# Patient Record
Sex: Male | Born: 1955
Health system: Southern US, Community
[De-identification: ages and names within clinical notes are randomized; demographics above are authoritative.]

## PROBLEM LIST (undated history)

## (undated) DIAGNOSIS — K265 Chronic or unspecified duodenal ulcer with perforation: Secondary | ICD-10-CM

## (undated) DIAGNOSIS — I2510 Atherosclerotic heart disease of native coronary artery without angina pectoris: Secondary | ICD-10-CM

## (undated) DIAGNOSIS — IMO0001 Reserved for inherently not codable concepts without codable children: Secondary | ICD-10-CM

## (undated) DIAGNOSIS — I209 Angina pectoris, unspecified: Secondary | ICD-10-CM

## (undated) DIAGNOSIS — F1410 Cocaine abuse, uncomplicated: Secondary | ICD-10-CM

## (undated) DIAGNOSIS — E7800 Pure hypercholesterolemia, unspecified: Secondary | ICD-10-CM

## (undated) DIAGNOSIS — I219 Acute myocardial infarction, unspecified: Secondary | ICD-10-CM

## (undated) DIAGNOSIS — Z720 Tobacco use: Secondary | ICD-10-CM

## (undated) DIAGNOSIS — K219 Gastro-esophageal reflux disease without esophagitis: Secondary | ICD-10-CM

## (undated) DIAGNOSIS — J449 Chronic obstructive pulmonary disease, unspecified: Secondary | ICD-10-CM

## (undated) DIAGNOSIS — Z8719 Personal history of other diseases of the digestive system: Secondary | ICD-10-CM

## (undated) DIAGNOSIS — Z872 Personal history of diseases of the skin and subcutaneous tissue: Secondary | ICD-10-CM

## (undated) DIAGNOSIS — I10 Essential (primary) hypertension: Secondary | ICD-10-CM

## (undated) HISTORY — DX: Atherosclerotic heart disease of native coronary artery without angina pectoris: I25.10

## (undated) HISTORY — PX: OTHER SURGICAL HISTORY: SHX169

---

## 1998-05-26 ENCOUNTER — Emergency Department (HOSPITAL_COMMUNITY): Admission: EM | Admit: 1998-05-26 | Discharge: 1998-05-26 | Payer: Self-pay | Admitting: Emergency Medicine

## 1998-05-26 ENCOUNTER — Encounter: Payer: Self-pay | Admitting: Emergency Medicine

## 1998-05-29 ENCOUNTER — Ambulatory Visit (HOSPITAL_BASED_OUTPATIENT_CLINIC_OR_DEPARTMENT_OTHER): Admission: RE | Admit: 1998-05-29 | Discharge: 1998-05-29 | Payer: Self-pay | Admitting: Orthopedic Surgery

## 2008-11-15 ENCOUNTER — Emergency Department (HOSPITAL_COMMUNITY): Admission: EM | Admit: 2008-11-15 | Discharge: 2008-11-15 | Payer: Self-pay | Admitting: Emergency Medicine

## 2010-07-18 ENCOUNTER — Emergency Department (HOSPITAL_COMMUNITY): Payer: Self-pay

## 2010-07-18 ENCOUNTER — Inpatient Hospital Stay (HOSPITAL_COMMUNITY)
Admission: EM | Admit: 2010-07-18 | Discharge: 2010-07-20 | DRG: 192 | Disposition: A | Discharge status: Home / Self Care | Payer: Self-pay | Attending: Internal Medicine | Admitting: Internal Medicine

## 2010-07-18 DIAGNOSIS — R062 Wheezing: Secondary | ICD-10-CM | POA: Diagnosis present

## 2010-07-18 DIAGNOSIS — F17200 Nicotine dependence, unspecified, uncomplicated: Secondary | ICD-10-CM | POA: Diagnosis present

## 2010-07-18 DIAGNOSIS — J441 Chronic obstructive pulmonary disease with (acute) exacerbation: Principal | ICD-10-CM | POA: Diagnosis present

## 2010-07-18 DIAGNOSIS — R0602 Shortness of breath: Secondary | ICD-10-CM | POA: Diagnosis present

## 2010-07-18 LAB — POCT CARDIAC MARKERS
CKMB, poc: 10.6 ng/mL (ref 1.0–8.0)
Myoglobin, poc: 152 ng/mL (ref 12–200)
Troponin i, poc: <0.05 ng/mL (ref 0.00–0.09)

## 2010-07-18 LAB — DIFFERENTIAL
Basophils Absolute: 0 10*3/uL (ref 0.0–0.1)
Basophils Relative: 1 % (ref 0–1)
Eosinophils Absolute: 0.7 10*3/uL (ref 0.0–0.7)
Eosinophils Relative: 9 % — ABNORMAL HIGH (ref 0–5)
Lymphocytes Relative: 21 % (ref 12–46)
Lymphs Abs: 1.7 10*3/uL (ref 0.7–4.0)
Monocytes Absolute: 0.4 10*3/uL (ref 0.1–1.0)
Monocytes Relative: 5 % (ref 3–12)
Neutro Abs: 5.3 10*3/uL (ref 1.7–7.7)
Neutrophils Relative %: 65 % (ref 43–77)

## 2010-07-18 LAB — BASIC METABOLIC PANEL
BUN: 7 mg/dL (ref 6–23)
CO2: 25 mEq/L (ref 19–32)
Chloride: 103 mEq/L (ref 96–112)
Potassium: 3.8 mEq/L (ref 3.5–5.1)

## 2010-07-18 LAB — CBC
HCT: 43.1 % (ref 39.0–52.0)
Hemoglobin: 14.8 g/dL (ref 13.0–17.0)
MCH: 29.6 pg (ref 26.0–34.0)
MCHC: 34.3 g/dL (ref 30.0–36.0)
MCV: 86.2 fL (ref 78.0–100.0)
RDW: 14.5 % (ref 11.5–15.5)

## 2010-07-18 LAB — URINALYSIS, ROUTINE W REFLEX MICROSCOPIC
Bilirubin Urine: NEGATIVE
Hgb urine dipstick: NEGATIVE
Ketones, ur: NEGATIVE mg/dL
Protein, ur: 30 mg/dL — AB
Urine Glucose, Fasting: NEGATIVE mg/dL
Urobilinogen, UA: 0.2 mg/dL (ref 0.0–1.0)

## 2010-07-18 LAB — CK TOTAL AND CKMB (NOT AT ARMC): Total CK: 391 U/L — ABNORMAL HIGH (ref 7–232)

## 2010-07-18 LAB — TROPONIN I: Troponin I: <0.01 ng/mL (ref 0.00–0.06)

## 2010-07-18 LAB — URINE MICROSCOPIC-ADD ON

## 2010-07-19 LAB — CARDIAC PANEL(CRET KIN+CKTOT+MB+TROPI)
Relative Index: 5.2 — ABNORMAL HIGH (ref 0.0–2.5)
Total CK: 157 U/L (ref 7–232)
Troponin I: 0.01 ng/mL (ref 0.00–0.06)

## 2010-07-26 NOTE — Discharge Summary (Signed)
  NAMECRISTON, [name]              ACCOUNT NO.:  [id number]  MEDICAL RECORD NO.:  [id number]           PATIENT TYPE:  I  LOCATION:  1408                         FACILITY:  [facility]  PHYSICIAN:  Osvaldo Shipper, MD     DATE OF BIRTH:  1955/10/11  DATE OF ADMISSION:  07/18/2010 DATE OF DISCHARGE:  07/20/2010                              DISCHARGE SUMMARY   The patient does not have a primary care physician.  No consultations obtained during this admission.  Imaging studies done include a chest x-ray which showed no active disease and showed mild hyperinflation.  PERTINENT LABS:  The CBC was normal.  His BMET was unremarkable. Initially when he came in, he had a total CK of 391 with CK-MB of 16. Troponins have all been negative.  CK-MB did come down to 8.2 with a normal CK.  DISCHARGE DIAGNOSES: 1. Acute chronic obstructive pulmonary disease exacerbation, improved. 2. Tobacco abuse.  BRIEF HOSPITAL COURSE:  Briefly, this is a 55 year old African-American male who presented to the hospital with complaints of wheezing and shortness of breath and was identified as having COPD exacerbation.  The patient was admitted to the hospital for further workup and evaluation. Chest x-ray did not show any pneumonia.  He was afebrile.  He was put on steroids, antibiotics, nebulizer treatments.  Advair was added subsequently.  He has shown significant improvement.  He is ambulated with no difficulties now.  He is saturating 92% on room air.    On examination today, the patient's lungs are clear to auscultation.  No wheezing, rales or rhonchi.  Cardiovascular, S1, S2 is normal regular. No S3, S4, rubs, murmurs, or bruit.  Abdomen is soft, nontender, nondistended.  Bowel sounds are present.  No masses or organomegaly appreciated.  Rest of the exam was unremarkable.  The patient does not have any complaints today.  He is keen on going home and he is considered stable for discharge.  DISCHARGE  MEDICATIONS: 1. Advair 250/50 inhale b.i.d. 2. Combivent inhaler 2 puffs inhale 3 times a day. 3. Moxifloxacin 400 mg p.o. daily for 4 days. 4. Nicotine patch 40 mg patch daily. 5. Prednisone taper as recommended and  6. he is on a Primatene inhaler as needed every 4 hours.  FOLLOWUP:  He has been given phone numbers for primary care providers which he can call.  He has been strongly advised to quit smoking.  DIET:  Heart-healthy.  PHYSICAL ACTIVITY:  As before without any exertion.  He may actually return to work tomorrow.  He will be given a note for work as well.     Osvaldo Shipper, MD     GK/MEDQ  D:  07/20/2010  T:  07/20/2010  Job:  [id number]  Electronically Signed by Osvaldo Shipper MD on 07/26/2010 07:49:45 PM

## 2010-08-10 NOTE — H&P (Signed)
[name], [name]              ACCOUNT NO.:  [id number]  MEDICAL RECORD NO.:  [id number]           PATIENT TYPE:  E  LOCATION:  WLED                         FACILITY:  [facility]  PHYSICIAN:  Gordy Savers, MDDATE OF BIRTH:  1955-08-29  DATE OF ADMISSION:  07/18/2010 DATE OF DISCHARGE:                             HISTORY & PHYSICAL   CHIEF COMPLAINT:  Wheezing and shortness of breath.  HISTORY OF PRESENT ILLNESS:  The patient is a 55 year old African- American male, who has a history of COPD.  This was a radiographic diagnosis based on a preemployment chest x-ray and he has never been on any maintenance pulmonary medications.  He is a one-half pack per day smoker, down from his chronic one pack per day use.  He was in his usual health until 3 days ago when he developed URI symptoms with chest congestion and cough.  Cough was initially nonproductive, but became productive of white, thick sputum and more recently over the past 24 hours, thick yellow secretions.  He has felt unwell, but was stable until this morning when he awoke wheezing and short of breath.  He became much more short of breath with wheezing when he attempted to ambulate and EMS was called.  He was treated on site with Solu-Medrol and nebulizer treatment and improved.  He was subsequently transferred to the ED for further management.  In the emergency room, he was treated with continuous nebulizer treatments with albuterol and did improve. Evaluation included cardiac markers that revealed persistent elevation in CK-MB, but normal troponin levels.  The patient does admit to cocaine use approximately 2 days prior.  After evaluation and treatment in the ED setting, the patient remained symptomatic with audible expiratory wheezing.  He is now admitted for further evaluation and management of his exacerbation of COPD as well as observation due to his elevated cardiac markers.  PAST MEDICAL HISTORY:  The patient  has a history of COPD, but has not required treatment.  He has a history of peptic ulcer disease complicated by bleeding and underwent unclear surgery in [place] in the 1970s.  He has had no subsequent admissions.  PAST SURGICAL HISTORY:  Denies any prior surgery.  MEDICAL REGIMEN:  None.  FAMILY HISTORY:  Fairly noncontributory.  Mother is age 52, history of dementia.  Father died at age 54 of lung cancer.  Two brothers, one deceased from complications of AIDS.  One sister is well.  SOCIAL HISTORY:  He is a one-half pack per day smoker.  Substance abuse includes cocaine and cannabis.  He works as a Counsellor.  He is single and never has been married.  He normally walks three to four miles three times per week without history of exertional chest pain or dyspnea.  DRUG ALLERGIES:  None.  REVIEW OF SYSTEMS:  CONSTITUTIONAL:  Denies any change in weight, fever or chills. HEENT EXAMINATION:  Positive for dentures.  No visual or hearing deficits.  No swallowing difficulties. PULMONARY:  See history of present illness.  He has developed wheezing, productive cough associated with the recent URI.  He is a one-half  pack per day smoker. CARDIAC:  No family history or personal history of cardiac disease. Denies any exertional chest pain.  Has admitted to recent cocaine use. GI:  History of peptic ulcer disease complicated by bleeding and is status post operative intervention in the 1970s. SKIN:  Unremarkable without rash. NEUROLOGICAL:  No focal symptoms.  PHYSICAL EXAMINATION:  VITAL SIGNS:  Temperature 98.2, pulse 85, respiratory rate 20, O2 saturation 95%-100%. SKIN:  Warm and dry without rash. GENERAL EXAMINATION:  Revealed a well-developed, thin male, who appeared to be in no acute distress at rest. HEENT EXAMINATION:  Revealed normal pupil responses.  Conjunctiva clear. ENT unremarkable.  Dentures in place. NECK:  Revealed no adenopathy, neck vein  distention or bruits. CHEST:  Revealed audible expiratory wheezing left greater than the right.  There is no increased work of breathing or distress. CARDIOVASCULAR EXAMINATION:  Revealed normal S1, S2 without murmur.  No tachycardia. ABDOMEN:  Midline scar.  No organomegaly or distention.  No tenderness. EXTERNAL GENITALIA:  Normal. EXTREMITIES:  Revealed no edema.  Peripheral pulses were full. NEUROLOGIC EXAMINATION:  Revealed him to be alert and oriented with normal speech.  Motor exam was normal.  Gait was normal.  LABORATORY DATA:  Laboratory studies revealed a normal white count of 8.2, hemoglobin 14.8, hematocrit 43.1.  Urinalysis normal.  Chemistries revealed normal findings.  Random blood sugar 126.  Two sets of CK levels were elevated with a total CK of 391, CK-MB 16.3 with a relative index of 4.2.  Troponin levels x2 normal.  DIAGNOSTIC STUDIES:  Chest x-ray revealed mild hyperinflation and no active disease.  EKG normal.  IMPRESSION: 1. Acute exacerbation of chronic obstructive pulmonary disease. 2. Elevated CK-MB with normal troponin levels.  This is consistent     with recent cocaine abuse. 3. Tobacco and substance abuse.  DISPOSITION:  The patient will be admitted to hospital.  He will be treated with aggressive nebulizer treatments and continue today on parenteral Solu-Medrol.  He will be treated with oral azithromycin. Tobacco and substance abuse consultation will be obtained.  Followup cardiac markers and EKG will be reviewed in the morning.     Gordy Savers, MD     PFK/MEDQ  D:  07/18/2010  T:  07/18/2010  Job:  [id number]  Electronically Signed by Eleonore Chiquito MD on 08/10/2010 12:27:05 PM

## 2011-05-18 ENCOUNTER — Encounter: Payer: Self-pay | Admitting: *Deleted

## 2011-05-18 ENCOUNTER — Inpatient Hospital Stay (HOSPITAL_COMMUNITY)
Admission: EM | Admit: 2011-05-18 | Discharge: 2011-05-23 | DRG: 192 | Disposition: A | Discharge status: Home / Self Care | Payer: Self-pay | Attending: Family Medicine | Admitting: Family Medicine

## 2011-05-18 ENCOUNTER — Emergency Department (HOSPITAL_COMMUNITY): Payer: Self-pay

## 2011-05-18 DIAGNOSIS — R509 Fever, unspecified: Secondary | ICD-10-CM

## 2011-05-18 DIAGNOSIS — Z720 Tobacco use: Secondary | ICD-10-CM | POA: Diagnosis present

## 2011-05-18 DIAGNOSIS — IMO0001 Reserved for inherently not codable concepts without codable children: Secondary | ICD-10-CM | POA: Diagnosis present

## 2011-05-18 DIAGNOSIS — J09X2 Influenza due to identified novel influenza A virus with other respiratory manifestations: Secondary | ICD-10-CM | POA: Diagnosis present

## 2011-05-18 DIAGNOSIS — J209 Acute bronchitis, unspecified: Secondary | ICD-10-CM | POA: Diagnosis present

## 2011-05-18 DIAGNOSIS — R0602 Shortness of breath: Secondary | ICD-10-CM | POA: Diagnosis present

## 2011-05-18 DIAGNOSIS — R0989 Other specified symptoms and signs involving the circulatory and respiratory systems: Secondary | ICD-10-CM | POA: Diagnosis present

## 2011-05-18 DIAGNOSIS — J441 Chronic obstructive pulmonary disease with (acute) exacerbation: Principal | ICD-10-CM | POA: Diagnosis present

## 2011-05-18 DIAGNOSIS — K279 Peptic ulcer, site unspecified, unspecified as acute or chronic, without hemorrhage or perforation: Secondary | ICD-10-CM | POA: Diagnosis present

## 2011-05-18 DIAGNOSIS — R0902 Hypoxemia: Secondary | ICD-10-CM

## 2011-05-18 DIAGNOSIS — J101 Influenza due to other identified influenza virus with other respiratory manifestations: Secondary | ICD-10-CM | POA: Diagnosis present

## 2011-05-18 DIAGNOSIS — J449 Chronic obstructive pulmonary disease, unspecified: Secondary | ICD-10-CM

## 2011-05-18 DIAGNOSIS — F17200 Nicotine dependence, unspecified, uncomplicated: Secondary | ICD-10-CM | POA: Diagnosis present

## 2011-05-18 DIAGNOSIS — R0609 Other forms of dyspnea: Secondary | ICD-10-CM | POA: Diagnosis present

## 2011-05-18 HISTORY — DX: Chronic obstructive pulmonary disease, unspecified: J44.9

## 2011-05-18 HISTORY — DX: Chronic or unspecified duodenal ulcer with perforation: K26.5

## 2011-05-18 HISTORY — DX: Personal history of diseases of the skin and subcutaneous tissue: Z87.2

## 2011-05-18 HISTORY — DX: Personal history of other diseases of the digestive system: Z87.19

## 2011-05-18 LAB — CBC
HCT: 42 % (ref 39.0–52.0)
MCHC: 35.7 g/dL (ref 30.0–36.0)
MCV: 84.3 fL (ref 78.0–100.0)
Platelets: 360 10*3/uL (ref 150–400)
RDW: 14.4 % (ref 11.5–15.5)

## 2011-05-18 LAB — DIFFERENTIAL
Basophils Absolute: 0 10*3/uL (ref 0.0–0.1)
Basophils Relative: 0 % (ref 0–1)
Eosinophils Relative: 0 % (ref 0–5)
Monocytes Absolute: 0.8 10*3/uL (ref 0.1–1.0)
Neutro Abs: 7 10*3/uL (ref 1.7–7.7)

## 2011-05-18 LAB — BASIC METABOLIC PANEL
Calcium: 9.2 mg/dL (ref 8.4–10.5)
Creatinine, Ser: 1.35 mg/dL (ref 0.50–1.35)
GFR calc Af Amer: 66 mL/min — ABNORMAL LOW (ref >90)

## 2011-05-18 MED ORDER — IPRATROPIUM BROMIDE 0.02 % IN SOLN
RESPIRATORY_TRACT | Status: AC
  Administered 2011-05-18: 19:00:00
  Filled 2011-05-18: qty 2.5

## 2011-05-18 MED ORDER — ACETAMINOPHEN 325 MG PO TABS
650.0000 mg | ORAL_TABLET | Freq: Once | ORAL | Status: AC
  Administered 2011-05-19: 650 mg via ORAL
  Filled 2011-05-18: qty 1

## 2011-05-18 MED ORDER — ALBUTEROL SULFATE (5 MG/ML) 0.5% IN NEBU
INHALATION_SOLUTION | RESPIRATORY_TRACT | Status: AC
  Filled 2011-05-18: qty 1

## 2011-05-18 MED ORDER — ALBUTEROL SULFATE (5 MG/ML) 0.5% IN NEBU
INHALATION_SOLUTION | RESPIRATORY_TRACT | Status: AC
  Administered 2011-05-18: 10 mg/h via RESPIRATORY_TRACT
  Filled 2011-05-18: qty 2

## 2011-05-18 MED ORDER — SODIUM CHLORIDE 0.9 % IV BOLUS (SEPSIS)
1000.0000 mL | Freq: Once | INTRAVENOUS | Status: AC
  Administered 2011-05-19: 1000 mL via INTRAVENOUS

## 2011-05-18 MED ORDER — METHYLPREDNISOLONE SODIUM SUCC 125 MG IJ SOLR
125.0000 mg | Freq: Once | INTRAMUSCULAR | Status: AC
  Administered 2011-05-18: 125 mg via INTRAVENOUS
  Filled 2011-05-18: qty 2

## 2011-05-18 MED ORDER — ALBUTEROL (5 MG/ML) CONTINUOUS INHALATION SOLN
10.0000 mg/h | INHALATION_SOLUTION | Freq: Once | RESPIRATORY_TRACT | Status: AC
  Administered 2011-05-18: 10 mg/h via RESPIRATORY_TRACT

## 2011-05-18 NOTE — ED Notes (Signed)
Continues to have borderline o2 sat levels @ 93 on 2 l/min.   Patient sats that this is the first time that he is not broken his RDS with the HHN's.  Lungs still with scattered rhonchi

## 2011-05-18 NOTE — ED Notes (Signed)
Continues to wait for room assignment.  Tylenol and bolus of saline given as ordered.  Breathing much better at present time

## 2011-05-18 NOTE — ED Notes (Signed)
Flu swab obtained and sent.

## 2011-05-18 NOTE — H&P (Addendum)
PCP:   None  Chief Complaint:  Shortness of breath  HPI: This is a 55 year old gentleman with known history of COPD and tobacco use, he states he woke this morning he was short of breath. He went to work the symptoms continued. He was wheezing, he was coughing. His cough is nonproductive. He reports chills and subjective fever. He was diaphoretic. He was nausea and vomiting, no hematemesis. He states he had a single episode of bowel incontinence. No abdominal pain, no altered mental status. 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 During my interview patient was short of breath using his abdominal sister muscles. He states he feels somewhat improved but still quite dyspneic.  Review of Systems: Positives bolded The patient denies anorexia, fever, weight loss,, vision loss, decreased hearing, hoarseness, chest pain, syncope, dyspnea on exertion, peripheral edema, balance deficits, hemoptysis, abdominal pain, melena, hematochezia, severe indigestion/heartburn, hematuria, incontinence, genital sores, muscle weakness, suspicious skin lesions, transient blindness, difficulty walking, depression, unusual weight change, abnormal bleeding, enlarged lymph nodes, angioedema, and breast masses.  Past Medical History: Past Medical History  Diagnosis Date  . COPD (chronic obstructive pulmonary disease)   . Stomach ulcer    Past Surgical History  Procedure Date  . Stomach sx     Medications: Prior to Admission medications   Medication Sig Start Date End Date Taking? Authorizing Provider  Fluticasone-Salmeterol (ADVAIR) 250-50 MCG/DOSE AEPB Inhale 1 puff into the lungs every 12 (twelve) hours.     Yes Historical Provider, MD    Allergies:  No Known Allergies  Social History:  reports that he has been smoking Cigarettes.  He has a 24 pack-year smoking history. He does not have any smokeless tobacco history on file. He reports that he drinks alcohol. He reports that he does not use  illicit drugs.  Family History: Family History  Problem Relation Age of Onset  . Lung cancer      Physical Exam: Filed Vitals:   05/18/11 1828 05/18/11 1859 05/18/11 1919 05/18/11 1950  BP:    151/74  Pulse:    131  Temp:    99.8 F (37.7 C)  TempSrc:    Oral  Resp:    19  SpO2: 97% 97% 94% 94%    General:  Alert and oriented times three, well developed and nourished, short of breath Eyes: PERRLA, pink conjunctiva, no scleral icterus ENT: Moist oral mucosa, neck supple, no thyromegaly, dentures  Lungs: clear to ascultation, generalized wheezing, no crackles, using abdominal accessory muscles Cardiovascular: regular rate and rhythm, no regurgitation, no gallops, no murmurs. No carotid bruits, no JVD Abdomen: soft, positive BS, non-tender, non-distended, no organomegaly, not an acute abdomen GU: not examined Neuro: CN II - XII grossly intact, sensation intact Musculoskeletal: strength 5/5 all extremities, no clubbing, cyanosis or edema Skin: no rash, no subcutaneous crepitation, no decubitus Psych: appropriate patient   Labs on Admission:   Basename 05/18/11 2030  NA 134*  K 3.6  CL 98  CO2 20  GLUCOSE 151*  BUN 15  CREATININE 1.35  CALCIUM 9.2  MG --  PHOS --   No results found for this basename: AST:2,ALT:2,ALKPHOS:2,BILITOT:2,PROT:2,ALBUMIN:2 in the last 72 hours No results found for this basename: LIPASE:2,AMYLASE:2 in the last 72 hours  Basename 05/18/11 1848  WBC 9.0  NEUTROABS 7.0  HGB 15.0  HCT 42.0  MCV 84.3  PLT 360   No results found for this basename: CKTOTAL:3,CKMB:3,CKMBINDEX:3,TROPONINI:3 in the last 72 hours No results  found for this basename: TSH,T4TOTAL,FREET3,T3FREE,THYROIDAB in the last 72 hours No results found for this basename: VITAMINB12:2,FOLATE:2,FERRITIN:2,TIBC:2,IRON:2,RETICCTPCT:2 in the last 72 hours  Radiological Exams on Admission: Dg Chest 2 View  05/18/2011  *RADIOLOGY REPORT*  Clinical Data: Chest pain and shortness of  breath.  CHEST - 2 VIEW  Comparison: Two-view chest 07/18/2010.  Findings: The heart size is normal.  Lungs are clear. Emphysematous changes are again noted.  No focal airspace disease is evident.  The visualized soft tissues and bony thorax are unremarkable.  IMPRESSION:  1.  No acute cardiopulmonary disease. 2.  Stable emphysema.  Original Report Authenticated By: Jamesetta Orleans. MATTERN, M.D.   EKG: NSR  Assessment/Plan Present on Admission:  .COPD with acute exacerbation Tobacco abuse Admit to step down overnight Oxygen ordered Solu-Medrol, nebulizers ordered  Nicotine patch Tobacco cessation education Empiric antibiotics since patient was febrile, sputum cultures ordered. MAXIMUM TEMPERATURE 101.2. Patient being evaluated for influenza, results pending. Peptic ulcer disease Protonix daily   Full code DVT prophylaxis Team 2/Dr. Darnelle Catalan   Time in 9:50 PM  Time out 10:19 PM   Anjela Cassara 05/18/2011, 10:09 PM

## 2011-05-18 NOTE — ED Provider Notes (Signed)
History     CSN: [id number] Arrival date & time: 05/18/2011  6:18 PM   First MD Initiated Contact with Patient 05/18/11 1847      Chief Complaint  Patient presents with  . Shortness of Breath    COPD exacerbation    (Consider location/radiation/quality/duration/timing/severity/associated sxs/prior treatment) HPI Comments: Patient presented today complaining of worsening shortness of breath since this morning and increasing cough.  Patient has a known history of COPD and has decreased his smoking but does continue to smoke approximately 5 cigarettes per day.  He has noted that he's had increasing cough and fevers at home.  He comes in today because he is out of his inhaler was not improving.  He does note one prior admission for COPD but no intubations.  Patient denies any chest pain, nausea, vomiting at this time.  Patient is a 55 y.o. male presenting with shortness of breath. The history is provided by the patient.  Shortness of Breath  The current episode started today. The onset was gradual. The problem occurs continuously. The problem has been gradually worsening. The problem is severe. The symptoms are relieved by nothing. The symptoms are aggravated by activity. Associated symptoms include a fever, cough, shortness of breath and wheezing. Pertinent negatives include no chest pain and no orthopnea.    Past Medical History  Diagnosis Date  . COPD (chronic obstructive pulmonary disease)   . Stomach ulcer     History reviewed. No pertinent past surgical history.  History reviewed. No pertinent family history.  History  Substance Use Topics  . Smoking status: Current Everyday Smoker -- 0.6 packs/day for 40 years    Types: Cigarettes  . Smokeless tobacco: Not on file  . Alcohol Use: Yes     Occassionally      Review of Systems  Constitutional: Positive for fever. Negative for chills.  HENT: Negative.   Eyes: Negative.  Negative for discharge and redness.  Respiratory:  Positive for cough, shortness of breath and wheezing.   Cardiovascular: Negative.  Negative for chest pain and orthopnea.  Gastrointestinal: Negative.  Negative for nausea, vomiting and abdominal pain.  Genitourinary: Negative.  Negative for hematuria.  Musculoskeletal: Negative.  Negative for back pain.  Skin: Negative.  Negative for color change and rash.  Neurological: Negative for syncope and headaches.  Hematological: Negative.  Negative for adenopathy.  Psychiatric/Behavioral: Negative.  Negative for confusion.  All other systems reviewed and are negative.    Allergies  Review of patient's allergies indicates no known allergies.  Home Medications   Current Outpatient Rx  Name Route Sig Dispense Refill  . FLUTICASONE-SALMETEROL 250-50 MCG/DOSE IN AEPB Inhalation Inhale 1 puff into the lungs every 12 (twelve) hours.        BP 151/74  Pulse 131  Temp(Src) 99.8 F (37.7 C) (Oral)  Resp 19  SpO2 94%  Physical Exam  Constitutional: He is oriented to person, place, and time. He appears well-developed and well-nourished.  Non-toxic appearance. He does not have a sickly appearance.  HENT:  Head: Normocephalic and atraumatic.  Eyes: Conjunctivae, EOM and lids are normal. Pupils are equal, round, and reactive to light.  Neck: Trachea normal, normal range of motion and full passive range of motion without pain. Neck supple.  Cardiovascular: Regular rhythm and normal heart sounds.  Tachycardia present.  Exam reveals no gallop and no friction rub.   No murmur heard. Pulmonary/Chest: No respiratory distress. He has decreased breath sounds. He has wheezes. He has no rales.  Patient with inspiratory and expiratory wheezing present on initial exam with significantly decreased breath sounds bilaterally.  Abdominal: Soft. Normal appearance. He exhibits no distension. There is no tenderness. There is no rebound and no CVA tenderness.  Musculoskeletal: Normal range of motion.    Neurological: He is alert and oriented to person, place, and time. He has normal strength.  Skin: Skin is warm, dry and intact. No rash noted.  Psychiatric: He has a normal mood and affect. His behavior is normal. Judgment and thought content normal.    ED Course  Procedures (including critical care time)  Labs Reviewed  DIFFERENTIAL - Abnormal; Notable for the following:    Neutrophils Relative 78 (*)    All other components within normal limits  BASIC METABOLIC PANEL - Abnormal; Notable for the following:    Sodium 134 (*)    Glucose, Bld 151 (*)    GFR calc non Af Amer 57 (*)    GFR calc Af Amer 66 (*)    All other components within normal limits  CBC  INFLUENZA PANEL BY PCR   Dg Chest 2 View  05/18/2011  *RADIOLOGY REPORT*  Clinical Data: Chest pain and shortness of breath.  CHEST - 2 VIEW  Comparison: Two-view chest 07/18/2010.  Findings: The heart size is normal.  Lungs are clear. Emphysematous changes are again noted.  No focal airspace disease is evident.  The visualized soft tissues and bony thorax are unremarkable.  IMPRESSION:  1.  No acute cardiopulmonary disease. 2.  Stable emphysema.  Original Report Authenticated By: Jamesetta Orleans. MATTERN, M.D.     No diagnosis found.    MDM  Patient with COPD exacerbation and possible influenza as cause for his cough and fevers.  On reevaluation after an hour-long continuous neb treatment patient is slightly improved but still has significant intertrigo return wheezing on exam and still notes that he is subjectively short of breath.  Patient is also still tachycardic to approximately 110 to 115.  His temperature is starting to decrease but is still mildly elevated.  Patient's been given solu Medrol for steroid.  He is maintaining his oxygen saturations in the mid-90s on 2 L by nasal cannula.  Given the patient's clear chest x-ray has not been given antibiotics and an influenza test has been ordered.  I discussed this with the triad  hospitalist to admit the patient to team 2 which is covered by Dr. Darnelle Catalan.        Nat Christen, MD 05/18/11 2152

## 2011-05-18 NOTE — ED Notes (Signed)
Pt states that he has COPD and ran out of his inhaler today. States that he started experiencing sx at approximately 0700 this morning. Patient with [facility] and difficulty breathing. His initial O2 saturations were 90% on RA. Patient is now on 2 liters O2 and O2 sats at 96%. Patient with inspiratory and expiratory wheezes at this time.

## 2011-05-19 ENCOUNTER — Encounter (HOSPITAL_COMMUNITY): Payer: Self-pay

## 2011-05-19 DIAGNOSIS — J101 Influenza due to other identified influenza virus with other respiratory manifestations: Secondary | ICD-10-CM | POA: Diagnosis present

## 2011-05-19 DIAGNOSIS — Z720 Tobacco use: Secondary | ICD-10-CM | POA: Diagnosis present

## 2011-05-19 LAB — CBC
Hemoglobin: 14.6 g/dL (ref 13.0–17.0)
RBC: 4.78 MIL/uL (ref 4.22–5.81)
WBC: 7.6 10*3/uL (ref 4.0–10.5)

## 2011-05-19 LAB — CREATININE, SERUM
Creatinine, Ser: 1.11 mg/dL (ref 0.50–1.35)
GFR calc Af Amer: 83 mL/min — ABNORMAL LOW (ref >90)
GFR calc non Af Amer: 72 mL/min — ABNORMAL LOW (ref >90)

## 2011-05-19 LAB — INFLUENZA PANEL BY PCR (TYPE A & B): Influenza A By PCR: POSITIVE — AB

## 2011-05-19 LAB — URINALYSIS, ROUTINE W REFLEX MICROSCOPIC
Glucose, UA: NEGATIVE mg/dL
Protein, ur: 100 mg/dL — AB

## 2011-05-19 LAB — URINE MICROSCOPIC-ADD ON

## 2011-05-19 MED ORDER — ENOXAPARIN SODIUM 40 MG/0.4ML ~~LOC~~ SOLN
40.0000 mg | SUBCUTANEOUS | Status: DC
  Administered 2011-05-19 – 2011-05-23 (×5): 40 mg via SUBCUTANEOUS
  Filled 2011-05-19 (×5): qty 0.4

## 2011-05-19 MED ORDER — NICOTINE 14 MG/24HR TD PT24
14.0000 mg | MEDICATED_PATCH | Freq: Every day | TRANSDERMAL | Status: DC
  Administered 2011-05-19 – 2011-05-23 (×5): 14 mg via TRANSDERMAL
  Filled 2011-05-19 (×5): qty 1

## 2011-05-19 MED ORDER — IPRATROPIUM BROMIDE 0.02 % IN SOLN
0.5000 mg | RESPIRATORY_TRACT | Status: DC | PRN
  Administered 2011-05-19 (×4): 0.5 mg via RESPIRATORY_TRACT
  Filled 2011-05-19 (×8): qty 2.5

## 2011-05-19 MED ORDER — ALBUTEROL SULFATE (5 MG/ML) 0.5% IN NEBU
2.5000 mg | INHALATION_SOLUTION | Freq: Four times a day (QID) | RESPIRATORY_TRACT | Status: DC
  Administered 2011-05-20 – 2011-05-23 (×15): 2.5 mg via RESPIRATORY_TRACT
  Filled 2011-05-19 (×14): qty 0.5

## 2011-05-19 MED ORDER — IPRATROPIUM BROMIDE 0.02 % IN SOLN
0.5000 mg | Freq: Four times a day (QID) | RESPIRATORY_TRACT | Status: DC
  Administered 2011-05-20 – 2011-05-23 (×15): 0.5 mg via RESPIRATORY_TRACT
  Filled 2011-05-19 (×12): qty 2.5

## 2011-05-19 MED ORDER — OSELTAMIVIR PHOSPHATE 75 MG PO CAPS
75.0000 mg | ORAL_CAPSULE | Freq: Two times a day (BID) | ORAL | Status: DC
  Administered 2011-05-19 – 2011-05-23 (×8): 75 mg via ORAL
  Filled 2011-05-19 (×10): qty 1

## 2011-05-19 MED ORDER — ALBUTEROL SULFATE (5 MG/ML) 0.5% IN NEBU
2.5000 mg | INHALATION_SOLUTION | RESPIRATORY_TRACT | Status: DC | PRN
  Administered 2011-05-18: 19:00:00 via RESPIRATORY_TRACT
  Administered 2011-05-19 (×5): 2.5 mg via RESPIRATORY_TRACT
  Filled 2011-05-19 (×6): qty 0.5

## 2011-05-19 MED ORDER — MOXIFLOXACIN HCL IN NACL 400 MG/250ML IV SOLN
400.0000 mg | INTRAVENOUS | Status: DC
  Administered 2011-05-19: 400 mg via INTRAVENOUS
  Filled 2011-05-19 (×2): qty 250

## 2011-05-19 MED ORDER — METHYLPREDNISOLONE SODIUM SUCC 40 MG IJ SOLR
40.0000 mg | Freq: Three times a day (TID) | INTRAMUSCULAR | Status: DC
  Administered 2011-05-19 – 2011-05-21 (×7): 40 mg via INTRAVENOUS
  Filled 2011-05-19 (×10): qty 1

## 2011-05-19 MED ORDER — SODIUM CHLORIDE 0.9 % IJ SOLN
3.0000 mL | INTRAMUSCULAR | Status: DC | PRN
  Administered 2011-05-21 – 2011-05-22 (×2): 3 mL via INTRAVENOUS

## 2011-05-19 MED ORDER — PANTOPRAZOLE SODIUM 40 MG PO TBEC
40.0000 mg | DELAYED_RELEASE_TABLET | Freq: Every day | ORAL | Status: DC
  Administered 2011-05-19 – 2011-05-23 (×5): 40 mg via ORAL
  Filled 2011-05-19 (×7): qty 1

## 2011-05-19 NOTE — ED Notes (Signed)
RT called. Pt stated he was standing next to the bed and became short of breath. At time of standing O2 91%.  O2 stats 98% on 3L after pt calmed down, relaxed, and took deep breaths. Hx of COPD

## 2011-05-19 NOTE — ED Notes (Signed)
Appears to be sleeping sats94

## 2011-05-19 NOTE — ED Notes (Signed)
Patient awake sts that he feels better continues in SR on 2 l O2  Via [place]. O2 sats remain @94  %

## 2011-05-19 NOTE — Progress Notes (Signed)
PATIENT DETAILS Name: [name] Age: 55 y.o. Sex: male Date of Birth: [birth year] Admit Date: 05/18/2011 [id number] VINCENT, MD  CONSULTS: None  Interval History: [name] is a 55 year old male who was admitted on 05/18/11 with a COPD exacerbation in the setting of ongoing tobacco abuse.  There was a concern for influenza, and his influenza A studies were positive.  ROS: Still short of breath, but less so than when he first arrived.  Some myalgias.  Non-productive cough.   Objective: Vital signs in last 24 hours: Temp:  [98 F (36.7 C)-101.2 F (38.4 C)] 98.4 F (36.9 C) (12/06 1701) Pulse Rate:  [96-131] 105  (12/06 1701) Resp:  [18-31] 20  (12/06 1701) BP: (123-154)/(74-94) 147/85 mmHg (12/06 1701) SpO2:  [92 %-98 %] 96 % (12/06 1701) Weight:  [86.592 kg (190 lb 14.4 oz)] 190 lb 14.4 oz (86.592 kg) (12/06 1701) Weight change:     Intake/Output from previous day:  Intake/Output Summary (Last 24 hours) at 05/19/11 1708 Last data filed at 05/19/11 1308  Gross per 24 hour  Intake    480 ml  Output      0 ml  Net    480 ml     Physical Exam:  Gen:  NAD Cardiovascular:  RRR, No M/R/G Respiratory: Lungs diminished but CTAB Gastrointestinal: Abdomen soft, NT/ND with normal active bowel sounds. Extremities: No C/E/C   Lab Results: Basic Metabolic Panel:  Lab 05/19/11 0347 05/18/11 2030  NA -- 134*  K -- 3.6  CL -- 98  CO2 -- 20  GLUCOSE -- 151*  BUN -- 15  CREATININE 1.11 1.35  CALCIUM -- 9.2  MG -- --  PHOS -- --    CBC:  Lab 05/19/11 0740 05/18/11 1848  WBC 7.6 9.0  NEUTROABS -- 7.0  HGB 14.6 15.0  HCT 40.7 42.0  MCV 85.1 84.3  PLT 260 360    Studies/Results: Dg Chest 2 View  05/18/2011  *RADIOLOGY REPORT*  Clinical Data: Chest pain and shortness of breath.  CHEST - 2 VIEW  Comparison: Two-view chest 07/18/2010.  Findings: The heart size is normal.  Lungs are clear. Emphysematous changes are again noted.  No focal airspace  disease is evident.  The visualized soft tissues and bony thorax are unremarkable.  IMPRESSION:  1.  No acute cardiopulmonary disease. 2.  Stable emphysema.  Original Report Authenticated By: Jamesetta Orleans. MATTERN, M.D.    Medications: Scheduled Meds:   . acetaminophen  650 mg Oral Once  . albuterol  10 mg/hr Nebulization Once  . enoxaparin  40 mg Subcutaneous Q24H  . ipratropium      . methylPREDNISolone (SOLU-MEDROL) injection  125 mg Intravenous Once  . methylPREDNISolone (SOLU-MEDROL) injection  40 mg Intravenous Q8H  . moxifloxacin  400 mg Intravenous Q24H  . nicotine  14 mg Transdermal Daily  . pantoprazole  40 mg Oral Q0600  . sodium chloride  1,000 mL Intravenous Once   Continuous Infusions:  PRN Meds:.albuterol, ipratropium, sodium chloride Antibiotics: Anti-infectives     Start     Dose/Rate Route Frequency Ordered Stop   05/19/11 0725   moxifloxacin (AVELOX) IVPB 400 mg        400 mg 250 mL/hr over 60 Minutes Intravenous Every 24 hours 05/19/11 0725             Assessment/Plan:  Principal Problem:  *COPD with acute exacerbation Assessment: Moderate. Plan: Continue IV steroids, d/c Avelox.  Start Tamiflu. Active Problems:  PUD (peptic  ulcer disease) Assessment: Not active Plan: Monitor for GI symptoms.  Influenza A Assessment: PCR confirmed nasal swab. Plan: Start Tamiflu.   LOS: 1 day   Hillery Aldo, MD Pager [phone]  05/19/2011, 5:08 PM

## 2011-05-19 NOTE — ED Notes (Signed)
Talked with dr Joneen Roach and that thepatien tcan be down graded to a telebed rather than a stepdown bed

## 2011-05-19 NOTE — ED Notes (Signed)
Attempted to eat a sandwich and became very short of breath resp theraphy  Notified that the patient needs a A/A tx

## 2011-05-19 NOTE — ED Notes (Signed)
Continues to sleep breathing much better since last [facility]

## 2011-05-19 NOTE — ED Notes (Signed)
Patient breathing easier after A/A resting w/o distress

## 2011-05-19 NOTE — ED Notes (Signed)
Charge RN notified that pt can be downgraded to a tele bed.

## 2011-05-19 NOTE — ED Notes (Addendum)
Dr paged to ask about what type of bed pt needed. Dr called and said that if RN thought pt was stable that pt could be downgraded in bed request from stepdown.

## 2011-05-19 NOTE — ED Notes (Signed)
Resting quietly no c/o voiced sats maintained at 94% on 2 liters O2 .

## 2011-05-19 NOTE — ED Notes (Signed)
Donnie, RT notified pt needing breathing treatment.

## 2011-05-19 NOTE — ED Notes (Signed)
RN assessed pt after breathing treatment. He reported he felt better. O2 stats 97% on 2 L/min

## 2011-05-20 LAB — BASIC METABOLIC PANEL
CO2: 24 mEq/L (ref 19–32)
GFR calc non Af Amer: 75 mL/min — ABNORMAL LOW (ref >90)
Glucose, Bld: 168 mg/dL — ABNORMAL HIGH (ref 70–99)
Potassium: 4.5 mEq/L (ref 3.5–5.1)
Sodium: 133 mEq/L — ABNORMAL LOW (ref 135–145)

## 2011-05-20 LAB — CBC
Hemoglobin: 13.8 g/dL (ref 13.0–17.0)
MCH: 29.4 pg (ref 26.0–34.0)
RBC: 4.69 MIL/uL (ref 4.22–5.81)
WBC: 9.3 10*3/uL (ref 4.0–10.5)

## 2011-05-20 LAB — EXPECTORATED SPUTUM ASSESSMENT W GRAM STAIN, RFLX TO RESP C

## 2011-05-20 MED ORDER — GUAIFENESIN ER 600 MG PO TB12
600.0000 mg | ORAL_TABLET | Freq: Two times a day (BID) | ORAL | Status: DC
  Administered 2011-05-20 – 2011-05-23 (×7): 600 mg via ORAL
  Filled 2011-05-20 (×8): qty 1

## 2011-05-20 MED ORDER — ZOLPIDEM TARTRATE 5 MG PO TABS
5.0000 mg | ORAL_TABLET | Freq: Every evening | ORAL | Status: DC | PRN
  Administered 2011-05-20 – 2011-05-21 (×2): 5 mg via ORAL
  Filled 2011-05-20 (×2): qty 1

## 2011-05-20 NOTE — Progress Notes (Signed)
PATIENT DETAILS Name: [name] Age: 55 y.o. Sex: male Date of Birth: Oct 24, 1955 Admit Date: 05/18/2011 EAV:[id number] VINCENT, MD  CONSULTS: None  Interval History: [name] is a 55 year old male who was admitted on 05/18/11 with a COPD exacerbation in the setting of ongoing tobacco abuse. There was a concern for influenza, and his influenza A studies were positive. Patient continues to have significant dyspnea.  ROS: The patient reports dyspnea overnight which interfered with his ability to sleep. He is coughing up yellow sputum. He has myalgias. Appetite is okay. Bowels are moving normally.  Objective: Vital signs in last 24 hours: Temp:  [97.8 F (36.6 C)-98.5 F (36.9 C)] 97.8 F (36.6 C) (12/07 0530) Pulse Rate:  [85-105] 93  (12/07 0530) Resp:  [18-24] 20  (12/07 0530) BP: (129-154)/(78-90) 147/84 mmHg (12/07 0530) SpO2:  [94 %-98 %] 94 % (12/07 0801) Weight:  [86.592 kg (190 lb 14.4 oz)] 190 lb 14.4 oz (86.592 kg) (12/06 1701) Weight change:  Last BM Date: 05/19/11  Intake/Output from previous day:  Intake/Output Summary (Last 24 hours) at 05/20/11 1052 Last data filed at 05/20/11 1007  Gross per 24 hour  Intake   1074 ml  Output    500 ml  Net    574 ml     Physical Exam:  Gen: NAD  Cardiovascular: RRR, No M/R/G  Respiratory: Lungs diminished but CTAB  Gastrointestinal: Abdomen soft, NT/ND with normal active bowel sounds.  Extremities: + clubbing, no edema    Lab Results: Basic Metabolic Panel:  Lab 05/20/11 2956 05/19/11 0740 05/18/11 2030  NA 133* -- 134*  K 4.5 -- 3.6  CL 100 -- 98  CO2 24 -- 20  GLUCOSE 168* -- 151*  BUN 20 -- 15  CREATININE 1.07 1.11 1.35  CALCIUM 9.6 -- 9.2  MG -- -- --  PHOS -- -- --   GFR Estimated Creatinine Clearance: 86.1 ml/min (by C-G formula based on Cr of 1.07).  CBC:  Lab 05/20/11 0446 05/19/11 0740 05/18/11 1848  WBC 9.3 7.6 9.0  NEUTROABS -- -- 7.0  HGB 13.8 14.6 15.0  HCT 40.3 40.7 42.0    MCV 85.9 85.1 84.3  PLT 275 260 360     Recent Results (from the past 240 hour(s))  CULTURE, BLOOD (ROUTINE X 2)     Status: Normal (Preliminary result)   Collection Time   05/19/11  7:30 AM      Component Value Range Status Comment   Specimen Description BLOOD LEFT HAND   Final    Special Requests BOTTLES DRAWN AEROBIC AND ANAEROBIC 10CC   Final    Setup Time [id number]   Final    Culture     Final    Value:        BLOOD CULTURE RECEIVED NO GROWTH TO DATE CULTURE WILL BE HELD FOR 5 DAYS BEFORE ISSUING A FINAL NEGATIVE REPORT   Report Status PENDING   Incomplete   CULTURE, BLOOD (ROUTINE X 2)     Status: Normal (Preliminary result)   Collection Time   05/19/11  7:40 AM      Component Value Range Status Comment   Specimen Description BLOOD LEFT ARM   Final    Special Requests BOTTLES DRAWN AEROBIC AND ANAEROBIC 10CC   Final    Setup Time [id number]   Final    Culture     Final    Value:        BLOOD CULTURE RECEIVED NO GROWTH  TO DATE CULTURE WILL BE HELD FOR 5 DAYS BEFORE ISSUING A FINAL NEGATIVE REPORT   Report Status PENDING   Incomplete     Studies/Results: Dg Chest 2 View  05/18/2011  *RADIOLOGY REPORT*  Clinical Data: Chest pain and shortness of breath.  CHEST - 2 VIEW  Comparison: Two-view chest 07/18/2010.  Findings: The heart size is normal.  Lungs are clear. Emphysematous changes are again noted.  No focal airspace disease is evident.  The visualized soft tissues and bony thorax are unremarkable.  IMPRESSION:  1.  No acute cardiopulmonary disease. 2.  Stable emphysema.  Original Report Authenticated By: Jamesetta Orleans. MATTERN, M.D.    Medications: Scheduled Meds:   . albuterol  2.5 mg Nebulization Q6H  . enoxaparin  40 mg Subcutaneous Q24H  . ipratropium  0.5 mg Nebulization Q6H  . methylPREDNISolone (SOLU-MEDROL) injection  40 mg Intravenous Q8H  . nicotine  14 mg Transdermal Daily  . oseltamivir  75 mg Oral BID  . pantoprazole  40 mg Oral Q0600  . DISCONTD:  moxifloxacin  400 mg Intravenous Q24H   Continuous Infusions:  PRN Meds:.albuterol, ipratropium, sodium chloride Antibiotics: Anti-infectives     Start     Dose/Rate Route Frequency Ordered Stop   05/19/11 2200   oseltamivir (TAMIFLU) capsule 75 mg        75 mg Oral 2 times daily 05/19/11 1720 05/24/11 2159   05/19/11 0725   moxifloxacin (AVELOX) IVPB 400 mg  Status:  Discontinued        400 mg 250 mL/hr over 60 Minutes Intravenous Every 24 hours 05/19/11 0725 05/19/11 1724           Assessment/Plan: Principal Problem:  *COPD with acute exacerbation  Assessment: Moderate.  Plan: Continue IV steroids, d/c'd  Avelox on 05/19/11 and started Tamiflu. Add Mucinex. Active Problems:  PUD (peptic ulcer disease)  Assessment: Not active  Plan: Monitor for GI symptoms.  Influenza A  Assessment: PCR confirmed nasal swab.  Plan: Started Tamiflu on 05/19/11 (give through 05/24/11).    LOS: 2 days   Hillery Aldo, MD Pager [phone]  05/20/2011, 10:52 AM

## 2011-05-21 LAB — BASIC METABOLIC PANEL
CO2: 27 mEq/L (ref 19–32)
Calcium: 9.2 mg/dL (ref 8.4–10.5)
Chloride: 102 mEq/L (ref 96–112)
Glucose, Bld: 151 mg/dL — ABNORMAL HIGH (ref 70–99)
Sodium: 137 mEq/L (ref 135–145)

## 2011-05-21 LAB — CBC
HCT: 38.7 % — ABNORMAL LOW (ref 39.0–52.0)
Hemoglobin: 13 g/dL (ref 13.0–17.0)
MCH: 29.1 pg (ref 26.0–34.0)
MCV: 86.8 fL (ref 78.0–100.0)
Platelets: 258 10*3/uL (ref 150–400)
RBC: 4.46 MIL/uL (ref 4.22–5.81)
WBC: 7.1 10*3/uL (ref 4.0–10.5)

## 2011-05-21 MED ORDER — METHYLPREDNISOLONE SODIUM SUCC 40 MG IJ SOLR
40.0000 mg | Freq: Two times a day (BID) | INTRAMUSCULAR | Status: DC
  Administered 2011-05-21 – 2011-05-23 (×4): 40 mg via INTRAVENOUS
  Filled 2011-05-21 (×6): qty 1

## 2011-05-21 NOTE — Progress Notes (Signed)
PATIENT DETAILS Name: [name] Age: 55 y.o. Sex: male Date of Birth: 1955-11-12 Admit Date: 05/18/2011 ONG:[id number] VINCENT, MD  CONSULTS: None  Interval History: [name] is a 55 year old male who was admitted on 05/18/11 with a COPD exacerbation in the setting of ongoing tobacco abuse. There was a concern for influenza, and his influenza A studies were positive. Patient continues to have significant dyspnea.  ROS: [name] continues to have significant dyspnea and activity intolerance.  He has a cough productive of yellow sputum.     Objective: Vital signs in last 24 hours: Temp:  [97.4 F (36.3 C)-98.4 F (36.9 C)] 97.4 F (36.3 C) (12/08 0614) Pulse Rate:  [84-98] 84  (12/08 0614) Resp:  [18-20] 18  (12/08 0614) BP: (128-146)/(46-79) 128/79 mmHg (12/08 0614) SpO2:  [95 %-99 %] 96 % (12/08 0931) Weight change:  Last BM Date: 05/21/11  Intake/Output from previous day:  Intake/Output Summary (Last 24 hours) at 05/21/11 1033 Last data filed at 05/21/11 0500  Gross per 24 hour  Intake    240 ml  Output    900 ml  Net   -660 ml     Physical Exam:  Gen: NAD  Cardiovascular: RRR, No M/R/G  Respiratory: Lungs diminished but CTAB  Gastrointestinal: Abdomen soft, NT/ND with normal active bowel sounds.  Extremities: + clubbing, no edema    Lab Results: Basic Metabolic Panel:  Lab 05/21/11 0102 05/20/11 0446 05/19/11 0740 05/18/11 2030  NA 137 133* -- 134*  K 4.6 4.5 -- --  CL 102 100 -- 98  CO2 27 24 -- 20  GLUCOSE 151* 168* -- 151*  BUN 20 20 -- 15  CREATININE 1.03 1.07 1.11 1.35  CALCIUM 9.2 9.6 -- 9.2  MG -- -- -- --  PHOS -- -- -- --   GFR Estimated Creatinine Clearance: 89.4 ml/min (by C-G formula based on Cr of 1.03).  CBC:  Lab 05/21/11 0430 05/20/11 0446 05/19/11 0740 05/18/11 1848  WBC 7.1 9.3 7.6 9.0  NEUTROABS -- -- -- 7.0  HGB 13.0 13.8 14.6 15.0  HCT 38.7* 40.3 40.7 42.0  MCV 86.8 85.9 85.1 84.3  PLT 258 275 260 360      Recent Results (from the past 240 hour(s))  CULTURE, BLOOD (ROUTINE X 2)     Status: Normal (Preliminary result)   Collection Time   05/19/11  7:30 AM      Component Value Range Status Comment   Specimen Description BLOOD LEFT HAND   Final    Special Requests BOTTLES DRAWN AEROBIC AND ANAEROBIC 10CC   Final    Setup Time [id number]   Final    Culture     Final    Value:        BLOOD CULTURE RECEIVED NO GROWTH TO DATE CULTURE WILL BE HELD FOR 5 DAYS BEFORE ISSUING A FINAL NEGATIVE REPORT   Report Status PENDING   Incomplete   CULTURE, BLOOD (ROUTINE X 2)     Status: Normal (Preliminary result)   Collection Time   05/19/11  7:40 AM      Component Value Range Status Comment   Specimen Description BLOOD LEFT ARM   Final    Special Requests BOTTLES DRAWN AEROBIC AND ANAEROBIC 10CC   Final    Setup Time [id number]   Final    Culture     Final    Value:        BLOOD CULTURE RECEIVED NO GROWTH TO DATE CULTURE  WILL BE HELD FOR 5 DAYS BEFORE ISSUING A FINAL NEGATIVE REPORT   Report Status PENDING   Incomplete   CULTURE, SPUTUM-ASSESSMENT     Status: Normal   Collection Time   05/20/11 12:23 PM      Component Value Range Status Comment   Specimen Description SPUTUM   Final    Special Requests NONE   Final    Sputum evaluation     Final    Value: MICROSCOPIC FINDINGS SUGGEST THAT THIS SPECIMEN IS NOT REPRESENTATIVE OF LOWER RESPIRATORY SECRETIONS. PLEASE RECOLLECT.     E HARLESS AT 1340 ON 12.07.2012 BY NBROOKS   Report Status 05/20/2011 FINAL   Final   CULTURE, SPUTUM-ASSESSMENT     Status: Normal   Collection Time   05/20/11 10:21 PM      Component Value Range Status Comment   Specimen Description SPUTUM   Final    Special Requests Normal   Final    Sputum evaluation     Final    Value: THIS SPECIMEN IS ACCEPTABLE. RESPIRATORY CULTURE REPORT TO FOLLOW.   Report Status 05/20/2011 FINAL   Final   CULTURE, RESPIRATORY     Status: Normal (Preliminary result)   Collection Time    05/20/11 10:21 PM      Component Value Range Status Comment   Specimen Description SPUTUM   Final    Special Requests NONE   Final    Gram Stain     Final    Value: FEW WBC PRESENT, PREDOMINANTLY PMN     FEW SQUAMOUS EPITHELIAL CELLS PRESENT     MODERATE GRAM POSITIVE COCCI IN PAIRS     IN CLUSTERS FEW GRAM NEGATIVE RODS     RARE GRAM POSITIVE RODS   Culture PENDING   Incomplete    Report Status PENDING   Incomplete     Studies/Results: No results found.  Medications: Scheduled Meds:   . albuterol  2.5 mg Nebulization Q6H  . enoxaparin  40 mg Subcutaneous Q24H  . guaiFENesin  600 mg Oral BID  . ipratropium  0.5 mg Nebulization Q6H  . methylPREDNISolone (SOLU-MEDROL) injection  40 mg Intravenous Q8H  . nicotine  14 mg Transdermal Daily  . oseltamivir  75 mg Oral BID  . pantoprazole  40 mg Oral Q0600   Continuous Infusions:  PRN Meds:.albuterol, ipratropium, sodium chloride, zolpidem Antibiotics: Anti-infectives     Start     Dose/Rate Route Frequency Ordered Stop   05/19/11 2200   oseltamivir (TAMIFLU) capsule 75 mg        75 mg Oral 2 times daily 05/19/11 1720 05/24/11 2159   05/19/11 0725   moxifloxacin (AVELOX) IVPB 400 mg  Status:  Discontinued        400 mg 250 mL/hr over 60 Minutes Intravenous Every 24 hours 05/19/11 0725 05/19/11 1724           Assessment/Plan:  Principal Problem:  *COPD with acute exacerbation  Assessment: Moderate.  Plan: Begin to wean IV steroids, d/c'd Avelox on 05/19/11 and started Tamiflu. Added Mucinex 05/21/11. F/U sputum culture. Active Problems:  PUD (peptic ulcer disease)  Assessment: Not active  Plan: Monitor for GI symptoms.  Influenza A  Assessment: PCR confirmed nasal swab.  Plan: Started Tamiflu on 05/19/11 (give through 05/24/11). Tobacco Abuse Assessment: Chronic/active Plan: Counseled.  Continue nicotine patch.  Provide cessation education.    LOS: 3 days   Johnny Aldo, MD Pager [phone]  05/21/2011,  10:33 AM

## 2011-05-22 MED ORDER — MOXIFLOXACIN HCL 400 MG PO TABS
400.0000 mg | ORAL_TABLET | Freq: Every day | ORAL | Status: DC
  Administered 2011-05-22: 400 mg via ORAL
  Filled 2011-05-22 (×2): qty 1

## 2011-05-22 NOTE — Progress Notes (Signed)
PATIENT DETAILS Name: [name] Age: 55 y.o. Sex: male Date of Birth: 04/01/1956 Admit Date: 05/18/2011 ZOX:[id number] VINCENT, MD  CONSULTS: None  Interval History: [name] is a 55 year old male who was admitted on 05/18/11 with a COPD exacerbation in the setting of ongoing tobacco abuse. There was a concern for influenza, and his influenza A studies were positive. He is beginning to have some improvement in his dyspnea.  ROS: Improving dyspnea, able to tolerate activities a little better.  Has a cough productive of thick, purulent sputum.  No fever.   Objective: Vital signs in last 24 hours: Temp:  [97.8 F (36.6 C)-98.6 F (37 C)] 97.8 F (36.6 C) (12/09 0446) Pulse Rate:  [88-98] 88  (12/09 0446) Resp:  [20-22] 20  (12/09 0446) BP: (121-156)/(65-88) 121/65 mmHg (12/09 0446) SpO2:  [96 %-99 %] 96 % (12/09 0810) Weight change:  Last BM Date: 05/22/11  Intake/Output from previous day:  Intake/Output Summary (Last 24 hours) at 05/22/11 0846 Last data filed at 05/22/11 0750  Gross per 24 hour  Intake    366 ml  Output   1525 ml  Net  -1159 ml     Physical Exam:  Gen: NAD  Cardiovascular: RRR, No M/R/G  Respiratory: Lungs diminished with a course expiratory wheeze. Gastrointestinal: Abdomen soft, NT/ND with normal active bowel sounds.  Extremities: + clubbing, no edema    Lab Results: Basic Metabolic Panel:  Lab 05/21/11 1914 05/20/11 0446 05/19/11 0740 05/18/11 2030  NA 137 133* -- 134*  K 4.6 4.5 -- --  CL 102 100 -- 98  CO2 27 24 -- 20  GLUCOSE 151* 168* -- 151*  BUN 20 20 -- 15  CREATININE 1.03 1.07 1.11 1.35  CALCIUM 9.2 9.6 -- 9.2  MG -- -- -- --  PHOS -- -- -- --   GFR Estimated Creatinine Clearance: 89.4 ml/min (by C-G formula based on Cr of 1.03).  CBC:  Lab 05/21/11 0430 05/20/11 0446 05/19/11 0740 05/18/11 1848  WBC 7.1 9.3 7.6 9.0  NEUTROABS -- -- -- 7.0  HGB 13.0 13.8 14.6 15.0  HCT 38.7* 40.3 40.7 42.0  MCV 86.8 85.9  85.1 84.3  PLT 258 275 260 360     Recent Results (from the past 240 hour(s))  CULTURE, BLOOD (ROUTINE X 2)     Status: Normal (Preliminary result)   Collection Time   05/19/11  7:30 AM      Component Value Range Status Comment   Specimen Description BLOOD LEFT HAND   Final    Special Requests BOTTLES DRAWN AEROBIC AND ANAEROBIC 10CC   Final    Setup Time [id number]   Final    Culture     Final    Value:        BLOOD CULTURE RECEIVED NO GROWTH TO DATE CULTURE WILL BE HELD FOR 5 DAYS BEFORE ISSUING A FINAL NEGATIVE REPORT   Report Status PENDING   Incomplete   CULTURE, BLOOD (ROUTINE X 2)     Status: Normal (Preliminary result)   Collection Time   05/19/11  7:40 AM      Component Value Range Status Comment   Specimen Description BLOOD LEFT ARM   Final    Special Requests BOTTLES DRAWN AEROBIC AND ANAEROBIC 10CC   Final    Setup Time [id number]   Final    Culture     Final    Value:        BLOOD CULTURE RECEIVED NO  GROWTH TO DATE CULTURE WILL BE HELD FOR 5 DAYS BEFORE ISSUING A FINAL NEGATIVE REPORT   Report Status PENDING   Incomplete   CULTURE, SPUTUM-ASSESSMENT     Status: Normal   Collection Time   05/20/11 12:23 PM      Component Value Range Status Comment   Specimen Description SPUTUM   Final    Special Requests NONE   Final    Sputum evaluation     Final    Value: MICROSCOPIC FINDINGS SUGGEST THAT THIS SPECIMEN IS NOT REPRESENTATIVE OF LOWER RESPIRATORY SECRETIONS. PLEASE RECOLLECT.     E HARLESS AT 1340 ON 12.07.2012 BY NBROOKS   Report Status 05/20/2011 FINAL   Final   CULTURE, SPUTUM-ASSESSMENT     Status: Normal   Collection Time   05/20/11 10:21 PM      Component Value Range Status Comment   Specimen Description SPUTUM   Final    Special Requests Normal   Final    Sputum evaluation     Final    Value: THIS SPECIMEN IS ACCEPTABLE. RESPIRATORY CULTURE REPORT TO FOLLOW.   Report Status 05/20/2011 FINAL   Final   CULTURE, RESPIRATORY     Status: Normal (Preliminary  result)   Collection Time   05/20/11 10:21 PM      Component Value Range Status Comment   Specimen Description SPUTUM   Final    Special Requests NONE   Final    Gram Stain     Final    Value: FEW WBC PRESENT, PREDOMINANTLY PMN     FEW SQUAMOUS EPITHELIAL CELLS PRESENT     MODERATE GRAM POSITIVE COCCI IN PAIRS     IN CLUSTERS FEW GRAM NEGATIVE RODS     RARE GRAM POSITIVE RODS   Culture PENDING   Incomplete    Report Status PENDING   Incomplete     Studies/Results: No results found.  Medications: Scheduled Meds:   . albuterol  2.5 mg Nebulization Q6H  . enoxaparin  40 mg Subcutaneous Q24H  . guaiFENesin  600 mg Oral BID  . ipratropium  0.5 mg Nebulization Q6H  . methylPREDNISolone (SOLU-MEDROL) injection  40 mg Intravenous Q12H  . nicotine  14 mg Transdermal Daily  . oseltamivir  75 mg Oral BID  . pantoprazole  40 mg Oral Q0600  . DISCONTD: methylPREDNISolone (SOLU-MEDROL) injection  40 mg Intravenous Q8H   Continuous Infusions:  PRN Meds:.albuterol, ipratropium, sodium chloride, zolpidem Antibiotics: Anti-infectives     Start     Dose/Rate Route Frequency Ordered Stop   05/19/11 2200   oseltamivir (TAMIFLU) capsule 75 mg        75 mg Oral 2 times daily 05/19/11 1720 05/24/11 2159   05/19/11 0725   moxifloxacin (AVELOX) IVPB 400 mg  Status:  Discontinued        400 mg 250 mL/hr over 60 Minutes Intravenous Every 24 hours 05/19/11 0725 05/19/11 1724           Assessment/Plan: Principal Problem:  *COPD with acute exacerbation  Assessment: Moderate.  Plan: Began to wean IV steroids 05/21/11 but would not wean further today.  D/c'd Avelox on 05/19/11 but would re-start, given high risk for bacterial superinfection. Tamiflu started on 05/19/11. Added Mucinex 05/21/11. F/U sputum culture. Check oxygen saturation with ambulation. Active Problems:  PUD (peptic ulcer disease)  Assessment: Not active  Plan: Monitor for GI symptoms.  Influenza A  Assessment: PCR confirmed  nasal swab.  Plan: Started Tamiflu on 05/19/11 (give through  05/24/11).  Tobacco Abuse  Assessment: Chronic/active  Plan: Counseled. Continue nicotine patch. Provide cessation education.   LOS: 4 days   Hillery Aldo, MD Pager [phone]  05/22/2011, 8:46 AM

## 2011-05-22 NOTE — Progress Notes (Signed)
Ambulated pt on RA per MD order. Pt ambulated about 58ft and did become short of breath with the exertion but otherwise he tolorated the walk well. Pt stated that he felt better than he has been feeling. Pt's oxygen saturations on RA while ambulating were 97-98%. Will continue to monitor pt's oxygen status. [name]

## 2011-05-23 LAB — CULTURE, RESPIRATORY W GRAM STAIN: Culture: NORMAL

## 2011-05-23 MED ORDER — OSELTAMIVIR PHOSPHATE 75 MG PO CAPS: 75.0000 mg | ORAL_CAPSULE | Freq: Two times a day (BID) | ORAL | Status: AC

## 2011-05-23 MED ORDER — AZITHROMYCIN 250 MG PO TABS: ORAL_TABLET | ORAL | Status: DC

## 2011-05-23 MED ORDER — PREDNISONE (PAK) 10 MG PO TABS: ORAL_TABLET | ORAL | Status: AC

## 2011-05-23 MED ORDER — AZITHROMYCIN 1 G PO PACK: 1.0000 | PACK | Freq: Once | ORAL | Status: DC

## 2011-05-23 MED ORDER — AZITHROMYCIN 250 MG PO TABS: ORAL_TABLET | ORAL | Status: AC

## 2011-05-23 MED ORDER — NICOTINE 14 MG/24HR TD PT24: 1.0000 | MEDICATED_PATCH | Freq: Every day | TRANSDERMAL | Status: AC

## 2011-05-23 MED ORDER — GUAIFENESIN ER 600 MG PO TB12: 600.0000 mg | ORAL_TABLET | Freq: Two times a day (BID) | ORAL | Status: AC

## 2011-05-23 NOTE — Progress Notes (Signed)
05-23-11 Spoke with patient bedside who reports feeling a lot better. Will need med assistance thru indigent funds. Will need two sets of prescriptions so I can get one filled from our pharmacy. Pt qualifies for indigent funds. Pt is indep. Off oxygen currently. Also f/u appt with [facility] for 07-03-10 at 1015 for eligibility. Dr appt with Dala Dock will be with Dr. Sherryll Burger on 07-28-11. Made referral for [facility] last week. Pt will likely be able to obtain earlier appts if he calls Dorothy with [facility] after dc. No further needs assessed. Paged MD to make aware of the need for 2 sets of RX for med assistance. Pharmacy discount card given to patient as well as [email or web address] website info.   [address] RN,BSN, [place] [phone]

## 2011-05-23 NOTE — Discharge Summary (Addendum)
Physician Discharge Summary  Patient ID: [name] MRN: [id number] DOB/AGE: 1956/05/28 55 y.o.  Admit date: 05/18/2011 Discharge date: 05/23/2011  Primary Care Physician:  Burtis Junes, MD, Dr. Norberto Sorenson at [facility].   Discharge Diagnoses:    Present on Admission:  .COPD with acute exacerbation .Influenza A .Tobacco abuse  Discharge Medications:  Current Discharge Medication List    START taking these medications   Details  azithromycin (ZITHROMAX Z-PAK) 250 MG tablet Take 2 pills today, then 1 pill daily until gone. Qty: 6 each, Refills: 0    guaiFENesin (MUCINEX) 600 MG 12 hr tablet Take 1 tablet (600 mg total) by mouth 2 (two) times daily.    nicotine (NICODERM CQ - DOSED IN MG/24 HOURS) 14 mg/24hr patch Place 1 patch onto the skin daily. Qty: 14 patch, Refills: 0    oseltamivir (TAMIFLU) 75 MG capsule Take 1 capsule (75 mg total) by mouth 2 (two) times daily. Qty: 4 capsule, Refills: 0    predniSONE (STERAPRED UNI-PAK) 10 MG tablet Take as directed. Qty: 21 tablet, Refills: 0      CONTINUE these medications which have NOT CHANGED   Details  Fluticasone-Salmeterol (ADVAIR) 250-50 MCG/DOSE AEPB Inhale 1 puff into the lungs every 12 (twelve) hours.           Disposition and Follow-up: The patient is being d/c'd home.  He has follow-up scheduled at [facility] as noted on his d/c instructions.  Consults:   Significant Diagnostic Studies:  Dg Chest 2 View  05/18/2011   IMPRESSION:  1.  No acute cardiopulmonary disease. 2.  Stable emphysema.  Original Report Authenticated By: Jamesetta Orleans. MATTERN, M.D.    Discharge Laboratory Values: Basic Metabolic Panel:  Lab 05/21/11 4098 05/20/11 0446 05/19/11 0740 05/18/11 2030  NA 137 133* -- 134*  K 4.6 4.5 -- --  CL 102 100 -- 98  CO2 27 24 -- 20  GLUCOSE 151* 168* -- 151*  BUN 20 20 -- 15  CREATININE 1.03 1.07 1.11 1.35  CALCIUM 9.2 9.6 -- 9.2  MG -- -- -- --  PHOS -- -- -- --    GFR Estimated Creatinine Clearance: 89.4 ml/min (by C-G formula based on Cr of 1.03).  CBC:  Lab 05/21/11 0430 05/20/11 0446 05/19/11 0740 05/18/11 1848  WBC 7.1 9.3 7.6 9.0  NEUTROABS -- -- -- 7.0  HGB 13.0 13.8 14.6 15.0  HCT 38.7* 40.3 40.7 42.0  MCV 86.8 85.9 85.1 84.3  PLT 258 275 260 360   Brief H and P: For complete details please refer to admission H and P, but in brief, [name] is a 55 year old male who was admitted on 05/18/11 with a COPD exacerbation in the setting of ongoing tobacco abuse. There was a concern for influenza, and his influenza A studies were positive.    Physical Exam at Discharge: BP 151/80  Pulse 94  Temp(Src) 98.9 F (37.2 C) (Oral)  Resp 20  Ht 6\' 1"  (1.854 m)  Wt 86.592 kg (190 lb 14.4 oz)  BMI 25.19 kg/m2  SpO2 97% Gen: NAD  Cardiovascular: RRR, No M/R/G  Respiratory: Lungs diminished, no wheeze. Gastrointestinal: Abdomen soft, NT/ND with normal active bowel sounds.  Extremities: + clubbing, no edema   Hospital Course:  Principal Problem:  *COPD with acute exacerbation The patient was admitted and put on Avelox and IV steroids.  We began to wean IV steroids 05/21/11. D/c'd Avelox on 05/19/11 but re-started on 05/22/11 due to ongoing symptoms  and given high risk for bacterial superinfection. Tamiflu was started on 05/19/11. We dded Mucinex 05/21/11.  He has clinically improved over the past 24 hours and the plan is to d/c him home today with appropriate follow-up at [facility].  We will be obtaining a z-pack and prednisone dose pack from the indigent funds program to assist with medication compliance.  Active Problems:  Influenza A The patient's influenza A screen was positive.  He was put on Tamiflu, and will complete a 5 day course of treatment.   Tobacco abuse The patient was provided with a nicotine patch and counseled regarding the importance of cessation.   Time spent on Discharge: 35 minutes.  Signed: Dr. Trula Ore  Khoury Siemon Pager [phone] 05/23/2011, 12:44 PM

## 2011-05-25 LAB — CULTURE, BLOOD (ROUTINE X 2)
Culture  Setup Time: 201212061104
Culture: NO GROWTH

## 2013-10-26 ENCOUNTER — Inpatient Hospital Stay (HOSPITAL_COMMUNITY)
Admission: EM | Admit: 2013-10-26 | Discharge: 2013-11-05 | DRG: 234 | Disposition: A | Discharge status: Skilled Nursing Facility | Payer: No Typology Code available for payment source | Attending: Thoracic Surgery (Cardiothoracic Vascular Surgery) | Admitting: Thoracic Surgery (Cardiothoracic Vascular Surgery)

## 2013-10-26 ENCOUNTER — Emergency Department (HOSPITAL_COMMUNITY): Payer: No Typology Code available for payment source

## 2013-10-26 ENCOUNTER — Encounter (HOSPITAL_COMMUNITY): Payer: Self-pay | Admitting: Emergency Medicine

## 2013-10-26 DIAGNOSIS — Z720 Tobacco use: Secondary | ICD-10-CM | POA: Diagnosis present

## 2013-10-26 DIAGNOSIS — I200 Unstable angina: Secondary | ICD-10-CM

## 2013-10-26 DIAGNOSIS — K279 Peptic ulcer, site unspecified, unspecified as acute or chronic, without hemorrhage or perforation: Secondary | ICD-10-CM | POA: Diagnosis present

## 2013-10-26 DIAGNOSIS — E7800 Pure hypercholesterolemia, unspecified: Secondary | ICD-10-CM | POA: Diagnosis present

## 2013-10-26 DIAGNOSIS — D62 Acute posthemorrhagic anemia: Secondary | ICD-10-CM | POA: Diagnosis not present

## 2013-10-26 DIAGNOSIS — G4700 Insomnia, unspecified: Secondary | ICD-10-CM | POA: Diagnosis present

## 2013-10-26 DIAGNOSIS — E785 Hyperlipidemia, unspecified: Secondary | ICD-10-CM | POA: Diagnosis present

## 2013-10-26 DIAGNOSIS — J9819 Other pulmonary collapse: Secondary | ICD-10-CM | POA: Diagnosis present

## 2013-10-26 DIAGNOSIS — Z951 Presence of aortocoronary bypass graft: Secondary | ICD-10-CM

## 2013-10-26 DIAGNOSIS — J4489 Other specified chronic obstructive pulmonary disease: Secondary | ICD-10-CM | POA: Diagnosis present

## 2013-10-26 DIAGNOSIS — I10 Essential (primary) hypertension: Secondary | ICD-10-CM | POA: Diagnosis present

## 2013-10-26 DIAGNOSIS — R079 Chest pain, unspecified: Secondary | ICD-10-CM

## 2013-10-26 DIAGNOSIS — Z23 Encounter for immunization: Secondary | ICD-10-CM

## 2013-10-26 DIAGNOSIS — J449 Chronic obstructive pulmonary disease, unspecified: Secondary | ICD-10-CM | POA: Diagnosis present

## 2013-10-26 DIAGNOSIS — I252 Old myocardial infarction: Secondary | ICD-10-CM

## 2013-10-26 DIAGNOSIS — K5900 Constipation, unspecified: Secondary | ICD-10-CM | POA: Diagnosis present

## 2013-10-26 DIAGNOSIS — R9431 Abnormal electrocardiogram [ECG] [EKG]: Secondary | ICD-10-CM

## 2013-10-26 DIAGNOSIS — Z79899 Other long term (current) drug therapy: Secondary | ICD-10-CM

## 2013-10-26 DIAGNOSIS — E8779 Other fluid overload: Secondary | ICD-10-CM | POA: Diagnosis present

## 2013-10-26 DIAGNOSIS — I214 Non-ST elevation (NSTEMI) myocardial infarction: Principal | ICD-10-CM | POA: Diagnosis present

## 2013-10-26 DIAGNOSIS — F1010 Alcohol abuse, uncomplicated: Secondary | ICD-10-CM | POA: Diagnosis present

## 2013-10-26 DIAGNOSIS — I2510 Atherosclerotic heart disease of native coronary artery without angina pectoris: Secondary | ICD-10-CM | POA: Diagnosis present

## 2013-10-26 DIAGNOSIS — F1410 Cocaine abuse, uncomplicated: Secondary | ICD-10-CM | POA: Diagnosis present

## 2013-10-26 DIAGNOSIS — I249 Acute ischemic heart disease, unspecified: Secondary | ICD-10-CM | POA: Diagnosis present

## 2013-10-26 DIAGNOSIS — R5381 Other malaise: Secondary | ICD-10-CM | POA: Diagnosis present

## 2013-10-26 DIAGNOSIS — F17200 Nicotine dependence, unspecified, uncomplicated: Secondary | ICD-10-CM | POA: Diagnosis present

## 2013-10-26 HISTORY — DX: Essential (primary) hypertension: I10

## 2013-10-26 HISTORY — DX: Cocaine abuse, uncomplicated: F14.10

## 2013-10-26 HISTORY — DX: Pure hypercholesterolemia, unspecified: E78.00

## 2013-10-26 HISTORY — DX: Tobacco use: Z72.0

## 2013-10-26 LAB — BASIC METABOLIC PANEL
BUN: 10 mg/dL (ref 6–23)
CALCIUM: 9.2 mg/dL (ref 8.4–10.5)
CHLORIDE: 105 meq/L (ref 96–112)
CO2: 23 meq/L (ref 19–32)
Creatinine, Ser: 1.05 mg/dL (ref 0.50–1.35)
GFR calc Af Amer: 88 mL/min — ABNORMAL LOW (ref >90)
GFR calc non Af Amer: 76 mL/min — ABNORMAL LOW (ref >90)
Glucose, Bld: 80 mg/dL (ref 70–99)
POTASSIUM: 4.2 meq/L (ref 3.7–5.3)
SODIUM: 142 meq/L (ref 137–147)

## 2013-10-26 LAB — CBC
HCT: 37.2 % — ABNORMAL LOW (ref 39.0–52.0)
Hemoglobin: 12.7 g/dL — ABNORMAL LOW (ref 13.0–17.0)
MCH: 29.8 pg (ref 26.0–34.0)
MCHC: 34.1 g/dL (ref 30.0–36.0)
MCV: 87.3 fL (ref 78.0–100.0)
PLATELETS: 286 10*3/uL (ref 150–400)
RBC: 4.26 MIL/uL (ref 4.22–5.81)
RDW: 15.3 % (ref 11.5–15.5)
WBC: 8.3 10*3/uL (ref 4.0–10.5)

## 2013-10-26 LAB — RAPID URINE DRUG SCREEN, HOSP PERFORMED
Amphetamines: NOT DETECTED
Barbiturates: NOT DETECTED
Benzodiazepines: NOT DETECTED
Cocaine: POSITIVE — AB
OPIATES: NOT DETECTED
Tetrahydrocannabinol: NOT DETECTED

## 2013-10-26 LAB — LIPID PANEL
CHOL/HDL RATIO: 2.9 ratio
CHOLESTEROL: 220 mg/dL — AB (ref 0–200)
HDL: 76 mg/dL (ref >39)
LDL CALC: 130 mg/dL — AB (ref 0–99)
TRIGLYCERIDES: 68 mg/dL (ref <150)
VLDL: 14 mg/dL (ref 0–40)

## 2013-10-26 LAB — PRO B NATRIURETIC PEPTIDE: PRO B NATRI PEPTIDE: 327.4 pg/mL — AB (ref 0–125)

## 2013-10-26 LAB — I-STAT TROPONIN, ED: TROPONIN I, POC: 0.05 ng/mL (ref 0.00–0.08)

## 2013-10-26 LAB — TROPONIN I
Troponin I: 0.37 ng/mL (ref <0.30)
Troponin I: 0.65 ng/mL (ref <0.30)
Troponin I: 0.95 ng/mL (ref <0.30)

## 2013-10-26 MED ORDER — MOMETASONE FURO-FORMOTEROL FUM 100-5 MCG/ACT IN AERO
2.0000 | INHALATION_SPRAY | Freq: Two times a day (BID) | RESPIRATORY_TRACT | Status: DC
  Administered 2013-10-26 – 2013-10-30 (×10): 2 via RESPIRATORY_TRACT
  Filled 2013-10-26: qty 8.8

## 2013-10-26 MED ORDER — NICOTINE 21 MG/24HR TD PT24
21.0000 mg | MEDICATED_PATCH | Freq: Every day | TRANSDERMAL | Status: DC
  Administered 2013-10-26 – 2013-10-30 (×5): 21 mg via TRANSDERMAL
  Filled 2013-10-26 (×7): qty 1

## 2013-10-26 MED ORDER — ASPIRIN EC 81 MG PO TBEC
81.0000 mg | DELAYED_RELEASE_TABLET | Freq: Every day | ORAL | Status: DC
  Administered 2013-10-27 – 2013-10-30 (×4): 81 mg via ORAL
  Filled 2013-10-26 (×6): qty 1

## 2013-10-26 MED ORDER — ALBUTEROL SULFATE (2.5 MG/3ML) 0.083% IN NEBU: 2.5000 mg | INHALATION_SOLUTION | Freq: Four times a day (QID) | RESPIRATORY_TRACT | Status: DC | PRN

## 2013-10-26 MED ORDER — NITROGLYCERIN 0.4 MG SL SUBL: 0.4000 mg | SUBLINGUAL_TABLET | SUBLINGUAL | Status: DC | PRN

## 2013-10-26 MED ORDER — ATORVASTATIN CALCIUM 40 MG PO TABS
40.0000 mg | ORAL_TABLET | Freq: Every day | ORAL | Status: DC
  Administered 2013-10-26 – 2013-10-28 (×3): 40 mg via ORAL
  Filled 2013-10-26 (×4): qty 1

## 2013-10-26 MED ORDER — ENOXAPARIN SODIUM 100 MG/ML ~~LOC~~ SOLN
95.0000 mg | Freq: Two times a day (BID) | SUBCUTANEOUS | Status: DC
  Administered 2013-10-26: 11:00:00 via SUBCUTANEOUS
  Administered 2013-10-26 – 2013-10-27 (×3): 95 mg via SUBCUTANEOUS
  Filled 2013-10-26 (×8): qty 1

## 2013-10-26 MED ORDER — PNEUMOCOCCAL VAC POLYVALENT 25 MCG/0.5ML IJ INJ
0.5000 mL | INJECTION | INTRAMUSCULAR | Status: AC
  Administered 2013-10-27: 0.5 mL via INTRAMUSCULAR
  Filled 2013-10-26: qty 0.5

## 2013-10-26 MED ORDER — METOPROLOL TARTRATE 12.5 MG HALF TABLET
12.5000 mg | ORAL_TABLET | Freq: Two times a day (BID) | ORAL | Status: DC
  Administered 2013-10-26 (×2): 12.5 mg via ORAL
  Filled 2013-10-26 (×6): qty 1

## 2013-10-26 MED ORDER — ALBUTEROL SULFATE HFA 108 (90 BASE) MCG/ACT IN AERS: 1.0000 | INHALATION_SPRAY | Freq: Four times a day (QID) | RESPIRATORY_TRACT | Status: DC | PRN

## 2013-10-26 NOTE — ED Notes (Signed)
Pt arrived by [facility] for onset of central mid chest pressure approx 1 hour ago.pain radiated into left arm. Pain decreased after 3 nitro and 324mg  asa pta.

## 2013-10-26 NOTE — ED Provider Notes (Signed)
CSN: [id number]     Arrival date & time 10/26/13  0450 History   First MD Initiated Contact with Patient 10/26/13 [phone]     Chief Complaint  Patient presents with  . Chest Pain     (Consider location/radiation/quality/duration/timing/severity/associated sxs/prior Treatment) HPI 58 year old male presents to emergency room from home with complaint of chest pain.  Pain woke him from sleep about an hour ago.  Chest pain is central, described as a heavy pressure.  Pain radiated down his left arm.  Patient felt that he had to shake his arm to get the pain to go away.  Patient denies previous history of similar pain.  Patient felt slightly sweaty with the pain.  No nausea, no shortness of breath.  Patient has history of COPD, hypertension, elevated cholesterol.  No family history of coronary disease.  He is a smoker.  Patient reports resolution of pain after nitroglycerin from EMS. Past Medical History  Diagnosis Date  . COPD (chronic obstructive pulmonary disease)   . Perforation of duodenal ulcer   . H/O: GI bleed   . H/O: eczema     lower extremities  . High cholesterol    Past Surgical History  Procedure Laterality Date  . Stomach sx     Family History  Problem Relation Age of Onset  . Lung cancer     History  Substance Use Topics  . Smoking status: Current Every Day Smoker -- 0.60 packs/day for 40 years    Types: Cigarettes  . Smokeless tobacco: Never Used  . Alcohol Use: Yes     Comment: Occassionally    Review of Systems   See History of Present Illness; otherwise all other systems are reviewed and negative  Allergies  Review of patient's allergies indicates no known allergies.  Home Medications   Prior to Admission medications   Medication Sig Start Date End Date Taking? Authorizing Provider  albuterol (PROAIR HFA) 108 (90 BASE) MCG/ACT inhaler Inhale 1 puff into the lungs every 6 (six) hours as needed for wheezing or shortness of breath.   Yes Historical Provider, MD   Fluticasone-Salmeterol (ADVAIR) 250-50 MCG/DOSE AEPB Inhale 1 puff into the lungs every 12 (twelve) hours.     Yes Historical Provider, MD   BP 145/85  Pulse 82  Temp(Src) 97.8 F (36.6 C) (Oral)  Resp 18  Ht 6' (1.829 m)  Wt 210 lb (95.255 kg)  BMI 28.47 kg/m2  SpO2 96% Physical Exam  Nursing note and vitals reviewed. Constitutional: He is oriented to person, place, and time. He appears well-developed and well-nourished. No distress.  HENT:  Head: Normocephalic and atraumatic.  Right Ear: External ear normal.  Left Ear: External ear normal.  Nose: Nose normal.  Mouth/Throat: Oropharynx is clear and moist.  Eyes: Conjunctivae and EOM are normal. Pupils are equal, round, and reactive to light.  Neck: Normal range of motion. Neck supple. No JVD present. No tracheal deviation present. No thyromegaly present.  Cardiovascular: Normal rate, regular rhythm, normal heart sounds and intact distal pulses.  Exam reveals no gallop and no friction rub.   No murmur heard. Pulmonary/Chest: Effort normal and breath sounds normal. No stridor. No respiratory distress. He has no wheezes. He has no rales. He exhibits no tenderness.  Abdominal: Soft. Bowel sounds are normal. He exhibits no distension and no mass. There is no tenderness. There is no rebound and no guarding.  Musculoskeletal: Normal range of motion. He exhibits no edema and no tenderness.  Lymphadenopathy:  He has no cervical adenopathy.  Neurological: He is alert and oriented to person, place, and time. He exhibits normal muscle tone. Coordination normal.  Skin: Skin is warm and dry. No rash noted. No erythema. No pallor.  Psychiatric: He has a normal mood and affect. His behavior is normal. Judgment and thought content normal.    ED Course  Procedures (including critical care time) Labs Review Labs Reviewed  CBC - Abnormal; Notable for the following:    Hemoglobin 12.7 (*)    HCT 37.2 (*)    All other components within normal  limits  BASIC METABOLIC PANEL - Abnormal; Notable for the following:    GFR calc non Af Amer 76 (*)    GFR calc Af Amer 88 (*)    All other components within normal limits  PRO B NATRIURETIC PEPTIDE - Abnormal; Notable for the following:    Pro B Natriuretic peptide (BNP) 327.4 (*)    All other components within normal limits  TROPONIN I  I-STAT TROPOININ, ED    Imaging Review Dg Chest 2 View  10/26/2013   CLINICAL DATA:  Chest pain and pressure, shortness of breath.  EXAM: CHEST  2 VIEW  COMPARISON:  DG CHEST 2 VIEW dated 05/18/2011  FINDINGS: Cardiomediastinal silhouette is unremarkable. The lungs are clear without pleural effusions or focal consolidations. Right lung base linear densities. Similarly increased lung volumes, with flattening of the hemidiaphragms. Trachea projects midline and there is no pneumothorax. Soft tissue planes and included osseous structures are non-suspicious. Inferred lumbar dextroscoliosis.  IMPRESSION: COPD with right lung base atelectasis versus scarring.   Electronically Signed   By: Elon Alas   On: 10/26/2013 05:26     EKG Interpretation   Date/Time:  Saturday Oct 26 2013 04:54:54 EDT Ventricular Rate:  80 PR Interval:  166 QRS Duration: 94 QT Interval:  395 QTC Calculation: 456 R Axis:   5 Text Interpretation:  Sinus rhythm Repol abnrm suggests ischemia, diffuse  leads ST depressions new from prior Confirmed by Kenetra Hildenbrand  MD, Rhoderick Farrel ([id number])  on 10/26/2013 5:04:09 AM      MDM   Final diagnoses:  Chest pain  Abnormal EKG    58 year old male with chest pain.  He has EKG with concerning ST depression which is new from prior.  Troponin at this time is negative.  Pain improved with nitroglycerin.  He has risk factors for ACS.  Given EKG changes, will discuss with cardiology for their evaluation.    Kalman Drape, MD 10/26/13 [phone]

## 2013-10-26 NOTE — Consult Note (Signed)
Pharmacy Note-Anticoagulation  Pharmacy Consult :  58 y.o. male is to be started on Full Dose Lovenos for ACS/NSTEMI/CP.   Dosing Wt :  95 kg  Hematology :  Recent Labs  10/26/13 0502  HGB 12.7*  HCT 37.2*  PLT 286  CREATININE 1.05   Estimated Creatinine Clearance: 90.8 ml/min (by C-G formula based on Cr of 1.05).   Current Medication[s] Include: Medication PTA: Prescriptions prior to admission  Medication Sig Dispense Refill  . albuterol (PROAIR HFA) 108 (90 BASE) MCG/ACT inhaler Inhale 1 puff into the lungs every 6 (six) hours as needed for wheezing or shortness of breath.      . Fluticasone-Salmeterol (ADVAIR) 250-50 MCG/DOSE AEPB Inhale 1 puff into the lungs every 12 (twelve) hours.          Scheduled:  . [START ON 10/27/2013] aspirin EC  81 mg Oral Daily  . atorvastatin  40 mg Oral q1800  . metoprolol tartrate  12.5 mg Oral BID  . mometasone-formoterol  2 puff Inhalation BID   Assessment :  59 y/o male with no previous cardiac history to be started on Full Dose Lovenox for ACS/NSTEMI/CP.  Patient has a history of a perforated Duodenal Ulcer and GI Bleed.  Lovenox will be started 1mg /kg/q 12hrs, no renal adjustment required  No evidence of active bleeding observed.  Goal :  Lovenox Anti-Xa level 0.6-1 units/ml 4hrs after LMWH dose given  Plan : 1. Begin Lovenox 95 mg sq q 12 hours. 2. Daily CBC due to GI Bleeding history.   Monitor for bleeding complications.   Follow Platelet counts.  Tamaiya Bump, Craig Guess,  Pharm.D  10/26/2013  8:46 AM

## 2013-10-26 NOTE — Progress Notes (Signed)
CRITICAL VALUE ALERT  Critical value received: troponin 0.37  Date of notification:  10/26/13  Time of notification: 0920  Critical value read back:yes  Nurse who received alert:  Albertine Grates RN  MD notified (1st page):  Marlou Porch  Time of first page:  0921  MD notified (2nd page):  Time of second page:  Responding MD:  Marlou Porch  Time MD responded:  [phone]

## 2013-10-26 NOTE — Progress Notes (Addendum)
Agree with Dr. Elias Else.  Concerning ECG findings, ST segment depression. Chest pain, tobacco use, hyperlipidemia, hypertension.  Lovenox, metoprolol. Aspirin. Atorvastatin.  Patient agreeable with plan.  Hemoglobin 12.7.  Cardiac catheterization Monday. N.p.o. on Sunday.  ADDENDUM: 10:28am Troponin now abnormal 0.37. No change in plans.  Candee Furbish, MD

## 2013-10-26 NOTE — ED Notes (Signed)
Cardio at bedside

## 2013-10-26 NOTE — ED Notes (Signed)
Returned from xray and placed back on monitor.  

## 2013-10-26 NOTE — H&P (Signed)
History and Physical  Patient ID: BAIN WHICHARD MRN: [id number], SOB: 10-17-1955 58 y.o. Date of Encounter: 10/26/2013, 6:55 AM  Primary Physician: Elizabeth Palau, MD Primary Cardiologist: unassigned  Chief Complaint: chest pain  HPI: 58 y.o. male w/ PMHx significant for tobacco abuse, HTN, hyperlipidemia who presented to [facility] on 10/26/2013 with complaints of chest pain.  This AM, around 400 am, awoke due to the sudden onset of chest pressure that felt like an pressure on his chest.  Radiated to his left arm. +Anxious. +Diaphoretic. +SOB. No nausea or vomiting (though he vomitted the night before due to his baked ziti). Called 911 and pain did not improve until he was given aspirin and nitro by EMS.   Currently chest pain free. Last night, drank 1/2 pint of commercial moonshine. Denies h/o withdrawal. Denies illicits.  Reports no cardiac history. No DOE or exertional chest pain. No LE swelling, no PND or orthopnea.   EKG revealed NSR with inferolateral flat ST depressions, new as compared to prior. CXR was without acute cardiopulmonary abnormalities, old scarring and COPD changes. Labs are significant for negative initial troponin. BNP of 327.   Past Medical History  Diagnosis Date  . COPD (chronic obstructive pulmonary disease)   . Perforation of duodenal ulcer   . H/O: GI bleed   . H/O: eczema     lower extremities  . High cholesterol      Surgical History:  Past Surgical History  Procedure Laterality Date  . Stomach sx       Home Meds: Prior to Admission medications   Medication Sig Start Date End Date Taking? Authorizing Provider  albuterol (PROAIR HFA) 108 (90 BASE) MCG/ACT inhaler Inhale 1 puff into the lungs every 6 (six) hours as needed for wheezing or shortness of breath.   Yes Historical Provider, MD  Fluticasone-Salmeterol (ADVAIR) 250-50 MCG/DOSE AEPB Inhale 1 puff into the lungs every 12 (twelve) hours.     Yes Historical Provider, MD     Allergies: No Known Allergies  History   Social History  . Marital Status: Single    Spouse Name: N/A    Number of Children: N/A  . Years of Education: N/A   Occupational History  . Not on file.   Social History Main Topics  . Smoking status: Current Every Day Smoker -- 0.60 packs/day for 40 years    Types: Cigarettes  . Smokeless tobacco: Never Used  . Alcohol Use: Yes     Comment: Occassionally  . Drug Use: Yes    Special: Cocaine, Marijuana  . Sexual Activity: Yes    Birth Control/ Protection: None   Other Topics Concern  . Not on file   Social History Narrative  . No narrative on file     Family History  Problem Relation Age of Onset  . Lung cancer      Review of Systems: General: negative for chills, fever, night sweats or weight changes.  Cardiovascular: see HPI Dermatological: negative for rash Respiratory: negative for cough or wheezing Urologic: negative for hematuria Abdominal: negative for nausea, vomiting, diarrhea, bright red blood per rectum, melena, or hematemesis Neurologic: negative for visual changes, syncope, or dizziness All other systems reviewed and are otherwise negative except as noted above.  Labs:   Lab Results  Component Value Date   WBC 8.3 10/26/2013   HGB 12.7* 10/26/2013   HCT 37.2* 10/26/2013   MCV 87.3 10/26/2013   PLT 286 10/26/2013    Recent Labs Lab 10/26/13  0502  NA 142  K 4.2  CL 105  CO2 23  BUN 10  CREATININE 1.05  CALCIUM 9.2  GLUCOSE 80    Recent Labs  10/26/13 0502  TROPONINI <0.30   No results found for this basename: CHOL, HDL, LDLCALC, TRIG   No results found for this basename: DDIMER    Radiology/Studies:  Dg Chest 2 View  10/26/2013   CLINICAL DATA:  Chest pain and pressure, shortness of breath.  EXAM: CHEST  2 VIEW  COMPARISON:  DG CHEST 2 VIEW dated 05/18/2011  FINDINGS: Cardiomediastinal silhouette is unremarkable. The lungs are clear without pleural effusions or focal consolidations.  Right lung base linear densities. Similarly increased lung volumes, with flattening of the hemidiaphragms. Trachea projects midline and there is no pneumothorax. Soft tissue planes and included osseous structures are non-suspicious. Inferred lumbar dextroscoliosis.  IMPRESSION: COPD with right lung base atelectasis versus scarring.   Electronically Signed   By: Elon Alas   On: 10/26/2013 05:26     EKG: see HPI  Physical Exam: Blood pressure 145/85, pulse 82, temperature 97.8 F (36.6 C), temperature source Oral, resp. rate 18, height 6' (1.829 m), weight 95.255 kg (210 lb), SpO2 96.00%. General: Well developed, well nourished, in no acute distress. Head: Normocephalic, atraumatic, sclera non-icteric, nares are without discharge Neck: Supple. Negative for carotid bruits. JVD not elevated. Lungs: clear but diffusely decreased breath sounds Heart: RRR with S1 S2. No murmurs, rubs, or gallops appreciated. Abdomen: Soft, non-tender, non-distended with normoactive bowel sounds. No rebound/guarding. No obvious abdominal masses. Msk:  Strength and tone appear normal for age. Extremities: No edema. No clubbing or cyanosis. Distal pedal pulses are 2+ and equal bilaterally. Neuro: Alert and oriented X 3. Moves all extremities spontaneously. Psych:  Responds to questions appropriately with a normal affect.    ASSESSMENT AND PLAN:  Problem LIst 1. Chest pain with concerning EKG changes --> acute coronary syndrome 2. HTN 3. Excessive ETOH 4. Continued tobacco abuse 5. COPD 6. Hyperlipidemia  58 y.o. male w/ PMHx significant for tobacco abuse, HTN, hyperlipidemia who presented to [facility] on 10/26/2013 with complaints of chest pain with typical features and concerning EKG changes but now chest pain free.  Due to good story for CAD, risk factors and EKG changes, will heparinize with lovenox with anticipated cath on Monday. Serial troponins which i anticipate will turn positive.  Encouragingly, is chest pain free now.  Low dose beta blocker (not wheezing currently), statin, aspirin.  Continue COPD meds. Strongly urged smoking cessation though he is currently pre-contemplative.  Excessive ETOH though he reports that it is just on the weeks. Low threshold for CIWA if concerned. Urine drug screen to evaluate for other substances though he clearly stated that he does not use cocaine (noted in his chart history).  Full code. NPO for now though likely can eat later pending plan. On lovenox.    Signed, Cletus Gash MD 10/26/2013, 6:55 AM

## 2013-10-26 NOTE — ED Notes (Signed)
Patient transported to X-ray 

## 2013-10-27 DIAGNOSIS — E7800 Pure hypercholesterolemia, unspecified: Secondary | ICD-10-CM | POA: Diagnosis present

## 2013-10-27 DIAGNOSIS — F17200 Nicotine dependence, unspecified, uncomplicated: Secondary | ICD-10-CM

## 2013-10-27 DIAGNOSIS — I214 Non-ST elevation (NSTEMI) myocardial infarction: Secondary | ICD-10-CM | POA: Diagnosis present

## 2013-10-27 LAB — CBC
HCT: 38.5 % — ABNORMAL LOW (ref 39.0–52.0)
Hemoglobin: 13 g/dL (ref 13.0–17.0)
MCH: 29.3 pg (ref 26.0–34.0)
MCHC: 33.8 g/dL (ref 30.0–36.0)
MCV: 86.9 fL (ref 78.0–100.0)
PLATELETS: 291 10*3/uL (ref 150–400)
RBC: 4.43 MIL/uL (ref 4.22–5.81)
RDW: 15.1 % (ref 11.5–15.5)
WBC: 5.7 10*3/uL (ref 4.0–10.5)

## 2013-10-27 MED ORDER — SODIUM CHLORIDE 0.9 % IV SOLN: 250.0000 mL | INTRAVENOUS | Status: DC | PRN

## 2013-10-27 MED ORDER — SODIUM CHLORIDE 0.9 % IJ SOLN: 3.0000 mL | Freq: Two times a day (BID) | INTRAMUSCULAR | Status: DC

## 2013-10-27 MED ORDER — SODIUM CHLORIDE 0.9 % IV SOLN
1.0000 mL/kg/h | INTRAVENOUS | Status: DC
  Administered 2013-10-27: 1 mL/kg/h via INTRAVENOUS

## 2013-10-27 MED ORDER — SODIUM CHLORIDE 0.9 % IJ SOLN: 3.0000 mL | INTRAMUSCULAR | Status: DC | PRN

## 2013-10-27 MED ORDER — METOPROLOL TARTRATE 25 MG PO TABS
25.0000 mg | ORAL_TABLET | Freq: Two times a day (BID) | ORAL | Status: DC
  Administered 2013-10-27 (×2): 25 mg via ORAL
  Filled 2013-10-27 (×4): qty 1

## 2013-10-27 MED ORDER — ASPIRIN 81 MG PO CHEW
81.0000 mg | CHEWABLE_TABLET | ORAL | Status: AC
  Administered 2013-10-28: 81 mg via ORAL
  Filled 2013-10-27: qty 1

## 2013-10-27 MED ORDER — ANGIOPLASTY BOOK
Freq: Once | Status: AC
  Administered 2013-10-27: 07:00:00
  Filled 2013-10-27: qty 1

## 2013-10-27 NOTE — Progress Notes (Signed)
Informed Dr. Radford Pax Cardiology of elevated troponin 0.95 at 2020 on 10/26/13.  Pt denies chest pain.  Scheduled for routine EKG this morning.  Vitals stable.  On full dose lovenox 95 mg.  No further orders at this time.  Will continue to monitor.  Iantha Fallen RN 4:23 AM 10/27/2013

## 2013-10-27 NOTE — Progress Notes (Addendum)
    Subjective:  No complaints, no chest pain, sitting up in bed eating breakfast.  Objective:  Vital Signs in the last 24 hours: Temp:  [97.6 F (36.4 C)-97.9 F (36.6 C)] 97.9 F (36.6 C) (05/17 0603) Pulse Rate:  [65-84] 65 (05/17 0603) Resp:  [16-19] 16 (05/17 0603) BP: (131-151)/(69-90) 131/69 mmHg (05/17 0603) SpO2:  [93 %-100 %] 93 % (05/17 0820)  Intake/Output from previous day: 05/16 0701 - 05/17 0700 In: 600 [P.O.:600] Out: -    Physical Exam: General: Well developed, well nourished, in no acute distress. Head:  Normocephalic and atraumatic. Lungs: Clear to auscultation and percussion. Heart: Normal S1 and S2.  No murmur, rubs or gallops.  Abdomen: soft, non-tender, positive bowel sounds. Extremities: No clubbing or cyanosis. No edema. Normal radial pulse Neurologic: Alert and oriented x 3.    Lab Results:  Recent Labs  10/26/13 0502 10/27/13 0517  WBC 8.3 5.7  HGB 12.7* 13.0  PLT 286 291    Recent Labs  10/26/13 0502  NA 142  K 4.2  CL 105  CO2 23  GLUCOSE 80  BUN 10  CREATININE 1.05    Recent Labs  10/26/13 1545 10/26/13 2020  TROPONINI 0.65* 0.95*    Recent Labs  10/26/13 0920  CHOL 220*   No results found for this basename: PROTIME,  in the last 72 hours  Imaging: Dg Chest 2 View  10/26/2013   CLINICAL DATA:  Chest pain and pressure, shortness of breath.  EXAM: CHEST  2 VIEW  COMPARISON:  DG CHEST 2 VIEW dated 05/18/2011  FINDINGS: Cardiomediastinal silhouette is unremarkable. The lungs are clear without pleural effusions or focal consolidations. Right lung base linear densities. Similarly increased lung volumes, with flattening of the hemidiaphragms. Trachea projects midline and there is no pneumothorax. Soft tissue planes and included osseous structures are non-suspicious. Inferred lumbar dextroscoliosis.  IMPRESSION: COPD with right lung base atelectasis versus scarring.   Electronically Signed   By: Elon Alas   On:  10/26/2013 05:26   Personally viewed.   Telemetry: No VT, adverse arrhythmias Personally viewed.   EKG:  Sinus rhythm with T wave inversion in the lateral precordial leads, ischemia  . aspirin EC  81 mg Oral Daily  . atorvastatin  40 mg Oral q1800  . enoxaparin  95 mg Subcutaneous Q12H  . metoprolol tartrate  12.5 mg Oral BID  . mometasone-formoterol  2 puff Inhalation BID  . nicotine  21 mg Transdermal Daily  . pneumococcal 23 valent vaccine  0.5 mL Intramuscular Tomorrow-1000   Assessment/Plan:   58 year old smoker, hypertension, hyperlipidemia, non-ST elevation myocardial infarction, lateral ST segment depression.  1. Non-ST elevation myocardial infarction-catheterization tomorrow. Risks of stroke, heart attack, bleeding, renal impairment, arrhythmia described to patient. Good radial artery candidate. Mildly elevated troponin. Understands possibility of PCI/bypass. I will increase metoprolol to 25 mg twice a day. Lovenox full dose  2. Alcohol use-no signs of withdrawal.  3. Tobacco use-cessation, nicotine patch  4. Hyperlipidemia - atorvastatin 40.    Candee Furbish 10/27/2013, 8:27 AM

## 2013-10-28 ENCOUNTER — Encounter (HOSPITAL_COMMUNITY)
Admission: EM | Disposition: A | Discharge status: Skilled Nursing Facility | Payer: Self-pay | Source: Home / Self Care | Attending: Thoracic Surgery (Cardiothoracic Vascular Surgery)

## 2013-10-28 ENCOUNTER — Encounter (HOSPITAL_COMMUNITY): Payer: Self-pay | Admitting: Physician Assistant

## 2013-10-28 ENCOUNTER — Other Ambulatory Visit: Payer: Self-pay | Admitting: *Deleted

## 2013-10-28 DIAGNOSIS — J449 Chronic obstructive pulmonary disease, unspecified: Secondary | ICD-10-CM

## 2013-10-28 DIAGNOSIS — I2510 Atherosclerotic heart disease of native coronary artery without angina pectoris: Secondary | ICD-10-CM

## 2013-10-28 DIAGNOSIS — E785 Hyperlipidemia, unspecified: Secondary | ICD-10-CM

## 2013-10-28 HISTORY — PX: LEFT HEART CATHETERIZATION WITH CORONARY ANGIOGRAM: SHX5451

## 2013-10-28 LAB — CBC
HCT: 37 % — ABNORMAL LOW (ref 39.0–52.0)
Hemoglobin: 12.5 g/dL — ABNORMAL LOW (ref 13.0–17.0)
MCH: 29.5 pg (ref 26.0–34.0)
MCHC: 33.8 g/dL (ref 30.0–36.0)
MCV: 87.3 fL (ref 78.0–100.0)
Platelets: 291 10*3/uL (ref 150–400)
RBC: 4.24 MIL/uL (ref 4.22–5.81)
RDW: 15.3 % (ref 11.5–15.5)
WBC: 6.5 10*3/uL (ref 4.0–10.5)

## 2013-10-28 LAB — HEPATIC FUNCTION PANEL
ALT: 12 U/L (ref 0–53)
AST: 13 U/L (ref 0–37)
Albumin: 3.2 g/dL — ABNORMAL LOW (ref 3.5–5.2)
Alkaline Phosphatase: 105 U/L (ref 39–117)
Total Bilirubin: 0.4 mg/dL (ref 0.3–1.2)
Total Protein: 6.3 g/dL (ref 6.0–8.3)

## 2013-10-28 LAB — PROTIME-INR
INR: 0.9 (ref 0.00–1.49)
Prothrombin Time: 12 seconds (ref 11.6–15.2)

## 2013-10-28 SURGERY — LEFT HEART CATHETERIZATION WITH CORONARY ANGIOGRAM
Anesthesia: LOCAL

## 2013-10-28 MED ORDER — LIDOCAINE HCL (PF) 1 % IJ SOLN
INTRAMUSCULAR | Status: AC
  Filled 2013-10-28: qty 30

## 2013-10-28 MED ORDER — PANTOPRAZOLE SODIUM 40 MG PO TBEC
40.0000 mg | DELAYED_RELEASE_TABLET | Freq: Every day | ORAL | Status: DC
  Administered 2013-10-28 – 2013-10-31 (×4): 40 mg via ORAL
  Filled 2013-10-28 (×4): qty 1

## 2013-10-28 MED ORDER — HEPARIN SODIUM (PORCINE) 1000 UNIT/ML IJ SOLN
INTRAMUSCULAR | Status: AC
  Filled 2013-10-28: qty 1

## 2013-10-28 MED ORDER — MIDAZOLAM HCL 2 MG/2ML IJ SOLN
INTRAMUSCULAR | Status: AC
  Filled 2013-10-28: qty 2

## 2013-10-28 MED ORDER — FENTANYL CITRATE 0.05 MG/ML IJ SOLN
INTRAMUSCULAR | Status: AC
  Filled 2013-10-28: qty 2

## 2013-10-28 MED ORDER — HEPARIN (PORCINE) IN NACL 2-0.9 UNIT/ML-% IJ SOLN
INTRAMUSCULAR | Status: AC
  Filled 2013-10-28: qty 1000

## 2013-10-28 MED ORDER — HEPARIN (PORCINE) IN NACL 100-0.45 UNIT/ML-% IJ SOLN
1150.0000 [IU]/h | INTRAMUSCULAR | Status: DC
  Administered 2013-10-29: 1150 [IU]/h via INTRAVENOUS
  Administered 2013-10-29: 950 [IU]/h via INTRAVENOUS
  Administered 2013-10-29 – 2013-10-30 (×2): 1150 [IU]/h via INTRAVENOUS
  Filled 2013-10-28 (×5): qty 250

## 2013-10-28 MED ORDER — VERAPAMIL HCL 2.5 MG/ML IV SOLN
INTRAVENOUS | Status: AC
  Filled 2013-10-28: qty 2

## 2013-10-28 MED ORDER — SODIUM CHLORIDE 0.9 % IV SOLN: INTRAVENOUS | Status: AC

## 2013-10-28 NOTE — CV Procedure (Signed)
      Cardiac Catheterization Operative Report  EMRIC KOWALEWSKI [id number] 5/18/20151:48 PM Elizabeth Palau, MD  Procedure Performed:  1. Left Heart Catheterization 2. Selective Coronary Angiography 3. Left ventricular angiogram  Operator: Lauree Chandler, MD  Arterial access site:  Right radial artery.   Indication: 58 yo male with history of cocaine use, HTN, HLD, tobacco abuse admitted with NSTEMI. UDS positive for cocaine.                                    Procedure Details: The risks, benefits, complications, treatment options, and expected outcomes were discussed with the patient. The patient and/or family concurred with the proposed plan, giving informed consent. The patient was brought to the cath lab after IV hydration was begun and oral premedication was given. The patient was further sedated with Versed and Fentanyl. The right wrist was assessed with an Allens test which was positive. The right wrist was prepped and draped in a sterile fashion. 1% lidocaine was used for local anesthesia. Using the modified Seldinger access technique, a 5 French sheath was placed in the right radial artery. 3 mg Verapamil was given through the sheath. 5000 units IV heparin was given. Standard diagnostic catheters were used to perform selective coronary angiography. A pigtail catheter was used to perform a left ventricular angiogram. The sheath was removed from the right radial artery and a Terumo hemostasis band was applied at the arteriotomy site on the right wrist.   There were no immediate complications. The patient was taken to the recovery area in stable condition.   Hemodynamic Findings: Central aortic pressure: 120/69 Left ventricular pressure: 124/12/16  Angiographic Findings:  Left main: No obstructive disease.   Left Anterior Descending Artery: Large caliber vessel that courses to the apex. The proximal has long hazy 80% stenosis. There is mild plaque in the mid and  distal vessel. Small caliber first diagonal 70% stenosis.   Circumflex Artery: Large caliber vessel with proximal 99% stenosis. The first obtuse marginal branch is moderate in caliber with no obstructive disease. The second obtuse marginal branch is moderate in caliber with proximal 70% stenosis. The distal AV groove Circumflex has 50% stenosis.   Right Coronary Artery: Large dominant vessel with diffuse 40% mid stenosis followed by a focal 70-80% mid stenosis. The distal vessel has mild plaque disease. The PDA is a moderate caliber vessel with serial 40% stenoses.   Left Ventricular Angiogram: LVEF=55-60%  Impression: 1. NSTEMI 2. Severe triple vessel CAD 3. History of questionable medical compliance, substance abuse (cocaine) 4. Normal LV function  Recommendations: Given triple vessel CAD involving the proximal LAD, proximal Circumflex and RCA along with questionable compliance and recent cocaine use, I think CABG is the best revascularization option. All of his lesions could be stented as most severe disease is fairly focal but I do not think multiple drug eluting stents is a safe option in this patient given his compliance and substance abuse issues. Will ask CT surgery to see and review his films. He seems to be a good candidate for CABG. Continue ASA, statin. His beta blocker has been held with UDS positive for cocaine.        Complications:  None. The patient tolerated the procedure well.

## 2013-10-28 NOTE — Interval H&P Note (Signed)
History and Physical Interval Note:  10/28/2013 1:15 PM  [name]  has presented today for cardiac cath with the diagnosis of chest pain  The various methods of treatment have been discussed with the patient and family. After consideration of risks, benefits and other options for treatment, the patient has consented to  Procedure(s): LEFT HEART CATHETERIZATION WITH CORONARY ANGIOGRAM (N/A) as a surgical intervention .  The patient's history has been reviewed, patient examined, no change in status, stable for surgery.  I have reviewed the patient's chart and labs.  Questions were answered to the patient's satisfaction.    Cath Lab Visit (complete for each Cath Lab visit)  Clinical Evaluation Leading to the Procedure:   ACS: yes  Non-ACS:    Anginal Classification: CCS IV  Anti-ischemic medical therapy: No Therapy  Non-Invasive Test Results: No non-invasive testing performed  Prior CABG: No previous CABG        Burnell Blanks

## 2013-10-28 NOTE — Care Management Note (Signed)
    Page 1 of 1   11/01/2013     10:27:40 AM CARE MANAGEMENT NOTE 11/01/2013  Patient:  [name], [name]   Account Number:  [id number]  Date Initiated:  10/28/2013  Documentation initiated by:  GRAVES-BIGELOW,BRENDA  Subjective/Objective Assessment:   Pt admitted history of cocaine use, HTN, HLD, tobacco abuse admitted with NSTEMI. UDS positive for cocaine. Post cath revealed  Severe triple vessel CAD- per MD notes good candidate for CABG.     Action/Plan:   Referral received for medication assistance. Pt has insurance, therefore CM unable to assist with medications.   Anticipated DC Date:  11/04/2013   Anticipated DC Plan:  [place]  CM consult      Choice offered to / List presented to:             Status of service:  In process, will continue to follow Medicare Important Message given?  NO (If response is "NO", the following Medicare IM given date fields will be blank) Date Medicare IM given:   Date Additional Medicare IM given:    Discharge Disposition:    Per UR Regulation:  Reviewed for med. necessity/level of care/duration of stay  If discussed at [place] of Stay Meetings, dates discussed:   10/31/2013    Comments:  ContactKyndall, [place] Brother [phone]   11-01-13 8:30pm Luz Lex, RNBSN [phone] Post op CABG x4.  Awake and alert post op.  States lives alone, has a brother and sister, neither can assist on discharge.  Interested in a facility [place] post discharge. Will need PT/OT.  Nurse alerted for orders and SW consult placed.  10/30/13- 1430- Marvetta Gibbons RN, BSN [phone] Plan for CABG in AM- remains on IV heparin- NCM to follow post op for d/c needs.

## 2013-10-28 NOTE — Progress Notes (Addendum)
Patient: [name] / Admit Date: 10/26/2013 / Date of Encounter: 10/28/2013, 7:58 AM  Subjective  Denies CP or SOB. Had a lot of good questions about his cath. He does admit to snorting cocaine once in a while but says he can stop - denies SW consult at this time.  Objective   Telemetry: NSR NSR 74bpm LAFB inferior TW flattening, lateral TWI  Physical Exam: Blood pressure 130/85, pulse 78, temperature 97.8 F (36.6 C), temperature source Oral, resp. rate 16, height 6' (1.829 m), weight 210 lb (95.255 kg), SpO2 97.00%. General: Well developed, well nourished AAM, in no acute distress. Head: Normocephalic, atraumatic, sclera non-icteric, no xanthomas, nares are without discharge. Neck: Supple. Lungs:Decreased air movement throughout without wheezes, rales, or rhonchi. Breathing is unlabored. Heart: RRR S1 S2 without murmurs, rubs, or gallops.  Abdomen: Soft, non-tender, non-distended with normoactive bowel sounds. No rebound/guarding. Extremities: No clubbing or cyanosis. No edema. Distal pedal pulses are 2+ and equal bilaterally. Neuro: Alert and oriented X 3. Moves all extremities spontaneously. Psych:  Responds to questions appropriately with a normal affect.   Intake/Output Summary (Last 24 hours) at 10/28/13 0758 Last data filed at 10/28/13 0504  Gross per 24 hour  Intake    760 ml  Output   1300 ml  Net   -540 ml    Inpatient Medications:  . aspirin EC  81 mg Oral Daily  . atorvastatin  40 mg Oral q1800  . mometasone-formoterol  2 puff Inhalation BID  . nicotine  21 mg Transdermal Daily  . sodium chloride  3 mL Intravenous Q12H   Infusions:  . sodium chloride 1 mL/kg/hr (10/27/13 2108)    Labs:  Recent Labs  10/26/13 0502  NA 142  K 4.2  CL 105  CO2 23  GLUCOSE 80  BUN 10  CREATININE 1.05  CALCIUM 9.2     Recent Labs  10/27/13 0517 10/28/13 0450  WBC 5.7 6.5  HGB 13.0 12.5*  HCT 38.5* 37.0*  MCV 86.9 87.3  PLT 291 291    Recent Labs  10/26/13 0502 10/26/13 0920 10/26/13 1545 10/26/13 2020  TROPONINI <0.30 0.37* 0.65* 0.95*     Radiology/Studies:  Dg Chest 2 View  10/26/2013   CLINICAL DATA:  Chest pain and pressure, shortness of breath.  EXAM: CHEST  2 VIEW  COMPARISON:  DG CHEST 2 VIEW dated 05/18/2011  FINDINGS: Cardiomediastinal silhouette is unremarkable. The lungs are clear without pleural effusions or focal consolidations. Right lung base linear densities. Similarly increased lung volumes, with flattening of the hemidiaphragms. Trachea projects midline and there is no pneumothorax. Soft tissue planes and included osseous structures are non-suspicious. Inferred lumbar dextroscoliosis.  IMPRESSION: COPD with right lung base atelectasis versus scarring.   Electronically Signed   By: Elon Alas   On: 10/26/2013 05:26     Assessment and Plan  1. NSTEMI - plan cath today (confirmed OK to proceed with JV-MD given +UDS) - has multiple cardiac risk factors. Will hold this AM's Lovenox dose. If cath is delayed until tomorrow for any reason, this will need to be restarted. Cath lab states case will be done near noon. R/B discussed yesterday; answered pt's questions today. Continue aspirin, statin. 3. Cocaine abuse - + UDS this adm, prior hx noted in 2012. He admits to intermittent use. Stop BB given recent presumed cocaine use. Cessation advised - patient educated on cardiotoxic effects including possibility of death. Can consider addition of Norvasc or Imdur based on cath results.  3. EtOH use - no signs of w/d. Cessation advised.  4. Tobacco abuse with h/o COPD - counseled re: cessation. Nicotine patch in place. 5. HLD - LDL 130 - check baseline LFTs. If + CAD would escalate to atorva 80mg . 6. H/o GIB - perforated ulcer s/p surgery 1970s, no GIB in the last 5 yrs, denies any active bleeding. Add PPI in case PCI is performed.  Signed, Melina Copa PA-C  I have examined the patient and reviewed assessment and plan and  discussed with patient.  Agree with above as stated.  Plan for cath later today. TWI laterally.  Will hold lovenox and not start IV UFH to avoid increased bleeding risk. He feels ok at rest.  Risks and benefits of cath explained.  All questions answered.  WOuld consider bare metal stent due to possible compliance issues.     Jettie Booze

## 2013-10-28 NOTE — Progress Notes (Signed)
ANTICOAGULATION CONSULT NOTE - Initial Consult  Pharmacy Consult for Heparin  Indication: Triple vessel CAD  No Known Allergies  Patient Measurements:  Height: 6' (182.9 cm)  Weight: 210 lb (95.255 kg)  IBW/kg (Calculated) : 77.6  Heparin Dosing Weight: 95 kg  Vital Signs:  Temp: 98 F (36.7 C) (05/18 1408)  Temp src: Oral (05/18 1408)  BP: 140/77 mmHg (05/18 1408)  Pulse Rate: 64 (05/18 1408)  Labs:   Recent Labs   10/26/13 0502  10/26/13 0920  10/26/13 1545  10/26/13 2020  10/27/13 0517  10/28/13 0450   HGB  12.7*  --  --  --  13.0  12.5*   HCT  37.2*  --  --  --  38.5*  37.0*   PLT  286  --  --  --  291  291   LABPROT  --  --  --  --  --  12.0   INR  --  --  --  --  --  0.90   CREATININE  1.05  --  --  --  --  --   TROPONINI  <0.30  0.37*  0.65*  0.95*  --  --    Estimated Creatinine Clearance: 90.8 ml/min (by C-G formula based on Cr of 1.05).  Medical History:  Past Medical History   Diagnosis  Date   .  COPD (chronic obstructive pulmonary disease)    .  Perforation of duodenal ulcer      a. 1970s - s/p surgery.   .  H/O: GI bleed      a. 58 y/o ago, r/t ulcer. Denies recent GIB.   .  H/O: eczema      lower extremities   .  High cholesterol    .  Hypertension    .  Cocaine abuse    .  Tobacco abuse     Assessment:  58 YOM s/p cath found to have triple vessel CAD with plans for CABG to start IV heparin 6 hours post-sheath removal. Prior to cath patient was on full-dose Lovenox. Last dose of Lovenox was on 10/27/13 at 2200PM. CBC wnl. INR baseline. Sheath was removed at 13:49PM with TR band placement. No bleeding or hematoma at site.  Per RN, Erasmo Downer, TR band to be completely off at 1800, so will start heparin at midnight.    Goal of Therapy:  Heparin level 0.3-0.7 units/ml  Monitor platelets by anticoagulation protocol: Yes  Plan:  1. Heparin (NO BOLUS) infusion to start at 950 units/hr at midnight. 0000am.  2. Heparin level 6 hours post-start of infusion.   3. Daily heparin level and CBC while on therapy.   Eudelia Bunch, Pharm.D. [phone] 10/28/2013 6:03 PM

## 2013-10-28 NOTE — Consult Note (Signed)
Reason for Consult:3 vessel CAD, Class IV angina Referring Physician: Dr. McAlhany  [name] is an 58 y.o. male.  HPI: 58 yo with a cc/o chest pain.  [name] is a 58 yo male with a history of hypertension, hyperlipidemia, tobacco abuse and COPD. He also has a history of moderate EtOH consumption and occasional marijuana and cocaine use.   He has been feeling well with no warning signs leading up to this admission. On Friday he says he had a stressful day at work. That evening he had a couple of drinks and smoked cigarettes and marijuana. At 4 AM Saturday AM he was awakened from sleep by chest pressure. The pain radiated to his left arm. He was short of breath, diaphoretic and anxious. He called 911. EMS gave him aspirin and NTG SL and his pain resolved. He was pain free on arrival. His ECG showed some inferior ST depression. His initial troponin was negative but BNP was elevated at 327. He did subsequently r/i for a NSTEMI with a troponin of 0.95.  He has been pain free since admission. Today he underwent cardiac catheterization which revealed severe 3 vessel CAD.   Past Medical History  Diagnosis Date  . COPD (chronic obstructive pulmonary disease)   . Perforation of duodenal ulcer     a. 1970s - s/p surgery.   . H/O: GI bleed     a. 58 y/o ago, r/t ulcer. Denies recent GIB.  . H/O: eczema     lower extremities  . High cholesterol   . Hypertension   . Cocaine abuse   . Tobacco abuse     Past Surgical History  Procedure Laterality Date  . Stomach sx      Family History  Problem Relation Age of Onset  . Lung cancer      Social History:  reports that he has been smoking Cigarettes.  He has a 24 pack-year smoking history. He has never used smokeless tobacco. He reports that he drinks alcohol. He reports that he does not use illicit drugs.  Allergies: No Known Allergies  Medications:  Prior to Admission:  Prescriptions prior to admission  Medication Sig Dispense  Refill  . albuterol (PROAIR HFA) 108 (90 BASE) MCG/ACT inhaler Inhale 1 puff into the lungs every 6 (six) hours as needed for wheezing or shortness of breath.      . Fluticasone-Salmeterol (ADVAIR) 250-50 MCG/DOSE AEPB Inhale 1 puff into the lungs every 12 (twelve) hours.          Results for orders placed during the hospital encounter of 10/26/13 (from the past 48 hour(s))  TROPONIN I     Status: Abnormal   Collection Time    10/26/13  8:20 PM      Result Value Ref Range   Troponin I 0.95 (*) <0.30 ng/mL   Comment:            Due to the release kinetics of cTnI,     a negative result within the first hours     of the onset of symptoms does not rule out     myocardial infarction with certainty.     If myocardial infarction is still suspected,     repeat the test at appropriate intervals.     CRITICAL VALUE NOTED.  VALUE IS CONSISTENT WITH PREVIOUSLY REPORTED AND CALLED VALUE.  CBC     Status: Abnormal   Collection Time    10/27/13  5:17 AM        Result Value Ref Range   WBC 5.7  4.0 - 10.5 K/uL   RBC 4.43  4.22 - 5.81 MIL/uL   Hemoglobin 13.0  13.0 - 17.0 g/dL   HCT 38.5 (*) 39.0 - 52.0 %   MCV 86.9  78.0 - 100.0 fL   MCH 29.3  26.0 - 34.0 pg   MCHC 33.8  30.0 - 36.0 g/dL   RDW 15.1  11.5 - 15.5 %   Platelets 291  150 - 400 K/uL  CBC     Status: Abnormal   Collection Time    10/28/13  4:50 AM      Result Value Ref Range   WBC 6.5  4.0 - 10.5 K/uL   RBC 4.24  4.22 - 5.81 MIL/uL   Hemoglobin 12.5 (*) 13.0 - 17.0 g/dL   HCT 37.0 (*) 39.0 - 52.0 %   MCV 87.3  78.0 - 100.0 fL   MCH 29.5  26.0 - 34.0 pg   MCHC 33.8  30.0 - 36.0 g/dL   RDW 15.3  11.5 - 15.5 %   Platelets 291  150 - 400 K/uL  PROTIME-INR     Status: None   Collection Time    10/28/13  4:50 AM      Result Value Ref Range   Prothrombin Time 12.0  11.6 - 15.2 seconds   INR 0.90  0.00 - 1.49  HEPATIC FUNCTION PANEL     Status: Abnormal   Collection Time    10/28/13  9:30 AM      Result Value Ref Range    Total Protein 6.3  6.0 - 8.3 g/dL   Albumin 3.2 (*) 3.5 - 5.2 g/dL   AST 13  0 - 37 U/L   ALT 12  0 - 53 U/L   Alkaline Phosphatase 105  39 - 117 U/L   Total Bilirubin 0.4  0.3 - 1.2 mg/dL   Bilirubin, Direct <0.2  0.0 - 0.3 mg/dL   Indirect Bilirubin NOT CALCULATED  0.3 - 0.9 mg/dL    No results found.  Review of Systems  Constitutional: Negative for fever, chills and malaise/fatigue.  Respiratory: Negative for cough and shortness of breath.   Cardiovascular: Positive for chest pain. Negative for claudication and leg swelling.  Neurological: Negative for sensory change and focal weakness.  Endo/Heme/Allergies: Does not bruise/bleed easily.  All other systems reviewed and are negative.  Blood pressure 149/79, pulse 62, temperature 98 F (36.7 C), temperature source Oral, resp. rate 18, height 6' (1.829 m), weight 210 lb (95.255 kg), SpO2 97.00%. Physical Exam  Vitals reviewed. Constitutional: He is oriented to person, place, and time. He appears well-developed and well-nourished. No distress.  HENT:  Head: Normocephalic and atraumatic.  Eyes: EOM are normal. Pupils are equal, round, and reactive to light.  Neck: Neck supple. No thyromegaly present.  No carotid bruits  Cardiovascular: Normal rate, regular rhythm, normal heart sounds and intact distal pulses.  Exam reveals no gallop and no friction rub.   No murmur heard. Respiratory: Breath sounds normal. He has no wheezes. He has no rales.  GI: Soft. There is no tenderness.  Musculoskeletal: He exhibits no edema.  Lymphadenopathy:    He has no cervical adenopathy.  Neurological: He is alert and oriented to person, place, and time. No cranial nerve deficit.  No focal motor deficits  Skin: Skin is warm and dry.   CARDIAC CATHETERIZATION Hemodynamic Findings:  Central aortic pressure: 120/69  Left ventricular pressure: 124/12/16  Angiographic  Findings:  Left main: No obstructive disease.  Left Anterior Descending Artery:  Large caliber vessel that courses to the apex. The proximal has long hazy 80% stenosis. There is mild plaque in the mid and distal vessel. Small caliber first diagonal 70% stenosis.  Circumflex Artery: Large caliber vessel with proximal 99% stenosis. The first obtuse marginal branch is moderate in caliber with no obstructive disease. The second obtuse marginal branch is moderate in caliber with proximal 70% stenosis. The distal AV groove Circumflex has 50% stenosis.  Right Coronary Artery: Large dominant vessel with diffuse 40% mid stenosis followed by a focal 70-80% mid stenosis. The distal vessel has mild plaque disease. The PDA is a moderate caliber vessel with serial 40% stenoses.  Left Ventricular Angiogram: LVEF=55-60%  Impression:  1. NSTEMI  2. Severe triple vessel CAD  3. History of questionable medical compliance, substance abuse (cocaine)  4. Normal LV function  Recommendations: Given triple vessel CAD involving the proximal LAD, proximal Circumflex and RCA along with questionable compliance and recent cocaine use, I think CABG is the best revascularization option. All of his lesions could be stented as most severe disease is fairly focal but I do not think multiple drug eluting stents is a safe option in this patient given his compliance and substance abuse issues. Will ask CT surgery to see and review his films. He seems to be a good candidate for CABG. Continue ASA, statin. His beta blocker has been held with UDS positive for cocaine.    Assessment/Plan: [name] with multiple CRF who presented with an unstable coronary syndrome and ruled in for a non-STEMI. At cath he has severe 3 vessel CAD with preserved LV function. CABG is indicated for survival benefit and relief of symptoms.   I discussed with [name] the general nature of the procedure, the need for general anesthesia, the use of cardiopulmonary bypass, and the incisions to be used. We discussed the expected hospital stay,  overall recovery and short and long term outcomes. He understands the risks include, but are not limited to death, stroke, MI, DVT/PE, bleeding, possible need for transfusion, infections, cardiac arrhythmias, pleural effusions, and other organ system dysfunction, including respiratory, renal, or GI complications. He wishes to proceed.  He needs carotid dopplers and PFTs   At present first available OR is Thursday 5/21  Jolleen Seman C Demetres Prochnow 10/28/2013, 5:10 PM      

## 2013-10-28 NOTE — H&P (View-Only) (Signed)
Patient: [name] / Admit Date: 10/26/2013 / Date of Encounter: 10/28/2013, 7:58 AM  Subjective  Denies CP or SOB. Had a lot of good questions about his cath. He does admit to snorting cocaine once in a while but says he can stop - denies SW consult at this time.  Objective   Telemetry: NSR NSR 74bpm LAFB inferior TW flattening, lateral TWI  Physical Exam: Blood pressure 130/85, pulse 78, temperature 97.8 F (36.6 C), temperature source Oral, resp. rate 16, height 6' (1.829 m), weight 210 lb (95.255 kg), SpO2 97.00%. General: Well developed, well nourished AAM, in no acute distress. Head: Normocephalic, atraumatic, sclera non-icteric, no xanthomas, nares are without discharge. Neck: Supple. Lungs:Decreased air movement throughout without wheezes, rales, or rhonchi. Breathing is unlabored. Heart: RRR S1 S2 without murmurs, rubs, or gallops.  Abdomen: Soft, non-tender, non-distended with normoactive bowel sounds. No rebound/guarding. Extremities: No clubbing or cyanosis. No edema. Distal pedal pulses are 2+ and equal bilaterally. Neuro: Alert and oriented X 3. Moves all extremities spontaneously. Psych:  Responds to questions appropriately with a normal affect.   Intake/Output Summary (Last 24 hours) at 10/28/13 0758 Last data filed at 10/28/13 0504  Gross per 24 hour  Intake    760 ml  Output   1300 ml  Net   -540 ml    Inpatient Medications:  . aspirin EC  81 mg Oral Daily  . atorvastatin  40 mg Oral q1800  . mometasone-formoterol  2 puff Inhalation BID  . nicotine  21 mg Transdermal Daily  . sodium chloride  3 mL Intravenous Q12H   Infusions:  . sodium chloride 1 mL/kg/hr (10/27/13 2108)    Labs:  Recent Labs  10/26/13 0502  NA 142  K 4.2  CL 105  CO2 23  GLUCOSE 80  BUN 10  CREATININE 1.05  CALCIUM 9.2     Recent Labs  10/27/13 0517 10/28/13 0450  WBC 5.7 6.5  HGB 13.0 12.5*  HCT 38.5* 37.0*  MCV 86.9 87.3  PLT 291 291    Recent Labs  10/26/13 0502 10/26/13 0920 10/26/13 1545 10/26/13 2020  TROPONINI <0.30 0.37* 0.65* 0.95*     Radiology/Studies:  Dg Chest 2 View  10/26/2013   CLINICAL DATA:  Chest pain and pressure, shortness of breath.  EXAM: CHEST  2 VIEW  COMPARISON:  DG CHEST 2 VIEW dated 05/18/2011  FINDINGS: Cardiomediastinal silhouette is unremarkable. The lungs are clear without pleural effusions or focal consolidations. Right lung base linear densities. Similarly increased lung volumes, with flattening of the hemidiaphragms. Trachea projects midline and there is no pneumothorax. Soft tissue planes and included osseous structures are non-suspicious. Inferred lumbar dextroscoliosis.  IMPRESSION: COPD with right lung base atelectasis versus scarring.   Electronically Signed   By: Elon Alas   On: 10/26/2013 05:26     Assessment and Plan  1. NSTEMI - plan cath today (confirmed OK to proceed with JV-MD given +UDS) - has multiple cardiac risk factors. Will hold this AM's Lovenox dose. If cath is delayed until tomorrow for any reason, this will need to be restarted. Cath lab states case will be done near noon. R/B discussed yesterday; answered pt's questions today. Continue aspirin, statin. 3. Cocaine abuse - + UDS this adm, prior hx noted in 2012. He admits to intermittent use. Stop BB given recent presumed cocaine use. Cessation advised - patient educated on cardiotoxic effects including possibility of death. Can consider addition of Norvasc or Imdur based on cath results.  3. EtOH use - no signs of w/d. Cessation advised.  4. Tobacco abuse with h/o COPD - counseled re: cessation. Nicotine patch in place. 5. HLD - LDL 130 - check baseline LFTs. If + CAD would escalate to atorva 80mg . 6. H/o GIB - perforated ulcer s/p surgery 1970s, no GIB in the last 5 yrs, denies any active bleeding. Add PPI in case PCI is performed.  Signed, Melina Copa PA-C  I have examined the patient and reviewed assessment and plan and  discussed with patient.  Agree with above as stated.  Plan for cath later today. TWI laterally.  Will hold lovenox and not start IV UFH to avoid increased bleeding risk. He feels ok at rest.  Risks and benefits of cath explained.  All questions answered.  WOuld consider bare metal stent due to possible compliance issues.     Jettie Booze

## 2013-10-29 ENCOUNTER — Inpatient Hospital Stay (HOSPITAL_COMMUNITY): Payer: No Typology Code available for payment source

## 2013-10-29 DIAGNOSIS — I2510 Atherosclerotic heart disease of native coronary artery without angina pectoris: Secondary | ICD-10-CM

## 2013-10-29 LAB — CBC
HCT: 40.1 % (ref 39.0–52.0)
Hemoglobin: 13.7 g/dL (ref 13.0–17.0)
MCH: 29.7 pg (ref 26.0–34.0)
MCHC: 34.2 g/dL (ref 30.0–36.0)
MCV: 86.8 fL (ref 78.0–100.0)
PLATELETS: 315 10*3/uL (ref 150–400)
RBC: 4.62 MIL/uL (ref 4.22–5.81)
RDW: 15.2 % (ref 11.5–15.5)
WBC: 5.6 10*3/uL (ref 4.0–10.5)

## 2013-10-29 LAB — BASIC METABOLIC PANEL
BUN: 10 mg/dL (ref 6–23)
CHLORIDE: 104 meq/L (ref 96–112)
CO2: 22 mEq/L (ref 19–32)
CREATININE: 1.03 mg/dL (ref 0.50–1.35)
Calcium: 9.6 mg/dL (ref 8.4–10.5)
GFR, EST AFRICAN AMERICAN: 90 mL/min — AB (ref >90)
GFR, EST NON AFRICAN AMERICAN: 78 mL/min — AB (ref >90)
Glucose, Bld: 149 mg/dL — ABNORMAL HIGH (ref 70–99)
Potassium: 3.9 mEq/L (ref 3.7–5.3)
Sodium: 140 mEq/L (ref 137–147)

## 2013-10-29 LAB — HEPARIN LEVEL (UNFRACTIONATED)
HEPARIN UNFRACTIONATED: 0.21 [IU]/mL — AB (ref 0.30–0.70)
HEPARIN UNFRACTIONATED: 0.37 [IU]/mL (ref 0.30–0.70)

## 2013-10-29 MED ORDER — ALBUTEROL SULFATE (2.5 MG/3ML) 0.083% IN NEBU
2.5000 mg | INHALATION_SOLUTION | Freq: Once | RESPIRATORY_TRACT | Status: AC
Start: 2013-10-29 — End: 2013-10-29
  Administered 2013-10-29: 2.5 mg via RESPIRATORY_TRACT

## 2013-10-29 MED ORDER — ALPRAZOLAM 0.25 MG PO TABS: 0.2500 mg | ORAL_TABLET | Freq: Two times a day (BID) | ORAL | Status: DC | PRN

## 2013-10-29 MED ORDER — HEPARIN BOLUS VIA INFUSION
1300.0000 [IU] | Freq: Once | INTRAVENOUS | Status: AC
  Administered 2013-10-29: 1300 [IU] via INTRAVENOUS
  Filled 2013-10-29: qty 1300

## 2013-10-29 MED ORDER — BISACODYL 5 MG PO TBEC
5.0000 mg | DELAYED_RELEASE_TABLET | Freq: Every day | ORAL | Status: DC | PRN
  Administered 2013-10-29: 5 mg via ORAL
  Filled 2013-10-29 (×2): qty 1

## 2013-10-29 MED ORDER — ATORVASTATIN CALCIUM 80 MG PO TABS
80.0000 mg | ORAL_TABLET | Freq: Every day | ORAL | Status: DC
  Administered 2013-10-29 – 2013-10-30 (×2): 80 mg via ORAL
  Filled 2013-10-29 (×4): qty 1

## 2013-10-29 NOTE — Progress Notes (Signed)
Patient: [name] / Admit Date: 10/26/2013 / Date of Encounter: 10/29/2013, 8:29 AM  Subjective  Denies CP or SOB. He remains an active participant in his care here, asking very good questions. Does report difficulty sleeping at night here and constipation.  Objective   Telemetry: NSR  Physical Exam: Blood pressure 128/82, pulse 71, temperature 98.6 F (37 C), temperature source Oral, resp. rate 18, height 6' (1.829 m), weight 197 lb 11.2 oz (89.676 kg), SpO2 96.00%. General: Well developed, well nourished AAM, in no acute distress.  Head: Normocephalic, atraumatic, sclera non-icteric, no xanthomas, nares are without discharge.  Neck: Supple.  Lungs:Decreased air movement throughout without wheezes, rales, or rhonchi. Breathing is unlabored.  Heart: RRR S1 S2 without murmurs, rubs, or gallops.  Abdomen: Soft, non-tender, non-distended with normoactive bowel sounds. No rebound/guarding.  Extremities: No clubbing or cyanosis. No edema. Distal pedal pulses are 2+ and equal bilaterally. R radial site without complication - no hematoma, ecchymosis or bleeding Neuro: Alert and oriented X 3. Moves all extremities spontaneously.  Psych: Responds to questions appropriately with a normal affect.    Intake/Output Summary (Last 24 hours) at 10/29/13 0829 Last data filed at 10/29/13 0352  Gross per 24 hour  Intake    200 ml  Output   1175 ml  Net   -975 ml    Inpatient Medications:  . aspirin EC  81 mg Oral Daily  . atorvastatin  40 mg Oral q1800  . mometasone-formoterol  2 puff Inhalation BID  . nicotine  21 mg Transdermal Daily  . pantoprazole  40 mg Oral Q0600   Infusions:  . heparin 950 Units/hr (10/29/13 0014)    Labs: No results found for this basename: NA, K, CL, CO2, GLUCOSE, BUN, CREATININE, CALCIUM, MG, PHOS,  in the last 72 hours  Recent Labs  10/28/13 0930  AST 13  ALT 12  ALKPHOS 105  BILITOT 0.4  PROT 6.3  ALBUMIN 3.2*    Recent Labs  10/27/13 0517  10/28/13 0450  WBC 5.7 6.5  HGB 13.0 12.5*  HCT 38.5* 37.0*  MCV 86.9 87.3  PLT 291 291    Recent Labs  10/26/13 0920 10/26/13 1545 10/26/13 2020  TROPONINI 0.37* 0.65* 0.95*   No components found with this basename: POCBNP,  No results found for this basename: HGBA1C,  in the last 72 hours   Radiology/Studies:  Dg Chest 2 View  10/26/2013   CLINICAL DATA:  Chest pain and pressure, shortness of breath.  EXAM: CHEST  2 VIEW  COMPARISON:  DG CHEST 2 VIEW dated 05/18/2011  FINDINGS: Cardiomediastinal silhouette is unremarkable. The lungs are clear without pleural effusions or focal consolidations. Right lung base linear densities. Similarly increased lung volumes, with flattening of the hemidiaphragms. Trachea projects midline and there is no pneumothorax. Soft tissue planes and included osseous structures are non-suspicious. Inferred lumbar dextroscoliosis.  IMPRESSION: COPD with right lung base atelectasis versus scarring.   Electronically Signed   By: Elon Alas   On: 10/26/2013 05:26     Assessment and Plan  1. NSTEMI/3V CAD with normal LV function - not on BB due to cocaine abuse. Given concern for compliance and substance abuse issues, CABG favored over multivessel PCI for survival benefit and relief of symptoms. Remains on heparin. Appears from notes [facility] tentatively plans for OR 5/21. Consider addition of Norvasc for modest elevation in BPs.  3. Cocaine abuse - not on BB due to this. Long discussion about dangers of this 5/18.  3. EtOH use - no signs of w/d. Cessation advised.  4. Tobacco abuse with h/o COPD - counseled re: cessation. Nicotine patch in place.  5. HLD - LDL 130 - escalate atorva to 80mg  given 3v dz. 6. H/o GIB - perforated ulcer s/p surgery 1970s, no GIB in the last 5 yrs, denies any active bleeding. Continue PPI. 7. Constipation - rx PRN laxative. 8. Sleeplessness - low dose Xanax prn.  Signed, Melina Copa PA-C  I have examined the patient and reviewed  assessment and plan and discussed with patient.  Agree with above as stated.  CABG Thursday.  Questions answered.   Jettie Booze

## 2013-10-29 NOTE — Progress Notes (Signed)
ANTICOAGULATION CONSULT NOTE - Follow Up Consult  Pharmacy Consult for Heparin Indication: Triple vessel CAD awaiting CABG  No Known Allergies  Patient Measurements: Height: 6' (182.9 cm) Weight: 197 lb 11.2 oz (89.676 kg) IBW/kg (Calculated) : 77.6 Heparin Dosing Weight: 89 kg  Vital Signs: Temp: 98.6 F (37 C) (05/19 0352) Temp src: Oral (05/19 0352) BP: 128/82 mmHg (05/19 0352) Pulse Rate: 71 (05/19 0352)  Labs:  Recent Labs  10/26/13 1545 10/26/13 2020  10/27/13 0517 10/28/13 0450 10/29/13 0915  HGB  --   --   < > 13.0 12.5* 13.7  HCT  --   --   --  38.5* 37.0* 40.1  PLT  --   --   --  291 291 315  LABPROT  --   --   --   --  12.0  --   INR  --   --   --   --  0.90  --   HEPARINUNFRC  --   --   --   --   --  0.21*  CREATININE  --   --   --   --   --  1.03  TROPONINI 0.65* 0.95*  --   --   --   --   < > = values in this interval not displayed.  Estimated Creatinine Clearance: 84.8 ml/min (by C-G formula based on Cr of 1.03).   Medications:  Infusions:  . heparin 950 Units/hr (10/29/13 0014)    Assessment: 64 YOM on IV heparin for triple vessel CAD with plan for CABG 10/31/13.   Initial heparin level is sub-therapeutic at 0.21. CBC is stable. No bleeding reported.   Goal of Therapy:  Heparin level 0.3-0.7 units/ml Monitor platelets by anticoagulation protocol: Yes   Plan:  1. Heparin bolus of 1300 units, then increase heparin drip to 1150 units/hr.  2. Heparin level in 6 hours. 3. Continue daily heparin level and CBC while on therapy.   Sloan Leiter, PharmD, BCPS Clinical Pharmacist [phone] 10/29/2013,10:38 AM

## 2013-10-29 NOTE — Progress Notes (Signed)
ANTICOAGULATION CONSULT NOTE - Follow Up Consult  Pharmacy Consult for Heparin Indication: Triple vessel CAD awaiting CABG  No Known Allergies  Patient Measurements: Height: 6' (182.9 cm) Weight: 197 lb 11.2 oz (89.676 kg) IBW/kg (Calculated) : 77.6 Heparin Dosing Weight: 89 kg  Vital Signs: Temp: 97.3 F (36.3 C) (05/19 1400) Temp src: Oral (05/19 1400) BP: 107/89 mmHg (05/19 1400) Pulse Rate: 80 (05/19 1400)  Labs:  Recent Labs  10/26/13 2020  10/27/13 0517 10/28/13 0450 10/29/13 0915 10/29/13 1648  HGB  --   < > 13.0 12.5* 13.7  --   HCT  --   --  38.5* 37.0* 40.1  --   PLT  --   --  291 291 315  --   LABPROT  --   --   --  12.0  --   --   INR  --   --   --  0.90  --   --   HEPARINUNFRC  --   --   --   --  0.21* 0.37  CREATININE  --   --   --   --  1.03  --   TROPONINI 0.95*  --   --   --   --   --   < > = values in this interval not displayed.  Estimated Creatinine Clearance: 84.8 ml/min (by C-G formula based on Cr of 1.03).   Medications:  Infusions:  . heparin 1,150 Units/hr (10/29/13 1139)    Assessment: 68 YOM on IV heparin for triple vessel CAD with plan for CABG 10/31/13.   Heparin level is therapeutic at 0.37.   Goal of Therapy:  Heparin level 0.3-0.7 units/ml Monitor platelets by anticoagulation protocol: Yes   Plan:  1. Continue heparin at the same rate 2. Continue daily heparin level and CBC while on therapy.   Heide Guile, PharmD, BCPS Clinical Pharmacist Pager [phone]   10/29/2013,5:43 PM

## 2013-10-29 NOTE — Progress Notes (Deleted)
CARDIAC REHAB PHASE I   Pt walking independently. Discussed sternal precautions, mobility post op, IS with pt. Good understanding. Gave OHS and guideline.  [place], ACSM 10/29/2013 3:32 PM

## 2013-10-30 ENCOUNTER — Encounter (HOSPITAL_COMMUNITY): Payer: No Typology Code available for payment source

## 2013-10-30 DIAGNOSIS — Z01810 Encounter for preprocedural cardiovascular examination: Secondary | ICD-10-CM

## 2013-10-30 LAB — URINALYSIS, ROUTINE W REFLEX MICROSCOPIC
Bilirubin Urine: NEGATIVE
Glucose, UA: NEGATIVE mg/dL
HGB URINE DIPSTICK: NEGATIVE
Ketones, ur: NEGATIVE mg/dL
Leukocytes, UA: NEGATIVE
NITRITE: NEGATIVE
PROTEIN: NEGATIVE mg/dL
SPECIFIC GRAVITY, URINE: 1.024 (ref 1.005–1.030)
UROBILINOGEN UA: 1 mg/dL (ref 0.0–1.0)
pH: 5.5 (ref 5.0–8.0)

## 2013-10-30 LAB — BLOOD GAS, ARTERIAL
Acid-base deficit: 2 mmol/L (ref 0.0–2.0)
BICARBONATE: 22.1 meq/L (ref 20.0–24.0)
Drawn by: 32470
FIO2: 0.21 %
O2 SAT: 97.6 %
PATIENT TEMPERATURE: 98.6
TCO2: 23.3 mmol/L (ref 0–100)
pCO2 arterial: 37 mmHg (ref 35.0–45.0)
pH, Arterial: 7.394 (ref 7.350–7.450)
pO2, Arterial: 96.8 mmHg (ref 80.0–100.0)

## 2013-10-30 LAB — TYPE AND SCREEN
ABO/RH(D): O POS
ANTIBODY SCREEN: NEGATIVE

## 2013-10-30 LAB — APTT: aPTT: 103 seconds — ABNORMAL HIGH (ref 24–37)

## 2013-10-30 LAB — CBC
HCT: 40.1 % (ref 39.0–52.0)
Hemoglobin: 13.4 g/dL (ref 13.0–17.0)
MCH: 29.3 pg (ref 26.0–34.0)
MCHC: 33.4 g/dL (ref 30.0–36.0)
MCV: 87.6 fL (ref 78.0–100.0)
PLATELETS: 284 10*3/uL (ref 150–400)
RBC: 4.58 MIL/uL (ref 4.22–5.81)
RDW: 15.4 % (ref 11.5–15.5)
WBC: 6.3 10*3/uL (ref 4.0–10.5)

## 2013-10-30 LAB — SURGICAL PCR SCREEN
MRSA, PCR: NEGATIVE
STAPHYLOCOCCUS AUREUS: NEGATIVE

## 2013-10-30 LAB — HEPARIN LEVEL (UNFRACTIONATED): HEPARIN UNFRACTIONATED: 0.42 [IU]/mL (ref 0.30–0.70)

## 2013-10-30 LAB — ABO/RH: ABO/RH(D): O POS

## 2013-10-30 MED ORDER — CHLORHEXIDINE GLUCONATE CLOTH 2 % EX PADS
6.0000 | MEDICATED_PAD | Freq: Once | CUTANEOUS | Status: AC
  Administered 2013-10-31: 6 via TOPICAL

## 2013-10-30 MED ORDER — DIAZEPAM 5 MG PO TABS
5.0000 mg | ORAL_TABLET | Freq: Once | ORAL | Status: AC
  Administered 2013-10-31: 5 mg via ORAL
  Filled 2013-10-30: qty 1

## 2013-10-30 MED ORDER — PHENYLEPHRINE HCL 10 MG/ML IJ SOLN
30.0000 ug/min | INTRAMUSCULAR | Status: AC
  Administered 2013-10-31: 40 ug/min via INTRAVENOUS
  Filled 2013-10-30: qty 2

## 2013-10-30 MED ORDER — VANCOMYCIN HCL 10 G IV SOLR
1250.0000 mg | INTRAVENOUS | Status: AC
  Administered 2013-10-31: 1250 mg via INTRAVENOUS
  Filled 2013-10-30: qty 1250

## 2013-10-30 MED ORDER — DEXMEDETOMIDINE HCL IN NACL 400 MCG/100ML IV SOLN
0.1000 ug/kg/h | INTRAVENOUS | Status: AC
  Administered 2013-10-31: 0.3 ug/kg/h via INTRAVENOUS
  Filled 2013-10-30: qty 100

## 2013-10-30 MED ORDER — NITROGLYCERIN IN D5W 200-5 MCG/ML-% IV SOLN
2.0000 ug/min | INTRAVENOUS | Status: DC
  Filled 2013-10-30: qty 250

## 2013-10-30 MED ORDER — DEXTROSE 5 % IV SOLN
0.5000 ug/min | INTRAVENOUS | Status: DC
  Filled 2013-10-30: qty 4

## 2013-10-30 MED ORDER — DOPAMINE-DEXTROSE 3.2-5 MG/ML-% IV SOLN
2.0000 ug/kg/min | INTRAVENOUS | Status: DC
  Filled 2013-10-30: qty 250

## 2013-10-30 MED ORDER — SODIUM CHLORIDE 0.9 % IV SOLN
INTRAVENOUS | Status: AC
  Administered 2013-10-31: 69.8 mL/h via INTRAVENOUS
  Filled 2013-10-30: qty 40

## 2013-10-30 MED ORDER — PLASMA-LYTE 148 IV SOLN
INTRAVENOUS | Status: AC
  Administered 2013-10-31: 08:00:00
  Filled 2013-10-30: qty 2.5

## 2013-10-30 MED ORDER — SODIUM CHLORIDE 0.9 % IV SOLN
INTRAVENOUS | Status: DC
  Filled 2013-10-30: qty 30

## 2013-10-30 MED ORDER — DEXTROSE 5 % IV SOLN
750.0000 mg | INTRAVENOUS | Status: DC
  Filled 2013-10-30: qty 750

## 2013-10-30 MED ORDER — DEXTROSE 5 % IV SOLN
1.5000 g | INTRAVENOUS | Status: AC
  Administered 2013-10-31: .75 g via INTRAVENOUS
  Administered 2013-10-31: 1.5 g via INTRAVENOUS
  Filled 2013-10-30: qty 1.5

## 2013-10-30 MED ORDER — SODIUM CHLORIDE 0.9 % IV SOLN
INTRAVENOUS | Status: AC
  Administered 2013-10-31: 1.2 [IU]/h via INTRAVENOUS
  Filled 2013-10-30: qty 1

## 2013-10-30 MED ORDER — CHLORHEXIDINE GLUCONATE CLOTH 2 % EX PADS
6.0000 | MEDICATED_PAD | Freq: Once | CUTANEOUS | Status: AC
  Administered 2013-10-30: 6 via TOPICAL

## 2013-10-30 MED ORDER — CARVEDILOL 3.125 MG PO TABS
3.1250 mg | ORAL_TABLET | Freq: Once | ORAL | Status: AC
  Administered 2013-10-31: 3.125 mg via ORAL
  Filled 2013-10-30: qty 1

## 2013-10-30 MED ORDER — BISACODYL 5 MG PO TBEC
5.0000 mg | DELAYED_RELEASE_TABLET | Freq: Once | ORAL | Status: AC
  Administered 2013-10-30: 5 mg via ORAL
  Filled 2013-10-30: qty 1

## 2013-10-30 MED ORDER — POTASSIUM CHLORIDE 2 MEQ/ML IV SOLN
80.0000 meq | INTRAVENOUS | Status: DC
  Filled 2013-10-30: qty 40

## 2013-10-30 MED ORDER — MAGNESIUM SULFATE 50 % IJ SOLN
40.0000 meq | INTRAMUSCULAR | Status: DC
  Filled 2013-10-30: qty 10

## 2013-10-30 NOTE — Progress Notes (Signed)
CARDIAC REHAB PHASE I   PRE:  Rate/Rhythm: 89 SR    BP: sitting 131/76    SaO2:   MODE:  Ambulation: 460 ft   POST:  Rate/Rhythm: 97 SR    BP: sitting 139/71     SaO2:   Tolerated well, no c/o. Denies CP. Pre-op ed completed, pt with many appropriate questions. Has read book.  [phone]  [place], ACSM 10/30/2013 12:11 PM

## 2013-10-30 NOTE — Progress Notes (Signed)
ANTICOAGULATION CONSULT NOTE - Follow Up Consult  Pharmacy Consult for Heparin Indication: Triple vessel CAD awaiting CABG  No Known Allergies  Patient Measurements: Height: 6' (182.9 cm) Weight: 197 lb 4.8 oz (89.495 kg) IBW/kg (Calculated) : 77.6 Heparin Dosing Weight: 89 kg  Vital Signs: Temp: 98 F (36.7 C) (05/20 0600) Temp src: Oral (05/20 0600) BP: 134/70 mmHg (05/20 0600) Pulse Rate: 78 (05/20 0600)  Labs:  Recent Labs  10/28/13 0450 10/29/13 0915 10/29/13 1648 10/30/13 0610  HGB 12.5* 13.7  --  13.4  HCT 37.0* 40.1  --  40.1  PLT 291 315  --  284  LABPROT 12.0  --   --   --   INR 0.90  --   --   --   HEPARINUNFRC  --  0.21* 0.37 0.42  CREATININE  --  1.03  --   --     Estimated Creatinine Clearance: 84.8 ml/min (by C-G formula based on Cr of 1.03).   Medications:  Infusions:  . heparin 1,150 Units/hr (10/29/13 2159)    Assessment: 21 YOM on IV heparin for triple vessel CAD with plan for CABG 10/31/13.   Heparin level is therapeutic.  CBC wnl.   Goal of Therapy:  Heparin level 0.3-0.7 units/ml Monitor platelets by anticoagulation protocol: Yes   Plan:  1. Continue heparin at1150 units/hr. 2. Daily heparin level and CBC.   Sloan Leiter, PharmD, BCPS Clinical Pharmacist [phone]  10/30/2013,10:27 AM

## 2013-10-30 NOTE — Progress Notes (Signed)
VASCULAR LAB PRELIMINARY  PRELIMINARY  PRELIMINARY  PRELIMINARY  Pre-op Cardiac Surgery  Carotid Findings:  Bilateral:  1-39% ICA stenosis.  Vertebral artery flow is antegrade.     Upper Extremity Right Left  Brachial Pressures 127 Triphasic 131 Triphasic  Radial Waveforms Triphasic Triphsic  Ulnar Waveforms Triphasic Triphasic  Palmar Arch (Allen's Test) Normal Abnormal   Findings:  Right - Doppler waveforms remained normal with both radial and ulnar compressions. Left - Doppler waveforms obliterated with radial compression and remained normal with ulnar compression    Lower  Extremity Right Left  Dorsalis Pedis 101 Triphasic 136 Triphasic  Posterior Tibial 142 Dampened Monophasic 129 Triphasic  Ankle/Brachial Indices 1.08 1.04    Findings:  ABIs indicate normal arterial flow bilaterally at rest.   Dareen Piano, RVS 10/30/2013, 4:42 PM

## 2013-10-30 NOTE — Progress Notes (Signed)
Patient: [name] / Admit Date: 10/26/2013 / Date of Encounter: 10/30/2013, 9:49 AM  Subjective  Mild chest pressure lasting <2 min yesterday ("0.3/10"). Had PFTs yesterday. Awaiting pre CABG dopplers. Awaiting final plan for OR from [facility] - scheduled in Epic for tomorrow.  Objective   Telemetry: NSR  Physical Exam: Blood pressure 134/70, pulse 78, temperature 98 F (36.7 C), temperature source Oral, resp. rate 20, height 6' (1.829 m), weight 197 lb 4.8 oz (89.495 kg), SpO2 97.00%. General: Well developed, well nourished AAM, in no acute distress.  Head: Normocephalic, atraumatic, sclera non-icteric, no xanthomas, nares are without discharge.  Neck: Supple.  Lungs:Decreased air movement throughout without wheezes, rales, or rhonchi. Breathing is unlabored.  Heart: RRR S1 S2 without murmurs, rubs, or gallops.  Abdomen: Soft, non-tender, non-distended with normoactive bowel sounds. No rebound/guarding.  Extremities: No clubbing or cyanosis. No edema. Distal pedal pulses are 2+ and equal bilaterally. R radial site without complication - no hematoma, ecchymosis or bleeding  Neuro: Alert and oriented X 3. Moves all extremities spontaneously.  Psych: Responds to questions appropriately with a normal affect.   Intake/Output Summary (Last 24 hours) at 10/30/13 0949 Last data filed at 10/30/13 0601  Gross per 24 hour  Intake    220 ml  Output   2400 ml  Net  -2180 ml    Inpatient Medications:  . aspirin EC  81 mg Oral Daily  . atorvastatin  80 mg Oral q1800  . mometasone-formoterol  2 puff Inhalation BID  . nicotine  21 mg Transdermal Daily  . pantoprazole  40 mg Oral Q0600   Infusions:  . heparin 1,150 Units/hr (10/29/13 2159)    Labs:  Recent Labs  10/29/13 0915  NA 140  K 3.9  CL 104  CO2 22  GLUCOSE 149*  BUN 10  CREATININE 1.03  CALCIUM 9.6    Recent Labs  10/28/13 0930  AST 13  ALT 12  ALKPHOS 105  BILITOT 0.4  PROT 6.3  ALBUMIN 3.2*    Recent  Labs  10/29/13 0915 10/30/13 0610  WBC 5.6 6.3  HGB 13.7 13.4  HCT 40.1 40.1  MCV 86.8 87.6  PLT 315 284   No results found for this basename: CKTOTAL, CKMB, TROPONINI,  in the last 72 hours No components found with this basename: POCBNP,  No results found for this basename: HGBA1C,  in the last 72 hours   Radiology/Studies:  Dg Chest 2 View  10/26/2013   CLINICAL DATA:  Chest pain and pressure, shortness of breath.  EXAM: CHEST  2 VIEW  COMPARISON:  DG CHEST 2 VIEW dated 05/18/2011  FINDINGS: Cardiomediastinal silhouette is unremarkable. The lungs are clear without pleural effusions or focal consolidations. Right lung base linear densities. Similarly increased lung volumes, with flattening of the hemidiaphragms. Trachea projects midline and there is no pneumothorax. Soft tissue planes and included osseous structures are non-suspicious. Inferred lumbar dextroscoliosis.  IMPRESSION: COPD with right lung base atelectasis versus scarring.   Electronically Signed   By: Elon Alas   On: 10/26/2013 05:26     Assessment and Plan  1. NSTEMI/3V CAD with normal LV function - Given concern for compliance and substance abuse issues, CABG favored over multivessel PCI for survival benefit and relief of symptoms. Remains on heparin. Appears from notes [facility] tentatively plans for OR 5/21. Pre-CABG dopplers pending. Has not been on BB due to cocaine abuse. ? Role of perioperative BB since he will be inpatient and abstaining from  cocaine - have spoken with pharmacy and they report that combo alpha/BB (ie Coreg) are generally safe to use 48 hours out from cocaine intoxication. He has been inpatient since 5/16. 3. Cocaine abuse - Long discussion about dangers of this 5/18. He seems to understand need to quit and denies need for SW consult. 3. EtOH use - no signs of w/d. Cessation advised.  4. Tobacco abuse with h/o COPD - counseled re: cessation and he seems motivated. Nicotine patch in place. PFTs:  "Moderately severe Obstructive Airways Disease."  5. HLD - LDL 130 - continue atorva 80mg  given 3v dz.  6. H/o GIB - perforated ulcer s/p surgery 1970s, no GIB in the last 5 yrs, denies any active bleeding. Continue PPI.  7. Constipation - rx PRN laxative.  8. Sleeplessness - low dose Xanax prn.   He remains an active participant in his care, asking excellent questions and displaying motivation to cease his bad habits.  Signed,  Melina Copa PA-C  I have examined the patient and reviewed assessment and plan and discussed with patient.  Agree with above as stated.  Plan for CABG in AM.  Jettie Booze

## 2013-10-30 NOTE — Progress Notes (Signed)
Utilization review completed.  

## 2013-10-31 ENCOUNTER — Inpatient Hospital Stay (HOSPITAL_COMMUNITY): Payer: No Typology Code available for payment source

## 2013-10-31 ENCOUNTER — Inpatient Hospital Stay (HOSPITAL_COMMUNITY): Payer: No Typology Code available for payment source | Admitting: Anesthesiology

## 2013-10-31 ENCOUNTER — Encounter (HOSPITAL_COMMUNITY): Payer: No Typology Code available for payment source | Admitting: Anesthesiology

## 2013-10-31 ENCOUNTER — Encounter (HOSPITAL_COMMUNITY): Payer: Self-pay | Admitting: Certified Registered Nurse Anesthetist

## 2013-10-31 ENCOUNTER — Encounter (HOSPITAL_COMMUNITY)
Admission: EM | Disposition: A | Discharge status: Skilled Nursing Facility | Payer: No Typology Code available for payment source | Source: Home / Self Care | Attending: Thoracic Surgery (Cardiothoracic Vascular Surgery)

## 2013-10-31 DIAGNOSIS — Z951 Presence of aortocoronary bypass graft: Secondary | ICD-10-CM

## 2013-10-31 HISTORY — PX: CORONARY ARTERY BYPASS GRAFT: SHX141

## 2013-10-31 HISTORY — PX: INTRAOPERATIVE TRANSESOPHAGEAL ECHOCARDIOGRAM: SHX5062

## 2013-10-31 LAB — CBC
HCT: 40.3 % (ref 39.0–52.0)
HCT: 32 % — ABNORMAL LOW (ref 39.0–52.0)
HEMATOCRIT: 32.2 % — AB (ref 39.0–52.0)
HEMOGLOBIN: 13.3 g/dL (ref 13.0–17.0)
HEMOGLOBIN: 10.6 g/dL — AB (ref 13.0–17.0)
HEMOGLOBIN: 11 g/dL — AB (ref 13.0–17.0)
MCH: 29.4 pg (ref 26.0–34.0)
MCH: 29.1 pg (ref 26.0–34.0)
MCH: 30.1 pg (ref 26.0–34.0)
MCHC: 33 g/dL (ref 30.0–36.0)
MCHC: 32.9 g/dL (ref 30.0–36.0)
MCHC: 34.4 g/dL (ref 30.0–36.0)
MCV: 89 fL (ref 78.0–100.0)
MCV: 88.5 fL (ref 78.0–100.0)
MCV: 87.4 fL (ref 78.0–100.0)
PLATELETS: 190 10*3/uL (ref 150–400)
Platelets: 313 10*3/uL (ref 150–400)
Platelets: 154 10*3/uL (ref 150–400)
RBC: 4.53 MIL/uL (ref 4.22–5.81)
RBC: 3.64 MIL/uL — ABNORMAL LOW (ref 4.22–5.81)
RBC: 3.66 MIL/uL — AB (ref 4.22–5.81)
RDW: 15.6 % — ABNORMAL HIGH (ref 11.5–15.5)
RDW: 15.4 % (ref 11.5–15.5)
RDW: 15.3 % (ref 11.5–15.5)
WBC: 5.5 10*3/uL (ref 4.0–10.5)
WBC: 11.1 10*3/uL — ABNORMAL HIGH (ref 4.0–10.5)
WBC: 10.8 10*3/uL — AB (ref 4.0–10.5)

## 2013-10-31 LAB — POCT I-STAT 3, ART BLOOD GAS (G3+)
ACID-BASE DEFICIT: 2 mmol/L (ref 0.0–2.0)
ACID-BASE DEFICIT: 2 mmol/L (ref 0.0–2.0)
Acid-base deficit: 3 mmol/L — ABNORMAL HIGH (ref 0.0–2.0)
Acid-base deficit: 2 mmol/L (ref 0.0–2.0)
Acid-base deficit: 3 mmol/L — ABNORMAL HIGH (ref 0.0–2.0)
BICARBONATE: 24.4 meq/L — AB (ref 20.0–24.0)
BICARBONATE: 23.7 meq/L (ref 20.0–24.0)
BICARBONATE: 22.5 meq/L (ref 20.0–24.0)
Bicarbonate: 23.5 mEq/L (ref 20.0–24.0)
Bicarbonate: 23.7 mEq/L (ref 20.0–24.0)
O2 SAT: 100 %
O2 SAT: 99 %
O2 SAT: 99 %
O2 SAT: 97 %
O2 Saturation: 100 %
PCO2 ART: 48.6 mmHg — AB (ref 35.0–45.0)
PCO2 ART: 44.1 mmHg (ref 35.0–45.0)
PO2 ART: 354 mmHg — AB (ref 80.0–100.0)
PO2 ART: 142 mmHg — AB (ref 80.0–100.0)
PO2 ART: 103 mmHg — AB (ref 80.0–100.0)
Patient temperature: 35.5
Patient temperature: 36.9
Patient temperature: 37.2
TCO2: 25 mmol/L (ref 0–100)
TCO2: 25 mmol/L (ref 0–100)
TCO2: 26 mmol/L (ref 0–100)
TCO2: 25 mmol/L (ref 0–100)
TCO2: 24 mmol/L (ref 0–100)
pCO2 arterial: 42.2 mmHg (ref 35.0–45.0)
pCO2 arterial: 46.8 mmHg — ABNORMAL HIGH (ref 35.0–45.0)
pCO2 arterial: 43.4 mmHg (ref 35.0–45.0)
pH, Arterial: 7.293 — ABNORMAL LOW (ref 7.350–7.450)
pH, Arterial: 7.356 (ref 7.350–7.450)
pH, Arterial: 7.318 — ABNORMAL LOW (ref 7.350–7.450)
pH, Arterial: 7.337 — ABNORMAL LOW (ref 7.350–7.450)
pH, Arterial: 7.324 — ABNORMAL LOW (ref 7.350–7.450)
pO2, Arterial: 457 mmHg — ABNORMAL HIGH (ref 80.0–100.0)
pO2, Arterial: 158 mmHg — ABNORMAL HIGH (ref 80.0–100.0)

## 2013-10-31 LAB — PROTIME-INR
INR: 0.89 (ref 0.00–1.49)
INR: 1.25 (ref 0.00–1.49)
PROTHROMBIN TIME: 11.9 s (ref 11.6–15.2)
Prothrombin Time: 15.4 seconds — ABNORMAL HIGH (ref 11.6–15.2)

## 2013-10-31 LAB — POCT I-STAT, CHEM 8
BUN: 9 mg/dL (ref 6–23)
CHLORIDE: 107 meq/L (ref 96–112)
Calcium, Ion: 1.28 mmol/L — ABNORMAL HIGH (ref 1.12–1.23)
Creatinine, Ser: 1.1 mg/dL (ref 0.50–1.35)
Glucose, Bld: 121 mg/dL — ABNORMAL HIGH (ref 70–99)
HEMATOCRIT: 34 % — AB (ref 39.0–52.0)
Hemoglobin: 11.6 g/dL — ABNORMAL LOW (ref 13.0–17.0)
Potassium: 4.5 mEq/L (ref 3.7–5.3)
Sodium: 139 mEq/L (ref 137–147)
TCO2: 23 mmol/L (ref 0–100)

## 2013-10-31 LAB — COMPREHENSIVE METABOLIC PANEL
ALK PHOS: 111 U/L (ref 39–117)
ALT: 45 U/L (ref 0–53)
AST: 33 U/L (ref 0–37)
Albumin: 3.3 g/dL — ABNORMAL LOW (ref 3.5–5.2)
BUN: 12 mg/dL (ref 6–23)
CHLORIDE: 106 meq/L (ref 96–112)
CO2: 23 mEq/L (ref 19–32)
CREATININE: 1.1 mg/dL (ref 0.50–1.35)
Calcium: 8.8 mg/dL (ref 8.4–10.5)
GFR calc non Af Amer: 72 mL/min — ABNORMAL LOW (ref >90)
GFR, EST AFRICAN AMERICAN: 83 mL/min — AB (ref >90)
GLUCOSE: 101 mg/dL — AB (ref 70–99)
POTASSIUM: 4.3 meq/L (ref 3.7–5.3)
Sodium: 141 mEq/L (ref 137–147)
Total Bilirubin: 0.3 mg/dL (ref 0.3–1.2)
Total Protein: 6.3 g/dL (ref 6.0–8.3)

## 2013-10-31 LAB — PULMONARY FUNCTION TEST
DL/VA % pred: 93 %
DL/VA: 4.42 ml/min/mmHg/L
DLCO unc % pred: 81 %
DLCO unc: 28.71 ml/min/mmHg
FEF 25-75 POST: 1.09 L/s
FEF 25-75 Pre: 0.9 L/sec
FEF2575-%Change-Post: 21 %
FEF2575-%Pred-Post: 35 %
FEF2575-%Pred-Pre: 28 %
FEV1-%Change-Post: 8 %
FEV1-%PRED-POST: 64 %
FEV1-%Pred-Pre: 59 %
FEV1-POST: 2.18 L
FEV1-Pre: 2.01 L
FEV1FVC-%CHANGE-POST: 9 %
FEV1FVC-%Pred-Pre: 69 %
FEV6-%Change-Post: 0 %
FEV6-%Pred-Post: 81 %
FEV6-%Pred-Pre: 82 %
FEV6-POST: 3.42 L
FEV6-PRE: 3.43 L
FEV6FVC-%Change-Post: 6 %
FEV6FVC-%Pred-Post: 101 %
FEV6FVC-%Pred-Pre: 95 %
FVC-%CHANGE-POST: -1 %
FVC-%PRED-PRE: 85 %
FVC-%Pred-Post: 84 %
FVC-PRE: 3.7 L
FVC-Post: 3.64 L
PRE FEV1/FVC RATIO: 54 %
Post FEV1/FVC ratio: 60 %
Post FEV6/FVC ratio: 98 %
Pre FEV6/FVC Ratio: 93 %
RV % pred: 170 %
RV: 4 L
TLC % pred: 113 %
TLC: 8.4 L

## 2013-10-31 LAB — POCT I-STAT 4, (NA,K, GLUC, HGB,HCT)
GLUCOSE: 101 mg/dL — AB (ref 70–99)
GLUCOSE: 117 mg/dL — AB (ref 70–99)
Glucose, Bld: 88 mg/dL (ref 70–99)
Glucose, Bld: 99 mg/dL (ref 70–99)
Glucose, Bld: 102 mg/dL — ABNORMAL HIGH (ref 70–99)
Glucose, Bld: 112 mg/dL — ABNORMAL HIGH (ref 70–99)
HCT: 29 % — ABNORMAL LOW (ref 39.0–52.0)
HCT: 38 % — ABNORMAL LOW (ref 39.0–52.0)
HCT: 27 % — ABNORMAL LOW (ref 39.0–52.0)
HCT: 29 % — ABNORMAL LOW (ref 39.0–52.0)
HCT: 35 % — ABNORMAL LOW (ref 39.0–52.0)
HEMATOCRIT: 38 % — AB (ref 39.0–52.0)
HEMOGLOBIN: 12.9 g/dL — AB (ref 13.0–17.0)
HEMOGLOBIN: 9.9 g/dL — AB (ref 13.0–17.0)
HEMOGLOBIN: 9.9 g/dL — AB (ref 13.0–17.0)
HEMOGLOBIN: 11.9 g/dL — AB (ref 13.0–17.0)
Hemoglobin: 12.9 g/dL — ABNORMAL LOW (ref 13.0–17.0)
Hemoglobin: 9.2 g/dL — ABNORMAL LOW (ref 13.0–17.0)
POTASSIUM: 4.9 meq/L (ref 3.7–5.3)
POTASSIUM: 6.4 meq/L — AB (ref 3.7–5.3)
Potassium: 4.2 mEq/L (ref 3.7–5.3)
Potassium: 5.1 mEq/L (ref 3.7–5.3)
Potassium: 5.5 mEq/L — ABNORMAL HIGH (ref 3.7–5.3)
Potassium: 4.7 mEq/L (ref 3.7–5.3)
SODIUM: 139 meq/L (ref 137–147)
Sodium: 140 mEq/L (ref 137–147)
Sodium: 139 mEq/L (ref 137–147)
Sodium: 134 mEq/L — ABNORMAL LOW (ref 137–147)
Sodium: 136 mEq/L — ABNORMAL LOW (ref 137–147)
Sodium: 139 mEq/L (ref 137–147)

## 2013-10-31 LAB — GLUCOSE, CAPILLARY
GLUCOSE-CAPILLARY: 107 mg/dL — AB (ref 70–99)
GLUCOSE-CAPILLARY: 113 mg/dL — AB (ref 70–99)
GLUCOSE-CAPILLARY: 98 mg/dL (ref 70–99)
Glucose-Capillary: 107 mg/dL — ABNORMAL HIGH (ref 70–99)
Glucose-Capillary: 108 mg/dL — ABNORMAL HIGH (ref 70–99)
Glucose-Capillary: 114 mg/dL — ABNORMAL HIGH (ref 70–99)

## 2013-10-31 LAB — CREATININE, SERUM
CREATININE: 1.08 mg/dL (ref 0.50–1.35)
GFR calc non Af Amer: 73 mL/min — ABNORMAL LOW (ref >90)
GFR, EST AFRICAN AMERICAN: 85 mL/min — AB (ref >90)

## 2013-10-31 LAB — PLATELET COUNT: Platelets: 159 10*3/uL (ref 150–400)

## 2013-10-31 LAB — APTT
APTT: 41 s — AB (ref 24–37)
aPTT: 42 seconds — ABNORMAL HIGH (ref 24–37)

## 2013-10-31 LAB — HEMOGLOBIN AND HEMATOCRIT, BLOOD
HCT: 26.4 % — ABNORMAL LOW (ref 39.0–52.0)
Hemoglobin: 9.1 g/dL — ABNORMAL LOW (ref 13.0–17.0)

## 2013-10-31 LAB — MAGNESIUM: Magnesium: 3.1 mg/dL — ABNORMAL HIGH (ref 1.5–2.5)

## 2013-10-31 SURGERY — CORONARY ARTERY BYPASS GRAFTING (CABG)
Anesthesia: General | Site: Chest

## 2013-10-31 MED ORDER — METOPROLOL TARTRATE 12.5 MG HALF TABLET
12.5000 mg | ORAL_TABLET | Freq: Two times a day (BID) | ORAL | Status: DC
  Administered 2013-10-31: 12.5 mg via ORAL
  Filled 2013-10-31 (×3): qty 1

## 2013-10-31 MED ORDER — SODIUM CHLORIDE 0.9 % IV SOLN
INTRAVENOUS | Status: DC
  Administered 2013-10-31 (×2): 20 mL/h via INTRAVENOUS

## 2013-10-31 MED ORDER — ROCURONIUM BROMIDE 50 MG/5ML IV SOLN
INTRAVENOUS | Status: AC
  Filled 2013-10-31: qty 2

## 2013-10-31 MED ORDER — THROMBIN 20000 UNITS EX SOLR
OROMUCOSAL | Status: DC | PRN
  Administered 2013-10-31: 08:00:00 via TOPICAL

## 2013-10-31 MED ORDER — PROTAMINE SULFATE 10 MG/ML IV SOLN
INTRAVENOUS | Status: AC
  Filled 2013-10-31: qty 25

## 2013-10-31 MED ORDER — MOMETASONE FURO-FORMOTEROL FUM 100-5 MCG/ACT IN AERO
2.0000 | INHALATION_SPRAY | Freq: Two times a day (BID) | RESPIRATORY_TRACT | Status: DC
  Administered 2013-11-01 – 2013-11-05 (×9): 2 via RESPIRATORY_TRACT
  Filled 2013-10-31: qty 8.8

## 2013-10-31 MED ORDER — PROPOFOL 10 MG/ML IV BOLUS
INTRAVENOUS | Status: DC | PRN
  Administered 2013-10-31: 70 mg via INTRAVENOUS

## 2013-10-31 MED ORDER — LACTATED RINGERS IV SOLN
INTRAVENOUS | Status: DC | PRN
  Administered 2013-10-31 (×2): via INTRAVENOUS

## 2013-10-31 MED ORDER — ALBUTEROL SULFATE (2.5 MG/3ML) 0.083% IN NEBU
2.5000 mg | INHALATION_SOLUTION | Freq: Four times a day (QID) | RESPIRATORY_TRACT | Status: DC
  Administered 2013-11-01: 2.5 mg via RESPIRATORY_TRACT
  Filled 2013-10-31 (×2): qty 3

## 2013-10-31 MED ORDER — ASPIRIN 81 MG PO CHEW: 324.0000 mg | CHEWABLE_TABLET | Freq: Every day | ORAL | Status: DC

## 2013-10-31 MED ORDER — FENTANYL CITRATE 0.05 MG/ML IJ SOLN
INTRAMUSCULAR | Status: DC | PRN
  Administered 2013-10-31: 250 ug via INTRAVENOUS
  Administered 2013-10-31: 150 ug via INTRAVENOUS
  Administered 2013-10-31: 450 ug via INTRAVENOUS
  Administered 2013-10-31 (×3): 50 ug via INTRAVENOUS
  Administered 2013-10-31: 150 ug via INTRAVENOUS
  Administered 2013-10-31: 100 ug via INTRAVENOUS
  Administered 2013-10-31: 150 ug via INTRAVENOUS
  Administered 2013-10-31: 100 ug via INTRAVENOUS

## 2013-10-31 MED ORDER — FENTANYL CITRATE 0.05 MG/ML IJ SOLN
INTRAMUSCULAR | Status: AC
  Filled 2013-10-31: qty 5

## 2013-10-31 MED ORDER — EPHEDRINE SULFATE 50 MG/ML IJ SOLN
INTRAMUSCULAR | Status: DC | PRN
  Administered 2013-10-31: 10 mg via INTRAVENOUS

## 2013-10-31 MED ORDER — NITROGLYCERIN IN D5W 200-5 MCG/ML-% IV SOLN
0.0000 ug/min | INTRAVENOUS | Status: DC
  Administered 2013-10-31: 40 ug/min via INTRAVENOUS

## 2013-10-31 MED ORDER — ACETAMINOPHEN 160 MG/5ML PO SOLN: 650.0000 mg | Freq: Once | ORAL | Status: AC

## 2013-10-31 MED ORDER — ACETAMINOPHEN 650 MG RE SUPP
650.0000 mg | Freq: Once | RECTAL | Status: AC
  Administered 2013-10-31: 650 mg via RECTAL

## 2013-10-31 MED ORDER — ACETAMINOPHEN 500 MG PO TABS
1000.0000 mg | ORAL_TABLET | Freq: Four times a day (QID) | ORAL | Status: DC
  Administered 2013-10-31 – 2013-11-05 (×18): 1000 mg via ORAL
  Filled 2013-10-31 (×23): qty 2

## 2013-10-31 MED ORDER — ACETAMINOPHEN 160 MG/5ML PO SOLN: 1000.0000 mg | Freq: Four times a day (QID) | ORAL | Status: DC

## 2013-10-31 MED ORDER — INSULIN REGULAR BOLUS VIA INFUSION
0.0000 [IU] | Freq: Three times a day (TID) | INTRAVENOUS | Status: DC
  Filled 2013-10-31: qty 10

## 2013-10-31 MED ORDER — SODIUM CHLORIDE 0.9 % IV SOLN
INTRAVENOUS | Status: DC
  Filled 2013-10-31: qty 1

## 2013-10-31 MED ORDER — HEMOSTATIC AGENTS (NO CHARGE) OPTIME
TOPICAL | Status: DC | PRN
  Administered 2013-10-31: 1 via TOPICAL

## 2013-10-31 MED ORDER — GLYCOPYRROLATE 0.2 MG/ML IJ SOLN
INTRAMUSCULAR | Status: AC
  Filled 2013-10-31: qty 2

## 2013-10-31 MED ORDER — DEXTROSE 5 % IV SOLN
1.5000 g | Freq: Two times a day (BID) | INTRAVENOUS | Status: AC
  Administered 2013-10-31 – 2013-11-02 (×4): 1.5 g via INTRAVENOUS
  Filled 2013-10-31 (×4): qty 1.5

## 2013-10-31 MED ORDER — PHENYLEPHRINE HCL 10 MG/ML IJ SOLN
0.0000 ug/min | INTRAVENOUS | Status: DC
  Filled 2013-10-31: qty 2

## 2013-10-31 MED ORDER — ASPIRIN EC 325 MG PO TBEC
325.0000 mg | DELAYED_RELEASE_TABLET | Freq: Every day | ORAL | Status: DC
  Administered 2013-11-01 – 2013-11-05 (×5): 325 mg via ORAL
  Filled 2013-10-31 (×5): qty 1

## 2013-10-31 MED ORDER — FAMOTIDINE IN NACL 20-0.9 MG/50ML-% IV SOLN
20.0000 mg | Freq: Two times a day (BID) | INTRAVENOUS | Status: AC
  Administered 2013-10-31 (×2): 20 mg via INTRAVENOUS
  Filled 2013-10-31: qty 50

## 2013-10-31 MED ORDER — METOPROLOL TARTRATE 1 MG/ML IV SOLN: 2.5000 mg | INTRAVENOUS | Status: DC | PRN

## 2013-10-31 MED ORDER — LIDOCAINE HCL (CARDIAC) 20 MG/ML IV SOLN
INTRAVENOUS | Status: DC | PRN
  Administered 2013-10-31: 60 mg via INTRAVENOUS

## 2013-10-31 MED ORDER — VANCOMYCIN HCL IN DEXTROSE 1-5 GM/200ML-% IV SOLN
1000.0000 mg | Freq: Once | INTRAVENOUS | Status: AC
  Administered 2013-10-31: 1000 mg via INTRAVENOUS
  Filled 2013-10-31: qty 200

## 2013-10-31 MED ORDER — LACTATED RINGERS IV SOLN
INTRAVENOUS | Status: DC | PRN
  Administered 2013-10-31: 07:00:00 via INTRAVENOUS

## 2013-10-31 MED ORDER — ALBUTEROL SULFATE HFA 108 (90 BASE) MCG/ACT IN AERS
INHALATION_SPRAY | RESPIRATORY_TRACT | Status: DC | PRN
  Administered 2013-10-31: 4 via RESPIRATORY_TRACT
  Administered 2013-10-31: 2 via RESPIRATORY_TRACT

## 2013-10-31 MED ORDER — 0.9 % SODIUM CHLORIDE (POUR BTL) OPTIME
TOPICAL | Status: DC | PRN
  Administered 2013-10-31: 6000 mL

## 2013-10-31 MED ORDER — LIDOCAINE HCL (CARDIAC) 20 MG/ML IV SOLN
INTRAVENOUS | Status: AC
  Filled 2013-10-31: qty 5

## 2013-10-31 MED ORDER — MAGNESIUM SULFATE 4000MG/100ML IJ SOLN
4.0000 g | Freq: Once | INTRAMUSCULAR | Status: AC
  Administered 2013-10-31: 4 g via INTRAVENOUS
  Filled 2013-10-31: qty 100

## 2013-10-31 MED ORDER — CALCIUM CHLORIDE 10 % IV SOLN
INTRAVENOUS | Status: AC
  Filled 2013-10-31: qty 10

## 2013-10-31 MED ORDER — METOPROLOL TARTRATE 25 MG/10 ML ORAL SUSPENSION
12.5000 mg | Freq: Two times a day (BID) | ORAL | Status: DC
  Filled 2013-10-31 (×3): qty 5

## 2013-10-31 MED ORDER — MIDAZOLAM HCL 10 MG/2ML IJ SOLN
INTRAMUSCULAR | Status: AC
  Filled 2013-10-31: qty 2

## 2013-10-31 MED ORDER — ARTIFICIAL TEARS OP OINT
TOPICAL_OINTMENT | OPHTHALMIC | Status: DC | PRN
  Administered 2013-10-31: 1 via OPHTHALMIC

## 2013-10-31 MED ORDER — PROTAMINE SULFATE 10 MG/ML IV SOLN
INTRAVENOUS | Status: DC | PRN
  Administered 2013-10-31: 380 mg via INTRAVENOUS

## 2013-10-31 MED ORDER — SODIUM CHLORIDE 0.9 % IJ SOLN
3.0000 mL | INTRAMUSCULAR | Status: DC | PRN
  Administered 2013-10-31: 19:00:00 via INTRAVENOUS

## 2013-10-31 MED ORDER — ALBUMIN HUMAN 5 % IV SOLN
250.0000 mL | INTRAVENOUS | Status: AC | PRN
  Administered 2013-10-31 (×2): 250 mL via INTRAVENOUS

## 2013-10-31 MED ORDER — INSULIN ASPART 100 UNIT/ML ~~LOC~~ SOLN
0.0000 [IU] | SUBCUTANEOUS | Status: DC
Start: 2013-10-31 — End: 2013-11-02
  Administered 2013-11-01 (×3): 2 [IU] via SUBCUTANEOUS

## 2013-10-31 MED ORDER — BISACODYL 10 MG RE SUPP: 10.0000 mg | Freq: Every day | RECTAL | Status: DC

## 2013-10-31 MED ORDER — HEPARIN SODIUM (PORCINE) 1000 UNIT/ML IJ SOLN
INTRAMUSCULAR | Status: DC | PRN
  Administered 2013-10-31: 36000 [IU] via INTRAVENOUS
  Administered 2013-10-31: 2000 [IU] via INTRAVENOUS

## 2013-10-31 MED ORDER — ATORVASTATIN CALCIUM 80 MG PO TABS
80.0000 mg | ORAL_TABLET | Freq: Every day | ORAL | Status: DC
  Administered 2013-11-01 – 2013-11-03 (×3): 80 mg via ORAL
  Filled 2013-10-31 (×5): qty 1

## 2013-10-31 MED ORDER — HEPARIN SODIUM (PORCINE) 1000 UNIT/ML IJ SOLN
INTRAMUSCULAR | Status: AC
  Filled 2013-10-31: qty 1

## 2013-10-31 MED ORDER — ONDANSETRON HCL 4 MG/2ML IJ SOLN: 4.0000 mg | Freq: Four times a day (QID) | INTRAMUSCULAR | Status: DC | PRN

## 2013-10-31 MED ORDER — PANTOPRAZOLE SODIUM 40 MG PO TBEC
40.0000 mg | DELAYED_RELEASE_TABLET | Freq: Every day | ORAL | Status: DC
  Administered 2013-11-02 – 2013-11-05 (×4): 40 mg via ORAL
  Filled 2013-10-31 (×4): qty 1

## 2013-10-31 MED ORDER — ROCURONIUM BROMIDE 50 MG/5ML IV SOLN
INTRAVENOUS | Status: AC
  Filled 2013-10-31: qty 1

## 2013-10-31 MED ORDER — LACTATED RINGERS IV SOLN: INTRAVENOUS | Status: DC

## 2013-10-31 MED ORDER — DEXMEDETOMIDINE HCL IN NACL 200 MCG/50ML IV SOLN
0.1000 ug/kg/h | INTRAVENOUS | Status: DC
Start: 2013-10-31 — End: 2013-11-01
  Administered 2013-10-31: 0.7 ug/kg/h via INTRAVENOUS
  Filled 2013-10-31: qty 50

## 2013-10-31 MED ORDER — PROPOFOL 10 MG/ML IV BOLUS
INTRAVENOUS | Status: AC
  Filled 2013-10-31: qty 20

## 2013-10-31 MED ORDER — OXYCODONE HCL 5 MG PO TABS
5.0000 mg | ORAL_TABLET | ORAL | Status: DC | PRN
  Administered 2013-10-31 – 2013-11-03 (×3): 10 mg via ORAL
  Filled 2013-10-31 (×3): qty 2

## 2013-10-31 MED ORDER — ALBUMIN HUMAN 5 % IV SOLN
INTRAVENOUS | Status: DC | PRN
  Administered 2013-10-31 (×2): via INTRAVENOUS

## 2013-10-31 MED ORDER — LACTATED RINGERS IV SOLN: 500.0000 mL | Freq: Once | INTRAVENOUS | Status: AC | PRN

## 2013-10-31 MED ORDER — SUCCINYLCHOLINE CHLORIDE 20 MG/ML IJ SOLN
INTRAMUSCULAR | Status: AC
  Filled 2013-10-31: qty 1

## 2013-10-31 MED ORDER — CALCIUM CHLORIDE 10 % IV SOLN
INTRAVENOUS | Status: DC | PRN
  Administered 2013-10-31: 200 mg via INTRAVENOUS
  Administered 2013-10-31: 100 mg via INTRAVENOUS
  Administered 2013-10-31: 400 mg via INTRAVENOUS

## 2013-10-31 MED ORDER — MORPHINE SULFATE 2 MG/ML IJ SOLN
2.0000 mg | INTRAMUSCULAR | Status: DC | PRN
  Administered 2013-11-01: 2 mg via INTRAVENOUS
  Administered 2013-11-01: 4 mg via INTRAVENOUS
  Filled 2013-10-31: qty 1
  Filled 2013-10-31: qty 2
  Filled 2013-10-31: qty 1

## 2013-10-31 MED ORDER — POTASSIUM CHLORIDE 10 MEQ/50ML IV SOLN: 10.0000 meq | INTRAVENOUS | Status: AC

## 2013-10-31 MED ORDER — BISACODYL 5 MG PO TBEC
10.0000 mg | DELAYED_RELEASE_TABLET | Freq: Every day | ORAL | Status: DC
  Administered 2013-11-01 – 2013-11-04 (×3): 10 mg via ORAL
  Filled 2013-10-31 (×4): qty 2

## 2013-10-31 MED ORDER — ROCURONIUM BROMIDE 100 MG/10ML IV SOLN
INTRAVENOUS | Status: DC | PRN
  Administered 2013-10-31 (×3): 50 mg via INTRAVENOUS
  Administered 2013-10-31: 100 mg via INTRAVENOUS

## 2013-10-31 MED ORDER — ALBUTEROL SULFATE (2.5 MG/3ML) 0.083% IN NEBU: 2.5000 mg | INHALATION_SOLUTION | Freq: Four times a day (QID) | RESPIRATORY_TRACT | Status: DC

## 2013-10-31 MED ORDER — MORPHINE SULFATE 2 MG/ML IJ SOLN
1.0000 mg | INTRAMUSCULAR | Status: AC | PRN
  Administered 2013-10-31: 2 mg via INTRAVENOUS

## 2013-10-31 MED ORDER — ALBUTEROL SULFATE (2.5 MG/3ML) 0.083% IN NEBU
2.5000 mg | INHALATION_SOLUTION | RESPIRATORY_TRACT | Status: DC | PRN
  Administered 2013-10-31: 2.5 mg via RESPIRATORY_TRACT
  Filled 2013-10-31: qty 3

## 2013-10-31 MED ORDER — MIDAZOLAM HCL 2 MG/2ML IJ SOLN: 2.0000 mg | INTRAMUSCULAR | Status: DC | PRN

## 2013-10-31 MED ORDER — MIDAZOLAM HCL 5 MG/5ML IJ SOLN
INTRAMUSCULAR | Status: DC | PRN
  Administered 2013-10-31: 1 mg via INTRAVENOUS
  Administered 2013-10-31: 2 mg via INTRAVENOUS
  Administered 2013-10-31: 1 mg via INTRAVENOUS
  Administered 2013-10-31: 3 mg via INTRAVENOUS
  Administered 2013-10-31: 2 mg via INTRAVENOUS
  Administered 2013-10-31: 1 mg via INTRAVENOUS

## 2013-10-31 MED ORDER — ARTIFICIAL TEARS OP OINT
TOPICAL_OINTMENT | OPHTHALMIC | Status: AC
  Filled 2013-10-31: qty 3.5

## 2013-10-31 MED ORDER — SODIUM CHLORIDE 0.45 % IV SOLN
INTRAVENOUS | Status: DC
  Administered 2013-10-31: 20 mL/h via INTRAVENOUS

## 2013-10-31 MED ORDER — EPHEDRINE SULFATE 50 MG/ML IJ SOLN
INTRAMUSCULAR | Status: AC
  Filled 2013-10-31: qty 1

## 2013-10-31 MED ORDER — SODIUM CHLORIDE 0.9 % IJ SOLN
3.0000 mL | Freq: Two times a day (BID) | INTRAMUSCULAR | Status: DC
  Administered 2013-11-01 – 2013-11-02 (×3): 3 mL via INTRAVENOUS

## 2013-10-31 MED ORDER — ALBUTEROL SULFATE HFA 108 (90 BASE) MCG/ACT IN AERS
INHALATION_SPRAY | RESPIRATORY_TRACT | Status: AC
  Filled 2013-10-31: qty 6.7

## 2013-10-31 MED ORDER — GLYCOPYRROLATE 0.2 MG/ML IJ SOLN
INTRAMUSCULAR | Status: DC | PRN
  Administered 2013-10-31: 0.4 mg via INTRAVENOUS

## 2013-10-31 MED ORDER — SODIUM CHLORIDE 0.9 % IV SOLN: 250.0000 mL | INTRAVENOUS | Status: DC

## 2013-10-31 MED ORDER — DOCUSATE SODIUM 100 MG PO CAPS
200.0000 mg | ORAL_CAPSULE | Freq: Every day | ORAL | Status: DC
  Administered 2013-11-01 – 2013-11-05 (×4): 200 mg via ORAL
  Filled 2013-10-31 (×5): qty 2

## 2013-10-31 SURGICAL SUPPLY — 83 items
ATTRACTOMAT 16X20 MAGNETIC DRP (DRAPES) ×4 IMPLANT
BAG DECANTER FOR FLEXI CONT (MISCELLANEOUS) ×4 IMPLANT
BANDAGE ELASTIC 4 VELCRO ST LF (GAUZE/BANDAGES/DRESSINGS) ×4 IMPLANT
BANDAGE ELASTIC 6 VELCRO ST LF (GAUZE/BANDAGES/DRESSINGS) ×4 IMPLANT
BANDAGE GAUZE ELAST BULKY 4 IN (GAUZE/BANDAGES/DRESSINGS) ×4 IMPLANT
BASKET HEART  (ORDER IN 25'S) (MISCELLANEOUS) ×1
BASKET HEART (ORDER IN 25'S) (MISCELLANEOUS) ×1
BASKET HEART (ORDER IN 25S) (MISCELLANEOUS) ×2 IMPLANT
BLADE STERNUM SYSTEM 6 (BLADE) ×4 IMPLANT
BNDG GAUZE ELAST 4 BULKY (GAUZE/BANDAGES/DRESSINGS) ×2 IMPLANT
CANISTER SUCTION 2500CC (MISCELLANEOUS) ×4 IMPLANT
CANNULA EZ GLIDE AORTIC 21FR (CANNULA) ×4 IMPLANT
CARDIAC SUCTION (MISCELLANEOUS) ×4 IMPLANT
CATH CPB KIT HENDRICKSON (MISCELLANEOUS) ×4 IMPLANT
CATH ROBINSON RED A/P 18FR (CATHETERS) ×4 IMPLANT
CATH THORACIC 36FR (CATHETERS) ×4 IMPLANT
CATH THORACIC 36FR RT ANG (CATHETERS) ×4 IMPLANT
CLIP TI MEDIUM 24 (CLIP) IMPLANT
CLIP TI WIDE RED SMALL 24 (CLIP) ×6 IMPLANT
COVER SURGICAL LIGHT HANDLE (MISCELLANEOUS) ×4 IMPLANT
CRADLE DONUT ADULT HEAD (MISCELLANEOUS) ×4 IMPLANT
DRAPE CARDIOVASCULAR INCISE (DRAPES) ×4
DRAPE SLUSH/WARMER DISC (DRAPES) ×4 IMPLANT
DRAPE SRG 135X102X78XABS (DRAPES) ×2 IMPLANT
DRSG COVADERM 4X14 (GAUZE/BANDAGES/DRESSINGS) ×4 IMPLANT
ELECT REM PT RETURN 9FT ADLT (ELECTROSURGICAL) ×8
ELECTRODE REM PT RTRN 9FT ADLT (ELECTROSURGICAL) ×4 IMPLANT
GLOVE EUDERMIC 7 POWDERFREE (GLOVE) ×12 IMPLANT
GOWN STRL REUS W/ TWL LRG LVL3 (GOWN DISPOSABLE) ×8 IMPLANT
GOWN STRL REUS W/ TWL XL LVL3 (GOWN DISPOSABLE) ×4 IMPLANT
GOWN STRL REUS W/TWL LRG LVL3 (GOWN DISPOSABLE) ×16
GOWN STRL REUS W/TWL XL LVL3 (GOWN DISPOSABLE) ×8
HEMOSTAT POWDER SURGIFOAM 1G (HEMOSTASIS) ×12 IMPLANT
HEMOSTAT SURGICEL 2X14 (HEMOSTASIS) ×4 IMPLANT
INSERT FOGARTY XLG (MISCELLANEOUS) IMPLANT
KIT BASIN OR (CUSTOM PROCEDURE TRAY) ×4 IMPLANT
KIT ROOM TURNOVER OR (KITS) ×4 IMPLANT
KIT SUCTION CATH 14FR (SUCTIONS) ×8 IMPLANT
KIT VASOVIEW W/TROCAR VH 2000 (KITS) ×4 IMPLANT
MARKER GRAFT CORONARY BYPASS (MISCELLANEOUS) ×12 IMPLANT
NS IRRIG 1000ML POUR BTL (IV SOLUTION) ×20 IMPLANT
PACK OPEN HEART (CUSTOM PROCEDURE TRAY) ×4 IMPLANT
PAD ARMBOARD 7.5X6 YLW CONV (MISCELLANEOUS) ×8 IMPLANT
PAD ELECT DEFIB RADIOL ZOLL (MISCELLANEOUS) ×4 IMPLANT
PENCIL BUTTON HOLSTER BLD 10FT (ELECTRODE) ×4 IMPLANT
PUNCH AORTIC ROTATE 4.0MM (MISCELLANEOUS) IMPLANT
PUNCH AORTIC ROTATE 4.5MM 8IN (MISCELLANEOUS) ×2 IMPLANT
PUNCH AORTIC ROTATE 5MM 8IN (MISCELLANEOUS) IMPLANT
SET CARDIOPLEGIA MPS 5001102 (MISCELLANEOUS) ×3 IMPLANT
SOLUTION ANTI FOG 6CC (MISCELLANEOUS) ×2 IMPLANT
SPONGE GAUZE 4X4 12PLY (GAUZE/BANDAGES/DRESSINGS) ×8 IMPLANT
SPONGE GAUZE 4X4 12PLY STER LF (GAUZE/BANDAGES/DRESSINGS) ×6 IMPLANT
SUT BONE WAX W31G (SUTURE) ×4 IMPLANT
SUT MNCRL AB 4-0 PS2 18 (SUTURE) IMPLANT
SUT PROLENE 3 0 SH DA (SUTURE) ×4 IMPLANT
SUT PROLENE 4 0 RB 1 (SUTURE)
SUT PROLENE 4 0 SH DA (SUTURE) IMPLANT
SUT PROLENE 4-0 RB1 .5 CRCL 36 (SUTURE) IMPLANT
SUT PROLENE 6 0 C 1 30 (SUTURE) ×12 IMPLANT
SUT PROLENE 7 0 BV1 MDA (SUTURE) ×8 IMPLANT
SUT PROLENE 8 0 BV175 6 (SUTURE) IMPLANT
SUT STEEL 6MS V (SUTURE) ×4 IMPLANT
SUT STEEL STERNAL CCS#1 18IN (SUTURE) IMPLANT
SUT STEEL SZ 6 DBL 3X14 BALL (SUTURE) ×4 IMPLANT
SUT VIC AB 1 CTX 36 (SUTURE) ×8
SUT VIC AB 1 CTX36XBRD ANBCTR (SUTURE) ×4 IMPLANT
SUT VIC AB 2-0 CT1 27 (SUTURE) ×4
SUT VIC AB 2-0 CT1 TAPERPNT 27 (SUTURE) ×1 IMPLANT
SUT VIC AB 2-0 CTX 27 (SUTURE) IMPLANT
SUT VIC AB 3-0 SH 27 (SUTURE)
SUT VIC AB 3-0 SH 27X BRD (SUTURE) IMPLANT
SUT VIC AB 3-0 X1 27 (SUTURE) ×3 IMPLANT
SUT VICRYL 4-0 PS2 18IN ABS (SUTURE) IMPLANT
SUTURE E-PAK OPEN HEART (SUTURE) ×4 IMPLANT
SYSTEM SAHARA CHEST DRAIN ATS (WOUND CARE) ×4 IMPLANT
TAPE CLOTH SURG 4X10 WHT LF (GAUZE/BANDAGES/DRESSINGS) ×2 IMPLANT
TOWEL OR 17X24 6PK STRL BLUE (TOWEL DISPOSABLE) ×8 IMPLANT
TOWEL OR 17X26 10 PK STRL BLUE (TOWEL DISPOSABLE) ×8 IMPLANT
TRAY FOLEY IC TEMP SENS 16FR (CATHETERS) ×4 IMPLANT
TUBE FEEDING 8FR 16IN STR KANG (MISCELLANEOUS) ×4 IMPLANT
TUBING INSUFFLATION 10FT LAP (TUBING) ×4 IMPLANT
UNDERPAD 30X30 INCONTINENT (UNDERPADS AND DIAPERS) ×4 IMPLANT
WATER STERILE IRR 1000ML POUR (IV SOLUTION) ×8 IMPLANT

## 2013-10-31 NOTE — OR Nursing (Signed)
2nd call to SICU

## 2013-10-31 NOTE — Progress Notes (Signed)
Patient ID: [name], male   DOB: 1955-07-13, 58 y.o.   MRN: [id number]  SICU Evening Rounds:  Hemodynamically stable  Weaning on vent  Urine output good  CT output low.  CBC    Component Value Date/Time   WBC 11.1* 10/31/2013 1300   RBC 3.64* 10/31/2013 1300   HGB 11.9* 10/31/2013 1400   HCT 35.0* 10/31/2013 1400   PLT 154 10/31/2013 1300   MCV 88.5 10/31/2013 1300   MCH 29.1 10/31/2013 1300   MCHC 32.9 10/31/2013 1300   RDW 15.4 10/31/2013 1300   LYMPHSABS 1.2 05/18/2011 1848   MONOABS 0.8 05/18/2011 1848   EOSABS 0.0 05/18/2011 1848   BASOSABS 0.0 05/18/2011 1848    BMET    Component Value Date/Time   NA 139 10/31/2013 1400   K 4.7 10/31/2013 1400   CL 106 10/31/2013 0633   CO2 23 10/31/2013 0633   GLUCOSE 112* 10/31/2013 1400   BUN 12 10/31/2013 0633   CREATININE 1.10 10/31/2013 0633   CALCIUM 8.8 10/31/2013 0633   GFRNONAA 72* 10/31/2013 0633   GFRAA 83* 10/31/2013 0633    A/P: stable, extubate when ready.

## 2013-10-31 NOTE — Progress Notes (Signed)
RT note-increased RR to 14 per ABG result, cont to monitor.

## 2013-10-31 NOTE — Anesthesia Postprocedure Evaluation (Signed)
  Anesthesia Post-op Note  Patient: [name]  Procedure(s) Performed: Procedure(s): CORONARY ARTERY BYPASS GRAFTING TIMES FOUR ON PUMP USING LEFT INTERNAL MAMMARY ARTERY AND RIGHT GREATER SAPHENOUS VEIN VIA ENDOVEIN HARVEST. (N/A) INTRAOPERATIVE TRANSESOPHAGEAL ECHOCARDIOGRAM (N/A)  Patient Location: SICU  Anesthesia Type:General  Level of Consciousness: sedated and Patient remains intubated per anesthesia plan  Airway and Oxygen Therapy: Patient remains intubated per anesthesia plan and Patient placed on Ventilator (see vital sign flow sheet for setting)  Post-op Pain: none  Post-op Assessment: Post-op Vital signs reviewed, Patient's Cardiovascular Status Stable, Respiratory Function Stable, Patent Airway, No signs of Nausea or vomiting and Pain level controlled  Post-op Vital Signs: stable  Last Vitals:  Filed Vitals:   10/31/13 0553  BP: 123/75  Pulse: 83  Temp: 36.6 C  Resp: 20    Complications: No apparent anesthesia complications

## 2013-10-31 NOTE — Progress Notes (Signed)
RT note-SICU wean started at this time, slightly restless, agreed with RN to start wean at this time.

## 2013-10-31 NOTE — Brief Op Note (Addendum)
      [address]       [place] [address]             [phone]     10/26/2013 - 10/31/2013  12:09 PM  PATIENT:  Crist Infante  58 y.o. male  PRE-OPERATIVE DIAGNOSIS:  CAD  POST-OPERATIVE DIAGNOSIS:  coronary artery disease  PROCEDURE:  Procedure(s): CORONARY ARTERY BYPASS GRAFTING (CABG)X4 LIMA-LAD; SEQ SVG-OM1-OM2; SVG-PD INTRAOPERATIVE TRANSESOPHAGEAL ECHOCARDIOGRAM(TEE) EVH RIGHT LEG  SURGEON:  Surgeon(s): Melrose Nakayama, MD  PHYSICIAN ASSISTANT: WAYNE GOLD PA-C  ANESTHESIA:   general  PATIENT CONDITION:  ICU - intubated and hemodynamically stable.  PRE-OPERATIVE WEIGHT: 09GG  COMPLICATIONS: NO KNOWN  Findings: Severe emphysema with hyperinflated lungs  PDA fair quality better than expected  LAD and OM2 good quality  XC= 78 min CPB= 109 min

## 2013-10-31 NOTE — Transfer of Care (Signed)
Immediate Anesthesia Transfer of Care Note  Patient: [name]  Procedure(s) Performed: Procedure(s): CORONARY ARTERY BYPASS GRAFTING TIMES FOUR ON PUMP USING LEFT INTERNAL MAMMARY ARTERY AND RIGHT GREATER SAPHENOUS VEIN VIA ENDOVEIN HARVEST. (N/A) INTRAOPERATIVE TRANSESOPHAGEAL ECHOCARDIOGRAM (N/A)  Patient Location: SICU  Anesthesia Type:General  Level of Consciousness: sedated and Patient remains intubated per anesthesia plan  Airway & Oxygen Therapy: Patient remains intubated per anesthesia plan and Patient placed on Ventilator (see vital sign flow sheet for setting)  Post-op Assessment: Report given to PACU RN and Post -op Vital signs reviewed and stable  Post vital signs: Reviewed and stable  Complications: No apparent anesthesia complications

## 2013-10-31 NOTE — Procedures (Signed)
Extubation Procedure Note  Patient Details:   Name: [name] DOB: [birth year] MRN: [id number]   Airway Documentation:     Evaluation  O2 sats: stable throughout Complications: No apparent complications Patient did tolerate procedure well. Yes BBS equal with rhochi Extubated to nebulizer treatment NIF-30, FVC-1.1L   Revonda Standard 10/31/2013, 6:39 PM

## 2013-10-31 NOTE — Interval H&P Note (Signed)
History and Physical Interval Note:  10/31/2013 7:44 AM  [name]  has presented today for surgery, with the diagnosis of CAD  The various methods of treatment have been discussed with the patient and family. After consideration of risks, benefits and other options for treatment, the patient has consented to  Procedure(s): CORONARY ARTERY BYPASS GRAFTING (CABG) (N/A) INTRAOPERATIVE TRANSESOPHAGEAL ECHOCARDIOGRAM (N/A) as a surgical intervention .  The patient's history has been reviewed, patient examined, no change in status, stable for surgery.  I have reviewed the patient's chart and labs.  Questions were answered to the patient's satisfaction.     Melrose Nakayama

## 2013-10-31 NOTE — OR Nursing (Signed)
SICU 1st call @1215 , spoke with Tamela Oddi and [place].

## 2013-10-31 NOTE — Anesthesia Preprocedure Evaluation (Signed)
Anesthesia Evaluation  Patient identified by MRN, date of birth, ID band Patient awake    Reviewed: Allergy & Precautions, H&P , NPO status , Patient's Chart, lab work & pertinent test results  Airway       Dental   Pulmonary COPDCurrent Smoker,          Cardiovascular hypertension, + CAD and + Past MI     Neuro/Psych    GI/Hepatic PUD, (+)     substance abuse  cocaine use,   Endo/Other    Renal/GU      Musculoskeletal   Abdominal   Peds  Hematology   Anesthesia Other Findings   Reproductive/Obstetrics                           Anesthesia Physical Anesthesia Plan  ASA: III  Anesthesia Plan: General   Post-op Pain Management:    Induction: Intravenous  Airway Management Planned: Oral ETT  Additional Equipment: PA Cath, CVP, Arterial line and TEE  Intra-op Plan:   Post-operative Plan: Post-operative intubation/ventilation  Informed Consent: I have reviewed the patients History and Physical, chart, labs and discussed the procedure including the risks, benefits and alternatives for the proposed anesthesia with the patient or authorized representative who has indicated his/her understanding and acceptance.     Plan Discussed with:   Anesthesia Plan Comments:         Anesthesia Quick Evaluation

## 2013-10-31 NOTE — H&P (View-Only) (Signed)
Reason for Consult:3 vessel CAD, Class IV angina Referring Physician: Dr. Lucien Mons is an 58 y.o. male.  HPI: 58 yo with a cc/o chest pain.  [name] is a 58 yo male with a history of hypertension, hyperlipidemia, tobacco abuse and COPD. He also has a history of moderate EtOH consumption and occasional marijuana and cocaine use.   He has been feeling well with no warning signs leading up to this admission. On Friday he says he had a stressful day at work. That evening he had a couple of drinks and smoked cigarettes and marijuana. At 4 AM Saturday AM he was awakened from sleep by chest pressure. The pain radiated to his left arm. He was short of breath, diaphoretic and anxious. He called 911. EMS gave him aspirin and NTG SL and his pain resolved. He was pain free on arrival. His ECG showed some inferior ST depression. His initial troponin was negative but BNP was elevated at 327. He did subsequently r/i for a NSTEMI with a troponin of 0.95.  He has been pain free since admission. Today he underwent cardiac catheterization which revealed severe 3 vessel CAD.   Past Medical History  Diagnosis Date  . COPD (chronic obstructive pulmonary disease)   . Perforation of duodenal ulcer     a. 1970s - s/p surgery.   . H/O: GI bleed     a. 58 y/o ago, r/t ulcer. Denies recent GIB.  . H/O: eczema     lower extremities  . High cholesterol   . Hypertension   . Cocaine abuse   . Tobacco abuse     Past Surgical History  Procedure Laterality Date  . Stomach sx      Family History  Problem Relation Age of Onset  . Lung cancer      Social History:  reports that he has been smoking Cigarettes.  He has a 24 pack-year smoking history. He has never used smokeless tobacco. He reports that he drinks alcohol. He reports that he does not use illicit drugs.  Allergies: No Known Allergies  Medications:  Prior to Admission:  Prescriptions prior to admission  Medication Sig Dispense  Refill  . albuterol (PROAIR HFA) 108 (90 BASE) MCG/ACT inhaler Inhale 1 puff into the lungs every 6 (six) hours as needed for wheezing or shortness of breath.      . Fluticasone-Salmeterol (ADVAIR) 250-50 MCG/DOSE AEPB Inhale 1 puff into the lungs every 12 (twelve) hours.          Results for orders placed during the hospital encounter of 10/26/13 (from the past 48 hour(s))  TROPONIN I     Status: Abnormal   Collection Time    10/26/13  8:20 PM      Result Value Ref Range   Troponin I 0.95 (*) <0.30 ng/mL   Comment:            Due to the release kinetics of cTnI,     a negative result within the first hours     of the onset of symptoms does not rule out     myocardial infarction with certainty.     If myocardial infarction is still suspected,     repeat the test at appropriate intervals.     CRITICAL VALUE NOTED.  VALUE IS CONSISTENT WITH PREVIOUSLY REPORTED AND CALLED VALUE.  CBC     Status: Abnormal   Collection Time    10/27/13  5:17 AM  Result Value Ref Range   WBC 5.7  4.0 - 10.5 K/uL   RBC 4.43  4.22 - 5.81 MIL/uL   Hemoglobin 13.0  13.0 - 17.0 g/dL   HCT 38.5 (*) 39.0 - 52.0 %   MCV 86.9  78.0 - 100.0 fL   MCH 29.3  26.0 - 34.0 pg   MCHC 33.8  30.0 - 36.0 g/dL   RDW 15.1  11.5 - 15.5 %   Platelets 291  150 - 400 K/uL  CBC     Status: Abnormal   Collection Time    10/28/13  4:50 AM      Result Value Ref Range   WBC 6.5  4.0 - 10.5 K/uL   RBC 4.24  4.22 - 5.81 MIL/uL   Hemoglobin 12.5 (*) 13.0 - 17.0 g/dL   HCT 37.0 (*) 39.0 - 52.0 %   MCV 87.3  78.0 - 100.0 fL   MCH 29.5  26.0 - 34.0 pg   MCHC 33.8  30.0 - 36.0 g/dL   RDW 15.3  11.5 - 15.5 %   Platelets 291  150 - 400 K/uL  PROTIME-INR     Status: None   Collection Time    10/28/13  4:50 AM      Result Value Ref Range   Prothrombin Time 12.0  11.6 - 15.2 seconds   INR 0.90  0.00 - 1.49  HEPATIC FUNCTION PANEL     Status: Abnormal   Collection Time    10/28/13  9:30 AM      Result Value Ref Range    Total Protein 6.3  6.0 - 8.3 g/dL   Albumin 3.2 (*) 3.5 - 5.2 g/dL   AST 13  0 - 37 U/L   ALT 12  0 - 53 U/L   Alkaline Phosphatase 105  39 - 117 U/L   Total Bilirubin 0.4  0.3 - 1.2 mg/dL   Bilirubin, Direct <0.2  0.0 - 0.3 mg/dL   Indirect Bilirubin NOT CALCULATED  0.3 - 0.9 mg/dL    No results found.  Review of Systems  Constitutional: Negative for fever, chills and malaise/fatigue.  Respiratory: Negative for cough and shortness of breath.   Cardiovascular: Positive for chest pain. Negative for claudication and leg swelling.  Neurological: Negative for sensory change and focal weakness.  Endo/Heme/Allergies: Does not bruise/bleed easily.  All other systems reviewed and are negative.  Blood pressure 149/79, pulse 62, temperature 98 F (36.7 C), temperature source Oral, resp. rate 18, height 6' (1.829 m), weight 210 lb (95.255 kg), SpO2 97.00%. Physical Exam  Vitals reviewed. Constitutional: He is oriented to person, place, and time. He appears well-developed and well-nourished. No distress.  HENT:  Head: Normocephalic and atraumatic.  Eyes: EOM are normal. Pupils are equal, round, and reactive to light.  Neck: Neck supple. No thyromegaly present.  No carotid bruits  Cardiovascular: Normal rate, regular rhythm, normal heart sounds and intact distal pulses.  Exam reveals no gallop and no friction rub.   No murmur heard. Respiratory: Breath sounds normal. He has no wheezes. He has no rales.  GI: Soft. There is no tenderness.  Musculoskeletal: He exhibits no edema.  Lymphadenopathy:    He has no cervical adenopathy.  Neurological: He is alert and oriented to person, place, and time. No cranial nerve deficit.  No focal motor deficits  Skin: Skin is warm and dry.   CARDIAC CATHETERIZATION Hemodynamic Findings:  Central aortic pressure: 120/69  Left ventricular pressure: 124/12/16  Angiographic  Findings:  Left main: No obstructive disease.  Left Anterior Descending Artery:  Large caliber vessel that courses to the apex. The proximal has long hazy 80% stenosis. There is mild plaque in the mid and distal vessel. Small caliber first diagonal 70% stenosis.  Circumflex Artery: Large caliber vessel with proximal 99% stenosis. The first obtuse marginal branch is moderate in caliber with no obstructive disease. The second obtuse marginal branch is moderate in caliber with proximal 70% stenosis. The distal AV groove Circumflex has 50% stenosis.  Right Coronary Artery: Large dominant vessel with diffuse 40% mid stenosis followed by a focal 70-80% mid stenosis. The distal vessel has mild plaque disease. The PDA is a moderate caliber vessel with serial 40% stenoses.  Left Ventricular Angiogram: LVEF=55-60%  Impression:  1. NSTEMI  2. Severe triple vessel CAD  3. History of questionable medical compliance, substance abuse (cocaine)  4. Normal LV function  Recommendations: Given triple vessel CAD involving the proximal LAD, proximal Circumflex and RCA along with questionable compliance and recent cocaine use, I think CABG is the best revascularization option. All of his lesions could be stented as most severe disease is fairly focal but I do not think multiple drug eluting stents is a safe option in this patient given his compliance and substance abuse issues. Will ask CT surgery to see and review his films. He seems to be a good candidate for CABG. Continue ASA, statin. His beta blocker has been held with UDS positive for cocaine.    Assessment/Plan: 58 yo man with multiple CRF who presented with an unstable coronary syndrome and ruled in for a non-STEMI. At cath he has severe 3 vessel CAD with preserved LV function. CABG is indicated for survival benefit and relief of symptoms.   I discussed with Mr. [name] the general nature of the procedure, the need for general anesthesia, the use of cardiopulmonary bypass, and the incisions to be used. We discussed the expected hospital stay,  overall recovery and short and long term outcomes. He understands the risks include, but are not limited to death, stroke, MI, DVT/PE, bleeding, possible need for transfusion, infections, cardiac arrhythmias, pleural effusions, and other organ system dysfunction, including respiratory, renal, or GI complications. He wishes to proceed.  He needs carotid dopplers and PFTs   At present first available OR is Thursday 5/21  Melrose Nakayama 10/28/2013, 5:10 PM

## 2013-11-01 ENCOUNTER — Encounter (HOSPITAL_COMMUNITY): Payer: Self-pay | Admitting: Thoracic Surgery (Cardiothoracic Vascular Surgery)

## 2013-11-01 ENCOUNTER — Inpatient Hospital Stay (HOSPITAL_COMMUNITY): Payer: No Typology Code available for payment source

## 2013-11-01 LAB — BASIC METABOLIC PANEL
BUN: 11 mg/dL (ref 6–23)
CHLORIDE: 103 meq/L (ref 96–112)
CO2: 22 mEq/L (ref 19–32)
Calcium: 8.2 mg/dL — ABNORMAL LOW (ref 8.4–10.5)
Creatinine, Ser: 1.03 mg/dL (ref 0.50–1.35)
GFR, EST AFRICAN AMERICAN: 90 mL/min — AB (ref >90)
GFR, EST NON AFRICAN AMERICAN: 78 mL/min — AB (ref >90)
Glucose, Bld: 126 mg/dL — ABNORMAL HIGH (ref 70–99)
POTASSIUM: 4.4 meq/L (ref 3.7–5.3)
SODIUM: 135 meq/L — AB (ref 137–147)

## 2013-11-01 LAB — POCT I-STAT, CHEM 8
BUN: 9 mg/dL (ref 6–23)
CREATININE: 1.2 mg/dL (ref 0.50–1.35)
Calcium, Ion: 1.23 mmol/L (ref 1.12–1.23)
Chloride: 97 mEq/L (ref 96–112)
Glucose, Bld: 135 mg/dL — ABNORMAL HIGH (ref 70–99)
HCT: 35 % — ABNORMAL LOW (ref 39.0–52.0)
Hemoglobin: 11.9 g/dL — ABNORMAL LOW (ref 13.0–17.0)
Potassium: 3.9 mEq/L (ref 3.7–5.3)
SODIUM: 134 meq/L — AB (ref 137–147)
TCO2: 24 mmol/L (ref 0–100)

## 2013-11-01 LAB — GLUCOSE, CAPILLARY
GLUCOSE-CAPILLARY: 122 mg/dL — AB (ref 70–99)
Glucose-Capillary: 111 mg/dL — ABNORMAL HIGH (ref 70–99)
Glucose-Capillary: 119 mg/dL — ABNORMAL HIGH (ref 70–99)
Glucose-Capillary: 124 mg/dL — ABNORMAL HIGH (ref 70–99)
Glucose-Capillary: 127 mg/dL — ABNORMAL HIGH (ref 70–99)
Glucose-Capillary: 152 mg/dL — ABNORMAL HIGH (ref 70–99)

## 2013-11-01 LAB — CBC
HCT: 31.4 % — ABNORMAL LOW (ref 39.0–52.0)
HEMATOCRIT: 31.5 % — AB (ref 39.0–52.0)
Hemoglobin: 10.7 g/dL — ABNORMAL LOW (ref 13.0–17.0)
Hemoglobin: 10.9 g/dL — ABNORMAL LOW (ref 13.0–17.0)
MCH: 29.6 pg (ref 26.0–34.0)
MCH: 30.3 pg (ref 26.0–34.0)
MCHC: 34 g/dL (ref 30.0–36.0)
MCHC: 34.7 g/dL (ref 30.0–36.0)
MCV: 87 fL (ref 78.0–100.0)
MCV: 87.2 fL (ref 78.0–100.0)
PLATELETS: 178 10*3/uL (ref 150–400)
PLATELETS: 165 10*3/uL (ref 150–400)
RBC: 3.62 MIL/uL — ABNORMAL LOW (ref 4.22–5.81)
RBC: 3.6 MIL/uL — ABNORMAL LOW (ref 4.22–5.81)
RDW: 15.3 % (ref 11.5–15.5)
RDW: 15.3 % (ref 11.5–15.5)
WBC: 9.1 10*3/uL (ref 4.0–10.5)
WBC: 11 10*3/uL — ABNORMAL HIGH (ref 4.0–10.5)

## 2013-11-01 LAB — MAGNESIUM
MAGNESIUM: 2.3 mg/dL (ref 1.5–2.5)
MAGNESIUM: 2.2 mg/dL (ref 1.5–2.5)

## 2013-11-01 MED ORDER — INSULIN ASPART 100 UNIT/ML ~~LOC~~ SOLN
0.0000 [IU] | SUBCUTANEOUS | Status: DC
  Administered 2013-11-01 – 2013-11-02 (×2): 2 [IU] via SUBCUTANEOUS

## 2013-11-01 MED ORDER — INSULIN ASPART 100 UNIT/ML ~~LOC~~ SOLN: 0.0000 [IU] | SUBCUTANEOUS | Status: DC

## 2013-11-01 MED ORDER — ENOXAPARIN SODIUM 40 MG/0.4ML ~~LOC~~ SOLN: 40.0000 mg | Freq: Every day | SUBCUTANEOUS | Status: DC

## 2013-11-01 MED ORDER — CARVEDILOL 6.25 MG PO TABS
6.2500 mg | ORAL_TABLET | Freq: Two times a day (BID) | ORAL | Status: DC
  Administered 2013-11-01 – 2013-11-02 (×3): 6.25 mg via ORAL
  Filled 2013-11-01 (×5): qty 1

## 2013-11-01 MED ORDER — IPRATROPIUM-ALBUTEROL 0.5-2.5 (3) MG/3ML IN SOLN
3.0000 mL | RESPIRATORY_TRACT | Status: DC
  Administered 2013-11-01: 3 mL via RESPIRATORY_TRACT
  Filled 2013-11-01: qty 3

## 2013-11-01 MED ORDER — FUROSEMIDE 10 MG/ML IJ SOLN
20.0000 mg | Freq: Once | INTRAMUSCULAR | Status: AC
  Administered 2013-11-01: 20 mg via INTRAVENOUS
  Filled 2013-11-01: qty 2

## 2013-11-01 MED ORDER — IPRATROPIUM-ALBUTEROL 20-100 MCG/ACT IN AERS: 2.0000 | INHALATION_SPRAY | Freq: Four times a day (QID) | RESPIRATORY_TRACT | Status: DC

## 2013-11-01 MED FILL — Heparin Sodium (Porcine) Inj 1000 Unit/ML: INTRAMUSCULAR | Qty: 30 | Status: AC

## 2013-11-01 MED FILL — Magnesium Sulfate Inj 50%: INTRAMUSCULAR | Qty: 10 | Status: AC

## 2013-11-01 MED FILL — Potassium Chloride Inj 2 mEq/ML: INTRAVENOUS | Qty: 40 | Status: AC

## 2013-11-01 NOTE — Discharge Summary (Signed)
Physician Discharge Summary  Patient ID: [name] MRN: [id number] DOB/AGE: 16-Mar-1956 58 y.o.  Admit date: 10/26/2013 Discharge date:11/05/2013  Admission Diagnoses:  Patient Active Problem List   Diagnosis Date Noted  . NSTEMI (non-ST elevated myocardial infarction) 10/27/2013  . Pure hypercholesterolemia 10/27/2013  . Acute coronary syndrome 10/26/2013  . Influenza A 05/19/2011  . Tobacco abuse 05/19/2011  . COPD with acute exacerbation 05/18/2011  . PUD (peptic ulcer disease) 05/18/2011   Discharge Diagnoses:   Patient Active Problem List   Diagnosis Date Noted  . S/P CABG x 4 10/31/2013  . NSTEMI (non-ST elevated myocardial infarction) 10/27/2013  . Pure hypercholesterolemia 10/27/2013  . Acute coronary syndrome 10/26/2013  . Influenza A 05/19/2011  . Tobacco abuse 05/19/2011  . COPD with acute exacerbation 05/18/2011  . PUD (peptic ulcer disease) 05/18/2011   Discharged Condition: good  History of Present Illness:   [name] is a 58 yo African American Male with known history of Hypertension, Hyperlipidemia, COPD, Tobacco use, and history of drug and alcohol use.  The patient states he has been in his usual state of health.  However Friday 10/28/2013 the patient suffered a stressful day at work.  That evening he ingested alcohol and smoked some Marijuana.  He was later awoken from sleep be chest pressure.  The pain radiated down his left arm and was associated with shortness of breath, diaphoresis, and anxious.  He called 911 and was transported to the ED via EMS.  He was administered SL NTG and ASA during transport which provided relief of her chest pain.  Workup in the ED showed inferior ST segment depression.  His initial Troponin level was negative.  However, subsequent enzymes did become positive and he was ruled in for an MI.  He was admitted by Cardiology and cardiac catheterization was performed and showed severe 3 vessel CAD and a preserved EF.  It was felt  coronary bypass grafting would be the treatment of choice and TCTS consult was obtained.    Hospital Course:   He was evaluated by Dr. Roxan Hockey who was in agreement he would benefit from Coronary Artery Bypass Grafting procedure.  The risks and benefits of the procedure were explained to the patient and he was agreeable to proceed.  The patient was started on a Heparin and remained chest pain free prior to surgery.  He was taken to the operating room on 10/31/13.  He underwent CABG x 4 utilizing LIMA to LAD, Sequential SVG to OM1 and OM2, and SVG to PDA.  He also underwent endoscopic saphenous vein harvest from the right leg.  The patient tolerated the procedure well and was taken to the SICU in stable condition.  He was extubated the evening of surgery.  During his stay in the ICU the progressed well.  His chest tubes and arterial lines were removed without difficulty.  He was maintaining NSR, however his heart rate was elevated.  His coreg dose was titrated and he was transferred to the step down unit in stable condition.    The patient continues to progress.  He is maintaining NSR and his pacing wires were removed without difficulty.  The patient lives alone and Physical therapy evaluation was performed.  It was felt he would benefit from SNF placement.  The patient is ambulating independently.  He is tolerating a heart healthy diet.  He is medically stable for discharge.  He is felt to require short term SNF as he has nobody he can currently stay  with.  Significant Diagnostic Studies: angiography:   Hemodynamic Findings:  Central aortic pressure: 120/69  Left ventricular pressure: 124/12/16  Angiographic Findings:  Left main: No obstructive disease.  Left Anterior Descending Artery: Large caliber vessel that courses to the apex. The proximal has long hazy 80% stenosis. There is mild plaque in the mid and distal vessel. Small caliber first diagonal 70% stenosis.  Circumflex Artery: Large caliber  vessel with proximal 99% stenosis. The first obtuse marginal branch is moderate in caliber with no obstructive disease. The second obtuse marginal branch is moderate in caliber with proximal 70% stenosis. The distal AV groove Circumflex has 50% stenosis.  Right Coronary Artery: Large dominant vessel with diffuse 40% mid stenosis followed by a focal 70-80% mid stenosis. The distal vessel has mild plaque disease. The PDA is a moderate caliber vessel with serial 40% stenoses.  Left Ventricular Angiogram: LVEF=55-60%  Treatments: surgery:   Median sternotomy, extracorporeal circulation, coronary artery bypass grafting x4 (left internal mammary artery to the left anterior descending coronary artery, sequential saphenous vein graft to obtuse marginals 1 and 2, saphenous vein graft to posterior descending), endoscopic vein harvest, right leg.  Disposition: 01-Home or Self Care  Discharge Medications:    Medication List         aspirin 325 MG EC tablet  Take 1 tablet (325 mg total) by mouth daily.     atorvastatin 80 MG tablet  Commonly known as:  LIPITOR  Take 1 tablet (80 mg total) by mouth daily at 6 PM.     carvedilol 12.5 MG tablet  Commonly known as:  COREG  Take 1 tablet (12.5 mg total) by mouth 2 (two) times daily with a meal.     Fluticasone-Salmeterol 250-50 MCG/DOSE Aepb  Commonly known as:  ADVAIR  Inhale 1 puff into the lungs every 12 (twelve) hours.     oxyCODONE 5 MG immediate release tablet  Commonly known as:  Oxy IR/ROXICODONE  Take 1-2 tablets (5-10 mg total) by mouth every 4 (four) hours as needed for moderate pain.     PROAIR HFA 108 (90 BASE) MCG/ACT inhaler  Generic drug:  albuterol  Inhale 1 puff into the lungs every 6 (six) hours as needed for wheezing or shortness of breath.       The patient has been discharged on:   1.Beta Blocker:  Yes [  x ]                              No   [   ]                              If No, reason:  2.Ace Inhibitor/ARB:  Yes [   ]                                     No  [    ]                                     If No, reason:low relative BP, may be able to start as outpatient  3.Statin:   Yes [ x  ]  No  [   ]                  If No, reason:  4.Ecasa:  Yes  [ x  ]                  No   [   ]   Follow-up Information   Follow up with Melrose Nakayama, MD. (June 30 at 3:00 pm to see Dr Roxan Hockey)    Specialty:  Cardiothoracic Surgery   Contact information:   [place] [address] [phone]       Follow up with [facility]. (obtain a chest xray 1 hour prior to appointment with the surgeon. [facility] is located in the same office complex as [facility] [employer] office)    Contact information:   [place]       Call Lauree Chandler, MD. (2 weeks- please contact the cardiology office to arrange this appointment)    Specialty:  Cardiology   Contact information:   [place]  [id number] [phone]       Signed: Ellwood Handler 11/01/2013, 1:41 PM

## 2013-11-01 NOTE — Progress Notes (Signed)
Pt assessed per RT protocol. Per severity score of 3, scheduled neb treatments are not indicated at this time. Neb treatments were changed to PRN per home regimen. RT will continue to monitor.

## 2013-11-01 NOTE — Discharge Instructions (Signed)
Coronary Artery Bypass Grafting, Care After °Refer to this sheet in the next few weeks. These instructions provide you with information on caring for yourself after your procedure. Your health care provider may also give you more specific instructions. Your treatment has been planned according to current medical practices, but problems sometimes occur. Call your health care provider if you have any problems or questions after your procedure. °WHAT TO EXPECT AFTER THE PROCEDURE °Recovery from surgery will be different for everyone. Some people feel well after 3 or 4 weeks, while for others it takes longer. After your procedure, it is typical to have the following: °· Nausea and a lack of appetite.   °· Constipation. °· Weakness and fatigue.   °· Depression or irritability.   °· Pain or discomfort at your incision site. °HOME CARE INSTRUCTIONS °· Only take over-the-counter or prescription medicines as directed by your health care provider. Take all medicines exactly as directed. Do not stop taking medicines or start any new medicines without first checking with your health care provider.   °· Take your pulse as directed by your health care provider. °· Perform deep breathing as directed by your health care provider. If you were given a device called an incentive spirometer, use it to practice deep breathing several times a day. Support your chest with a pillow or your arms when you take deep breaths or cough. °· Keep incision areas clean, dry, and protected. Remove or change any bandages (dressings) only as directed by your health care provider. You may have skin adhesive strips over the incision areas. Do not take the strips off. They will fall off on their own. °· Check incision areas daily for any swelling, redness, or drainage. °· If incisions were made in your legs, do the following: °· Avoid crossing your legs.   °· Avoid sitting for long periods of time. Change positions every 30 minutes.   °· Elevate your legs  when you are sitting.   °· Wear compression stockings as directed by your health care provider. These stockings help keep blood clots from forming in your legs. °· Take showers once your health care provider approves. Until then, only take sponge baths. Pat incisions dry. Do not rub incisions with a washcloth or towel. Do not take tub baths or go swimming until your health care provider approves. °· Eat foods that are high in fiber, such as raw fruits and vegetables, whole grains, beans, and nuts. Meats should be lean cut. Avoid canned, processed, and fried foods. °· Drink enough fluids to keep your urine clear or pale yellow. °· Weigh yourself every day. This helps identify if you are retaining fluid that may make your heart and lungs work harder.   °· Rest and limit activity as directed by your health care provider. You may be instructed to: °· Stop any activity at once if you have chest pain, shortness of breath, irregular heartbeats, or dizziness. Get help right away if you have any of these symptoms. °· Move around frequently for short periods or take short walks as directed by your health care provider. Increase your activities gradually. You may need physical therapy or cardiac rehabilitation to help strengthen your muscles and build your endurance. °· Avoid lifting, pushing, or pulling anything heavier than 10 lb (4.5 kg) for at least 6 weeks after surgery. °· Do not drive until your health care provider approves.  °· Ask your health care provider when you may return to work and resume sexual activity. °· Follow up with your health care provider as   directed.   °SEEK MEDICAL CARE IF: °· You have swelling, redness, increasing pain, or drainage at the site of an incision.   °· You develop a fever.   °· You have swelling in your ankles or legs.   °· You have pain in your legs.   °· You have weight gain of 2 or more pounds a day. °· You are nauseous or vomit. °· You have diarrhea.  °SEEK IMMEDIATE MEDICAL CARE  IF: °· You have chest pain that goes to your jaw or arms. °· You have shortness of breath.   °· You have a fast or irregular heartbeat.   °· You notice a "clicking" in your breastbone (sternum) when you move.   °· You have numbness or weakness in your arms or legs. °· You feel dizzy or lightheaded.   °MAKE SURE YOU: °· Understand these instructions. °· Will watch your condition. °· Will get help right away if you are not doing well or get worse. °Document Released: 12/17/2004 Document Revised: 01/30/2013 Document Reviewed: 11/06/2012 °[employer]® Patient Information ©2014 [employer]. ° °Endoscopic Saphenous Vein Harvesting °Care After °Refer to this sheet in the next few weeks. These instructions provide you with information on caring for yourself after your procedure. Your caregiver may also give you more specific instructions. Your treatment has been planned according to current medical practices, but problems sometimes occur. Call your caregiver if you have any problems or questions after your procedure. °HOME CARE INSTRUCTIONS °Medicine °· Take whatever pain medicine your surgeon prescribes. Follow the directions carefully. Do not take over-the-counter pain medicine unless your surgeon says it is okay. Some pain medicine can cause bleeding problems for several weeks after surgery. °· Follow your surgeon's instructions about driving. You will probably not be permitted to drive after heart surgery. °· Take any medicines your surgeon prescribes. Any medicines you took before your heart surgery should be checked with your caregiver before you start taking them again. °Wound care °· Ask your surgeon how long you should keep wearing your elastic bandage or stocking. °· Check the area around your surgical cuts (incisions) whenever your bandages (dressings) are changed. Look for any redness or swelling. °· You will need to return to have the stitches (sutures) or staples taken out. Ask your surgeon when to do  that. °· Ask your surgeon when you can shower or bathe. °Activity °· Try to keep your legs raised when you are sitting. °· Do any exercises your caregivers have given you. These may include deep breathing exercises, coughing, walking, or other exercises. °SEEK MEDICAL CARE IF: °· You have any questions about your medicines. °· You have more leg pain, especially if your pain medicine stops working. °· New or growing bruises develop on your leg. °· Your leg swells, feels tight, or becomes red. °· You have numbness in your leg. °SEEK IMMEDIATE MEDICAL CARE IF: °· Your pain gets much worse. °· Blood or fluid leaks from any of the incisions. °· Your incisions become warm, swollen, or red. °· You have chest pain. °· You have trouble breathing. °· You have a fever. °· You have more pain near your leg incision. °MAKE SURE YOU: °· Understand these instructions. °· Will watch your condition. °· Will get help right away if you are not doing well or get worse. °Document Released: 02/09/2011 Document Revised: 08/22/2011 Document Reviewed: 02/09/2011 °[employer]® Patient Information ©2014 [employer]. ° ° °

## 2013-11-01 NOTE — Progress Notes (Addendum)
[address]       [place] [address]             [phone]      1 Day Post-Op Procedure(s) (LRB): CORONARY ARTERY BYPASS GRAFTING TIMES FOUR ON PUMP USING LEFT INTERNAL MAMMARY ARTERY AND RIGHT GREATER SAPHENOUS VEIN VIA ENDOVEIN HARVEST. (N/A) INTRAOPERATIVE TRANSESOPHAGEAL ECHOCARDIOGRAM (N/A)  Subjective:  Mr. [name] has no complaints this morning.  He states he is doing okay.  His pain is well controlled.  Objective: Vital signs in last 24 hours: Temp:  [95.7 F (35.4 C)-100 F (37.8 C)] 99.9 F (37.7 C) (05/22 0700) Pulse Rate:  [62-105] 102 (05/22 0700) Cardiac Rhythm:  [-] Normal sinus rhythm (05/22 0600) Resp:  [12-24] 21 (05/22 0700) BP: (96-129)/(57-87) 116/61 mmHg (05/22 0700) SpO2:  [97 %-100 %] 98 % (05/22 0700) Arterial Line BP: (88-167)/(45-88) 123/49 mmHg (05/22 0700) FiO2 (%):  [40 %-50 %] 40 % (05/21 1745) Weight:  [212 lb 11.9 oz (96.5 kg)] 212 lb 11.9 oz (96.5 kg) (05/22 0500)  Hemodynamic parameters for last 24 hours: PAP: (16-48)/(7-25) 48/21 mmHg CO:  [3.8 L/min-6.9 L/min] 6.9 L/min CI:  [1.8 L/min/m2-3.3 L/min/m2] 3.3 L/min/m2  Intake/Output from previous day: 05/21 0701 - 05/22 0700 In: 5192.4 [P.O.:100; I.V.:3232.4; Blood:360; IV Piggyback:1500] Out: 5809 [Urine:3390; Blood:600; Chest Tube:500]  General appearance: alert, cooperative and no distress Heart: regular rate and rhythm Lungs: clear to auscultation bilaterally Abdomen: soft, non-tender; bowel sounds normal; no masses,  no organomegaly Extremities: edema trace Wound: clean and dry  Lab Results:  Recent Labs  10/31/13 2000 11/01/13 0445  WBC 10.8* 9.1  HGB 11.0* 10.7*  HCT 32.0* 31.5*  PLT 190 178   BMET:  Recent Labs  10/31/13 0633  10/31/13 1928 10/31/13 2000 11/01/13 0445  NA 141  < > 139  --  135*  K 4.3  < > 4.5  --  4.4  CL 106  --  107  --  103  CO2 23  --   --   --  22  GLUCOSE 101*  < > 121*  --  126*  BUN 12  --  9  --  11    CREATININE 1.10  --  1.10 1.08 1.03  CALCIUM 8.8  --   --   --  8.2*  < > = values in this interval not displayed.  PT/INR:  Recent Labs  10/31/13 1300  LABPROT 15.4*  INR 1.25   ABG    Component Value Date/Time   PHART 7.324* 10/31/2013 1930   HCO3 22.5 10/31/2013 1930   TCO2 24 10/31/2013 1930   ACIDBASEDEF 3.0* 10/31/2013 1930   O2SAT 97.0 10/31/2013 1930   CBG (last 3)   Recent Labs  10/31/13 2006 11/01/13 0018 11/01/13 0416  GLUCAP 98 119* 122*    Assessment/Plan: S/P Procedure(s) (LRB): CORONARY ARTERY BYPASS GRAFTING TIMES FOUR ON PUMP USING LEFT INTERNAL MAMMARY ARTERY AND RIGHT GREATER SAPHENOUS VEIN VIA ENDOVEIN HARVEST. (N/A) INTRAOPERATIVE TRANSESOPHAGEAL ECHOCARDIOGRAM (N/A)  1. CV- off all drips, mild tachycardia, good pressure- continue Lopressor 2. Pulm- wean oxygen as tolerated, CXR with atelectasis, no significant effusions, encouraged IS 3. Renal- creatinine minimally elevated at 1.03, patient is volume overloaded, will give a dose of Lasix today 4. Expected Acute Blood Loss Anemia- Hgb stable at 10.7 5. ID- mildly febrile, SIRS 6. CBGs- controlled, not on insulin drip, will continue Sliding scale 7. Dispo- patient doing well,  POD #1 progression orders, possibly  transfer to step down later this evening   LOS: 6 days    Ellwood Handler 11/01/2013  Patient seen and examined, agree with above Dc tubes, lines, mobilize Will change beta blocker to coreg History of recreational polysubstance abuse- no signs of withdrawl

## 2013-11-01 NOTE — Progress Notes (Signed)
[facility] BRIEF SICU PROGRESS NOTE  1 Day Post-Op  S/P Procedure(s) (LRB): CORONARY ARTERY BYPASS GRAFTING TIMES FOUR ON PUMP USING LEFT INTERNAL MAMMARY ARTERY AND RIGHT GREATER SAPHENOUS VEIN VIA ENDOVEIN HARVEST. (N/A) INTRAOPERATIVE TRANSESOPHAGEAL ECHOCARDIOGRAM (N/A)   Stable day Just ambulated around SICU NSR w/ stable BP O2 sats 94-96% on RA Diuresing fairly well  Plan: Continue current plan  Rexene Alberts 11/01/2013 8:24 PM

## 2013-11-01 NOTE — Op Note (Signed)
[name], [name]              ACCOUNT NO.:  [id number]  MEDICAL RECORD NO.:  [id number]  LOCATION:  2S14C                        FACILITY:  [facility]  PHYSICIAN:  Revonda Standard. Roxan Hockey, M.D.DATE OF BIRTH:  May 02, 1956  DATE OF PROCEDURE:  10/31/2013 DATE OF DISCHARGE:                              OPERATIVE REPORT   PREOPERATIVE DIAGNOSIS:  Severe three-vessel coronary artery disease with non-ST elevation myocardial infarction.  POSTOPERATIVE DIAGNOSIS:  Severe three-vessel coronary artery disease with non-ST elevation myocardial infarction.  PROCEDURE:  Median sternotomy, extracorporeal circulation, coronary artery bypass grafting x4 (left internal mammary artery to the left anterior descending coronary artery, sequential saphenous vein graft to obtuse marginals 1 and 2, saphenous vein graft to posterior descending), endoscopic vein harvest, right leg.  SURGEON:  Revonda Standard. Roxan Hockey, MD  FIRST ASSISTANCE:  Johnny Giovanni, PA-C  ANESTHESIA:  General.  FINDINGS:  Severe hyperinflation of the lungs made mammary takedown, initial and closing phases of procedure difficult.  LAD and OM-2 good quality targets, OM-1 fair quality, posterior descending fair quality, but better than expected based on catheterization films.  Vein and mammary good quality.  CLINICAL NOTE:  Mr. [name] is a 58 year old gentleman with multiple cardiac risk factors and a history of multiple substance abuse.  He presented following an episode of unstable chest pressure.  He ruled in for a non-Q-wave MI.  At catheterization, he had severe three-vessel disease.  He was referred for coronary artery bypass grafting.  The indications, risks, benefits, and alternatives were discussed in detail with the patient.  He understood and accepted the risks and agreed to proceed.  OPERATIVE NOTE:  Mr. [name] was brought to the preoperative holding area on Oct 31, 2013. There Anesthesia placed a Swan-Ganz catheter  and an arterial blood pressure monitoring line.  He was taken to the operating room, anesthetized, and intubated.  Intravenous antibiotics were administered.  Transesophageal echocardiography was performed.  It revealed left ventricular hypertrophy.  There was preserved left ventricular systolic function.  There was impaired diastolic relaxation. There was mild mitral regurgitation.   A Foley catheter was placed.  The chest, abdomen, and legs were prepped and draped in the usual sterile fashion.  A median sternotomy was performed.  The left internal mammary artery was harvested in standard fashion.  This was difficult due to severe emphysema and hyperinflation of the lungs.  Simultaneously with this, an incision was made in the medial aspect of the right leg at the level of the knee and the greater saphenous vein was harvested from the right leg endoscopically.  2000 units of heparin was administered during the vessel harvest.  Remainder of the full heparin dose was given prior to opening the pericardium.  After harvesting the conduits, the pericardium was opened.  The ascending aorta was inspected.  It was of normal caliber with no atherosclerotic disease.  The aorta was cannulated via concentric 2-0 Ethibond pledgeted pursestring sutures.  A dual-stage venous cannula was placed via a pursestring suture in the right atrial appendage.  After confirming adequate anticoagulation with ACT measurement, cardiopulmonary bypass was instituted.  Flows were maintained per protocol.  The patient was cooled to 32 degrees Celsius.  The coronary arteries  were inspected and anastomotic sites were chosen. The conduits were inspected and cut to length.  A foam pad was placed in the pericardium to insulate the heart and protect the left phrenic nerve.  A temperature probe was placed in myocardial septum and a cardioplegic cannula was placed in the ascending aorta.  The aorta was crossclamped.  The  left ventricle was emptied via the aortic root vent.  Cardiac arrest then was achieved with a combination of cold antegrade blood cardioplegia and topical iced saline, 1 L of cardioplegia was administered.  There was myocardial septal cooling to 10 degrees Celsius.  There was a rapid diastolic arrest.  The following distal anastomoses were performed.  First, a reversed saphenous vein graft was placed end-to-side to the posterior descending branch of the right coronary.  This vessel was a 1.5- mm vessel.  It was diffusely diseased, but a 1.5-mm probe did pass distally almost to the apex as well as proximally back into the distal right coronary artery.  This vessel was of better quality than it appeared on catheterization.  The vein was of good quality.  It was anastomosed end-to-side with running 7-0 Prolene suture.  There was good flow and good hemostasis through the graft with cardioplegia administration.  Next, a reversed saphenous vein graft was placed sequentially to obtuse marginals 1 and 2.  OM-1 was a 1.5-mm fair quality target, OM-2 was a 1.8-mm good quality target.  A side-to-side anastomosis was performed to OM-1 and end-to-side to OM-2, both were done with running 7-0 Prolene sutures.  Both anastomoses were probed proximally and distally at their completion.  The probe passed easily at both anastomoses.  Cardioplegia was administered.  There was good flow and good hemostasis.  Additional cardioplegia was administered down the aortic root.  The left internal mammary artery then was brought through a window in the pericardium.  The distal end was beveled and was anastomosed end-to-side to the distal LAD.  The LAD was a 1.8-mm good quality target vessel. The mammary was a good quality conduit.  An end-to-side anastomosis was performed with a running 8-0 Prolene suture.  At completion of the anastomosis, the bulldog clamp was briefly removed and rapid septal rewarming was  noted.  The bulldog clamp was replaced.  The mammary pedicle was tacked to the epicardial surface of the heart with 6- 0 Prolene sutures.  Additional cardioplegia was administered.  The vein grafts were cut to length.  The cardioplegia cannula was removed from the ascending aorta. The proximal vein graft anastomoses were performed to 4.5-mm punch aortotomies with running 6-0 Prolene sutures.  At the completion of the final proximal anastomosis, the patient was placed in Trendelenburg position.  Lidocaine was administered.  The bulldog clamp was removed from the left mammary artery.  The aortic root was de-aired and the aortic crossclamp was removed.  Total crossclamp time was 78 minutes.  The patient spontaneously resumed sinus rhythm and did not require defibrillation.  While rewarming was completed, all proximal and distal anastomoses were inspected for hemostasis.  Epicardial pacing wires were placed on the right ventricle and right atrium and DDD pacing was initiated for bradycardia.  When the patient had reached a core temperature of 37 degrees Celsius, he was weaned from cardiopulmonary bypass on the first attempt.  He was DDD paced and did not require inotropic support.  The total bypass time was 109 minutes.  The initial cardiac index was greater than 2 L/minute/m2, and he remained hemodynamically stable throughout  the post bypass period.  A test dose protamine was administered and was well tolerated.  The atrial and aortic cannulae were removed.  The remainder of the protamine was administered without incident.  Chest was irrigated with warm saline.  Hemostasis was achieved.  Left pleural and mediastinal chest tubes were placed through separate subcostal incisions.  The sternum was closed with a combination of single and double heavy gauge stainless steel wires.  The pectoralis fascia, subcutaneous tissue, and skin were closed in standard fashion.  All sponge, needle, and  instrument counts were correct at the end of the procedure.  The patient was taken from the operating room to the surgical intensive care unit in good condition.     Revonda Standard Roxan Hockey, M.D.     SCH/MEDQ  D:  10/31/2013  T:  11/01/2013  Job:  [id number]

## 2013-11-02 ENCOUNTER — Inpatient Hospital Stay (HOSPITAL_COMMUNITY): Payer: No Typology Code available for payment source

## 2013-11-02 LAB — CBC
HCT: 28.6 % — ABNORMAL LOW (ref 39.0–52.0)
HEMOGLOBIN: 10.1 g/dL — AB (ref 13.0–17.0)
MCH: 30.5 pg (ref 26.0–34.0)
MCHC: 35.3 g/dL (ref 30.0–36.0)
MCV: 86.4 fL (ref 78.0–100.0)
Platelets: 159 10*3/uL (ref 150–400)
RBC: 3.31 MIL/uL — AB (ref 4.22–5.81)
RDW: 14.8 % (ref 11.5–15.5)
WBC: 11.7 10*3/uL — ABNORMAL HIGH (ref 4.0–10.5)

## 2013-11-02 LAB — GLUCOSE, CAPILLARY
Glucose-Capillary: 115 mg/dL — ABNORMAL HIGH (ref 70–99)
Glucose-Capillary: 101 mg/dL — ABNORMAL HIGH (ref 70–99)
Glucose-Capillary: 125 mg/dL — ABNORMAL HIGH (ref 70–99)

## 2013-11-02 LAB — BASIC METABOLIC PANEL
BUN: 11 mg/dL (ref 6–23)
CHLORIDE: 101 meq/L (ref 96–112)
CO2: 26 mEq/L (ref 19–32)
Calcium: 8.4 mg/dL (ref 8.4–10.5)
Creatinine, Ser: 0.98 mg/dL (ref 0.50–1.35)
GFR calc Af Amer: >90 mL/min (ref >90)
GFR, EST NON AFRICAN AMERICAN: 88 mL/min — AB (ref >90)
Glucose, Bld: 117 mg/dL — ABNORMAL HIGH (ref 70–99)
POTASSIUM: 3.6 meq/L — AB (ref 3.7–5.3)
SODIUM: 136 meq/L — AB (ref 137–147)

## 2013-11-02 MED ORDER — POTASSIUM CHLORIDE 10 MEQ/50ML IV SOLN
10.0000 meq | INTRAVENOUS | Status: AC
  Administered 2013-11-02 (×3): 10 meq via INTRAVENOUS
  Filled 2013-11-02 (×3): qty 50

## 2013-11-02 MED ORDER — POTASSIUM CHLORIDE CRYS ER 20 MEQ PO TBCR
20.0000 meq | EXTENDED_RELEASE_TABLET | Freq: Every day | ORAL | Status: DC
  Administered 2013-11-02 – 2013-11-05 (×4): 20 meq via ORAL
  Filled 2013-11-02 (×6): qty 1

## 2013-11-02 MED ORDER — SODIUM CHLORIDE 0.9 % IV SOLN: 250.0000 mL | INTRAVENOUS | Status: DC | PRN

## 2013-11-02 MED ORDER — FUROSEMIDE 40 MG PO TABS
40.0000 mg | ORAL_TABLET | Freq: Every day | ORAL | Status: DC
  Administered 2013-11-02 – 2013-11-05 (×4): 40 mg via ORAL
  Filled 2013-11-02 (×4): qty 1

## 2013-11-02 MED ORDER — MOVING RIGHT ALONG BOOK
Freq: Once | Status: AC
  Administered 2013-11-02: 11:00:00
  Filled 2013-11-02: qty 1

## 2013-11-02 MED ORDER — CARVEDILOL 12.5 MG PO TABS
12.5000 mg | ORAL_TABLET | Freq: Two times a day (BID) | ORAL | Status: DC
  Administered 2013-11-02 – 2013-11-05 (×5): 12.5 mg via ORAL
  Filled 2013-11-02 (×8): qty 1

## 2013-11-02 MED ORDER — SODIUM CHLORIDE 0.9 % IJ SOLN: 3.0000 mL | INTRAMUSCULAR | Status: DC | PRN

## 2013-11-02 MED ORDER — MORPHINE SULFATE 2 MG/ML IJ SOLN: 2.0000 mg | INTRAMUSCULAR | Status: DC | PRN

## 2013-11-02 MED ORDER — TRAMADOL HCL 50 MG PO TABS: 50.0000 mg | ORAL_TABLET | ORAL | Status: DC | PRN

## 2013-11-02 MED ORDER — SODIUM CHLORIDE 0.9 % IJ SOLN
3.0000 mL | Freq: Two times a day (BID) | INTRAMUSCULAR | Status: DC
  Administered 2013-11-02 – 2013-11-04 (×4): 3 mL via INTRAVENOUS

## 2013-11-02 NOTE — Progress Notes (Signed)
Pt transferred to 2W-15 via ambulation, meds in chart, belongings with pt. VS stable at time of transfer. Family aware of room change. Bedside handoff to Raechel Chute RN. No questions or complaints at this time.  Thelma Comp

## 2013-11-02 NOTE — Progress Notes (Addendum)
      [address]       [place] [address]             [phone]        CARDIOTHORACIC SURGERY PROGRESS NOTE   R2 Days Post-Op Procedure(s) (LRB): CORONARY ARTERY BYPASS GRAFTING TIMES FOUR ON PUMP USING LEFT INTERNAL MAMMARY ARTERY AND RIGHT GREATER SAPHENOUS VEIN VIA ENDOVEIN HARVEST. (N/A) INTRAOPERATIVE TRANSESOPHAGEAL ECHOCARDIOGRAM (N/A)  Subjective: Feels sore in chest but o/w okay.  Objective: Vital signs: BP Readings from Last 1 Encounters:  11/02/13 112/64   Pulse Readings from Last 1 Encounters:  11/02/13 98   Resp Readings from Last 1 Encounters:  11/02/13 18   Temp Readings from Last 1 Encounters:  11/02/13 98.2 F (36.8 C) Oral    Hemodynamics:    Physical Exam:  Rhythm:   sinus  Breath sounds: clear  Heart sounds:  RRR  Incisions:  Clean and dry  Abdomen:  Soft, non-distended, non-tender  Extremities:  Warm, well-perfused    Intake/Output from previous day: 05/22 0701 - 05/23 0700 In: 670 [I.V.:520; IV Piggyback:150] Out: 2995 [Urine:2995] Intake/Output this shift: Total I/O In: 90 [I.V.:40; IV Piggyback:50] Out: 150 [Urine:150]  Lab Results:  CBC: Recent Labs  11/01/13 1650 11/01/13 1655 11/02/13 0348  WBC 11.0*  --  11.7*  HGB 10.9* 11.9* 10.1*  HCT 31.4* 35.0* 28.6*  PLT 165  --  159    BMET:  Recent Labs  11/01/13 0445 11/01/13 1655 11/02/13 0348  NA 135* 134* 136*  K 4.4 3.9 3.6*  CL 103 97 101  CO2 22  --  26  GLUCOSE 126* 135* 117*  BUN 11 9 11   CREATININE 1.03 1.20 0.98  CALCIUM 8.2*  --  8.4     CBG (last 3)   Recent Labs  11/02/13 0104 11/02/13 0359 11/02/13 0732  GLUCAP 115* 101* 125*    ABG    Component Value Date/Time   PHART 7.324* 10/31/2013 1930   PCO2ART 43.4 10/31/2013 1930   PO2ART 103.0* 10/31/2013 1930   HCO3 22.5 10/31/2013 1930   TCO2 24 11/01/2013 1655   ACIDBASEDEF 3.0* 10/31/2013 1930   O2SAT 97.0 10/31/2013 1930    CXR: PORTABLE CHEST - 1 VIEW  COMPARISON:  11/01/2013  FINDINGS:  Heart size appears within normal limits and is stable. Pulmonary  vascularity normal. There are changes of median sternotomy for CABG.  There is mild left hilar and bibasilar atelectasis. Negative for  pulmonary edema. No visible pneumothorax. Right IJ sheath terminates  in the proximal superior vena cava.  IMPRESSION:  Scattered areas of atelectasis.  Electronically Signed  By: Curlene Dolphin M.D.  On: 11/02/2013 09:43   Assessment/Plan: S/P Procedure(s) (LRB): CORONARY ARTERY BYPASS GRAFTING TIMES FOUR ON PUMP USING LEFT INTERNAL MAMMARY ARTERY AND RIGHT GREATER SAPHENOUS VEIN VIA ENDOVEIN HARVEST. (N/A) INTRAOPERATIVE TRANSESOPHAGEAL ECHOCARDIOGRAM (N/A)  Overall doing well POD2 Maintaining NSR w/ stable BP Breathing comfortably on room air Expected post op acute blood loss anemia, stable Expected post op volume excess, mild, diuresing Expected post op atelectasis, mild   Mobilize  Increase carvedilol  Routine care  Transfer floor   [name] 11/02/2013 10:57 AM

## 2013-11-02 NOTE — Progress Notes (Signed)
Report called to Ryan, 2W-RN.

## 2013-11-02 NOTE — Evaluation (Signed)
Physical Therapy Evaluation Patient Details Name: JAICE DIGIOIA MRN: [id number] DOB: 08-Nov-1955 Today's Date: 11/02/2013   History of Present Illness  Pt adm with NSTEMI and underwent CABG x 4 on 10/31/13. History of hypertension, hyperlipidemia, tobacco abuse, cocaine abuse and COPD.  Clinical Impression  Pt admitted with above. Pt currently with functional limitations due to the deficits listed below (see PT Problem List).  Pt will benefit from skilled PT to increase their independence and safety with mobility to allow discharge to the venue listed below. Currently activity tolerance would make home alone difficult.      Follow Up Recommendations SNF    Equipment Recommendations  Other (comment) (To be determined)    Recommendations for Other Services       Precautions / Restrictions Precautions Precautions: Fall;Sternal      Mobility  Bed Mobility                  Transfers Overall transfer level: Needs assistance Equipment used: Pushed w/c Transfers: Sit to/from Stand Sit to Stand: Min assist         General transfer comment: Verbal cues to place hands on knees to come to stand following sternal precautions. Assist to bring hips and trunk up.  Ambulation/Gait Ambulation/Gait assistance: Min assist Ambulation Distance (Feet): 300 Feet Assistive device:  (pushing w/c) Gait Pattern/deviations: Step-through pattern;Decreased step length - right;Decreased step length - left Gait velocity: decr Gait velocity interpretation: Below normal speed for age/gender General Gait Details: Verbal cues to stand more erect  Stairs            Wheelchair Mobility    Modified Rankin (Stroke Patients Only)       Balance Overall balance assessment: Needs assistance         Standing balance support: No upper extremity supported Standing balance-Leahy Scale: Fair                               Pertinent Vitals/Pain SaO2 >97% on RA. HR 100's     Home Living Family/patient expects to be discharged to:: Private residence Living Arrangements: Alone   Type of Home: House Home Access: Stairs to enter Entrance Stairs-Rails: Right Entrance Stairs-Number of Steps: 3 Home Layout: One level Home Equipment: None      Prior Function Level of Independence: Independent               Hand Dominance        Extremity/Trunk Assessment   Upper Extremity Assessment: Generalized weakness           Lower Extremity Assessment: Generalized weakness         Communication   Communication: No difficulties  Cognition Arousal/Alertness: Awake/alert Behavior During Therapy: WFL for tasks assessed/performed Overall Cognitive Status: Within Functional Limits for tasks assessed                      General Comments      Exercises        Assessment/Plan    PT Assessment Patient needs continued PT services  PT Diagnosis Difficulty walking;Generalized weakness   PT Problem List Decreased strength;Decreased activity tolerance;Decreased balance;Decreased mobility;Decreased knowledge of precautions  PT Treatment Interventions DME instruction;Gait training;Stair training;Functional mobility training;Therapeutic exercise;Therapeutic activities;Balance training;Patient/family education   PT Goals (Current goals can be found in the Care Plan section) Acute Rehab PT Goals Patient Stated Goal: Go to rehab and then home PT Goal Formulation:  With patient Time For Goal Achievement: 11/09/13 Potential to Achieve Goals: Good    Frequency Min 3X/week   Barriers to discharge Decreased caregiver support      Co-evaluation               End of Session   Activity Tolerance: Patient tolerated treatment well Patient left: in chair;with call bell/phone within reach Nurse Communication: Mobility status         Time: 1607-3710 PT Time Calculation (min): 16 min   Charges:   PT Evaluation $Initial PT Evaluation  Tier I: 1 Procedure PT Treatments $Gait Training: 8-22 mins   PT G CodesShary Decamp Verl Kitson 11/02/2013, 9:01 AM  Suanne Marker PT [phone]

## 2013-11-03 ENCOUNTER — Inpatient Hospital Stay (HOSPITAL_COMMUNITY): Payer: No Typology Code available for payment source

## 2013-11-03 DIAGNOSIS — Z951 Presence of aortocoronary bypass graft: Secondary | ICD-10-CM

## 2013-11-03 DIAGNOSIS — R9431 Abnormal electrocardiogram [ECG] [EKG]: Secondary | ICD-10-CM

## 2013-11-03 LAB — BASIC METABOLIC PANEL
BUN: 11 mg/dL (ref 6–23)
CALCIUM: 8.2 mg/dL — AB (ref 8.4–10.5)
CO2: 24 mEq/L (ref 19–32)
CREATININE: 1 mg/dL (ref 0.50–1.35)
Chloride: 103 mEq/L (ref 96–112)
GFR, EST NON AFRICAN AMERICAN: 80 mL/min — AB (ref >90)
GLUCOSE: 98 mg/dL (ref 70–99)
Potassium: 3.6 mEq/L — ABNORMAL LOW (ref 3.7–5.3)
Sodium: 139 mEq/L (ref 137–147)

## 2013-11-03 LAB — CBC
HEMATOCRIT: 25.9 % — AB (ref 39.0–52.0)
HEMOGLOBIN: 9.1 g/dL — AB (ref 13.0–17.0)
MCH: 30.1 pg (ref 26.0–34.0)
MCHC: 35.1 g/dL (ref 30.0–36.0)
MCV: 85.8 fL (ref 78.0–100.0)
Platelets: 166 10*3/uL (ref 150–400)
RBC: 3.02 MIL/uL — ABNORMAL LOW (ref 4.22–5.81)
RDW: 14.9 % (ref 11.5–15.5)
WBC: 11.5 10*3/uL — ABNORMAL HIGH (ref 4.0–10.5)

## 2013-11-03 NOTE — Progress Notes (Addendum)
      [address]       [place] [address]             [phone]      3 Days Post-Op Procedure(s) (LRB): CORONARY ARTERY BYPASS GRAFTING TIMES FOUR ON PUMP USING LEFT INTERNAL MAMMARY ARTERY AND RIGHT GREATER SAPHENOUS VEIN VIA ENDOVEIN HARVEST. (N/A) INTRAOPERATIVE TRANSESOPHAGEAL ECHOCARDIOGRAM (N/A)  Subjective:  [name] has no complaints this morning.  He did have a restless night last night, due to pain.   However, he was unwilling to use pain medication.  He eventually took an OXY IR which provided relief.  Objective: Vital signs in last 24 hours: Temp:  [98.5 F (36.9 C)-99.9 F (37.7 C)] 99.4 F (37.4 C) (05/24 0530) Pulse Rate:  [93-103] 93 (05/24 0530) Cardiac Rhythm:  [-] Normal sinus rhythm;Sinus tachycardia (05/23 2120) Resp:  [18-21] 18 (05/24 0530) BP: (94-112)/(58-66) 97/62 mmHg (05/24 0530) SpO2:  [94 %-99 %] 95 % (05/24 0810) Weight:  [200 lb 13.4 oz (91.1 kg)] 200 lb 13.4 oz (91.1 kg) (05/24 0530)  Intake/Output from previous day: 05/23 0701 - 05/24 0700 In: 1160 [P.O.:960; I.V.:100; IV Piggyback:100] Out: 225 [Urine:225]  General appearance: alert, cooperative and no distress Heart: regular rate and rhythm Lungs: clear to auscultation bilaterally Abdomen: soft, non-tender; bowel sounds normal; no masses,  no organomegaly Extremities: edema trace Wound: clean and dry  Lab Results:  Recent Labs  11/02/13 0348 11/03/13 0550  WBC 11.7* 11.5*  HGB 10.1* 9.1*  HCT 28.6* 25.9*  PLT 159 166   BMET:  Recent Labs  11/02/13 0348 11/03/13 0550  NA 136* 139  K 3.6* 3.6*  CL 101 103  CO2 26 24  GLUCOSE 117* 98  BUN 11 11  CREATININE 0.98 1.00  CALCIUM 8.4 8.2*    PT/INR:  Recent Labs  10/31/13 1300  LABPROT 15.4*  INR 1.25   ABG    Component Value Date/Time   PHART 7.324* 10/31/2013 1930   HCO3 22.5 10/31/2013 1930   TCO2 24 11/01/2013 1655   ACIDBASEDEF 3.0* 10/31/2013 1930   O2SAT 97.0 10/31/2013 1930   CBG  (last 3)   Recent Labs  11/02/13 0104 11/02/13 0359 11/02/13 0732  GLUCAP 115* 101* 125*    Assessment/Plan: S/P Procedure(s) (LRB): CORONARY ARTERY BYPASS GRAFTING TIMES FOUR ON PUMP USING LEFT INTERNAL MAMMARY ARTERY AND RIGHT GREATER SAPHENOUS VEIN VIA ENDOVEIN HARVEST. (N/A) INTRAOPERATIVE TRANSESOPHAGEAL ECHOCARDIOGRAM (N/A)  1. CV- NSR rate and pressure controlled- continue Coreg 2. Pulm- off oxygen, encouraged use of IS, CXR looks great no effusions/atelectasis present 3. Renal- creatinine WNL, weight is improving on lasix 4. Deconditioning- patient lives alone, PT recs SNF, Social work consult placed 5. Dispo- patient doing well, will d/c EPW, social work consult placed, patient ready for d/c once SNF bed available   LOS: 8 days    Ellwood Handler 11/03/2013  I have seen and examined the patient and agree with the assessment and plan as outlined.  Rexene Alberts 11/03/2013 10:00 AM

## 2013-11-03 NOTE — Progress Notes (Signed)
EPW removed per MD orders and protocol; pt on bedrest for one hour; VS Q15 for an hour; call bell within reach; will continue to monitor.  Carollee Sires, RN

## 2013-11-03 NOTE — Progress Notes (Signed)
Pt has had a very restless night. Pt chest area uncomfortable and pt having some productive secretions (white). Pt declined pain meds early in the evening. Heating pad given with tylenol at midnight for comfort. Pain 5/10. Pt still did not rest. 0400- pt given bath and agreed to 10 mg Oxi-IR, in hopes to get some pain relief and rest. 0510-RN informed radiology that pt had just gotten to sleep and will call for xray of chest when he awakes.

## 2013-11-03 NOTE — Progress Notes (Signed)
Subjective:  No CP/SOB, POD4 CABG X 4  Objective:  Temp:  [98.5 F (36.9 C)-99.9 F (37.7 C)] 99.4 F (37.4 C) (05/24 0530) Pulse Rate:  [93-103] 93 (05/24 0530) Resp:  [18-21] 18 (05/24 0530) BP: (94-112)/(58-66) 97/62 mmHg (05/24 0530) SpO2:  [94 %-99 %] 95 % (05/24 0810) Weight:  [200 lb 13.4 oz (91.1 kg)] 200 lb 13.4 oz (91.1 kg) (05/24 0530) Weight change: -2 lb 3.3 oz (-1 kg)  Intake/Output from previous day: 05/23 0701 - 05/24 0700 In: 1160 [P.O.:960; I.V.:100; IV Piggyback:100] Out: 225 [Urine:225]  Intake/Output from this shift:    Physical Exam: General appearance: alert and no distress Neck: no adenopathy, no carotid bruit, no JVD, supple, symmetrical, trachea midline and thyroid not enlarged, symmetric, no tenderness/mass/nodules Lungs: clear to auscultation bilaterally Heart: regular rate and rhythm, S1, S2 normal, no murmur, click, rub or gallop Extremities: extremities normal, atraumatic, no cyanosis or edema  Lab Results: Results for orders placed during the hospital encounter of 10/26/13 (from the past 48 hour(s))  GLUCOSE, CAPILLARY     Status: Abnormal   Collection Time    11/01/13  8:54 AM      Result Value Ref Range   Glucose-Capillary 111 (*) 70 - 99 mg/dL  GLUCOSE, CAPILLARY     Status: Abnormal   Collection Time    11/01/13  1:22 PM      Result Value Ref Range   Glucose-Capillary 124 (*) 70 - 99 mg/dL   Comment 1 Notify RN    GLUCOSE, CAPILLARY     Status: Abnormal   Collection Time    11/01/13  3:30 PM      Result Value Ref Range   Glucose-Capillary 127 (*) 70 - 99 mg/dL   Comment 1 Notify RN    MAGNESIUM     Status: None   Collection Time    11/01/13  4:50 PM      Result Value Ref Range   Magnesium 2.2  1.5 - 2.5 mg/dL  CBC     Status: Abnormal   Collection Time    11/01/13  4:50 PM      Result Value Ref Range   WBC 11.0 (*) 4.0 - 10.5 K/uL   RBC 3.60 (*) 4.22 - 5.81 MIL/uL   Hemoglobin 10.9 (*) 13.0 - 17.0 g/dL   HCT  31.4 (*) 39.0 - 52.0 %   MCV 87.2  78.0 - 100.0 fL   MCH 30.3  26.0 - 34.0 pg   MCHC 34.7  30.0 - 36.0 g/dL   RDW 15.3  11.5 - 15.5 %   Platelets 165  150 - 400 K/uL  POCT I-STAT, CHEM 8     Status: Abnormal   Collection Time    11/01/13  4:55 PM      Result Value Ref Range   Sodium 134 (*) 137 - 147 mEq/L   Potassium 3.9  3.7 - 5.3 mEq/L   Chloride 97  96 - 112 mEq/L   BUN 9  6 - 23 mg/dL   Creatinine, Ser 1.20  0.50 - 1.35 mg/dL   Glucose, Bld 135 (*) 70 - 99 mg/dL   Calcium, Ion 1.23  1.12 - 1.23 mmol/L   TCO2 24  0 - 100 mmol/L   Hemoglobin 11.9 (*) 13.0 - 17.0 g/dL   HCT 35.0 (*) 39.0 - 52.0 %  GLUCOSE, CAPILLARY     Status: Abnormal   Collection Time    11/01/13  8:21  PM      Result Value Ref Range   Glucose-Capillary 152 (*) 70 - 99 mg/dL   Comment 1 Documented in Chart     Comment 2 Notify RN    GLUCOSE, CAPILLARY     Status: Abnormal   Collection Time    11/02/13  1:04 AM      Result Value Ref Range   Glucose-Capillary 115 (*) 70 - 99 mg/dL   Comment 1 Documented in Chart     Comment 2 Notify RN    BASIC METABOLIC PANEL     Status: Abnormal   Collection Time    11/02/13  3:48 AM      Result Value Ref Range   Sodium 136 (*) 137 - 147 mEq/L   Potassium 3.6 (*) 3.7 - 5.3 mEq/L   Chloride 101  96 - 112 mEq/L   CO2 26  19 - 32 mEq/L   Glucose, Bld 117 (*) 70 - 99 mg/dL   BUN 11  6 - 23 mg/dL   Creatinine, Ser 0.98  0.50 - 1.35 mg/dL   Calcium 8.4  8.4 - 10.5 mg/dL   GFR calc non Af Amer 88 (*) >90 mL/min   GFR calc Af Amer >90  >90 mL/min   Comment: (NOTE)     The eGFR has been calculated using the CKD EPI equation.     This calculation has not been validated in all clinical situations.     eGFR's persistently <90 mL/min signify possible Chronic Kidney     Disease.  CBC     Status: Abnormal   Collection Time    11/02/13  3:48 AM      Result Value Ref Range   WBC 11.7 (*) 4.0 - 10.5 K/uL   RBC 3.31 (*) 4.22 - 5.81 MIL/uL   Hemoglobin 10.1 (*) 13.0 - 17.0  g/dL   HCT 28.6 (*) 39.0 - 52.0 %   MCV 86.4  78.0 - 100.0 fL   MCH 30.5  26.0 - 34.0 pg   MCHC 35.3  30.0 - 36.0 g/dL   RDW 14.8  11.5 - 15.5 %   Platelets 159  150 - 400 K/uL  GLUCOSE, CAPILLARY     Status: Abnormal   Collection Time    11/02/13  3:59 AM      Result Value Ref Range   Glucose-Capillary 101 (*) 70 - 99 mg/dL   Comment 1 Documented in Chart     Comment 2 Notify RN    GLUCOSE, CAPILLARY     Status: Abnormal   Collection Time    11/02/13  7:32 AM      Result Value Ref Range   Glucose-Capillary 125 (*) 70 - 99 mg/dL   Comment 1 Documented in Chart     Comment 2 Notify RN    CBC     Status: Abnormal   Collection Time    11/03/13  5:50 AM      Result Value Ref Range   WBC 11.5 (*) 4.0 - 10.5 K/uL   RBC 3.02 (*) 4.22 - 5.81 MIL/uL   Hemoglobin 9.1 (*) 13.0 - 17.0 g/dL   HCT 25.9 (*) 39.0 - 52.0 %   MCV 85.8  78.0 - 100.0 fL   MCH 30.1  26.0 - 34.0 pg   MCHC 35.1  30.0 - 36.0 g/dL   RDW 14.9  11.5 - 15.5 %   Platelets 166  150 - 400 K/uL  BASIC METABOLIC PANEL  Status: Abnormal   Collection Time    11/03/13  5:50 AM      Result Value Ref Range   Sodium 139  137 - 147 mEq/L   Potassium 3.6 (*) 3.7 - 5.3 mEq/L   Chloride 103  96 - 112 mEq/L   CO2 24  19 - 32 mEq/L   Glucose, Bld 98  70 - 99 mg/dL   BUN 11  6 - 23 mg/dL   Creatinine, Ser 1.00  0.50 - 1.35 mg/dL   Calcium 8.2 (*) 8.4 - 10.5 mg/dL   GFR calc non Af Amer 80 (*) >90 mL/min   GFR calc Af Amer >90  >90 mL/min   Comment: (NOTE)     The eGFR has been calculated using the CKD EPI equation.     This calculation has not been validated in all clinical situations.     eGFR's persistently <90 mL/min signify possible Chronic Kidney     Disease.    Imaging: Imaging results have been reviewed  Tele: NSR  Assessment/Plan:   1. Principal Problem: 2.   NSTEMI (non-ST elevated myocardial infarction) 3. Active Problems: 4.   Tobacco abuse 5.   Acute coronary syndrome 6.   Pure  hypercholesterolemia 7.   S/P CABG x 4 8.   Time Spent Directly with Patient:  20 minutes  Length of Stay:  LOS: 8 days   POD3 CABG X 4 after NSTEMI. Looks great !! Ambulating with [facility]. NSR. VSS. Labs OK. Exam benign. Nl progression per TCTS. Follow up with Dr. Julianne Handler 2-3 weeks after D/C.  Lorretta Harp 11/03/2013, 8:19 AM

## 2013-11-04 NOTE — Progress Notes (Signed)
Clinical Social Work Department BRIEF PSYCHOSOCIAL ASSESSMENT 11/04/2013  Patient:  [name], [name]     Account Number:  [id number]     Admit date:  10/26/2013  Clinical Social Worker:  Megan Salon  Date/Time:  11/04/2013 10:51 AM  Referred by:  Physician  Date Referred:  11/04/2013 Referred for  SNF Placement   Other Referral:   Interview type:  Patient Other interview type:    PSYCHOSOCIAL DATA Living Status:  ALONE Admitted from facility:   Level of care:   Primary support name:  Lear Ng Primary support relationship to patient:  SIBLING Degree of support available:   Good    CURRENT CONCERNS Current Concerns  Post-Acute Placement   Other Concerns:    SOCIAL WORK ASSESSMENT / PLAN Clinical Social Worker received referral for SNF placement at d/c. CSW introduced self and explained reason for visit. CSW explained SNF process to patient. Patient reported he is agreeable for SNF placement because he does not have supervision at home. CSW explained that there is a chance that insurance may deny SNF based on PT. Patient verbalized understanding and states he would see if his sister would be able to help if patient could not go to SNF.Marland KitchenPatient asked social worker to call his brother, Claiborne Billings. CSW left voicemail for [place], awaiting call back.  CSW will complete FL2 for MD's signature and will update patient and family when bed offers are received.   Assessment/plan status:  Psychosocial Support/Ongoing Assessment of Needs Other assessment/ plan:   Information/referral to community resources:   SNF information    PATIENT'S/FAMILY'S RESPONSE TO PLAN OF CARE: Patient states he is agreeable to SNF placement because he does not have 24/7 supervision at home.      Jeanette Caprice, MSW, [place]

## 2013-11-04 NOTE — Progress Notes (Signed)
Tolerated ambulation in hallway 650 feet well, gait steady [name]

## 2013-11-04 NOTE — Progress Notes (Signed)
Clinical Social Work Department CLINICAL SOCIAL WORK PLACEMENT NOTE 11/04/2013  Patient:  [name], [name]  Account Number:  [id number] Admit date:  10/26/2013  Clinical Social Worker:  Megan Salon  Date/time:  11/04/2013 10:55 AM  Clinical Social Work is seeking post-discharge placement for this patient at the following level of care:   [place]   (*CSW will update this form in Epic as items are completed)   11/04/2013  Patient/family provided with [place] Department of Clinical Social Work's list of facilities offering this level of care within the geographic area requested by the patient (or if unable, by the patient's family).  11/04/2013  Patient/family informed of their freedom to choose among providers that offer the needed level of care, that participate in Medicare, Medicaid or managed care program needed by the patient, have an available bed and are willing to accept the patient.  11/04/2013  Patient/family informed of [facility]' ownership interest in [facility], as well as of the fact that they are under no obligation to receive care at this facility.  PASARR submitted to EDS on 11/04/2013 PASARR number received from EDS on 11/04/2013  FL2 transmitted to all facilities in geographic area requested by pt/family on  11/04/2013 FL2 transmitted to all facilities within larger geographic area on   Patient informed that his/her managed care company has contracts with or will negotiate with  certain facilities, including the following:     Patient/family informed of bed offers received:   Patient chooses bed at  Physician recommends and patient chooses bed at    Patient to be transferred to  on   Patient to be transferred to facility by   The following physician request were entered in Epic:   Additional Comments:  Jeanette Caprice, MSW, [place]

## 2013-11-04 NOTE — Progress Notes (Addendum)
      [address]       [place] [address]             [phone]      4 Days Post-Op Procedure(s) (LRB): CORONARY ARTERY BYPASS GRAFTING TIMES FOUR ON PUMP USING LEFT INTERNAL MAMMARY ARTERY AND RIGHT GREATER SAPHENOUS VEIN VIA ENDOVEIN HARVEST. (N/A) INTRAOPERATIVE TRANSESOPHAGEAL ECHOCARDIOGRAM (N/A)  Subjective:  [name] has no complaints.  He is awaiting social work consult for SNF placement.  + ambulation, + BM  Objective: Vital signs in last 24 hours: Temp:  [98.8 F (37.1 C)-99.6 F (37.6 C)] 99.6 F (37.6 C) (05/25 0515) Pulse Rate:  [86-94] 94 (05/25 0515) Cardiac Rhythm:  [-] Normal sinus rhythm;Sinus tachycardia (05/25 0743) Resp:  [18] 18 (05/25 0515) BP: (97-137)/(54-86) 127/67 mmHg (05/25 0515) SpO2:  [95 %-100 %] 100 % (05/25 0515) Weight:  [200 lb 13.4 oz (91.1 kg)] 200 lb 13.4 oz (91.1 kg) (05/25 0515)  Intake/Output from previous day: 05/24 0701 - 05/25 0700 In: 840 [P.O.:840] Out: 250 [Urine:250]  General appearance: alert, cooperative and no distress Heart: regular rate and rhythm Lungs: clear to auscultation bilaterally Abdomen: soft, non-tender; bowel sounds normal; no masses,  no organomegaly Extremities: edema trace Wound: clean and dry  Lab Results:  Recent Labs  11/02/13 0348 11/03/13 0550  WBC 11.7* 11.5*  HGB 10.1* 9.1*  HCT 28.6* 25.9*  PLT 159 166   BMET:  Recent Labs  11/02/13 0348 11/03/13 0550  NA 136* 139  K 3.6* 3.6*  CL 101 103  CO2 26 24  GLUCOSE 117* 98  BUN 11 11  CREATININE 0.98 1.00  CALCIUM 8.4 8.2*    PT/INR: No results found for this basename: LABPROT, INR,  in the last 72 hours ABG    Component Value Date/Time   PHART 7.324* 10/31/2013 1930   HCO3 22.5 10/31/2013 1930   TCO2 24 11/01/2013 1655   ACIDBASEDEF 3.0* 10/31/2013 1930   O2SAT 97.0 10/31/2013 1930   CBG (last 3)   Recent Labs  11/02/13 0104 11/02/13 0359 11/02/13 0732  GLUCAP 115* 101* 125*     Assessment/Plan: S/P Procedure(s) (LRB): CORONARY ARTERY BYPASS GRAFTING TIMES FOUR ON PUMP USING LEFT INTERNAL MAMMARY ARTERY AND RIGHT GREATER SAPHENOUS VEIN VIA ENDOVEIN HARVEST. (N/A) INTRAOPERATIVE TRANSESOPHAGEAL ECHOCARDIOGRAM (N/A)  1. CV- NSR good rate and pressure control- continue Coreg 2. Pulm- off oxygen, good use of IS 3. Renal- mild hypervolemia- on Lasix 4. Deconditioning- patient lives alone, PT recs SNF 5. Dispo- patient has been medically stable for discharge for 48 hours, we are still awaiting social work consult, to SNF when bed available   LOS: 9 days    [name] 11/04/2013  I have seen and examined the patient and agree with the assessment and plan as outlined.  [name] 11/04/2013 9:06 AM

## 2013-11-04 NOTE — Progress Notes (Signed)
Patient ambulating independently in hallway gait steady walked 650 feet , denies need for walker, tolerating well Alfonzo Feller

## 2013-11-05 MED ORDER — CARVEDILOL 12.5 MG PO TABS: 12.5000 mg | ORAL_TABLET | Freq: Two times a day (BID) | ORAL | Status: DC

## 2013-11-05 MED ORDER — OXYCODONE HCL 5 MG PO TABS: 5.0000 mg | ORAL_TABLET | ORAL | Status: DC | PRN

## 2013-11-05 MED ORDER — ATORVASTATIN CALCIUM 80 MG PO TABS: 80.0000 mg | ORAL_TABLET | Freq: Every day | ORAL | Status: DC

## 2013-11-05 MED ORDER — ASPIRIN 325 MG PO TBEC: 325.0000 mg | DELAYED_RELEASE_TABLET | Freq: Every day | ORAL | Status: DC

## 2013-11-05 NOTE — Progress Notes (Addendum)
Update: [employer] (patient's choice) has submitted authorization and is awaiting insurance to notify them if patient is approved for rehab. CSW continues to follow.  CSW spoke to patient's brother over the phone about dc plans. CSW explained that there is a chance insurance may deny SNF due to how well patient is ambulating. Brother verbalized his understanding and states patient can stay with a family member if he has too. CSW continues to follow.   Jeanette Caprice, MSW, [place]

## 2013-11-05 NOTE — Progress Notes (Signed)
CARDIAC REHAB PHASE I   PRE:  Rate/Rhythm: 92 SR    BP: sitting 122/70    SaO2: 100 RA  MODE:  Ambulation: 690 ft   POST:  Rate/Rhythm: 98 SR    BP: sitting 150/76     SaO2: 100 RA  Pt is ambulating independently without problems. Some SOB, SaO2 100 RA. Pt able to inspire 2250 mL on IS. Feels well. Ed completed with good reception. Pt feels he needs assistance quitting smoking but was told nicotine patches are not recommended. Gave information for community resources. Pt sts he is having cravings here in hospital. Pt is interested in [facility] and requests his name be sent to [place]. Video system is not working. [place], ACSM 11/05/2013 9:29 AM

## 2013-11-05 NOTE — Progress Notes (Signed)
Clinical Social Work Department CLINICAL SOCIAL WORK PLACEMENT NOTE 11/05/2013  Patient:  MACKLEN, WILHOITE  Account Number:  [id number] Admit date:  10/26/2013  Clinical Social Worker:  Megan Salon  Date/time:  11/04/2013 10:55 AM  Clinical Social Work is seeking post-discharge placement for this patient at the following level of care:   [place]   (*CSW will update this form in Epic as items are completed)   11/04/2013  Patient/family provided with [place] Department of Clinical Social Work's list of facilities offering this level of care within the geographic area requested by the patient (or if unable, by the patient's family).  11/04/2013  Patient/family informed of their freedom to choose among providers that offer the needed level of care, that participate in Medicare, Medicaid or managed care program needed by the patient, have an available bed and are willing to accept the patient.  11/04/2013  Patient/family informed of [facility]' ownership interest in [facility], as well as of the fact that they are under no obligation to receive care at this facility.  PASARR submitted to EDS on 11/04/2013 PASARR number received from EDS on 11/04/2013  FL2 transmitted to all facilities in geographic area requested by pt/family on  11/04/2013 FL2 transmitted to all facilities within larger geographic area on   Patient informed that his/her managed care company has contracts with or will negotiate with  certain facilities, including the following:     Patient/family informed of bed offers received:  11/04/2013 Patient chooses bed at [facility] Physician recommends and patient chooses bed at    Patient to be transferred to [facility] on  11/05/2013 Patient to be transferred to facility by FAMILY  The following physician request were entered in Epic:   Additional Comments:  Jeanette Caprice, MSW, [place]

## 2013-11-05 NOTE — Progress Notes (Signed)
Physical Therapy Discharge Patient Details Name: [name] MRN: [id number] DOB: 1956/03/04 Today's Date: 11/05/2013 Time: 9678-9381 PT Time Calculation (min): 8 min  Patient discharged from PT services secondary to goals met and no further PT needs identified.  Please see latest therapy progress note for current level of functioning and progress toward goals.    Progress and discharge plan discussed with patient and/or caregiver: Patient/Caregiver agrees with plan  GP     Shary Decamp [facility] 11/05/2013, 11:15 AM

## 2013-11-05 NOTE — Progress Notes (Signed)
Physical Therapy Treatment Patient Details Name: MAYES SANGIOVANNI MRN: [id number] DOB: 1956-05-28 Today's Date: 2013/11/21    History of Present Illness Pt adm with NSTEMI and underwent CABG x 4 on 10/31/13. History of hypertension, hyperlipidemia, tobacco abuse, cocaine abuse and COPD.    PT Comments    Pt doing well with mobility and no further PT needed.  Ready for dc from PT standpoint.    Follow Up Recommendations  No PT follow up     Equipment Recommendations  None recommended by PT    Recommendations for Other Services       Precautions / Restrictions Precautions Precautions: Sternal    Mobility  Bed Mobility Overal bed mobility: Independent                Transfers Overall transfer level: Independent                  Ambulation/Gait Ambulation/Gait assistance: Independent Ambulation Distance (Feet): 250 Feet Assistive device: None Gait Pattern/deviations: WFL(Within Functional Limits)   Gait velocity interpretation: at or above normal speed for age/gender     Stairs Stairs: Yes Stairs assistance: Modified independent (Device/Increase time) Stair Management: One rail Right Number of Stairs: 10    Wheelchair Mobility    Modified Rankin (Stroke Patients Only)       Balance Overall balance assessment: Independent   Sitting balance-Leahy Scale: Normal       Standing balance-Leahy Scale: Normal                      Cognition Arousal/Alertness: Awake/alert Behavior During Therapy: WFL for tasks assessed/performed Overall Cognitive Status: Within Functional Limits for tasks assessed                      Exercises      General Comments        Pertinent Vitals/Pain VSS    Home Living                      Prior Function            PT Goals (current goals can now be found in the care plan section) Progress towards PT goals: Goals met/education completed, patient discharged from PT     Frequency       PT Plan Other (comment) (Pt dc'd from PT)    Co-evaluation             End of Session   Activity Tolerance: Patient tolerated treatment well Patient left: in bed;with call bell/phone within reach;with family/visitor present (sitting EOB)     Time: 0998-3382 PT Time Calculation (min): 8 min  Charges:  $Gait Training: 8-22 mins                    G CodesShary Decamp Bayyinah Dukeman 11-21-2013, 11:14 AM  [employer] PT [phone]

## 2013-11-05 NOTE — Progress Notes (Signed)
Clinical Social Worker facilitated patient discharge including contacting patient, left voicemail for patient's brother and spoke to facility to confirm patient discharge plans.  Clinical information faxed to facility and patient agreeable with plan.  Patient states he is going to get a family member to bring him to [employer].  RN to call report prior to discharge.  Clinical Social Worker will sign off for now as social work intervention is no longer needed. Please consult [place] again if new need arises.  Jeanette Caprice, MSW, [place]

## 2013-11-05 NOTE — Progress Notes (Signed)
Assessment unchanged. Discussed D/C instructions with pt including f/u appointments and new medications. Verbalized understanding. RX placed in packet and given to pt. Pt instructed to give packet to admissions coordinator at facility.Tele removed. Pt left with belongings accompanied by NT.

## 2013-11-05 NOTE — Progress Notes (Signed)
CSW spoke to [employer] and they stated that they received insurance auth from [place]. CSW paged PA to inform.  Jeanette Caprice, MSW, [place]

## 2013-11-05 NOTE — Progress Notes (Addendum)
[address]       [place] [address]             [phone]      5 Days Post-Op Procedure(s) (LRB): CORONARY ARTERY BYPASS GRAFTING TIMES FOUR ON PUMP USING LEFT INTERNAL MAMMARY ARTERY AND RIGHT GREATER SAPHENOUS VEIN VIA ENDOVEIN HARVEST. (N/A) INTRAOPERATIVE TRANSESOPHAGEAL ECHOCARDIOGRAM (N/A) Subjective: Feels well  Objective: Vital signs in last 24 hours: Temp:  [98 F (36.7 C)-98.4 F (36.9 C)] 98.4 F (36.9 C) (05/26 0500) Pulse Rate:  [80-86] 80 (05/26 0500) Cardiac Rhythm:  [-] Normal sinus rhythm (05/25 2045) Resp:  [17-20] 17 (05/26 0500) BP: (115-125)/(59-62) 115/62 mmHg (05/26 0500) SpO2:  [99 %-100 %] 100 % (05/26 0500) Weight:  [196 lb 13.9 oz (89.3 kg)] 196 lb 13.9 oz (89.3 kg) (05/26 0500)  Hemodynamic parameters for last 24 hours:    Intake/Output from previous day: 05/25 0701 - 05/26 0700 In: 840 [P.O.:840] Out: 200 [Urine:200] Intake/Output this shift:    General appearance: alert, cooperative and no distress Heart: regular rate and rhythm Lungs: clear to auscultation bilaterally Abdomen: benign Extremities: trace edema Wound: incis healing well  Lab Results:  Recent Labs  11/03/13 0550  WBC 11.5*  HGB 9.1*  HCT 25.9*  PLT 166   BMET:  Recent Labs  11/03/13 0550  NA 139  K 3.6*  CL 103  CO2 24  GLUCOSE 98  BUN 11  CREATININE 1.00  CALCIUM 8.2*    PT/INR: No results found for this basename: LABPROT, INR,  in the last 72 hours ABG    Component Value Date/Time   PHART 7.324* 10/31/2013 1930   HCO3 22.5 10/31/2013 1930   TCO2 24 11/01/2013 1655   ACIDBASEDEF 3.0* 10/31/2013 1930   O2SAT 97.0 10/31/2013 1930   CBG (last 3)  No results found for this basename: GLUCAP,  in the last 72 hours Scheduled Meds: . acetaminophen  1,000 mg Oral 4 times per day  . aspirin EC  325 mg Oral Daily  . atorvastatin  80 mg Oral q1800  . bisacodyl  10 mg Oral Daily   Or  . bisacodyl  10 mg Rectal Daily  .  carvedilol  12.5 mg Oral BID WC  . docusate sodium  200 mg Oral Daily  . furosemide  40 mg Oral Daily  . mometasone-formoterol  2 puff Inhalation BID  . pantoprazole  40 mg Oral Daily  . potassium chloride  20 mEq Oral Daily  . sodium chloride  3 mL Intravenous Q12H   Continuous Infusions:  PRN Meds:.sodium chloride, albuterol, metoprolol, morphine injection, ondansetron (ZOFRAN) IV, oxyCODONE, sodium chloride, traMADol  Dg Chest 2 View  11/03/2013   CLINICAL DATA:  Atelectasis.  EXAM: CHEST  2 VIEW  COMPARISON:  11/02/2013 and 11/01/2013.  FINDINGS: The heart size and mediastinal contours are stable status post recent median sternotomy and CABG. Right IJ sheath has been removed in the interval. There is improved aeration of the lung bases with mild residual bibasilar atelectasis and small right greater than left pleural effusions. There are minimal biapical pneumothoraces. There is also small amount of epicardial air on the lateral view adjacent to the epicardial leads.  IMPRESSION: Improving aeration of both lung bases with mild bibasilar atelectasis and small bilateral hydro pneumothoraces.   Electronically Signed   By: Camie Patience M.D.   On: 11/03/2013 12:34   Dg Chest Port 1 View  11/02/2013   CLINICAL DATA:  Postop  cardiac surgery  EXAM: PORTABLE CHEST - 1 VIEW  COMPARISON:  11/01/2013  FINDINGS: Heart size appears within normal limits and is stable. Pulmonary vascularity normal. There are changes of median sternotomy for CABG. There is mild left hilar and bibasilar atelectasis. Negative for pulmonary edema. No visible pneumothorax. Right IJ sheath terminates in the proximal superior vena cava.  IMPRESSION: Scattered areas of atelectasis.   Electronically Signed   By: Curlene Dolphin M.D.   On: 11/02/2013 09:43   Assessment/Plan: S/P Procedure(s) (LRB): CORONARY ARTERY BYPASS GRAFTING TIMES FOUR ON PUMP USING LEFT INTERNAL MAMMARY ARTERY AND RIGHT GREATER SAPHENOUS VEIN VIA ENDOVEIN HARVEST.  (N/A) INTRAOPERATIVE TRANSESOPHAGEAL ECHOCARDIOGRAM (N/A)  1 awaits disposition- SNF vs home with a family member short term     LOS: 10 days    [name] 11/05/2013  Patient seen and examined, agree with above

## 2013-11-11 ENCOUNTER — Encounter: Payer: Self-pay | Admitting: Cardiovascular Disease

## 2013-11-11 ENCOUNTER — Ambulatory Visit (INDEPENDENT_AMBULATORY_CARE_PROVIDER_SITE_OTHER): Payer: No Typology Code available for payment source | Admitting: Cardiovascular Disease

## 2013-11-11 VITALS — BP 112/70 | HR 80 | Ht 72.0 in | Wt 199.0 lb

## 2013-11-11 DIAGNOSIS — F17200 Nicotine dependence, unspecified, uncomplicated: Secondary | ICD-10-CM

## 2013-11-11 DIAGNOSIS — I2510 Atherosclerotic heart disease of native coronary artery without angina pectoris: Secondary | ICD-10-CM

## 2013-11-11 DIAGNOSIS — Z720 Tobacco use: Secondary | ICD-10-CM

## 2013-11-11 DIAGNOSIS — I10 Essential (primary) hypertension: Secondary | ICD-10-CM

## 2013-11-11 DIAGNOSIS — E7800 Pure hypercholesterolemia, unspecified: Secondary | ICD-10-CM

## 2013-11-11 MED FILL — Lidocaine HCl IV Inj 20 MG/ML: INTRAVENOUS | Qty: 5 | Status: AC

## 2013-11-11 MED FILL — Sodium Chloride IV Soln 0.9%: INTRAVENOUS | Qty: 2000 | Status: AC

## 2013-11-11 MED FILL — Sodium Bicarbonate IV Soln 8.4%: INTRAVENOUS | Qty: 50 | Status: AC

## 2013-11-11 MED FILL — Heparin Sodium (Porcine) Inj 1000 Unit/ML: INTRAMUSCULAR | Qty: 30 | Status: AC

## 2013-11-11 MED FILL — Electrolyte-R (PH 7.4) Solution: INTRAVENOUS | Qty: 3000 | Status: AC

## 2013-11-11 MED FILL — Mannitol IV Soln 20%: INTRAVENOUS | Qty: 500 | Status: AC

## 2013-11-11 NOTE — Patient Instructions (Signed)
Your physician recommends that you schedule a follow-up appointment in:  3-4 months. --February 24, 2014 at 8:15

## 2013-11-11 NOTE — Progress Notes (Signed)
History of Present Illness: 58 yo male with history of tobacco abuse, cocaine abuse, CAD, COPD here today for follow up. Admitted to [facility] with NSTEMI 10/26/13. Cardiac cath 10/28/13 with severe triple vessel CAD, LVEF=55-60%. He underwent 4V CABG on 10/31/13 (LIMA to LAD, SVG to OM2/OM2, SVG to PDA). He did well following his CABG and was discharged home on 11/05/13.   He is here today for follow up. He is doing well. Exercising. No chest pain or SOB.   Primary Care Physician: Osi-Bonsu  Last Lipid Profile:Lipid Panel     Component Value Date/Time   CHOL 220* 10/26/2013 0920   TRIG 68 10/26/2013 0920   HDL 76 10/26/2013 0920   CHOLHDL 2.9 10/26/2013 0920   VLDL 14 10/26/2013 0920   LDLCALC 130* 10/26/2013 0920   Past Medical History  Diagnosis Date  . COPD (chronic obstructive pulmonary disease)   . Perforation of duodenal ulcer     a. 1970s - s/p surgery.   . H/O: GI bleed     a. 58 y/o ago, r/t ulcer. Denies recent GIB.  . H/O: eczema     lower extremities  . High cholesterol   . Hypertension   . Cocaine abuse   . Tobacco abuse   . CAD (coronary artery disease)     4V CABG 10/31/13    Past Surgical History  Procedure Laterality Date  . Stomach sx    . Coronary artery bypass graft N/A 10/31/2013    Procedure: CORONARY ARTERY BYPASS GRAFTING TIMES FOUR ON PUMP USING LEFT INTERNAL MAMMARY ARTERY AND RIGHT GREATER SAPHENOUS VEIN VIA ENDOVEIN HARVEST.;  Surgeon: Melrose Nakayama, MD;  Location: [place];  Service: Open Heart Surgery;  Laterality: N/A;  . Intraoperative transesophageal echocardiogram N/A 10/31/2013    Procedure: INTRAOPERATIVE TRANSESOPHAGEAL ECHOCARDIOGRAM;  Surgeon: Melrose Nakayama, MD;  Location: [place];  Service: Open Heart Surgery;  Laterality: N/A;    Current Outpatient Prescriptions  Medication Sig Dispense Refill  . albuterol (PROAIR HFA) 108 (90 BASE) MCG/ACT inhaler Inhale 1 puff into the lungs every 6 (six) hours as needed for wheezing or shortness  of breath.      Marland Kitchen aspirin EC 325 MG EC tablet Take 1 tablet (325 mg total) by mouth daily.      Marland Kitchen atorvastatin (LIPITOR) 80 MG tablet Take 1 tablet (80 mg total) by mouth daily at 6 PM.      . carvedilol (COREG) 12.5 MG tablet Take 1 tablet (12.5 mg total) by mouth 2 (two) times daily with a meal.      . Fluticasone-Salmeterol (ADVAIR) 250-50 MCG/DOSE AEPB Inhale 1 puff into the lungs every 12 (twelve) hours.        . nicotine (NICODERM CQ - DOSED IN MG/24 HOURS) 14 mg/24hr patch Place 14 mg onto the skin daily.      . nicotine (NICODERM CQ - DOSED IN MG/24 HR) 7 mg/24hr patch Place 7 mg onto the skin daily.      Marland Kitchen oxyCODONE (OXY IR/ROXICODONE) 5 MG immediate release tablet Take 1-2 tablets (5-10 mg total) by mouth every 4 (four) hours as needed for moderate pain.  50 tablet  0   No current facility-administered medications for this visit.    No Known Allergies  History   Social History  . Marital Status: Single    Spouse Name: N/A    Number of Children: N/A  . Years of Education: N/A   Occupational History  . Not on  file.   Social History Main Topics  . Smoking status: Former Smoker -- 0.60 packs/day for 40 years    Types: Cigarettes  . Smokeless tobacco: Never Used  . Alcohol Use: Yes     Comment: Occassionally  . Drug Use: No  . Sexual Activity: Yes    Birth Control/ Protection: None   Other Topics Concern  . Not on file   Social History Narrative  . No narrative on file    Family History  Problem Relation Age of Onset  . Lung cancer      Review of Systems:  As stated in the HPI and otherwise negative.   BP 112/70  Pulse 80  Ht 6' (1.829 m)  Wt 199 lb (90.266 kg)  BMI 26.98 kg/m2  Physical Examination: General: Well developed, well nourished, NAD HEENT: OP clear, mucus membranes moist SKIN: warm, dry. No rashes. Neuro: No focal deficits Musculoskeletal: Muscle strength 5/5 all ext Psychiatric: Mood and affect normal Neck: No JVD, no carotid bruits, no  thyromegaly, no lymphadenopathy. Lungs:Clear bilaterally, no wheezes, rhonci, crackles Cardiovascular: Regular rate and rhythm. No murmurs, gallops or rubs. Abdomen:Soft. Bowel sounds present. Non-tender.  Extremities: No lower extremity edema. Pulses are 2 + in the bilateral DP/PT.  Cardiac cath 10/28/13: Left main: No obstructive disease.  Left Anterior Descending Artery: Large caliber vessel that courses to the apex. The proximal has long hazy 80% stenosis. There is mild plaque in the mid and distal vessel. Small caliber first diagonal 70% stenosis.  Circumflex Artery: Large caliber vessel with proximal 99% stenosis. The first obtuse marginal branch is moderate in caliber with no obstructive disease. The second obtuse marginal branch is moderate in caliber with proximal 70% stenosis. The distal AV groove Circumflex has 50% stenosis.  Right Coronary Artery: Large dominant vessel with diffuse 40% mid stenosis followed by a focal 70-80% mid stenosis. The distal vessel has mild plaque disease. The PDA is a moderate caliber vessel with serial 40% stenoses.  Left Ventricular Angiogram: LVEF=55-60%   Assessment and Plan:   1. CAD: s/p 4V CABG 10/31/13. He is doing well. Continue ASA, statin, beta blocker. LV function is normal.   2. Tobacco abuse: He has stopped smoking.   3. HTN: BP controlled. No changes.   4. HLD: Continue statin.

## 2013-12-09 ENCOUNTER — Other Ambulatory Visit: Payer: Self-pay | Admitting: Thoracic Surgery (Cardiothoracic Vascular Surgery)

## 2013-12-09 DIAGNOSIS — I214 Non-ST elevation (NSTEMI) myocardial infarction: Secondary | ICD-10-CM

## 2013-12-10 ENCOUNTER — Ambulatory Visit
Admission: RE | Admit: 2013-12-10 | Discharge: 2013-12-10 | Disposition: A | Discharge status: Home / Self Care | Payer: No Typology Code available for payment source | Source: Ambulatory Visit | Attending: Thoracic Surgery (Cardiothoracic Vascular Surgery) | Admitting: Thoracic Surgery (Cardiothoracic Vascular Surgery)

## 2013-12-10 ENCOUNTER — Encounter: Payer: Self-pay | Admitting: Thoracic Surgery (Cardiothoracic Vascular Surgery)

## 2013-12-10 ENCOUNTER — Ambulatory Visit (INDEPENDENT_AMBULATORY_CARE_PROVIDER_SITE_OTHER): Payer: Self-pay | Admitting: Thoracic Surgery (Cardiothoracic Vascular Surgery)

## 2013-12-10 VITALS — BP 124/69 | HR 81 | Resp 20 | Ht 72.0 in | Wt 199.0 lb

## 2013-12-10 DIAGNOSIS — I214 Non-ST elevation (NSTEMI) myocardial infarction: Secondary | ICD-10-CM

## 2013-12-10 DIAGNOSIS — Z9889 Other specified postprocedural states: Secondary | ICD-10-CM

## 2013-12-10 DIAGNOSIS — Z951 Presence of aortocoronary bypass graft: Secondary | ICD-10-CM

## 2013-12-10 NOTE — Progress Notes (Signed)
HPI:  Mr. Reierson returns for a scheduled postoperative followup visit.  He is a 58 year old gentleman with multiple cardiac risk factors and a history of multiple substance abuse. He  presented following an episode of unstable chest pressure. He ruled in for a non-Q-wave MI. At catheterization, he had severe three-vessel disease. He underwent coronary bypass grafting x4 on May 21. His postoperative course was uncomplicated and he was discharged home on postoperative day #5.  He says that his been doing well. He has some mild soreness in his chest, but no pain severe enough to warrant pain medication at this point. He has been walking without any difficulties or shortness of breath. He has not had any recurrent angina. He says that he has driven a couple of times to the store and did not have any difficulty with that. He initially was not smoking at all but says that he is now smoking about 3 cigarettes a day.  Past Medical History  Diagnosis Date  . COPD (chronic obstructive pulmonary disease)   . Perforation of duodenal ulcer     a. 1970s - s/p surgery.   . H/O: GI bleed     a. 58 y/o ago, r/t ulcer. Denies recent GIB.  . H/O: eczema     lower extremities  . High cholesterol   . Hypertension   . Cocaine abuse   . Tobacco abuse   . CAD (coronary artery disease)     4V CABG 10/31/13     Current Outpatient Prescriptions  Medication Sig Dispense Refill  . albuterol (PROAIR HFA) 108 (90 BASE) MCG/ACT inhaler Inhale 1 puff into the lungs every 6 (six) hours as needed for wheezing or shortness of breath.      Marland Kitchen aspirin EC 325 MG EC tablet Take 1 tablet (325 mg total) by mouth daily.      Marland Kitchen atorvastatin (LIPITOR) 80 MG tablet Take 1 tablet (80 mg total) by mouth daily at 6 PM.      . carvedilol (COREG) 12.5 MG tablet Take 1 tablet (12.5 mg total) by mouth 2 (two) times daily with a meal.      . Fluticasone-Salmeterol (ADVAIR) 250-50 MCG/DOSE AEPB Inhale 1 puff into the lungs every 12  (twelve) hours.        Marland Kitchen oxyCODONE (OXY IR/ROXICODONE) 5 MG immediate release tablet Take 1-2 tablets (5-10 mg total) by mouth every 4 (four) hours as needed for moderate pain.  50 tablet  0  . nicotine (NICODERM CQ - DOSED IN MG/24 HOURS) 14 mg/24hr patch Place 14 mg onto the skin daily.      . nicotine (NICODERM CQ - DOSED IN MG/24 HR) 7 mg/24hr patch Place 7 mg onto the skin daily.       No current facility-administered medications for this visit.    Physical Exam BP 124/69  Pulse 81  Resp 20  Ht 6' (1.829 m)  Wt 199 lb (90.266 kg)  BMI 26.98 kg/m2  SpO83 70% 58 year old man in no acute distress Alert and oriented x3 with no focal neurologic deficits Cardiac regular rate and rhythm normal S1 and S2 Lungs clear with equal breath sounds bilaterally Sternum stable, incision healing well Leg incision healing well, no peripheral edema  Diagnostic Tests: CHEST 2 VIEW  COMPARISON: Nov 03, 2013  FINDINGS:  The heart size and mediastinal contours are stable. Patient is  status post prior CABG and median sternotomy. There is no focal  infiltrate, pulmonary edema, or pleural effusion. There is no  pneumothorax. The visualized skeletal structures are stable.  IMPRESSION:  No active cardiopulmonary disease.  Electronically Signed  By: Abelardo Diesel M.D.  On: 12/10/2013 14:34  Impression: 58 year old gentleman with multiple cardiac risk factors who presented with a non-ST elevation MI. He had three-vessel disease a catheterization. He underwent coronary bypass grafting x4 on May 21. His postoperative course was uncomplicated. He has continued to make good progress since discharge.  He is having minimal discomfort is not requiring any narcotics at this point. Him actually impressed with that given his past history of polysubstance use. He unfortunately has started smoking again. I am going to authorize a nicotine patch notes that will help him quit smoking altogether. I also emphasized the  importance of avoiding other substances such as cocaine which can have a negative impact on him.  He is almost 6 weeks out from surgery at this point and his sternum is stable. There are no restrictions on his physical activities although I did by some to gradually increase them rather than making dramatic changes. He may drive. Appropriate precautions were discussed.  Plan:  He'll continue followup with Dr. Lauree Chandler  I will be happy to see him back any time if I can be of any further assistance with his care per

## 2014-02-24 ENCOUNTER — Encounter: Payer: No Typology Code available for payment source | Admitting: Cardiovascular Disease

## 2014-02-24 NOTE — Progress Notes (Signed)
No show

## 2014-02-25 ENCOUNTER — Encounter: Payer: Self-pay | Admitting: Cardiology

## 2014-03-24 ENCOUNTER — Encounter: Payer: Self-pay | Admitting: Cardiovascular Disease

## 2014-05-22 ENCOUNTER — Encounter (HOSPITAL_COMMUNITY): Payer: Self-pay | Admitting: Cardiovascular Disease

## 2014-09-01 ENCOUNTER — Inpatient Hospital Stay (HOSPITAL_COMMUNITY): Payer: No Typology Code available for payment source

## 2014-09-01 ENCOUNTER — Inpatient Hospital Stay (HOSPITAL_COMMUNITY)
Admission: EM | Admit: 2014-09-01 | Discharge: 2014-09-04 | DRG: 189 | Disposition: A | Discharge status: Home / Self Care | Payer: No Typology Code available for payment source | Attending: Internal Medicine | Admitting: Internal Medicine

## 2014-09-01 ENCOUNTER — Encounter (HOSPITAL_COMMUNITY): Payer: Self-pay | Admitting: Emergency Medicine

## 2014-09-01 ENCOUNTER — Emergency Department (HOSPITAL_COMMUNITY): Payer: No Typology Code available for payment source

## 2014-09-01 DIAGNOSIS — F1410 Cocaine abuse, uncomplicated: Secondary | ICD-10-CM | POA: Diagnosis present

## 2014-09-01 DIAGNOSIS — J9601 Acute respiratory failure with hypoxia: Principal | ICD-10-CM | POA: Diagnosis present

## 2014-09-01 DIAGNOSIS — F17210 Nicotine dependence, cigarettes, uncomplicated: Secondary | ICD-10-CM | POA: Diagnosis present

## 2014-09-01 DIAGNOSIS — K279 Peptic ulcer, site unspecified, unspecified as acute or chronic, without hemorrhage or perforation: Secondary | ICD-10-CM | POA: Diagnosis present

## 2014-09-01 DIAGNOSIS — Z6824 Body mass index (BMI) 24.0-24.9, adult: Secondary | ICD-10-CM

## 2014-09-01 DIAGNOSIS — K219 Gastro-esophageal reflux disease without esophagitis: Secondary | ICD-10-CM | POA: Diagnosis present

## 2014-09-01 DIAGNOSIS — E7800 Pure hypercholesterolemia, unspecified: Secondary | ICD-10-CM | POA: Diagnosis present

## 2014-09-01 DIAGNOSIS — J440 Chronic obstructive pulmonary disease with acute lower respiratory infection: Secondary | ICD-10-CM | POA: Diagnosis present

## 2014-09-01 DIAGNOSIS — E785 Hyperlipidemia, unspecified: Secondary | ICD-10-CM | POA: Diagnosis present

## 2014-09-01 DIAGNOSIS — Z720 Tobacco use: Secondary | ICD-10-CM | POA: Diagnosis not present

## 2014-09-01 DIAGNOSIS — Z951 Presence of aortocoronary bypass graft: Secondary | ICD-10-CM | POA: Diagnosis not present

## 2014-09-01 DIAGNOSIS — I2510 Atherosclerotic heart disease of native coronary artery without angina pectoris: Secondary | ICD-10-CM | POA: Diagnosis present

## 2014-09-01 DIAGNOSIS — Z8711 Personal history of peptic ulcer disease: Secondary | ICD-10-CM | POA: Diagnosis not present

## 2014-09-01 DIAGNOSIS — I509 Heart failure, unspecified: Secondary | ICD-10-CM | POA: Diagnosis not present

## 2014-09-01 DIAGNOSIS — E43 Unspecified severe protein-calorie malnutrition: Secondary | ICD-10-CM | POA: Diagnosis present

## 2014-09-01 DIAGNOSIS — I252 Old myocardial infarction: Secondary | ICD-10-CM

## 2014-09-01 DIAGNOSIS — J9600 Acute respiratory failure, unspecified whether with hypoxia or hypercapnia: Secondary | ICD-10-CM | POA: Diagnosis present

## 2014-09-01 DIAGNOSIS — J441 Chronic obstructive pulmonary disease with (acute) exacerbation: Secondary | ICD-10-CM | POA: Insufficient documentation

## 2014-09-01 DIAGNOSIS — Z7982 Long term (current) use of aspirin: Secondary | ICD-10-CM

## 2014-09-01 DIAGNOSIS — J209 Acute bronchitis, unspecified: Secondary | ICD-10-CM | POA: Diagnosis present

## 2014-09-01 DIAGNOSIS — I10 Essential (primary) hypertension: Secondary | ICD-10-CM | POA: Diagnosis not present

## 2014-09-01 DIAGNOSIS — R0603 Acute respiratory distress: Secondary | ICD-10-CM

## 2014-09-01 DIAGNOSIS — R0602 Shortness of breath: Secondary | ICD-10-CM | POA: Diagnosis present

## 2014-09-01 DIAGNOSIS — I255 Ischemic cardiomyopathy: Secondary | ICD-10-CM

## 2014-09-01 HISTORY — DX: Gastro-esophageal reflux disease without esophagitis: K21.9

## 2014-09-01 HISTORY — DX: Acute myocardial infarction, unspecified: I21.9

## 2014-09-01 LAB — RAPID URINE DRUG SCREEN, HOSP PERFORMED
AMPHETAMINES: NOT DETECTED
BARBITURATES: NOT DETECTED
Benzodiazepines: NOT DETECTED
Cocaine: POSITIVE — AB
Opiates: NOT DETECTED
TETRAHYDROCANNABINOL: NOT DETECTED

## 2014-09-01 LAB — BASIC METABOLIC PANEL
Anion gap: 12 (ref 5–15)
BUN: 8 mg/dL (ref 6–23)
CO2: 25 mmol/L (ref 19–32)
CREATININE: 1.18 mg/dL (ref 0.50–1.35)
Calcium: 9.1 mg/dL (ref 8.4–10.5)
Chloride: 102 mmol/L (ref 96–112)
GFR calc Af Amer: 76 mL/min — ABNORMAL LOW (ref >90)
GFR calc non Af Amer: 65 mL/min — ABNORMAL LOW (ref >90)
Glucose, Bld: 112 mg/dL — ABNORMAL HIGH (ref 70–99)
POTASSIUM: 3.9 mmol/L (ref 3.5–5.1)
Sodium: 139 mmol/L (ref 135–145)

## 2014-09-01 LAB — INFLUENZA PANEL BY PCR (TYPE A & B)
H1N1 flu by pcr: NOT DETECTED
INFLBPCR: NEGATIVE
Influenza A By PCR: NEGATIVE

## 2014-09-01 LAB — CBC WITH DIFFERENTIAL/PLATELET
BASOS ABS: 0 10*3/uL (ref 0.0–0.1)
BASOS PCT: 0 % (ref 0–1)
EOS PCT: 9 % — AB (ref 0–5)
Eosinophils Absolute: 1 10*3/uL — ABNORMAL HIGH (ref 0.0–0.7)
HEMATOCRIT: 43.1 % (ref 39.0–52.0)
Hemoglobin: 14.3 g/dL (ref 13.0–17.0)
Lymphocytes Relative: 24 % (ref 12–46)
Lymphs Abs: 2.4 10*3/uL (ref 0.7–4.0)
MCH: 27.9 pg (ref 26.0–34.0)
MCHC: 33.2 g/dL (ref 30.0–36.0)
MCV: 84.2 fL (ref 78.0–100.0)
MONO ABS: 0.9 10*3/uL (ref 0.1–1.0)
Monocytes Relative: 9 % (ref 3–12)
Neutro Abs: 6 10*3/uL (ref 1.7–7.7)
Neutrophils Relative %: 58 % (ref 43–77)
Platelets: 334 10*3/uL (ref 150–400)
RBC: 5.12 MIL/uL (ref 4.22–5.81)
RDW: 17.5 % — ABNORMAL HIGH (ref 11.5–15.5)
WBC: 10.3 10*3/uL (ref 4.0–10.5)

## 2014-09-01 LAB — I-STAT TROPONIN, ED
Troponin i, poc: 0.01 ng/mL (ref 0.00–0.08)
Troponin i, poc: 0 ng/mL (ref 0.00–0.08)

## 2014-09-01 LAB — BRAIN NATRIURETIC PEPTIDE: B Natriuretic Peptide: 226.5 pg/mL — ABNORMAL HIGH (ref 0.0–100.0)

## 2014-09-01 LAB — PROTIME-INR
INR: 0.92 (ref 0.00–1.49)
Prothrombin Time: 12.5 s (ref 11.6–15.2)

## 2014-09-01 LAB — APTT: aPTT: 39 s — ABNORMAL HIGH (ref 24–37)

## 2014-09-01 MED ORDER — FUROSEMIDE 10 MG/ML IJ SOLN
40.0000 mg | Freq: Once | INTRAMUSCULAR | Status: AC
  Administered 2014-09-01: 40 mg via INTRAVENOUS
  Filled 2014-09-01: qty 4

## 2014-09-01 MED ORDER — ASPIRIN 81 MG PO CHEW
324.0000 mg | CHEWABLE_TABLET | Freq: Once | ORAL | Status: AC
  Administered 2014-09-01: 324 mg via ORAL
  Filled 2014-09-01: qty 4

## 2014-09-01 MED ORDER — NICOTINE 14 MG/24HR TD PT24
14.0000 mg | MEDICATED_PATCH | Freq: Every day | TRANSDERMAL | Status: DC
  Administered 2014-09-01 – 2014-09-04 (×4): 14 mg via TRANSDERMAL
  Filled 2014-09-01 (×4): qty 1

## 2014-09-01 MED ORDER — MOMETASONE FURO-FORMOTEROL FUM 100-5 MCG/ACT IN AERO
2.0000 | INHALATION_SPRAY | Freq: Two times a day (BID) | RESPIRATORY_TRACT | Status: DC
  Administered 2014-09-01 – 2014-09-03 (×6): 2 via RESPIRATORY_TRACT
  Filled 2014-09-01: qty 8.8

## 2014-09-01 MED ORDER — OXYMETAZOLINE HCL 0.05 % NA SOLN
1.0000 | Freq: Two times a day (BID) | NASAL | Status: AC
  Administered 2014-09-01 – 2014-09-03 (×6): 1 via NASAL
  Filled 2014-09-01: qty 15

## 2014-09-01 MED ORDER — ALBUTEROL (5 MG/ML) CONTINUOUS INHALATION SOLN
5.0000 mg/h | INHALATION_SOLUTION | RESPIRATORY_TRACT | Status: DC
  Administered 2014-09-01: 5 mg/h via RESPIRATORY_TRACT
  Filled 2014-09-01: qty 20

## 2014-09-01 MED ORDER — ENSURE ENLIVE PO LIQD
237.0000 mL | Freq: Two times a day (BID) | ORAL | Status: DC
  Administered 2014-09-01: 237 mL via ORAL

## 2014-09-01 MED ORDER — SODIUM CHLORIDE 0.9 % IV BOLUS (SEPSIS)
1000.0000 mL | Freq: Once | INTRAVENOUS | Status: AC
  Administered 2014-09-01: 1000 mL via INTRAVENOUS

## 2014-09-01 MED ORDER — ONDANSETRON HCL 4 MG PO TABS: 4.0000 mg | ORAL_TABLET | Freq: Four times a day (QID) | ORAL | Status: DC | PRN

## 2014-09-01 MED ORDER — IPRATROPIUM-ALBUTEROL 0.5-2.5 (3) MG/3ML IN SOLN
3.0000 mL | RESPIRATORY_TRACT | Status: DC
  Administered 2014-09-01 – 2014-09-03 (×15): 3 mL via RESPIRATORY_TRACT
  Filled 2014-09-01 (×16): qty 3

## 2014-09-01 MED ORDER — ALBUTEROL SULFATE (2.5 MG/3ML) 0.083% IN NEBU: 2.5000 mg | INHALATION_SOLUTION | Freq: Four times a day (QID) | RESPIRATORY_TRACT | Status: DC | PRN

## 2014-09-01 MED ORDER — METHYLPREDNISOLONE SODIUM SUCC 125 MG IJ SOLR
60.0000 mg | Freq: Four times a day (QID) | INTRAMUSCULAR | Status: DC
  Administered 2014-09-01 – 2014-09-03 (×10): 60 mg via INTRAVENOUS
  Filled 2014-09-01: qty 0.96
  Filled 2014-09-01 (×3): qty 2
  Filled 2014-09-01 (×2): qty 0.96
  Filled 2014-09-01: qty 2
  Filled 2014-09-01 (×4): qty 0.96
  Filled 2014-09-01: qty 2
  Filled 2014-09-01 (×2): qty 0.96

## 2014-09-01 MED ORDER — CARVEDILOL 12.5 MG PO TABS
12.5000 mg | ORAL_TABLET | Freq: Two times a day (BID) | ORAL | Status: DC
  Administered 2014-09-01 – 2014-09-04 (×6): 12.5 mg via ORAL
  Filled 2014-09-01 (×10): qty 1

## 2014-09-01 MED ORDER — SODIUM CHLORIDE 0.9 % IJ SOLN
3.0000 mL | Freq: Two times a day (BID) | INTRAMUSCULAR | Status: DC
  Administered 2014-09-01 – 2014-09-04 (×7): 3 mL via INTRAVENOUS
  Filled 2014-09-01: qty 3

## 2014-09-01 MED ORDER — HEPARIN SODIUM (PORCINE) 5000 UNIT/ML IJ SOLN
4000.0000 [IU] | INTRAMUSCULAR | Status: AC
  Administered 2014-09-01: 4000 [IU] via INTRAVENOUS
  Filled 2014-09-01: qty 1

## 2014-09-01 MED ORDER — FLUTICASONE PROPIONATE 50 MCG/ACT NA SUSP
2.0000 | Freq: Every day | NASAL | Status: DC
  Administered 2014-09-01 – 2014-09-04 (×4): 2 via NASAL
  Filled 2014-09-01: qty 16

## 2014-09-01 MED ORDER — ATORVASTATIN CALCIUM 80 MG PO TABS
80.0000 mg | ORAL_TABLET | Freq: Every day | ORAL | Status: DC
  Administered 2014-09-01 – 2014-09-03 (×3): 80 mg via ORAL
  Filled 2014-09-01 (×5): qty 1

## 2014-09-01 MED ORDER — ASPIRIN EC 325 MG PO TBEC
325.0000 mg | DELAYED_RELEASE_TABLET | Freq: Every day | ORAL | Status: DC
  Administered 2014-09-02 – 2014-09-04 (×3): 325 mg via ORAL
  Filled 2014-09-01 (×3): qty 1

## 2014-09-01 MED ORDER — ALBUTEROL SULFATE HFA 108 (90 BASE) MCG/ACT IN AERS: 1.0000 | INHALATION_SPRAY | Freq: Four times a day (QID) | RESPIRATORY_TRACT | Status: DC | PRN

## 2014-09-01 MED ORDER — ENOXAPARIN SODIUM 40 MG/0.4ML ~~LOC~~ SOLN
40.0000 mg | SUBCUTANEOUS | Status: DC
  Administered 2014-09-01 – 2014-09-04 (×4): 40 mg via SUBCUTANEOUS
  Filled 2014-09-01 (×5): qty 0.4

## 2014-09-01 MED ORDER — MAGNESIUM HYDROXIDE 400 MG/5ML PO SUSP: 30.0000 mL | Freq: Every day | ORAL | Status: DC | PRN

## 2014-09-01 MED ORDER — LEVOFLOXACIN IN D5W 500 MG/100ML IV SOLN
500.0000 mg | INTRAVENOUS | Status: DC
  Administered 2014-09-01 – 2014-09-03 (×3): 500 mg via INTRAVENOUS
  Filled 2014-09-01 (×3): qty 100

## 2014-09-01 MED ORDER — INFLUENZA VAC SPLIT QUAD 0.5 ML IM SUSY
0.5000 mL | PREFILLED_SYRINGE | INTRAMUSCULAR | Status: AC
  Administered 2014-09-02: 0.5 mL via INTRAMUSCULAR
  Filled 2014-09-01: qty 0.5

## 2014-09-01 MED ORDER — ALUM & MAG HYDROXIDE-SIMETH 200-200-20 MG/5ML PO SUSP: 30.0000 mL | Freq: Four times a day (QID) | ORAL | Status: DC | PRN

## 2014-09-01 MED ORDER — SODIUM CHLORIDE 0.9 % IV SOLN
INTRAVENOUS | Status: DC
  Administered 2014-09-01: 05:00:00 via INTRAVENOUS

## 2014-09-01 MED ORDER — MAGNESIUM SULFATE 2 GM/50ML IV SOLN
2.0000 g | Freq: Once | INTRAVENOUS | Status: AC
  Administered 2014-09-01: 2 g via INTRAVENOUS
  Filled 2014-09-01: qty 50

## 2014-09-01 MED ORDER — ONDANSETRON HCL 4 MG/2ML IJ SOLN
4.0000 mg | Freq: Four times a day (QID) | INTRAMUSCULAR | Status: DC | PRN
  Administered 2014-09-02: 4 mg via INTRAVENOUS
  Filled 2014-09-01: qty 2

## 2014-09-01 MED ORDER — LORATADINE 10 MG PO TABS
10.0000 mg | ORAL_TABLET | Freq: Every day | ORAL | Status: DC
  Administered 2014-09-01 – 2014-09-04 (×4): 10 mg via ORAL
  Filled 2014-09-01 (×4): qty 1

## 2014-09-01 NOTE — ED Notes (Signed)
Ordered breakfast tray  

## 2014-09-01 NOTE — ED Provider Notes (Signed)
CSN: [id number]     Arrival date & time 09/01/14  0411 History   First MD Initiated Contact with Patient 09/01/14 0422     Chief Complaint  Patient presents with  . Shortness of Breath     (Consider location/radiation/quality/duration/timing/severity/associated sxs/prior Treatment) HPI   [name] is a 59 y.o. male with past medical history of COPD, coronary artery disease status post CABG presenting today with shortness of breath. Patient states he has had viral URI like symptoms for the past 2 days. This normally causes him to have a COPD flare. He states today he felt like he cannot breathe, he denies any chest pain. He has had a productive cough of opaque sputum. He states he uses albuterol and his Combivent as well without any relief. EMS initially obtain O2 sat of 80% and he was placed on nonrebreather. EMS gave the patient 10 mg of albuterol, Solu-Medrol, magnesium. Patient is currently denying any further complaints.  10 Systems reviewed and are negative for acute change except as noted in the HPI.   Past Medical History  Diagnosis Date  . COPD (chronic obstructive pulmonary disease)   . Perforation of duodenal ulcer     a. 1970s - s/p surgery.   . H/O: GI bleed     a. 59 y/o ago, r/t ulcer. Denies recent GIB.  . H/O: eczema     lower extremities  . High cholesterol   . Hypertension   . Cocaine abuse   . Tobacco abuse   . CAD (coronary artery disease)     4V CABG 10/31/13   Past Surgical History  Procedure Laterality Date  . Stomach sx    . Coronary artery bypass graft N/A 10/31/2013    Procedure: CORONARY ARTERY BYPASS GRAFTING TIMES FOUR ON PUMP USING LEFT INTERNAL MAMMARY ARTERY AND RIGHT GREATER SAPHENOUS VEIN VIA ENDOVEIN HARVEST.;  Surgeon: Melrose Nakayama, MD;  Location: [place];  Service: Open Heart Surgery;  Laterality: N/A;  . Intraoperative transesophageal echocardiogram N/A 10/31/2013    Procedure: INTRAOPERATIVE TRANSESOPHAGEAL ECHOCARDIOGRAM;   Surgeon: Melrose Nakayama, MD;  Location: [place];  Service: Open Heart Surgery;  Laterality: N/A;  . Left heart catheterization with coronary angiogram N/A 10/28/2013    Procedure: LEFT HEART CATHETERIZATION WITH CORONARY ANGIOGRAM;  Surgeon: Burnell Blanks, MD;  Location: [facility] C[facility];  Service: Cardiovascular;  Laterality: N/A;   Family History  Problem Relation Age of Onset  . Lung cancer     History  Substance Use Topics  . Smoking status: Former Smoker -- 0.60 packs/day for 40 years    Types: Cigarettes  . Smokeless tobacco: Never Used  . Alcohol Use: Yes     Comment: Occassionally    Review of Systems    Allergies  Review of patient's allergies indicates no known allergies.  Home Medications   Prior to Admission medications   Medication Sig Start Date End Date Taking? Authorizing Provider  albuterol (PROAIR HFA) 108 (90 BASE) MCG/ACT inhaler Inhale 1 puff into the lungs every 6 (six) hours as needed for wheezing or shortness of breath.    Historical Provider, MD  aspirin EC 325 MG EC tablet Take 1 tablet (325 mg total) by mouth daily. 11/05/13   Wayne E Gold, PA-C  atorvastatin (LIPITOR) 80 MG tablet Take 1 tablet (80 mg total) by mouth daily at 6 PM. 11/05/13   Wayne E Gold, PA-C  carvedilol (COREG) 12.5 MG tablet Take 1 tablet (12.5 mg total) by mouth  2 (two) times daily with a meal. 11/05/13   Wayne E Gold, PA-C  Fluticasone-Salmeterol (ADVAIR) 250-50 MCG/DOSE AEPB Inhale 1 puff into the lungs every 12 (twelve) hours.      Historical Provider, MD  nicotine (NICODERM CQ - DOSED IN MG/24 HOURS) 14 mg/24hr patch Place 14 mg onto the skin daily.    Historical Provider, MD  nicotine (NICODERM CQ - DOSED IN MG/24 HR) 7 mg/24hr patch Place 7 mg onto the skin daily.    Historical Provider, MD  oxyCODONE (OXY IR/ROXICODONE) 5 MG immediate release tablet Take 1-2 tablets (5-10 mg total) by mouth every 4 (four) hours as needed for moderate pain. 11/05/13   Wayne E Gold, PA-C    Pulse 100  Resp 22  SpO2 100% Physical Exam  Constitutional: He is oriented to person, place, and time. Vital signs are normal. He appears well-developed and well-nourished.  Non-toxic appearance. He does not appear ill. No distress.  HENT:  Head: Normocephalic and atraumatic.  Nose: Nose normal.  Mouth/Throat: Oropharynx is clear and moist. No oropharyngeal exudate.  Eyes: Conjunctivae and EOM are normal. Pupils are equal, round, and reactive to light. No scleral icterus.  Neck: Normal range of motion. Neck supple. No tracheal deviation, no edema, no erythema and normal range of motion present. No thyroid mass and no thyromegaly present.  Cardiovascular: Regular rhythm, S1 normal, S2 normal, normal heart sounds, intact distal pulses and normal pulses.  Exam reveals no gallop and no friction rub.   No murmur heard. Pulses:      Radial pulses are 2+ on the right side, and 2+ on the left side.       Dorsalis pedis pulses are 2+ on the right side, and 2+ on the left side.  tachycardia  Pulmonary/Chest: He is in respiratory distress. He has wheezes. He has no rhonchi. He has no rales.  Tachypnea, inc work of breathing  Abdominal: Soft. Normal appearance and bowel sounds are normal. He exhibits no distension, no ascites and no mass. There is no hepatosplenomegaly. There is no tenderness. There is no rebound, no guarding and no CVA tenderness.  Musculoskeletal: Normal range of motion. He exhibits no edema or tenderness.  Lymphadenopathy:    He has no cervical adenopathy.  Neurological: He is alert and oriented to person, place, and time. He has normal strength. No cranial nerve deficit or sensory deficit.  Skin: Skin is warm, dry and intact. No petechiae and no rash noted. He is not diaphoretic. No erythema. No pallor.  Psychiatric: He has a normal mood and affect. His behavior is normal. Judgment normal.  Nursing note and vitals reviewed.   ED Course  Procedures (including critical care  time) Labs Review Labs Reviewed  CBC WITH DIFFERENTIAL/PLATELET - Abnormal; Notable for the following:    RDW 17.5 (*)    Eosinophils Relative 9 (*)    Eosinophils Absolute 1.0 (*)    All other components within normal limits  BASIC METABOLIC PANEL - Abnormal; Notable for the following:    Glucose, Bld 112 (*)    GFR calc non Af Amer 65 (*)    GFR calc Af Amer 76 (*)    All other components within normal limits  APTT - Abnormal; Notable for the following:    aPTT 39 (*)    All other components within normal limits  PROTIME-INR  I-STAT TROPOININ, ED    Imaging Review No results found.   EKG Interpretation   Date/Time:  Monday September 01 2014 05:04:33 EDT Ventricular Rate:  88 PR Interval:  150 QRS Duration: 106 QT Interval:  393 QTC Calculation: 475 R Axis:   21 Text Interpretation:  No significant change since last tracing Confirmed  by Glynn Octave [phone]) on 09/01/2014 5:12:10 AM      MDM   Final diagnoses:  COPD exacerbation    Patient presents emergency department for shortness of breath that is consistent with his COPD exacerbation. He was given 5 more milligrams of albuterol in emergency department as well as 2 more milligrams of magnesium. Chest x-ray will be obtained for pneumonia, patient was given IV fluids. EKG was obtained, significant changes from prior EKG are seen including ST elevation in aVL with diffuse ST depression in the inferior leads. Although patient is not complaining of any chest pain and his initial troponin is negative, I did call a code STEMI. Cardiology, Dr. Chalmers Cater, evaluated the patient does not believe that this is a STEMI. Repeat EKG shows the same morphology. I still have concern with NEW and persistent ATE in AvL and depression in multiple other leads.  Will obtain a third EKG in the emergency department along with a repeat troponin. Patient's respiratory status has improved. He was initially 80% on room air, he is now 90% on room  air. He will require admission for COPD exacerbation and cardiac evaluation. Chest x-ray still pending at this time for possible pneumonia.  CRITICAL CARE Performed by: Everlene Balls   Total critical care time: 67min. - resp distress  Critical care time was exclusive of separately billable procedures and treating other patients.  Critical care was necessary to treat or prevent imminent or life-threatening deterioration.  Critical care was time spent personally by me on the following activities: development of treatment plan with patient and/or surrogate as well as nursing, discussions with consultants, evaluation of patient's response to treatment, examination of patient, obtaining history from patient or surrogate, ordering and performing treatments and interventions, ordering and review of laboratory studies, ordering and review of radiographic studies, pulse oximetry and re-evaluation of patient's condition.      Everlene Balls, MD 09/01/14 1438

## 2014-09-01 NOTE — ED Notes (Signed)
Pt arrives from home via [facility], woke up in the night SOB.  Pt reports "cold" for past 2 days, hx COPD, out of home meds x2-3 weeks, 1 episode of incontinence.  Per EMS Fire reported initial O2 sats in low 80's, placed pt on nonrebreather.  EMS initial O2 96% on 15L.  EMS reports giving :  Albuterol 5mg  DuoNeb Solu-medrol 125 Mag 2  Pt AOx4, talking in full sentences at this time, NAD noted, denies pain at this time.

## 2014-09-01 NOTE — ED Notes (Signed)
ADMITTING TEAM PAGED FOR ORDER CLARIFICATION

## 2014-09-01 NOTE — Progress Notes (Signed)
Asked to see patient for code STEMI.  EKG does not show ST elevation but shows inferolateral T wave inversions consistent with ischemia.  No chest pain.  States he has had a cold the past couple of days and woke up dyspneic with exertion.  Out of medicines for 2-3 weeks.  Code STEMI canceled as troponin negative and no ST elevation.  Further workup and disposition per emergency room.  Dr. Gwenlyn Found of interventional cardiology also reviewed EKG.  Kerry Hough MD [facility]

## 2014-09-01 NOTE — H&P (Signed)
[facility] History and Physical                                                                                    [name], is a 59 y.o. male  MRN: [id number]   DOB - 1955/09/25  Admit Date - 09/01/2014  Outpatient Primary MD for the patient is Elizabeth Palau, MD  With History of -  Past Medical History  Diagnosis Date  . COPD (chronic obstructive pulmonary disease)   . Perforation of duodenal ulcer     a. 1970s - s/p surgery.   . H/O: GI bleed     a. 58 y/o ago, r/t ulcer. Denies recent GIB.  . H/O: eczema     lower extremities  . High cholesterol   . Hypertension   . Cocaine abuse   . Tobacco abuse   . CAD (coronary artery disease)     4V CABG 10/31/13      Past Surgical History  Procedure Laterality Date  . Stomach sx    . Coronary artery bypass graft N/A 10/31/2013    Procedure: CORONARY ARTERY BYPASS GRAFTING TIMES FOUR ON PUMP USING LEFT INTERNAL MAMMARY ARTERY AND RIGHT GREATER SAPHENOUS VEIN VIA ENDOVEIN HARVEST.;  Surgeon: Melrose Nakayama, MD;  Location: [place];  Service: Open Heart Surgery;  Laterality: N/A;  . Intraoperative transesophageal echocardiogram N/A 10/31/2013    Procedure: INTRAOPERATIVE TRANSESOPHAGEAL ECHOCARDIOGRAM;  Surgeon: Melrose Nakayama, MD;  Location: [place];  Service: Open Heart Surgery;  Laterality: N/A;  . Left heart catheterization with coronary angiogram N/A 10/28/2013    Procedure: LEFT HEART CATHETERIZATION WITH CORONARY ANGIOGRAM;  Surgeon: Burnell Blanks, MD;  Location: [facility] CATH LAB;  Service: Cardiovascular;  Laterality: N/A;    in for   Chief Complaint  Patient presents with  . Shortness of Breath  . Code STEMI     HPI 59 year old male patient with known history of coronary disease status post bypass grafting surgery May 2015, COPD with continued tobacco abuse, hypertension and dyslipidemia. Patient presented to the ER due to dyspnea. He called EMS and upon their arrival the patient was hypoxemic  with saturations of 80% and he was actively wheezing. He is placed on a non-rebreather mask. EMS gave the patient albuterol, Solu-Medrol, magnesium and he was transported to the ER.  Upon arrival to the ER patient's symptoms had begun to gradually improve. He was afebrile. He was mildly hypertensive and mildly tachycardic. He continued to have some wheezing so a continuous neb was ordered. Initially there was some concern that the patient was a code STEMI so cardiology was called and although EKG did not demonstrate ST elevation but did demonstrate inferior lateral T-wave inversions concerning for ischemia. The patient was denying chest pain. He also admitted to the cardiologist has been out of his usual medicines for 2-3 weeks. Code STEMI was canceled since troponin was negative and there was no ST elevation.  Since arrival patient has been weaned off oxygen and remained stable on room air. He reports at least a one-month history of upper respiratory type symptoms with cough and scratchy sore throat as well as primary complaint of nasal  congestion. He reports symptoms of awakening at night because of difficulty breathing for several days worse last night as well as some difficulty sleeping flat in the bed. He has taken NyQuil for his throat and cough symptoms. He has noticed some difficulty with breathing while walking.  Review of Systems   In addition to the HPI above,  No Fever-chills, myalgias or other constitutional symptoms No Headache, changes with Vision or hearing, new weakness, tingling, numbness in any extremity, No problems swallowing food or Liquids, indigestion/reflux No Chest pain No Abdominal pain, N/V; no melena or hematochezia, no dark tarry stools, Bowel movements are regular, No dysuria, hematuria or flank pain No new skin rashes, lesions, masses or bruises, No new joints pains-aches No recent weight gain or loss No polyuria, polydypsia or polyphagia,  *A full 10 point Review  of Systems was done, except as stated above, all other Review of Systems were negative.  Social History History  Substance Use Topics  . Smoking status: Current Every Day Smoker -- 0.50 packs/day for 40 years    Types: Cigarettes  . Smokeless tobacco: Never Used  . Alcohol Use: 3.6 oz/week    6 Cans of beer per week    Family History Family History  Problem Relation Age of Onset  . Lung cancer      Prior to Admission medications   Medication Sig Start Date End Date Taking? Authorizing Provider  albuterol (PROAIR HFA) 108 (90 BASE) MCG/ACT inhaler Inhale 1 puff into the lungs every 6 (six) hours as needed for wheezing or shortness of breath.   Yes Historical Provider, MD  aspirin EC 325 MG EC tablet Take 1 tablet (325 mg total) by mouth daily. 11/05/13  Yes Wayne E Gold, PA-C  atorvastatin (LIPITOR) 80 MG tablet Take 1 tablet (80 mg total) by mouth daily at 6 PM. 11/05/13  Yes Wayne E Gold, PA-C  carvedilol (COREG) 12.5 MG tablet Take 1 tablet (12.5 mg total) by mouth 2 (two) times daily with a meal. 11/05/13  Yes Wayne E Gold, PA-C  Fluticasone-Salmeterol (ADVAIR) 250-50 MCG/DOSE AEPB Inhale 1 puff into the lungs every 12 (twelve) hours.     Yes Historical Provider, MD  nicotine (NICODERM CQ - DOSED IN MG/24 HOURS) 14 mg/24hr patch Place 14 mg onto the skin daily.    Historical Provider, MD  nicotine (NICODERM CQ - DOSED IN MG/24 HR) 7 mg/24hr patch Place 7 mg onto the skin daily.    Historical Provider, MD  oxyCODONE (OXY IR/ROXICODONE) 5 MG immediate release tablet Take 1-2 tablets (5-10 mg total) by mouth every 4 (four) hours as needed for moderate pain. Patient not taking: Reported on 09/01/2014 11/05/13   John Giovanni, PA-C    No Known Allergies  Physical Exam  Vitals  Blood pressure 140/71, pulse 81, temperature 97.5 F (36.4 C), temperature source Axillary, resp. rate 15, height 6' (1.829 m), weight 180 lb (81.647 kg), SpO2 91 %.   General:  In no acute distress, appears  healthy and well nourished, drifts off to sleep quickly and begins to snore  Psych:  Normal affect, Denies Suicidal or Homicidal ideations, Awake Alert, Oriented X 3. Speech and thought patterns are clear and appropriate, no apparent short term memory deficits  Neuro:   No focal neurological deficits, CN II through XII intact, Strength 5/5 all 4 extremities, Sensation intact all 4 extremities.  ENT:  Ears and Eyes appear Normal, Conjunctivae clear, PER. Moist oral mucosa without erythema or exudates. Boggy cervical  lymph nodes palpated which are nontender  Neck:  Supple, No lymphadenopathy appreciated  Respiratory:  Symmetrical chest wall movement, Good air movement bilaterally although somewhat diminished at bases, no wheezing, coarse or sounds at bases with a few scattered crackles, Room Air  Cardiac:  RRR, No Murmurs, no LE edema noted, no JVD, No carotid bruits, peripheral pulses palpable at 2+  Abdomen:  Positive bowel sounds, Soft, Non tender, Non distended,  No masses appreciated, no obvious hepatosplenomegaly  Skin:  No Cyanosis, Normal Skin Turgor, No Skin Rash or Bruise.  Extremities: Symmetrical without obvious trauma or injury,  no effusions.  Data Review  CBC  Recent Labs Lab 09/01/14 0429  WBC 10.3  HGB 14.3  HCT 43.1  PLT 334  MCV 84.2  MCH 27.9  MCHC 33.2  RDW 17.5*  LYMPHSABS 2.4  MONOABS 0.9  EOSABS 1.0*  BASOSABS 0.0    Chemistries   Recent Labs Lab 09/01/14 0429  NA 139  K 3.9  CL 102  CO2 25  GLUCOSE 112*  BUN 8  CREATININE 1.18  CALCIUM 9.1    estimated creatinine clearance is 73.1 mL/min (by C-G formula based on Cr of 1.18).  No results for input(s): TSH, T4TOTAL, T3FREE, THYROIDAB in the last 72 hours.  Invalid input(s): FREET3  Coagulation profile  Recent Labs Lab 09/01/14 0511  INR 0.92    No results for input(s): DDIMER in the last 72 hours.  Cardiac Enzymes No results for input(s): CKMB, TROPONINI, MYOGLOBIN in the  last 168 hours.  Invalid input(s): CK  Invalid input(s): POCBNP  Urinalysis    Component Value Date/Time   COLORURINE YELLOW 10/30/2013 2113   APPEARANCEUR CLEAR 10/30/2013 2113   LABSPEC 1.024 10/30/2013 2113   PHURINE 5.5 10/30/2013 2113   GLUCOSEU NEGATIVE 10/30/2013 2113   HGBUR NEGATIVE 10/30/2013 2113   BILIRUBINUR NEGATIVE 10/30/2013 2113   [place] NEGATIVE 10/30/2013 2113   PROTEINUR NEGATIVE 10/30/2013 2113   UROBILINOGEN 1.0 10/30/2013 2113   NITRITE NEGATIVE 10/30/2013 2113   LEUKOCYTESUR NEGATIVE 10/30/2013 2113    Imaging results:   Dg Chest 2 View  09/01/2014   CLINICAL DATA:  Initial valuation for increase shortness breath. History of COPD.  EXAM: CHEST  2 VIEW  COMPARISON:  Prior radiograph from 12/10/2013  FINDINGS: Median sternotomy wires with underlying CABG markers and surgical clips again noted, stable. Cardiac and mediastinal silhouettes are unchanged, and remain within normal limits.  Lungs are hyperinflated with changes compatible with COPD. No focal infiltrate, pulmonary edema, or pleural effusion. No pneumothorax.  No acute osseus abnormality.  IMPRESSION: COPD.  No other active cardiopulmonary disease.   Electronically Signed   By: Jeannine Boga M.D.   On: 09/01/2014 06:48     EKG: Sinus rhythm with inferior lateral T-wave inversions unchanged from previous EKG March 2016, QTC 465 ms   Assessment & Plan  Principal Problem:   Acute respiratory failure/Acute bronchitis with chronic obstructive pulmonary disease (COPD) -Admit to telemetry unit -Treat his primary COPD exacerbation with bronchitis component -May also have a component of allergic rhinitis so we'll begin Flonase and Claritin -Continue duo nebs and add scheduled Solu-Medrol -Continue home metered-dose inhalers -Check influenza PCR/droplet precautions  Active Problems:   S/P CABG x 4/May 2015 -EKG stable and no changes and currently denying chest pain    HTN  (hypertension) -Patient has been out of medications for several days so could have a component of diastolic heart failure -Did not have echocardiogram prior to CABG the  LVEF on Was normal -Check 2-D echocardiogram re: concerns for possible diastolic heart failure versus right heart failure versus pulmonary hypertension -Give one-time dose Lasix IV -Resume home medications    PUD (peptic ulcer disease) -Currently asymptomatic  -Does not take prophylactic medications at home    Tobacco abuse -Continue NicoDerm patch -Provide smoking cessation counseling and discharge -Check urine drug screen; patient with history of marijuana positive in past currently denying any question    Pure hypercholesterolemia -Continue Lipitor    DVT Prophylaxis: Lovenox  Family Communication:   No family at bedside  Code Status:  Full code  Condition:  Stable  Time spent in minutes : 60   ELLIS,ALLISON L. ANP on 09/01/2014 at 7:47 AM  Between 7am to 7pm - Pager - [phone]  After 7pm go to [email or web address] - password [email or web address]  And look for the night coverage person covering me after hours  [facility]

## 2014-09-02 DIAGNOSIS — E43 Unspecified severe protein-calorie malnutrition: Secondary | ICD-10-CM | POA: Insufficient documentation

## 2014-09-02 LAB — BASIC METABOLIC PANEL
Anion gap: 7 (ref 5–15)
BUN: 15 mg/dL (ref 6–23)
CO2: 25 mmol/L (ref 19–32)
CREATININE: 1.13 mg/dL (ref 0.50–1.35)
Calcium: 9.3 mg/dL (ref 8.4–10.5)
Chloride: 106 mmol/L (ref 96–112)
GFR calc Af Amer: 80 mL/min — ABNORMAL LOW (ref >90)
GFR, EST NON AFRICAN AMERICAN: 69 mL/min — AB (ref >90)
Glucose, Bld: 135 mg/dL — ABNORMAL HIGH (ref 70–99)
Potassium: 4.6 mmol/L (ref 3.5–5.1)
Sodium: 138 mmol/L (ref 135–145)

## 2014-09-02 LAB — CBC
HCT: 40.1 % (ref 39.0–52.0)
HEMOGLOBIN: 13.6 g/dL (ref 13.0–17.0)
MCH: 28.2 pg (ref 26.0–34.0)
MCHC: 33.9 g/dL (ref 30.0–36.0)
MCV: 83 fL (ref 78.0–100.0)
Platelets: 371 10*3/uL (ref 150–400)
RBC: 4.83 MIL/uL (ref 4.22–5.81)
RDW: 17.6 % — ABNORMAL HIGH (ref 11.5–15.5)
WBC: 8.2 10*3/uL (ref 4.0–10.5)

## 2014-09-02 LAB — TROPONIN I: Troponin I: <0.03 ng/mL (ref <0.031)

## 2014-09-02 MED ORDER — HYDROCOD POLST-CHLORPHEN POLST 10-8 MG/5ML PO LQCR
5.0000 mL | Freq: Once | ORAL | Status: AC
  Administered 2014-09-02: 5 mL via ORAL
  Filled 2014-09-02: qty 5

## 2014-09-02 MED ORDER — ENSURE ENLIVE PO LIQD
237.0000 mL | ORAL | Status: DC
  Administered 2014-09-03: 237 mL via ORAL

## 2014-09-02 MED ORDER — HYDROCOD POLST-CHLORPHEN POLST 10-8 MG/5ML PO LQCR
5.0000 mL | Freq: Two times a day (BID) | ORAL | Status: DC | PRN
  Administered 2014-09-02 – 2014-09-03 (×3): 5 mL via ORAL
  Filled 2014-09-02 (×3): qty 5

## 2014-09-02 NOTE — Care Management Note (Unsigned)
    Page 1 of 1   09/02/2014     4:52:37 PM CARE MANAGEMENT NOTE 09/02/2014  Patient:  [name], [name]   Account Number:  [id number]  Date Initiated:  09/02/2014  Documentation initiated by:  Jaislyn Blinn  Subjective/Objective Assessment:   Pt adm on 09/01/14 with respiratory failure, COPD.  PTA, pt independent of ADLS.     Action/Plan:   Will follow for dc needs as pt progresses.   Anticipated DC Date:  09/05/2014   Anticipated DC Plan:  [place]  CM consult      Choice offered to / List presented to:             Status of service:  In process, will continue to follow Medicare Important Message given?   (If response is "NO", the following Medicare IM given date fields will be blank) Date Medicare IM given:   Medicare IM given by:   Date Additional Medicare IM given:   Additional Medicare IM given by:    Discharge Disposition:    Per UR Regulation:  Reviewed for med. necessity/level of care/duration of stay  If discussed at [place] of Stay Meetings, dates discussed:    Comments:

## 2014-09-02 NOTE — Progress Notes (Signed)
INITIAL NUTRITION ASSESSMENT  DOCUMENTATION CODES Per approved criteria  -Severe malnutrition in the context of acute illness or injury  Pt meets criteria for SEVERE MALNUTRITION in the context of ACUTE ILLNESS as evidenced by 10% weight loss in less than 2 months, moderate muscle wasting, and estimated energy intake </=50% of estimated energy needs for >5 days.  INTERVENTION: Provide Ensure Enlive po once daily, each supplement provides 350 kcal and 20 grams of protein Recommend pt take 60 to 120 mg of Vitamin C daily after discharge due to ongoing smoking habit   NUTRITION DIAGNOSIS: Inadequate oral intake related to poor appetite during acute illness as evidenced by 10% weight loss and moderate wasting.   Goal: Pt to meet >/= 90% of their estimated nutrition needs   Monitor:  PO intake, supplement acceptance, weight trend, labs  Reason for Assessment: Consult for assessment of nutrition status/recommendation and Positive Malnutrition Screening Tool  59 y.o. male  Admitting Dx: Acute respiratory failure  ASSESSMENT: 59 year old male patient with known history of coronary disease status post bypass grafting surgery May 2015, COPD with continued tobacco abuse, hypertension and dyslipidemia. Patient presented to the ER due to dyspnea.   Pt states that for the past 1.5 to 2 months he has had a poor appetite and eating about 50% less than usual. He usually eats 3 meals daily but, for the past 6-8 weeks has only been eating 2 meals with smaller portions at each meal. He reports losing from 200 lbs to 179 lbs during this time; he has felt weak and SOB. Per nursing notes, pt is eating 100% of meals however pt reports ongoing poor appetite. RD encouraged PO intake emphasizing the importance of protein-rich and Vitamin C rich foods. Pt agreeable to receiving Ensure Enlive once daily to provide additional 20 grams of protein daily.   Labs reviewed.    Nutrition Focused Physical  Exam:  Subcutaneous Fat:  Orbital Region: mild wasting Upper Arm Region: wnl Thoracic and Lumbar Region: mild wasting  Muscle:  Temple Region: mild wasting Clavicle Bone Region: moderate wasting Clavicle and Acromion Bone Region: mild wasting Scapular Bone Region: wnl Dorsal Hand: wnl Patellar Region: moderate wasting Anterior Thigh Region: moderate wasting Posterior Calf Region: mild wasting  Edema: none    Height: Ht Readings from Last 1 Encounters:  09/01/14 6' (1.829 m)    Weight: Wt Readings from Last 1 Encounters:  09/02/14 179 lb 11.2 oz (81.511 kg)    Ideal Body Weight: 178 lbs  % Ideal Body Weight: 100%  Wt Readings from Last 10 Encounters:  09/02/14 179 lb 11.2 oz (81.511 kg)  12/10/13 199 lb (90.266 kg)  11/11/13 199 lb (90.266 kg)  11/05/13 196 lb 13.9 oz (89.3 kg)  05/19/11 190 lb 14.4 oz (86.592 kg)    Usual Body Weight: 200 lbs  % Usual Body Weight: 90%  BMI:  Body mass index is 24.37 kg/(m^2).  Estimated Nutritional Needs: Kcal: 2000-2250 Protein: >/=120 grams/day Fluid: 2.2 L/day  Skin: intact  Diet Order: Diet Heart  EDUCATION NEEDS: -No education needs identified at this time   Intake/Output Summary (Last 24 hours) at 09/02/14 1129 Last data filed at 09/02/14 0900  Gross per 24 hour  Intake   1420 ml  Output    870 ml  Net    550 ml    Last BM: 3/20   Labs:   Recent Labs Lab 09/01/14 0429 09/02/14 0405  NA 139 138  K 3.9 4.6  CL 102 106  CO2 25 25  BUN 8 15  CREATININE 1.18 1.13  CALCIUM 9.1 9.3  GLUCOSE 112* 135*    CBG (last 3)  No results for input(s): GLUCAP in the last 72 hours.  Scheduled Meds: . aspirin EC  325 mg Oral Daily  . atorvastatin  80 mg Oral q1800  . carvedilol  12.5 mg Oral BID WC  . enoxaparin (LOVENOX) injection  40 mg Subcutaneous Q24H  . feeding supplement (ENSURE ENLIVE)  237 mL Oral BID BM  . fluticasone  2 spray Each Nare Daily  . ipratropium-albuterol  3 mL Nebulization Q4H   . levofloxacin (LEVAQUIN) IV  500 mg Intravenous Q24H  . loratadine  10 mg Oral Daily  . methylPREDNISolone (SOLU-MEDROL) injection  60 mg Intravenous Q6H  . mometasone-formoterol  2 puff Inhalation BID  . nicotine  14 mg Transdermal Daily  . oxymetazoline  1 spray Each Nare BID  . sodium chloride  3 mL Intravenous Q12H    Continuous Infusions: . sodium chloride 20 mL/hr at 09/01/14 0522    Past Medical History  Diagnosis Date  . COPD (chronic obstructive pulmonary disease)   . Perforation of duodenal ulcer     a. 1970s - s/p surgery.   . H/O: GI bleed     a. 59 y/o ago, r/t ulcer. Denies recent GIB.  . H/O: eczema     lower extremities  . High cholesterol   . Hypertension   . Cocaine abuse   . Tobacco abuse   . CAD (coronary artery disease)     4V CABG 10/31/13  . Myocardial infarction   . GERD (gastroesophageal reflux disease)     Past Surgical History  Procedure Laterality Date  . Stomach sx    . Coronary artery bypass graft N/A 10/31/2013    Procedure: CORONARY ARTERY BYPASS GRAFTING TIMES FOUR ON PUMP USING LEFT INTERNAL MAMMARY ARTERY AND RIGHT GREATER SAPHENOUS VEIN VIA ENDOVEIN HARVEST.;  Surgeon: Melrose Nakayama, MD;  Location: [place];  Service: Open Heart Surgery;  Laterality: N/A;  . Intraoperative transesophageal echocardiogram N/A 10/31/2013    Procedure: INTRAOPERATIVE TRANSESOPHAGEAL ECHOCARDIOGRAM;  Surgeon: Melrose Nakayama, MD;  Location: [place];  Service: Open Heart Surgery;  Laterality: N/A;  . Left heart catheterization with coronary angiogram N/A 10/28/2013    Procedure: LEFT HEART CATHETERIZATION WITH CORONARY ANGIOGRAM;  Surgeon: Burnell Blanks, MD;  Location: [facility] CATH LAB;  Service: Cardiovascular;  Laterality: N/A;    Pryor Ochoa RD, LDN Inpatient Clinical Dietitian Pager: [phone] After Hours Pager: [phone]

## 2014-09-02 NOTE — Progress Notes (Signed)
[facility] PROGRESS NOTE  [name] GXQ:[id number] DOB: [birth year] DOA: 09/01/2014 PCP: Elizabeth Palau, MD  Assessment/Plan: 1-Acute respiratory failure/Acute bronchitis with chronic obstructive pulmonary disease (COPD) -Treat his primary COPD exacerbation with bronchitis component, in setting cocaine use.  -Flonase and Claritin -Continue duo nebs and IV  scheduled Solu-Medrol -Continue home metered-dose inhalers - influenza PCR negative.  -BNP 226, no evidence of volume overload on Physical exam.    S/P CABG x 4/May 2015 -EKG stable and no changes and currently denying chest pain   HTN (hypertension) -Did not have echocardiogram prior to CABG the LVEF on Was normal -Check 2-D echocardiogram re: concerns for possible diastolic heart failure versus right heart failure versus pulmonary hypertension -received one dose of Lasix IV.  -Resume home medications   PUD (peptic ulcer disease) -Currently asymptomatic  -Does not take prophylactic medications at home   Tobacco abuse -Continue NicoDerm patch -UDS positive for cocaine. SW consulted for counseling.    Pure hypercholesterolemia -Continue Lipitor  Code Status: Full Code.  Family Communication: Care discussed with patient.  Disposition Plan: Remain inpatient for treatment of COPD exacerbation.    Consultants:  none  Procedures:  ECHO pending.   Antibiotics:  Levaquin 3-21  HPI/Subjective: He is feeling better but not yet at baseline. Still with dyspnea. Denies chest pain.   Objective: Filed Vitals:   09/02/14 1300  BP: 116/79  Pulse: 92  Temp: 98 F (36.7 C)  Resp: 18    Intake/Output Summary (Last 24 hours) at 09/02/14 1446 Last data filed at 09/02/14 1442  Gross per 24 hour  Intake   1637 ml  Output   1620 ml  Net     17 ml   Filed Weights   09/01/14 0425 09/01/14 1612 09/02/14 0420  Weight: 81.647 kg (180 lb) 81.693 kg (180 lb 1.6 oz) 81.511 kg (179 lb 11.2 oz)     Exam:   General:  Alert in no distress.   Cardiovascular: S 1, S 2 RRR  Respiratory: Bilateral ronchus and wheezing  Abdomen: BS present, soft, nt  Musculoskeletal: no edema.   Data Reviewed: Basic Metabolic Panel:  Recent Labs Lab 09/01/14 0429 09/02/14 0405  NA 139 138  K 3.9 4.6  CL 102 106  CO2 25 25  GLUCOSE 112* 135*  BUN 8 15  CREATININE 1.18 1.13  CALCIUM 9.1 9.3   Liver Function Tests: No results for input(s): AST, ALT, ALKPHOS, BILITOT, PROT, ALBUMIN in the last 168 hours. No results for input(s): LIPASE, AMYLASE in the last 168 hours. No results for input(s): AMMONIA in the last 168 hours. CBC:  Recent Labs Lab 09/01/14 0429 09/02/14 0405  WBC 10.3 8.2  NEUTROABS 6.0  --   HGB 14.3 13.6  HCT 43.1 40.1  MCV 84.2 83.0  PLT 334 371   Cardiac Enzymes: No results for input(s): CKTOTAL, CKMB, CKMBINDEX, TROPONINI in the last 168 hours. BNP (last 3 results)  Recent Labs  09/01/14 0745  BNP 226.5*    ProBNP (last 3 results)  Recent Labs  10/26/13 0502  PROBNP 327.4*    CBG: No results for input(s): GLUCAP in the last 168 hours.  No results found for this or any previous visit (from the past 240 hour(s)).   Studies: Dg Chest 2 View  09/01/2014   CLINICAL DATA:  Initial valuation for increase shortness breath. History of COPD.  EXAM: CHEST  2 VIEW  COMPARISON:  Prior radiograph from 12/10/2013  FINDINGS: Median sternotomy wires with  underlying CABG markers and surgical clips again noted, stable. Cardiac and mediastinal silhouettes are unchanged, and remain within normal limits.  Lungs are hyperinflated with changes compatible with COPD. No focal infiltrate, pulmonary edema, or pleural effusion. No pneumothorax.  No acute osseus abnormality.  IMPRESSION: COPD.  No other active cardiopulmonary disease.   Electronically Signed   By: Jeannine Boga M.D.   On: 09/01/2014 06:48    Scheduled Meds: . aspirin EC  325 mg Oral Daily  .  atorvastatin  80 mg Oral q1800  . carvedilol  12.5 mg Oral BID WC  . enoxaparin (LOVENOX) injection  40 mg Subcutaneous Q24H  . [START ON 09/03/2014] feeding supplement (ENSURE ENLIVE)  237 mL Oral Q24H  . fluticasone  2 spray Each Nare Daily  . ipratropium-albuterol  3 mL Nebulization Q4H  . levofloxacin (LEVAQUIN) IV  500 mg Intravenous Q24H  . loratadine  10 mg Oral Daily  . methylPREDNISolone (SOLU-MEDROL) injection  60 mg Intravenous Q6H  . mometasone-formoterol  2 puff Inhalation BID  . nicotine  14 mg Transdermal Daily  . oxymetazoline  1 spray Each Nare BID  . sodium chloride  3 mL Intravenous Q12H   Continuous Infusions: . sodium chloride 20 mL/hr at 09/01/14 0522    Principal Problem:   Acute respiratory failure Active Problems:   Acute bronchitis with chronic obstructive pulmonary disease (COPD)   PUD (peptic ulcer disease)   Tobacco abuse   Pure hypercholesterolemia   S/P CABG x 4   HTN (hypertension)   Protein-calorie malnutrition, severe    Time spent: 35 minutes.     Niel Hummer A  [facility] Pager [phone]. If 7PM-7AM, please contact night-coverage at [email or web address], password [email or web address] 09/02/2014, 2:46 PM  LOS: 1 day

## 2014-09-03 DIAGNOSIS — Z720 Tobacco use: Secondary | ICD-10-CM

## 2014-09-03 DIAGNOSIS — I509 Heart failure, unspecified: Secondary | ICD-10-CM

## 2014-09-03 LAB — BASIC METABOLIC PANEL
Anion gap: 7 (ref 5–15)
BUN: 23 mg/dL (ref 6–23)
CO2: 26 mmol/L (ref 19–32)
Calcium: 8.6 mg/dL (ref 8.4–10.5)
Chloride: 102 mmol/L (ref 96–112)
Creatinine, Ser: 1.23 mg/dL (ref 0.50–1.35)
GFR calc Af Amer: 72 mL/min — ABNORMAL LOW (ref >90)
GFR calc non Af Amer: 62 mL/min — ABNORMAL LOW (ref >90)
Glucose, Bld: 140 mg/dL — ABNORMAL HIGH (ref 70–99)
Potassium: 4.8 mmol/L (ref 3.5–5.1)
Sodium: 135 mmol/L (ref 135–145)

## 2014-09-03 LAB — TROPONIN I: Troponin I: <0.03 ng/mL (ref <0.031)

## 2014-09-03 MED ORDER — IPRATROPIUM-ALBUTEROL 0.5-2.5 (3) MG/3ML IN SOLN
3.0000 mL | Freq: Four times a day (QID) | RESPIRATORY_TRACT | Status: DC
  Administered 2014-09-04 (×2): 3 mL via RESPIRATORY_TRACT
  Filled 2014-09-03 (×3): qty 3

## 2014-09-03 MED ORDER — PREDNISONE 50 MG PO TABS
50.0000 mg | ORAL_TABLET | Freq: Every day | ORAL | Status: DC
  Filled 2014-09-03: qty 1

## 2014-09-03 MED ORDER — LEVOFLOXACIN 500 MG PO TABS
500.0000 mg | ORAL_TABLET | Freq: Every day | ORAL | Status: DC
  Administered 2014-09-04: 500 mg via ORAL
  Filled 2014-09-03: qty 1

## 2014-09-03 MED ORDER — PREDNISONE 50 MG PO TABS
50.0000 mg | ORAL_TABLET | Freq: Every day | ORAL | Status: DC
  Administered 2014-09-04: 50 mg via ORAL
  Filled 2014-09-03 (×2): qty 1

## 2014-09-03 NOTE — Progress Notes (Signed)
  Echocardiogram 2D Echocardiogram has been performed.  [name] [name] 09/03/2014, 7:54 AM

## 2014-09-03 NOTE — Progress Notes (Signed)
[facility] PROGRESS NOTE  [name] WNU:[id number] DOB: Feb 27, 1956 DOA: 09/01/2014 PCP: Elizabeth Palau, MD  Brief Summary  59 year old male patient with known history of coronary disease status post bypass grafting surgery May 2015, COPD with continued tobacco abuse, hypertension and dyslipidemia. Patient presented to the ER due to dyspnea. He called EMS and upon their arrival the patient was hypoxemic with saturations of 80% and he was actively wheezing. He is placed on a non-rebreather mask. EMS gave the patient albuterol, Solu-Medrol, magnesium and he was transported to the ER.  Upon arrival to the ER patient's symptoms had begun to gradually improve. He was afebrile. He was mildly hypertensive and mildly tachycardic. He continued to have some wheezing so a continuous neb was ordered. Initially there was some concern that the patient was a code STEMI so cardiology was called and although EKG did not demonstrate ST elevation but did demonstrate inferior lateral T-wave inversions concerning for ischemia. The patient was denying chest pain. He also admitted to the cardiologist has been out of his usual medicines for 2-3 weeks. Code STEMI was canceled since troponin was negative and there was no ST elevation.  Since arrival patient has been weaned off oxygen and remained stable on room air. He reports at least a one-month history of upper respiratory type symptoms with cough and scratchy sore throat as well as primary complaint of nasal congestion. He reports symptoms of awakening at night because of difficulty breathing for several days worse last night as well as some difficulty sleeping flat in the bed. He has taken NyQuil for his throat and cough symptoms. He has noticed some difficulty with breathing while walking.  Assessment/Plan  Acute respiratory failure/Acute bronchitis with chronic obstructive pulmonary disease (COPD) - Tele:  NSR -  Okay to d/c telemetry - d/c IV  solumedrol -  Start oral prednisone on 3/24 - continue Flonase and Claritin - Continue duo nebs - Continue dulera - influenza PCR:  Neg - change to oral levofloxacin  S/P CABG x 4/May 2015 -EKG stable and no changes and currently denying chest pain - continue ASA, BB, high dose statin  HTN (hypertension) - Patient has been out of medications for a month so could have a component of diastolic heart failure - ECHO:  45-50% with hypokinesis of the inferolateral myocardium, grade 1 DD -Give one-time dose Lasix IV -Resume home medications   PUD (peptic ulcer disease) -Currently asymptomatic  -Does not take prophylactic medications at home   Tobacco abuse -Continue NicoDerm patch -Provide smoking cessation counseling and discharge -Cocaine positive UDS   Pure hypercholesterolemia -Continue Lipitor  Diet:  Healthy heart Access:  PIV IVF:  off Proph:  lovenox  Code Status: full Family Communication: patient alone Disposition Plan: likely home tomorrow if tolerating transition to oral steroids   Consultants:  none  Procedures:  ECHO pending.  Antibiotics:  Levaquin 3-21  HPI/Subjective:  Feeling better.  Walked up and down the hall today on RA  Objective: Filed Vitals:   09/03/14 0940 09/03/14 1157 09/03/14 1506 09/03/14 1557  BP: 128/81  121/70   Pulse: 103  92   Temp:   98.6 F (37 C)   TempSrc:   Oral   Resp:   16   Height:      Weight:      SpO2:  97% 95% 93%    Intake/Output Summary (Last 24 hours) at 09/03/14 1712 Last data filed at 09/03/14 1507  Gross per 24 hour  Intake  1400 ml  Output   1700 ml  Net   -300 ml   Filed Weights   09/01/14 1612 09/02/14 0420 09/03/14 0649  Weight: 81.693 kg (180 lb 1.6 oz) 81.511 kg (179 lb 11.2 oz) 81.784 kg (180 lb 4.8 oz)    Exam:   General:  Adult male, No acute distress  HEENT:  NCAT, MMM  Cardiovascular:  RRR, nl S1, S2 no mrg, 2+ pulses, warm extremities  Respiratory:  Diminished  bilateral BS, no obvious wheezes, rales, or rhonchi, no increased WOB  Abdomen:   NABS, soft, NT/ND  MSK:   Normal tone and bulk, no LEE  Neuro:  Grossly intact  Data Reviewed: Basic Metabolic Panel:  Recent Labs Lab 09/01/14 0429 09/02/14 0405 09/03/14 0316  NA 139 138 135  K 3.9 4.6 4.8  CL 102 106 102  CO2 25 25 26   GLUCOSE 112* 135* 140*  BUN 8 15 23   CREATININE 1.18 1.13 1.23  CALCIUM 9.1 9.3 8.6   Liver Function Tests: No results for input(s): AST, ALT, ALKPHOS, BILITOT, PROT, ALBUMIN in the last 168 hours. No results for input(s): LIPASE, AMYLASE in the last 168 hours. No results for input(s): AMMONIA in the last 168 hours. CBC:  Recent Labs Lab 09/01/14 0429 09/02/14 0405  WBC 10.3 8.2  NEUTROABS 6.0  --   HGB 14.3 13.6  HCT 43.1 40.1  MCV 84.2 83.0  PLT 334 371   Cardiac Enzymes:  Recent Labs Lab 09/02/14 1730 09/02/14 2046 09/03/14 0316  TROPONINI <0.03 <0.03 <0.03   BNP (last 3 results)  Recent Labs  09/01/14 0745  BNP 226.5*    ProBNP (last 3 results)  Recent Labs  10/26/13 0502  PROBNP 327.4*    CBG: No results for input(s): GLUCAP in the last 168 hours.  No results found for this or any previous visit (from the past 240 hour(s)).   Studies: No results found.  Scheduled Meds: . aspirin EC  325 mg Oral Daily  . atorvastatin  80 mg Oral q1800  . carvedilol  12.5 mg Oral BID WC  . enoxaparin (LOVENOX) injection  40 mg Subcutaneous Q24H  . feeding supplement (ENSURE ENLIVE)  237 mL Oral Q24H  . fluticasone  2 spray Each Nare Daily  . ipratropium-albuterol  3 mL Nebulization Q4H  . levofloxacin (LEVAQUIN) IV  500 mg Intravenous Q24H  . loratadine  10 mg Oral Daily  . methylPREDNISolone (SOLU-MEDROL) injection  60 mg Intravenous Q6H  . mometasone-formoterol  2 puff Inhalation BID  . nicotine  14 mg Transdermal Daily  . oxymetazoline  1 spray Each Nare BID  . sodium chloride  3 mL Intravenous Q12H   Continuous  Infusions: . sodium chloride 20 mL/hr at 09/01/14 0522    Principal Problem:   Acute respiratory failure Active Problems:   Acute bronchitis with chronic obstructive pulmonary disease (COPD)   PUD (peptic ulcer disease)   Tobacco abuse   Pure hypercholesterolemia   S/P CABG x 4   HTN (hypertension)   Protein-calorie malnutrition, severe    Time spent: 30 min    Ryson Bacha, [facility] Pager [phone]. If 7PM-7AM, please contact night-coverage at [email or web address], password [facility] 09/03/2014, 5:12 PM  LOS: 2 days

## 2014-09-04 DIAGNOSIS — I255 Ischemic cardiomyopathy: Secondary | ICD-10-CM

## 2014-09-04 MED ORDER — NICOTINE 14 MG/24HR TD PT24: 14.0000 mg | MEDICATED_PATCH | Freq: Every day | TRANSDERMAL | Status: DC

## 2014-09-04 MED ORDER — FLUTICASONE PROPIONATE 50 MCG/ACT NA SUSP: 2.0000 | Freq: Every day | NASAL | Status: DC

## 2014-09-04 MED ORDER — IPRATROPIUM-ALBUTEROL 20-100 MCG/ACT IN AERS: 1.0000 | INHALATION_SPRAY | Freq: Four times a day (QID) | RESPIRATORY_TRACT | Status: DC | PRN

## 2014-09-04 MED ORDER — MOMETASONE FURO-FORMOTEROL FUM 100-5 MCG/ACT IN AERO: 2.0000 | INHALATION_SPRAY | Freq: Two times a day (BID) | RESPIRATORY_TRACT | Status: DC

## 2014-09-04 MED ORDER — LEVOFLOXACIN 500 MG PO TABS: 500.0000 mg | ORAL_TABLET | Freq: Every day | ORAL | Status: DC

## 2014-09-04 MED ORDER — PREDNISONE 20 MG PO TABS: ORAL_TABLET | ORAL | Status: DC

## 2014-09-04 MED ORDER — NICOTINE 7 MG/24HR TD PT24: 7.0000 mg | MEDICATED_PATCH | Freq: Every day | TRANSDERMAL | Status: DC

## 2014-09-04 NOTE — Progress Notes (Signed)
1700 discharge instructions given , Verbalized understanding . Wheeled to lobby by Nt

## 2014-09-04 NOTE — Discharge Summary (Signed)
Physician Discharge Summary  [name] ATF:[id number] DOB: 02/20/1956 DOA: 09/01/2014  PCP: Elizabeth Palau, MD  Admit date: 09/01/2014 Discharge date: 09/04/2014  Recommendations for Outpatient Follow-up:  1. Follow up with PCP.  Referral for PFTs once clinically improved and off steroids.   2. Continue smoking cessation counseling and drug abstinence counseling  Discharge Diagnoses:  Principal Problem:   Acute respiratory failure Active Problems:   Acute bronchitis with chronic obstructive pulmonary disease (COPD)   PUD (peptic ulcer disease)   Tobacco abuse   Pure hypercholesterolemia   S/P CABG x 4   HTN (hypertension)   Protein-calorie malnutrition, severe   Discharge Condition: stable, improved  Diet recommendation: healthy heart  Wt Readings from Last 3 Encounters:  09/04/14 83.099 kg (183 lb 3.2 oz)  12/10/13 90.266 kg (199 lb)  11/11/13 90.266 kg (199 lb)    History of present illness:   59 year old male patient with known history of coronary disease status post bypass grafting surgery May 2015, COPD with continued tobacco abuse, hypertension and dyslipidemia. Patient presented to the ER due to dyspnea. He called EMS and upon their arrival the patient was hypoxemic with saturations of 80% and he was actively wheezing. He is placed on a non-rebreather mask. EMS gave the patient albuterol, Solu-Medrol, magnesium and he was transported to the ER.  Upon arrival to the ER patient's symptoms began to gradually improve. He was afebrile. He was mildly hypertensive and mildly tachycardic. He continued to have some wheezing so a continuous neb was ordered. Initially there was some concern that the patient was a code STEMI so cardiology was called and although EKG did not demonstrate ST elevation but did demonstrate inferior lateral T-wave inversions concerning for ischemia. The patient was denying chest pain. He also admitted to the cardiologist has been out of his usual  medicines for 2-3 weeks. Code STEMI was canceled since troponin was negative and there was no ST elevation.  Since arrival patient has been weaned off oxygen and remained stable on room air. He reports at least a one-month history of upper respiratory type symptoms with cough and scratchy sore throat as well as primary complaint of nasal congestion. He reports symptoms of awakening at night because of difficulty breathing for several days worse last night as well as some difficulty sleeping flat in the bed. He has taken NyQuil for his throat and cough symptoms. He has noticed some difficulty with breathing while walking.  Hospital Course:   Acute respiratory failure/Acute bronchitis with chronic obstructive pulmonary disease (COPD) - Tele: NSR - transitioned from IV solumedrol to prednisone on 3/24 - continued Flonase  - given duo nebs and will give Rx for combivent inhaler for home - patient would like to try dulera, prescription provided - influenza PCR: Neg - changed to oral levofloxacin to complete a 5 day course, last dose on 3/25  S/P CABG x 4/May 2015 -  EKG stable and no changes and denied chest pain - continued ASA, BB, high dose statin  HTN (hypertension)  BP mildly elevated - Patient has been out of medications for a month so could have a component of diastolic heart failure - ECHO: 45-50% with hypokinesis of the inferolateral myocardium, grade 1 DD -Given one-time dose Lasix IV -Resumed home medications   PUD (peptic ulcer disease) -Currently asymptomatic  -Does not take prophylactic medications at home   Tobacco abuse - Given NicoDerm patch and a prescription for home for 14mg  patch x 1 week, then 7mg  x  1 week, then stop - Cocaine positive UDS, counseled cessation   Pure hypercholesterolemia -Continue Lipitor  Consultants:  none  Procedures:  ECHO.  Antibiotics:  Levaquin 3-21  Discharge Exam: Filed Vitals:   09/04/14 0843  BP: 140/97  Pulse: 77   Temp: 97.5 F (36.4 C)  Resp:    Filed Vitals:   09/03/14 2036 09/04/14 0128 09/04/14 0445 09/04/14 0843  BP:   147/69 140/97  Pulse:   84 77  Temp:   97.4 F (36.3 C) 97.5 F (36.4 C)  TempSrc:   Oral Oral  Resp:   18   Height:      Weight:   83.099 kg (183 lb 3.2 oz)   SpO2: 96% 96% 97% 99%     General: Adult male, No acute distress, walking up and down halls, able to speak in full sentences  HEENT: NCAT, MMM  Cardiovascular: RRR, nl S1, S2 no mrg, 2+ pulses, warm extremities  Respiratory: Diminished bilateral BS, no obvious wheezes, rales, or rhonchi, no increased WOB  Abdomen: NABS, soft, NT/ND  MSK: Normal tone and bulk, no LEE  Neuro: Grossly intact  Discharge Instructions      Discharge Instructions    Call MD for:  difficulty breathing, headache or visual disturbances    Complete by:  As directed      Call MD for:  extreme fatigue    Complete by:  As directed      Call MD for:  hives    Complete by:  As directed      Call MD for:  persistant dizziness or light-headedness    Complete by:  As directed      Call MD for:  persistant nausea and vomiting    Complete by:  As directed      Call MD for:  severe uncontrolled pain    Complete by:  As directed      Call MD for:  temperature >100.4    Complete by:  As directed      Diet - low sodium heart healthy    Complete by:  As directed      Discharge instructions    Complete by:  As directed   You were hospitalized with a COPD exacerbation.  Please stop smoking and using cocaine.  Please take dulera every day to prevent wheezing.  Use combivent inhaler as needed for shortness of breath.  Continue levofloxacin on 3/25, then stop.  Please take prednisone in decreasing doses for the next few days.  Your next dose is due on 3/25.  Talk to your primary care doctor about lung function testing.  You need to be seen again in 1 week by your primary care doctor.     Increase activity slowly    Complete by:   As directed             Medication List    STOP taking these medications        Fluticasone-Salmeterol 250-50 MCG/DOSE Aepb  Commonly known as:  ADVAIR  Replaced by:  mometasone-formoterol 100-5 MCG/ACT Aero     oxyCODONE 5 MG immediate release tablet  Commonly known as:  Oxy IR/ROXICODONE     PROAIR HFA 108 (90 BASE) MCG/ACT inhaler  Generic drug:  albuterol      TAKE these medications        aspirin 325 MG EC tablet  Take 1 tablet (325 mg total) by mouth daily.     atorvastatin 80 MG tablet  Commonly known as:  LIPITOR  Take 1 tablet (80 mg total) by mouth daily at 6 PM.     carvedilol 12.5 MG tablet  Commonly known as:  COREG  Take 1 tablet (12.5 mg total) by mouth 2 (two) times daily with a meal.     fluticasone 50 MCG/ACT nasal spray  Commonly known as:  FLONASE  Place 2 sprays into both nostrils daily.     Ipratropium-Albuterol 20-100 MCG/ACT Aers respimat  Commonly known as:  COMBIVENT  Inhale 1 puff into the lungs every 6 (six) hours as needed for wheezing or shortness of breath.     levofloxacin 500 MG tablet  Commonly known as:  LEVAQUIN  Take 1 tablet (500 mg total) by mouth daily.     mometasone-formoterol 100-5 MCG/ACT Aero  Commonly known as:  DULERA  Inhale 2 puffs into the lungs 2 (two) times daily.     nicotine 14 mg/24hr patch  Commonly known as:  NICODERM CQ - dosed in mg/24 hours  Place 1 patch (14 mg total) onto the skin daily.     nicotine 7 mg/24hr patch  Commonly known as:  NICODERM CQ - dosed in mg/24 hr  Place 1 patch (7 mg total) onto the skin daily.     predniSONE 20 MG tablet  Commonly known as:  DELTASONE  Take 2 tabs daily for two days, 1 tab daily for two days, half tab daily for 2 days, then stop       Follow-up Information    Follow up with Elizabeth Palau, MD. Schedule an appointment as soon as possible for a visit in 1 week.   Specialty:  Family Medicine   Contact information:   [place]  [phone]        The results of significant diagnostics from this hospitalization (including imaging, microbiology, ancillary and laboratory) are listed below for reference.    Significant Diagnostic Studies: Dg Chest 2 View  09/01/2014   CLINICAL DATA:  Initial valuation for increase shortness breath. History of COPD.  EXAM: CHEST  2 VIEW  COMPARISON:  Prior radiograph from 12/10/2013  FINDINGS: Median sternotomy wires with underlying CABG markers and surgical clips again noted, stable. Cardiac and mediastinal silhouettes are unchanged, and remain within normal limits.  Lungs are hyperinflated with changes compatible with COPD. No focal infiltrate, pulmonary edema, or pleural effusion. No pneumothorax.  No acute osseus abnormality.  IMPRESSION: COPD.  No other active cardiopulmonary disease.   Electronically Signed   By: Jeannine Boga M.D.   On: 09/01/2014 06:48    Microbiology: No results found for this or any previous visit (from the past 240 hour(s)).   Labs: Basic Metabolic Panel:  Recent Labs Lab 09/01/14 0429 09/02/14 0405 09/03/14 0316  NA 139 138 135  K 3.9 4.6 4.8  CL 102 106 102  CO2 25 25 26   GLUCOSE 112* 135* 140*  BUN 8 15 23   CREATININE 1.18 1.13 1.23  CALCIUM 9.1 9.3 8.6   Liver Function Tests: No results for input(s): AST, ALT, ALKPHOS, BILITOT, PROT, ALBUMIN in the last 168 hours. No results for input(s): LIPASE, AMYLASE in the last 168 hours. No results for input(s): AMMONIA in the last 168 hours. CBC:  Recent Labs Lab 09/01/14 0429 09/02/14 0405  WBC 10.3 8.2  NEUTROABS 6.0  --   HGB 14.3 13.6  HCT 43.1 40.1  MCV 84.2 83.0  PLT 334 371   Cardiac Enzymes:  Recent Labs Lab 09/02/14 1730 09/02/14 2046 09/03/14 0316  TROPONINI <0.03 <0.03 <0.03   BNP: BNP (last 3 results)  Recent Labs  09/01/14 0745  BNP 226.5*    ProBNP (last 3 results)  Recent Labs  10/26/13 0502  PROBNP 327.4*     CBG: No results for input(s): GLUCAP in the last 168 hours.  Time coordinating discharge: 35 minutes  Signed:  Avaiah Stempel  [facility] 09/04/2014, 1:17 PM

## 2015-03-12 IMAGING — CR DG CHEST 1V PORT
2 series · 2 of 2 positions shown · non-contrast
Comparison: Portable exam 6842 hr compared to 10/31/2013

CLINICAL DATA: Post CABG

EXAM:
PORTABLE CHEST - 1 VIEW

[portable (1 of 2)]
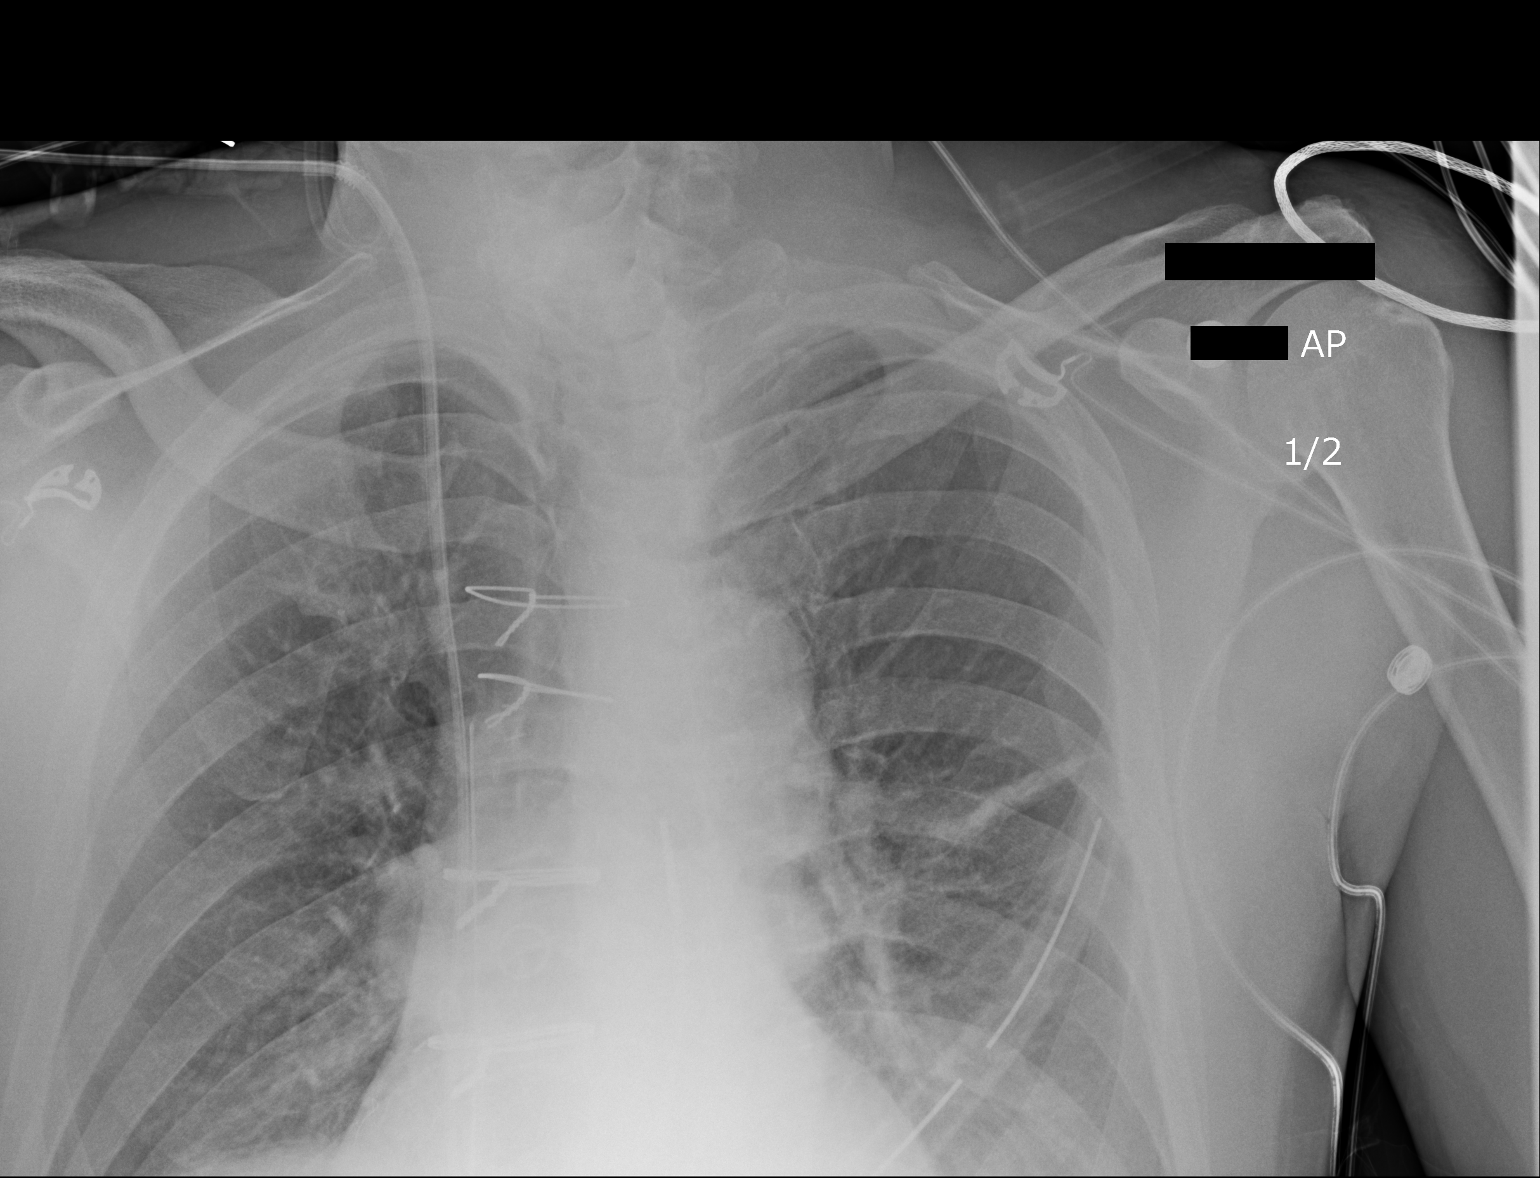

[portable (2 of 2)]
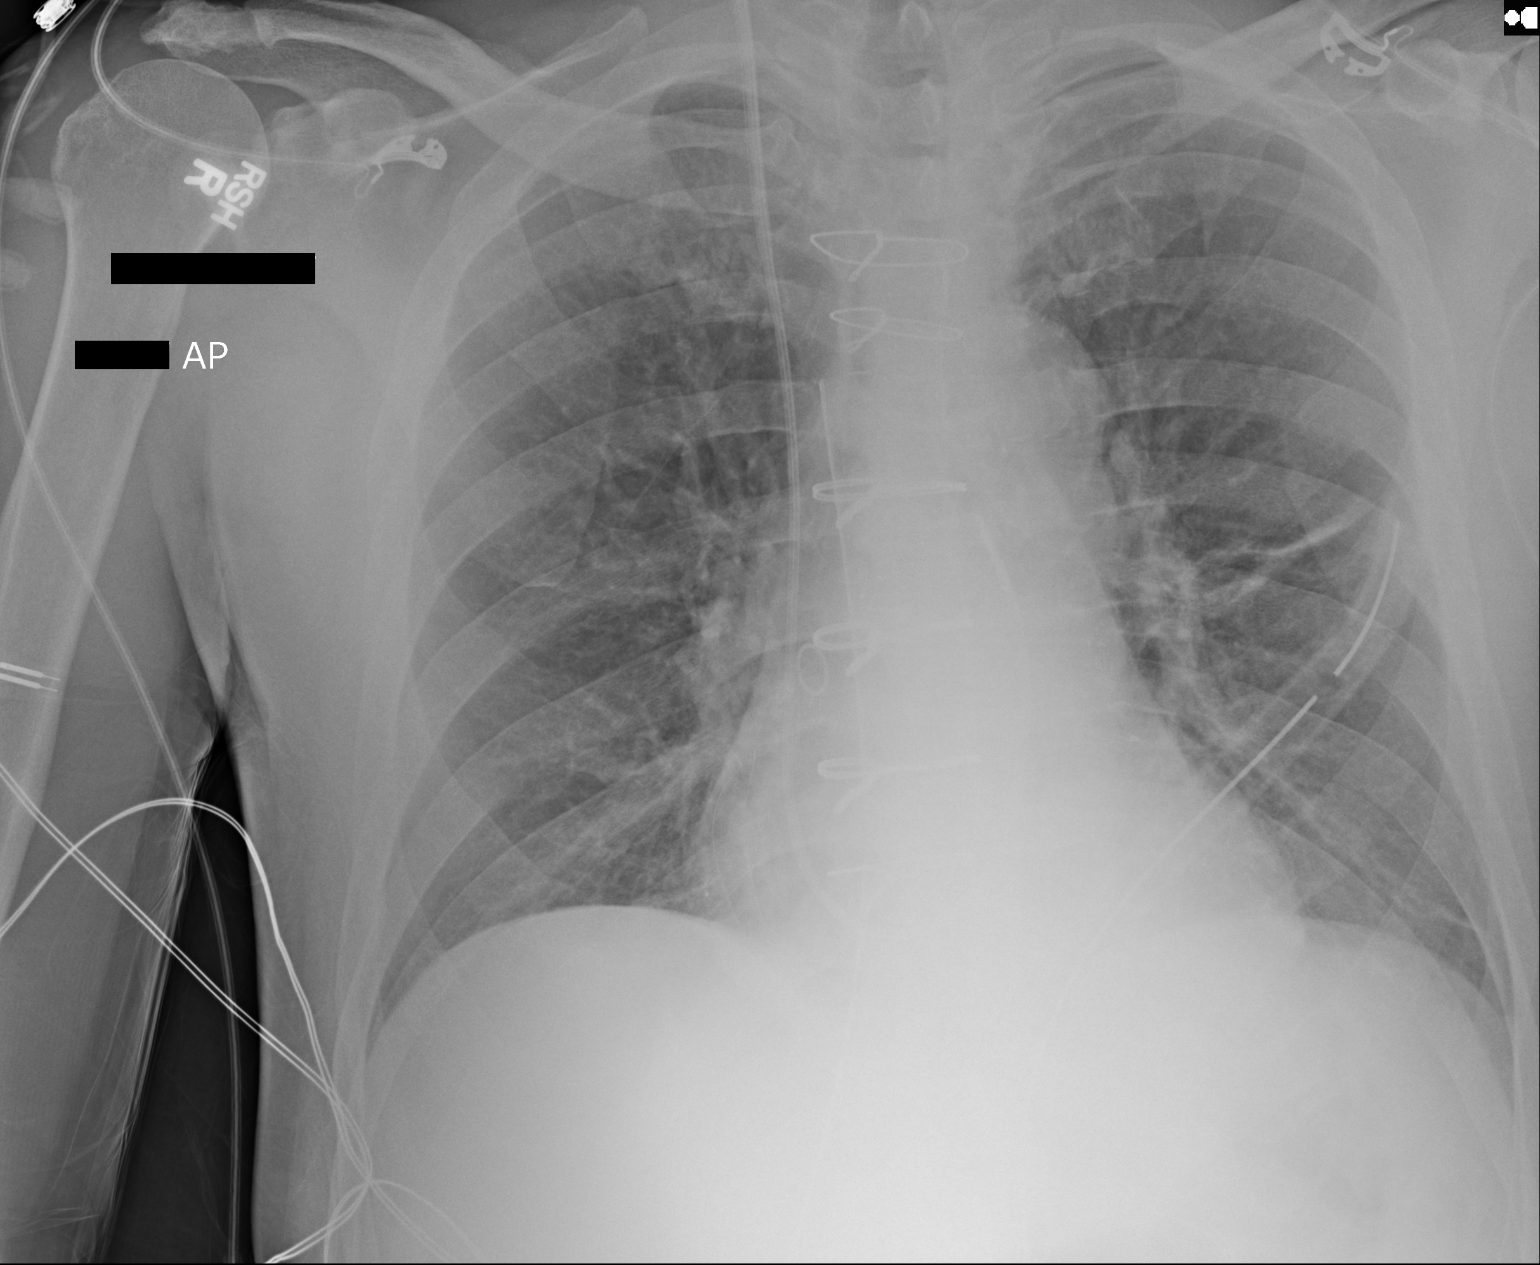

[2 of 2 positions shown; findings below may reference images not displayed]

FINDINGS: Interval removal of endotracheal and nasogastric tubes.

Mediastinal drain and LEFT thoracostomy tube remain.

RIGHT jugular Swan-Ganz catheter tip projects over main pulmonary
artery bifurcation.

Normal heart size post CABG.

Slight pulmonary vascular congestion.

Minimal atelectasis at mid to lower LEFT lung.

No acute infiltrate, pleural effusion, or pneumothorax.
IMPRESSION: Mild atelectasis lower LEFT lung.

## 2015-03-13 IMAGING — CR DG CHEST 1V PORT
1 series · 1 of 1 positions shown · non-contrast
Comparison: 11/01/2013

CLINICAL DATA: Postop cardiac surgery

EXAM:
PORTABLE CHEST - 1 VIEW

[AP]
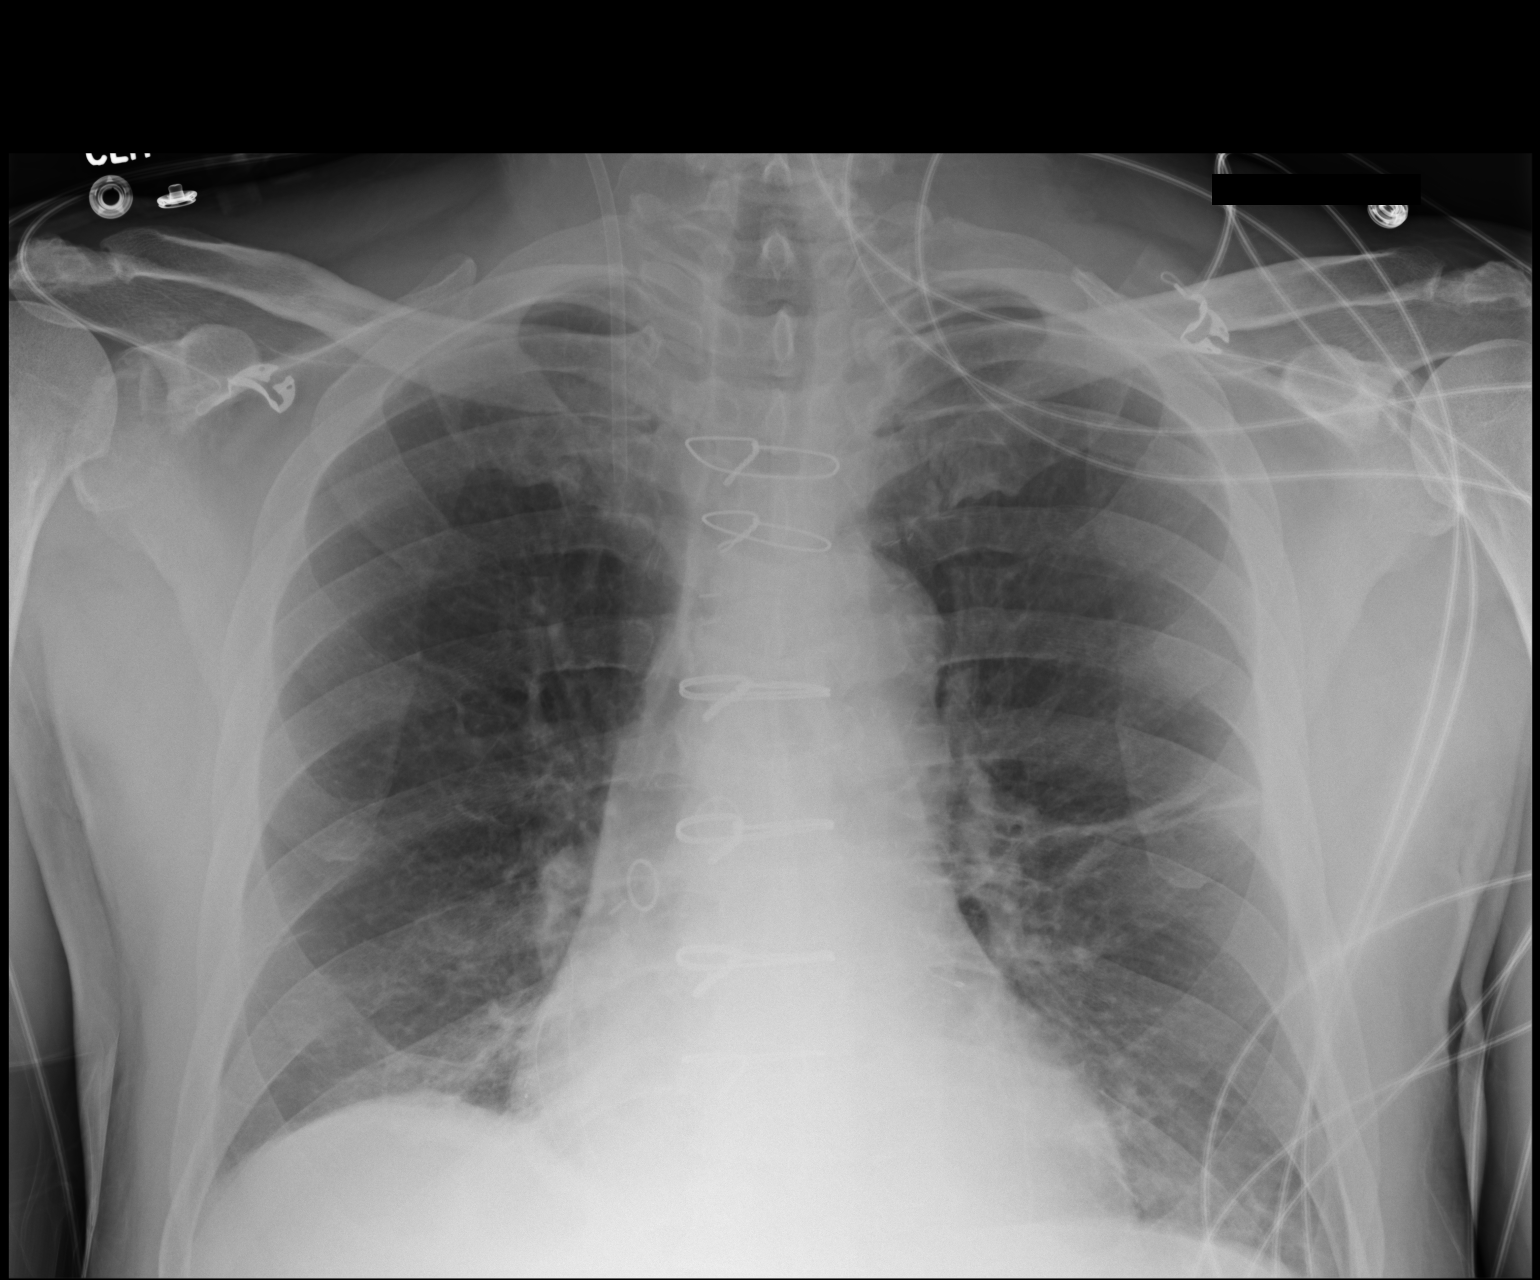

[1 of 1 positions shown; findings below may reference images not displayed]

FINDINGS: Heart size appears within normal limits and is stable. Pulmonary
vascularity normal. There are changes of median sternotomy for CABG.
There is mild left hilar and bibasilar atelectasis. Negative for
pulmonary edema. No visible pneumothorax. Right IJ sheath terminates
in the proximal superior vena cava.
IMPRESSION: Scattered areas of atelectasis.

## 2015-03-14 IMAGING — CR DG CHEST 2V
2 series · 2 of 2 positions shown · non-contrast
Comparison: 11/02/2013 and 11/01/2013.

CLINICAL DATA: Atelectasis.

EXAM:
CHEST  2 VIEW

[w chest pa]
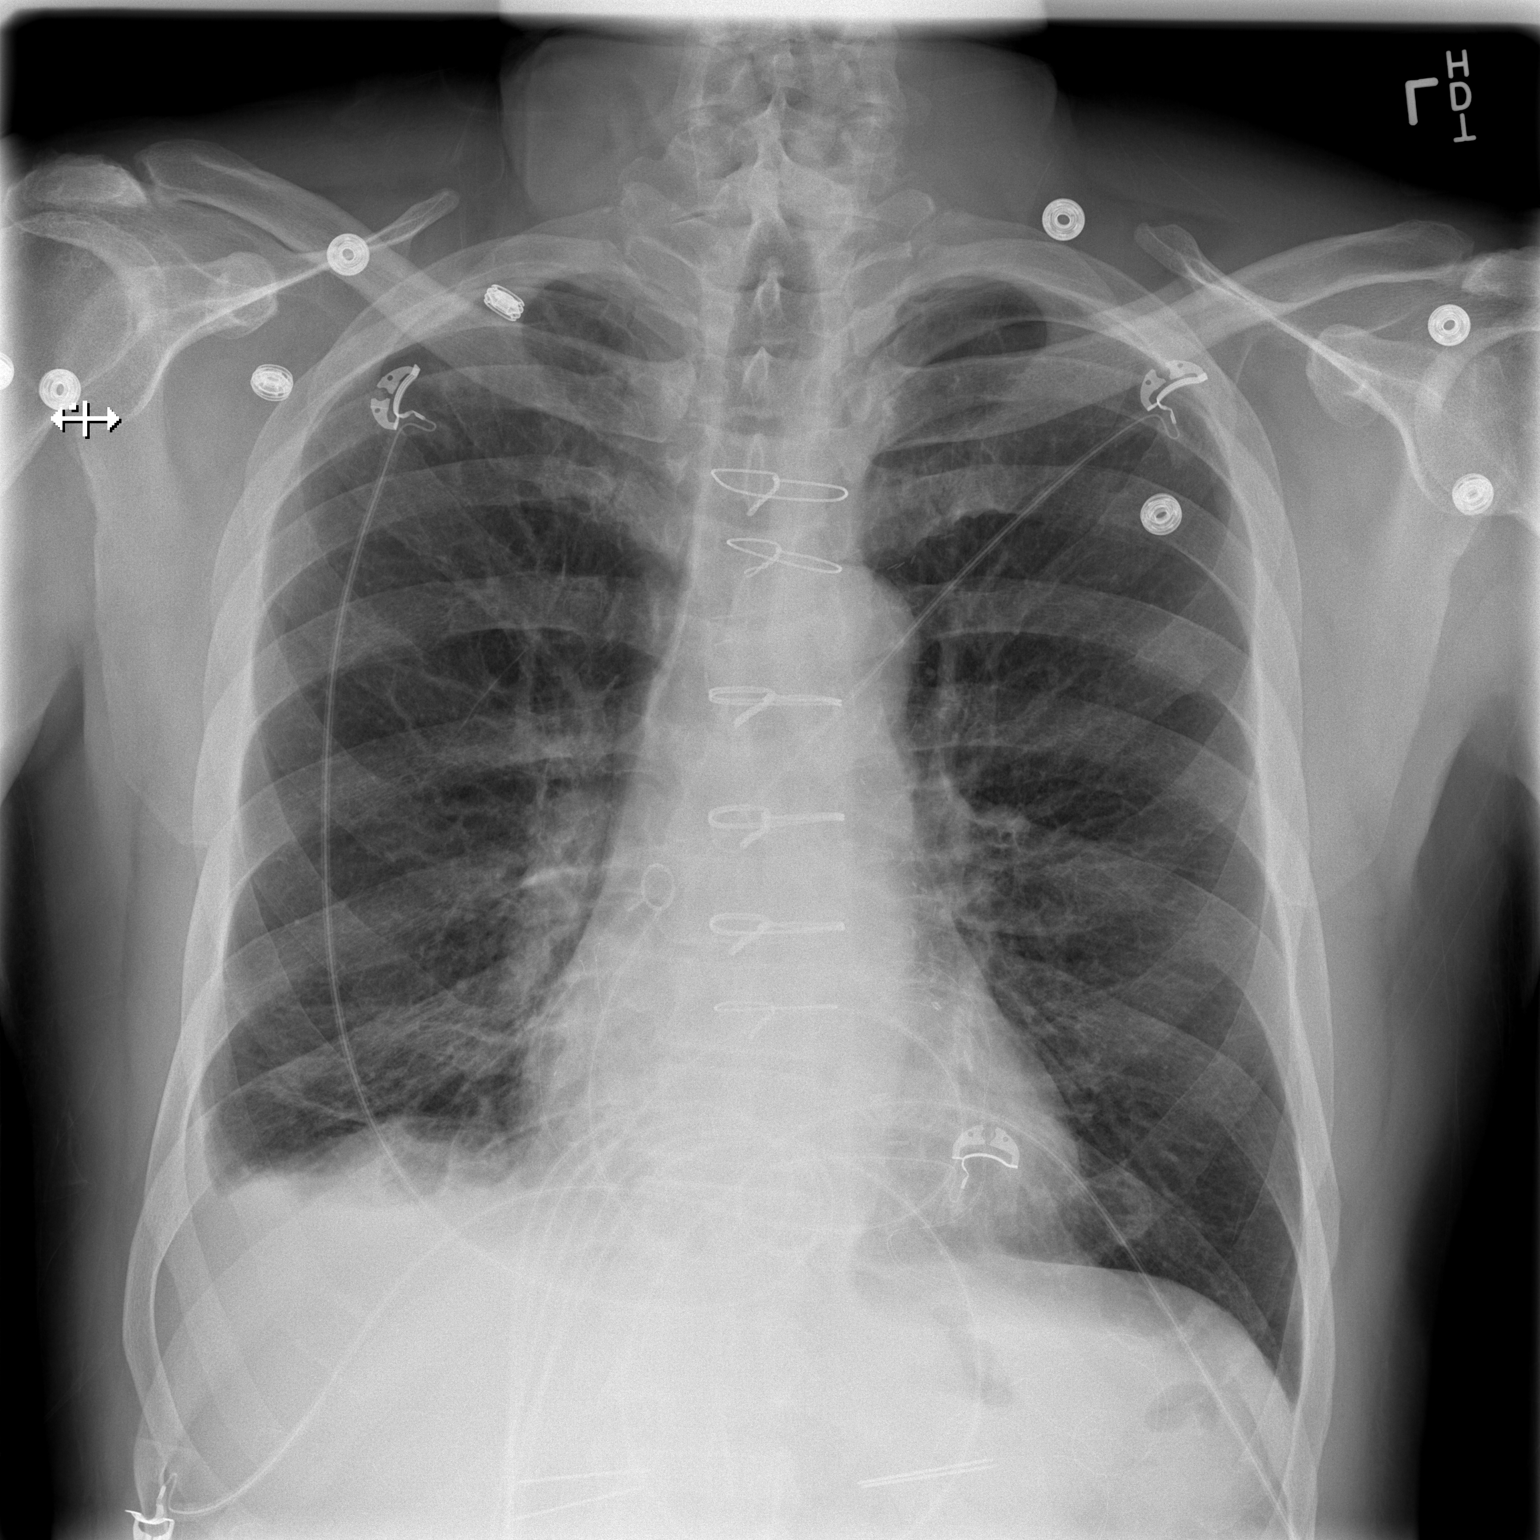

[w chest lat]
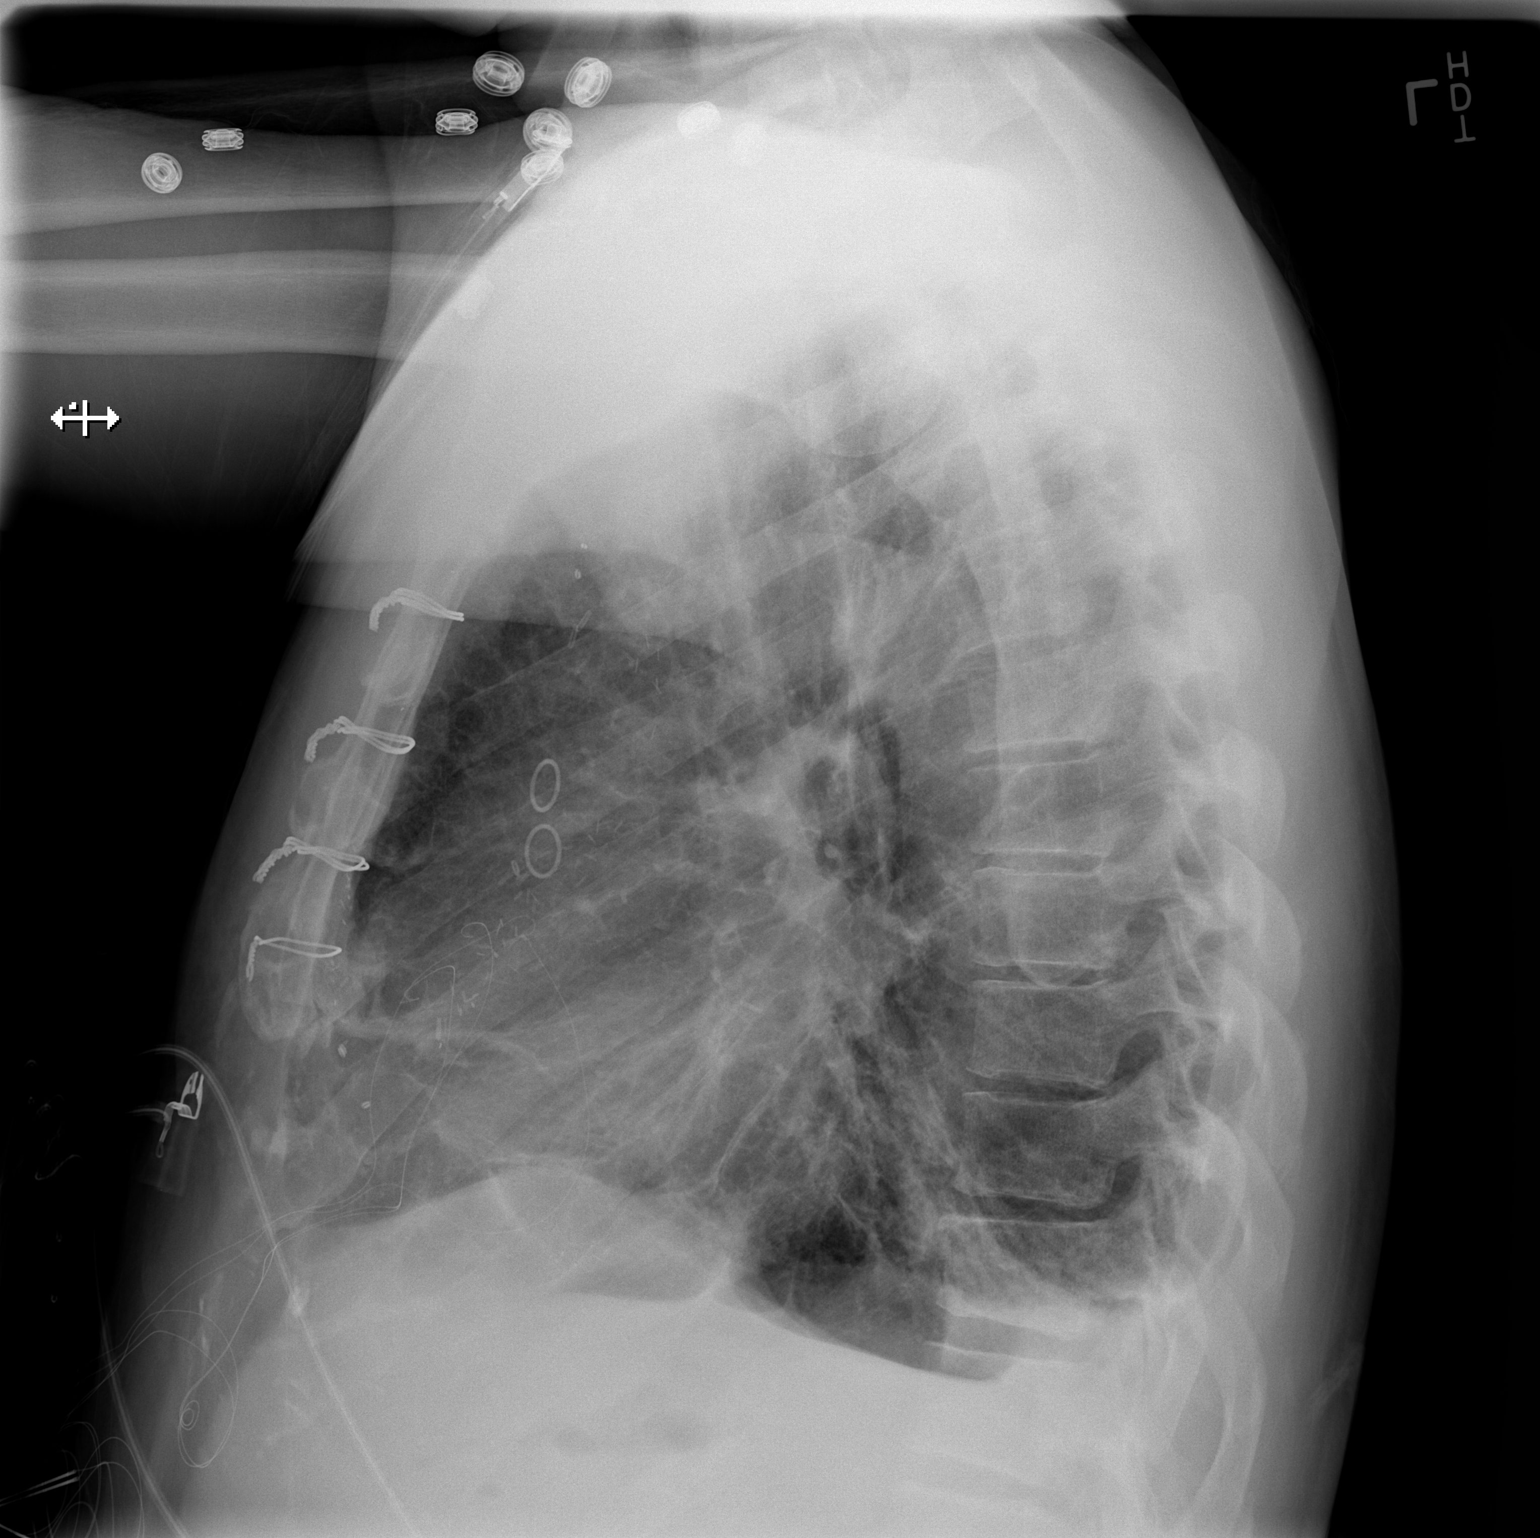

[2 of 2 positions shown; findings below may reference images not displayed]

FINDINGS: The heart size and mediastinal contours are stable status post
recent median sternotomy and CABG. Right IJ sheath has been removed
in the interval. There is improved aeration of the lung bases with
mild residual bibasilar atelectasis and small right greater than
left pleural effusions. There are minimal biapical pneumothoraces.
There is also small amount of epicardial air on the lateral view
adjacent to the epicardial leads.
IMPRESSION: Improving aeration of both lung bases with mild bibasilar
atelectasis and small bilateral hydro pneumothoraces.

## 2015-07-02 ENCOUNTER — Inpatient Hospital Stay (HOSPITAL_COMMUNITY): Payer: Self-pay

## 2015-07-02 ENCOUNTER — Emergency Department (HOSPITAL_COMMUNITY): Payer: Self-pay

## 2015-07-02 ENCOUNTER — Inpatient Hospital Stay (HOSPITAL_COMMUNITY): Payer: MEDICAID

## 2015-07-02 ENCOUNTER — Encounter (HOSPITAL_COMMUNITY): Payer: Self-pay | Admitting: Emergency Medicine

## 2015-07-02 ENCOUNTER — Inpatient Hospital Stay (HOSPITAL_COMMUNITY)
Admission: EM | Admit: 2015-07-02 | Discharge: 2015-07-06 | DRG: 208 | Disposition: A | Discharge status: Home / Self Care | Payer: Self-pay | Attending: Family Medicine | Admitting: Family Medicine

## 2015-07-02 DIAGNOSIS — I2510 Atherosclerotic heart disease of native coronary artery without angina pectoris: Secondary | ICD-10-CM | POA: Diagnosis present

## 2015-07-02 DIAGNOSIS — R402122 Coma scale, eyes open, to pain, at arrival to emergency department: Secondary | ICD-10-CM | POA: Diagnosis present

## 2015-07-02 DIAGNOSIS — I252 Old myocardial infarction: Secondary | ICD-10-CM

## 2015-07-02 DIAGNOSIS — F17210 Nicotine dependence, cigarettes, uncomplicated: Secondary | ICD-10-CM | POA: Diagnosis present

## 2015-07-02 DIAGNOSIS — Z978 Presence of other specified devices: Secondary | ICD-10-CM

## 2015-07-02 DIAGNOSIS — J9600 Acute respiratory failure, unspecified whether with hypoxia or hypercapnia: Secondary | ICD-10-CM

## 2015-07-02 DIAGNOSIS — E7800 Pure hypercholesterolemia, unspecified: Secondary | ICD-10-CM | POA: Diagnosis present

## 2015-07-02 DIAGNOSIS — I427 Cardiomyopathy due to drug and external agent: Secondary | ICD-10-CM | POA: Diagnosis present

## 2015-07-02 DIAGNOSIS — G9340 Encephalopathy, unspecified: Secondary | ICD-10-CM

## 2015-07-02 DIAGNOSIS — R402352 Coma scale, best motor response, localizes pain, at arrival to emergency department: Secondary | ICD-10-CM | POA: Diagnosis present

## 2015-07-02 DIAGNOSIS — J9602 Acute respiratory failure with hypercapnia: Secondary | ICD-10-CM

## 2015-07-02 DIAGNOSIS — R739 Hyperglycemia, unspecified: Secondary | ICD-10-CM | POA: Diagnosis present

## 2015-07-02 DIAGNOSIS — J441 Chronic obstructive pulmonary disease with (acute) exacerbation: Secondary | ICD-10-CM | POA: Diagnosis present

## 2015-07-02 DIAGNOSIS — F1010 Alcohol abuse, uncomplicated: Secondary | ICD-10-CM | POA: Diagnosis present

## 2015-07-02 DIAGNOSIS — K219 Gastro-esophageal reflux disease without esophagitis: Secondary | ICD-10-CM | POA: Diagnosis present

## 2015-07-02 DIAGNOSIS — F1290 Cannabis use, unspecified, uncomplicated: Secondary | ICD-10-CM | POA: Diagnosis present

## 2015-07-02 DIAGNOSIS — E785 Hyperlipidemia, unspecified: Secondary | ICD-10-CM | POA: Diagnosis present

## 2015-07-02 DIAGNOSIS — Z7982 Long term (current) use of aspirin: Secondary | ICD-10-CM

## 2015-07-02 DIAGNOSIS — Z9289 Personal history of other medical treatment: Secondary | ICD-10-CM

## 2015-07-02 DIAGNOSIS — R402212 Coma scale, best verbal response, none, at arrival to emergency department: Secondary | ICD-10-CM | POA: Diagnosis present

## 2015-07-02 DIAGNOSIS — Z720 Tobacco use: Secondary | ICD-10-CM | POA: Diagnosis present

## 2015-07-02 DIAGNOSIS — F1410 Cocaine abuse, uncomplicated: Secondary | ICD-10-CM | POA: Diagnosis present

## 2015-07-02 DIAGNOSIS — R578 Other shock: Secondary | ICD-10-CM | POA: Diagnosis present

## 2015-07-02 DIAGNOSIS — J449 Chronic obstructive pulmonary disease, unspecified: Secondary | ICD-10-CM | POA: Diagnosis present

## 2015-07-02 DIAGNOSIS — T405X1A Poisoning by cocaine, accidental (unintentional), initial encounter: Secondary | ICD-10-CM | POA: Diagnosis present

## 2015-07-02 DIAGNOSIS — Z951 Presence of aortocoronary bypass graft: Secondary | ICD-10-CM

## 2015-07-02 DIAGNOSIS — J9601 Acute respiratory failure with hypoxia: Principal | ICD-10-CM | POA: Diagnosis present

## 2015-07-02 DIAGNOSIS — Z801 Family history of malignant neoplasm of trachea, bronchus and lung: Secondary | ICD-10-CM

## 2015-07-02 DIAGNOSIS — I255 Ischemic cardiomyopathy: Secondary | ICD-10-CM | POA: Diagnosis present

## 2015-07-02 DIAGNOSIS — I10 Essential (primary) hypertension: Secondary | ICD-10-CM | POA: Diagnosis present

## 2015-07-02 DIAGNOSIS — E876 Hypokalemia: Secondary | ICD-10-CM | POA: Diagnosis not present

## 2015-07-02 DIAGNOSIS — N179 Acute kidney failure, unspecified: Secondary | ICD-10-CM | POA: Diagnosis present

## 2015-07-02 HISTORY — DX: Angina pectoris, unspecified: I20.9

## 2015-07-02 HISTORY — DX: Reserved for inherently not codable concepts without codable children: IMO0001

## 2015-07-02 LAB — CBC WITH DIFFERENTIAL/PLATELET
BASOS ABS: 0 10*3/uL (ref 0.0–0.1)
BASOS ABS: 0 10*3/uL (ref 0.0–0.1)
BASOS PCT: 1 %
Basophils Relative: 0 %
EOS PCT: 10 %
EOS PCT: 3 %
Eosinophils Absolute: 0.9 10*3/uL — ABNORMAL HIGH (ref 0.0–0.7)
Eosinophils Absolute: 0.2 10*3/uL (ref 0.0–0.7)
HCT: 42 % (ref 39.0–52.0)
HCT: 37.2 % — ABNORMAL LOW (ref 39.0–52.0)
HEMOGLOBIN: 12.1 g/dL — AB (ref 13.0–17.0)
Hemoglobin: 13.9 g/dL (ref 13.0–17.0)
LYMPHS ABS: 0.9 10*3/uL (ref 0.7–4.0)
LYMPHS PCT: 43 %
LYMPHS PCT: 13 %
Lymphs Abs: 3.6 10*3/uL (ref 0.7–4.0)
MCH: 29.1 pg (ref 26.0–34.0)
MCH: 28.9 pg (ref 26.0–34.0)
MCHC: 33.1 g/dL (ref 30.0–36.0)
MCHC: 32.5 g/dL (ref 30.0–36.0)
MCV: 88.1 fL (ref 78.0–100.0)
MCV: 88.8 fL (ref 78.0–100.0)
MONO ABS: 0.8 10*3/uL (ref 0.1–1.0)
Monocytes Absolute: 0.3 10*3/uL (ref 0.1–1.0)
Monocytes Relative: 9 %
Monocytes Relative: 5 %
NEUTROS PCT: 79 %
Neutro Abs: 3.1 10*3/uL (ref 1.7–7.7)
Neutro Abs: 5.2 10*3/uL (ref 1.7–7.7)
Neutrophils Relative %: 37 %
PLATELETS: 272 10*3/uL (ref 150–400)
Platelets: 319 10*3/uL (ref 150–400)
RBC: 4.77 MIL/uL (ref 4.22–5.81)
RBC: 4.19 MIL/uL — AB (ref 4.22–5.81)
RDW: 15.2 % (ref 11.5–15.5)
RDW: 15.2 % (ref 11.5–15.5)
WBC: 8.4 10*3/uL (ref 4.0–10.5)
WBC: 6.7 10*3/uL (ref 4.0–10.5)

## 2015-07-02 LAB — URINE MICROSCOPIC-ADD ON

## 2015-07-02 LAB — I-STAT ARTERIAL BLOOD GAS, ED
ACID-BASE DEFICIT: 1 mmol/L (ref 0.0–2.0)
Bicarbonate: 29.2 mEq/L — ABNORMAL HIGH (ref 20.0–24.0)
O2 SAT: 100 %
PH ART: 7.193 — AB (ref 7.350–7.450)
PO2 ART: 262 mmHg — AB (ref 80.0–100.0)
Patient temperature: 97.5
TCO2: 31 mmol/L (ref 0–100)
pCO2 arterial: 75.3 mmHg (ref 35.0–45.0)

## 2015-07-02 LAB — URINALYSIS, ROUTINE W REFLEX MICROSCOPIC
BILIRUBIN URINE: NEGATIVE
BILIRUBIN URINE: NEGATIVE
GLUCOSE, UA: 100 mg/dL — AB
Glucose, UA: NEGATIVE mg/dL
KETONES UR: NEGATIVE mg/dL
KETONES UR: NEGATIVE mg/dL
Leukocytes, UA: NEGATIVE
Leukocytes, UA: NEGATIVE
Nitrite: NEGATIVE
Nitrite: NEGATIVE
PROTEIN: 100 mg/dL — AB
PROTEIN: 100 mg/dL — AB
Specific Gravity, Urine: 1.022 (ref 1.005–1.030)
Specific Gravity, Urine: 1.025 (ref 1.005–1.030)
pH: 5.5 (ref 5.0–8.0)
pH: 5.5 (ref 5.0–8.0)

## 2015-07-02 LAB — COMPREHENSIVE METABOLIC PANEL
ALK PHOS: 89 U/L (ref 38–126)
ALT: 18 U/L (ref 17–63)
ALT: 17 U/L (ref 17–63)
AST: 24 U/L (ref 15–41)
AST: 20 U/L (ref 15–41)
Albumin: 3.6 g/dL (ref 3.5–5.0)
Albumin: 3.1 g/dL — ABNORMAL LOW (ref 3.5–5.0)
Alkaline Phosphatase: 110 U/L (ref 38–126)
Anion gap: 8 (ref 5–15)
Anion gap: 9 (ref 5–15)
BUN: 10 mg/dL (ref 6–20)
BUN: 9 mg/dL (ref 6–20)
CHLORIDE: 106 mmol/L (ref 101–111)
CHLORIDE: 106 mmol/L (ref 101–111)
CO2: 25 mmol/L (ref 22–32)
CO2: 26 mmol/L (ref 22–32)
Calcium: 8.4 mg/dL — ABNORMAL LOW (ref 8.9–10.3)
Calcium: 8.1 mg/dL — ABNORMAL LOW (ref 8.9–10.3)
Creatinine, Ser: 1.21 mg/dL (ref 0.61–1.24)
Creatinine, Ser: 1.25 mg/dL — ABNORMAL HIGH (ref 0.61–1.24)
GFR calc Af Amer: >60 mL/min (ref >60)
GLUCOSE: 213 mg/dL — AB (ref 65–99)
Glucose, Bld: 162 mg/dL — ABNORMAL HIGH (ref 65–99)
Potassium: 3.9 mmol/L (ref 3.5–5.1)
Potassium: 4.1 mmol/L (ref 3.5–5.1)
SODIUM: 141 mmol/L (ref 135–145)
Sodium: 139 mmol/L (ref 135–145)
Total Bilirubin: 0.5 mg/dL (ref 0.3–1.2)
Total Bilirubin: 0.5 mg/dL (ref 0.3–1.2)
Total Protein: 6.6 g/dL (ref 6.5–8.1)
Total Protein: 5.7 g/dL — ABNORMAL LOW (ref 6.5–8.1)

## 2015-07-02 LAB — POCT I-STAT 3, ART BLOOD GAS (G3+)
ACID-BASE DEFICIT: 2 mmol/L (ref 0.0–2.0)
Bicarbonate: 24.4 mEq/L — ABNORMAL HIGH (ref 20.0–24.0)
O2 SAT: 99 %
PCO2 ART: 48.8 mmHg — AB (ref 35.0–45.0)
PH ART: 7.308 — AB (ref 7.350–7.450)
PO2 ART: 128 mmHg — AB (ref 80.0–100.0)
Patient temperature: 98.6
TCO2: 26 mmol/L (ref 0–100)

## 2015-07-02 LAB — RAPID URINE DRUG SCREEN, HOSP PERFORMED
AMPHETAMINES: NOT DETECTED
BENZODIAZEPINES: NOT DETECTED
Barbiturates: NOT DETECTED
Cocaine: POSITIVE — AB
Opiates: NOT DETECTED
Tetrahydrocannabinol: POSITIVE — AB

## 2015-07-02 LAB — TROPONIN I
Troponin I: <0.03 ng/mL (ref <0.031)
Troponin I: <0.03 ng/mL (ref <0.031)
Troponin I: <0.03 ng/mL (ref <0.031)
Troponin I: <0.03 ng/mL (ref <0.031)

## 2015-07-02 LAB — APTT: APTT: 39 s — AB (ref 24–37)

## 2015-07-02 LAB — PROTIME-INR
INR: 1.07 (ref 0.00–1.49)
PROTHROMBIN TIME: 14.1 s (ref 11.6–15.2)

## 2015-07-02 LAB — I-STAT CG4 LACTIC ACID, ED: LACTIC ACID, VENOUS: 0.98 mmol/L (ref 0.5–2.0)

## 2015-07-02 LAB — TRIGLYCERIDES: Triglycerides: 102 mg/dL (ref <150)

## 2015-07-02 LAB — MRSA PCR SCREENING: MRSA BY PCR: POSITIVE — AB

## 2015-07-02 LAB — BRAIN NATRIURETIC PEPTIDE
B NATRIURETIC PEPTIDE 5: 178.8 pg/mL — AB (ref 0.0–100.0)
B Natriuretic Peptide: 253.3 pg/mL — ABNORMAL HIGH (ref 0.0–100.0)

## 2015-07-02 LAB — LACTIC ACID, PLASMA: LACTIC ACID, VENOUS: 0.7 mmol/L (ref 0.5–2.0)

## 2015-07-02 LAB — HEPARIN LEVEL (UNFRACTIONATED): Heparin Unfractionated: 0.28 IU/mL — ABNORMAL LOW (ref 0.30–0.70)

## 2015-07-02 LAB — GLUCOSE, CAPILLARY: GLUCOSE-CAPILLARY: 127 mg/dL — AB (ref 65–99)

## 2015-07-02 MED ORDER — SODIUM CHLORIDE 0.9 % IV SOLN
INTRAVENOUS | Status: DC
  Administered 2015-07-02: 100 mL/h via INTRAVENOUS
  Administered 2015-07-03 – 2015-07-05 (×2): via INTRAVENOUS

## 2015-07-02 MED ORDER — PROPOFOL 1000 MG/100ML IV EMUL
5.0000 ug/kg/min | Freq: Once | INTRAVENOUS | Status: AC
  Administered 2015-07-02: 5 ug/kg/min via INTRAVENOUS
  Filled 2015-07-02: qty 100

## 2015-07-02 MED ORDER — FENTANYL CITRATE (PF) 2500 MCG/50ML IJ SOLN
25.0000 ug/h | INTRAMUSCULAR | Status: DC
  Administered 2015-07-02: 50 ug/h via INTRAVENOUS
  Administered 2015-07-03: 200 ug/h via INTRAVENOUS
  Filled 2015-07-02 (×2): qty 50

## 2015-07-02 MED ORDER — DEXTROSE 5 % IV SOLN
500.0000 mg | INTRAVENOUS | Status: DC
  Administered 2015-07-02 – 2015-07-03 (×2): 500 mg via INTRAVENOUS
  Filled 2015-07-02 (×3): qty 500

## 2015-07-02 MED ORDER — METHYLPREDNISOLONE SODIUM SUCC 125 MG IJ SOLR
60.0000 mg | Freq: Four times a day (QID) | INTRAMUSCULAR | Status: DC
  Administered 2015-07-02 – 2015-07-04 (×9): 60 mg via INTRAVENOUS
  Filled 2015-07-02 (×3): qty 2
  Filled 2015-07-02: qty 0.96
  Filled 2015-07-02: qty 2
  Filled 2015-07-02 (×3): qty 0.96
  Filled 2015-07-02: qty 2
  Filled 2015-07-02 (×4): qty 0.96
  Filled 2015-07-02: qty 2

## 2015-07-02 MED ORDER — DEXTROSE 5 % IV SOLN
0.0000 ug/min | INTRAVENOUS | Status: DC
  Administered 2015-07-02: 8 ug/min via INTRAVENOUS
  Filled 2015-07-02: qty 4

## 2015-07-02 MED ORDER — CHLORHEXIDINE GLUCONATE 0.12% ORAL RINSE (MEDLINE KIT)
15.0000 mL | Freq: Two times a day (BID) | OROMUCOSAL | Status: DC
  Administered 2015-07-02 – 2015-07-03 (×3): 15 mL via OROMUCOSAL

## 2015-07-02 MED ORDER — FENTANYL CITRATE (PF) 100 MCG/2ML IJ SOLN
INTRAMUSCULAR | Status: AC
  Administered 2015-07-02: 100 ug via INTRAVENOUS
  Filled 2015-07-02: qty 2

## 2015-07-02 MED ORDER — ROCURONIUM BROMIDE 50 MG/5ML IV SOLN
80.0000 mg | Freq: Once | INTRAVENOUS | Status: AC
  Administered 2015-07-02: 10 mg via INTRAVENOUS

## 2015-07-02 MED ORDER — HEPARIN BOLUS VIA INFUSION
4000.0000 [IU] | Freq: Once | INTRAVENOUS | Status: AC
  Administered 2015-07-02: 4000 [IU] via INTRAVENOUS
  Filled 2015-07-02: qty 4000

## 2015-07-02 MED ORDER — FENTANYL CITRATE (PF) 100 MCG/2ML IJ SOLN
INTRAMUSCULAR | Status: AC
  Filled 2015-07-02: qty 2

## 2015-07-02 MED ORDER — HEPARIN (PORCINE) IN NACL 100-0.45 UNIT/ML-% IJ SOLN
1150.0000 [IU]/h | INTRAMUSCULAR | Status: DC
  Administered 2015-07-02: 1000 [IU]/h via INTRAVENOUS
  Administered 2015-07-03 – 2015-07-04 (×2): 1150 [IU]/h via INTRAVENOUS
  Filled 2015-07-02 (×6): qty 250

## 2015-07-02 MED ORDER — ALBUTEROL SULFATE (2.5 MG/3ML) 0.083% IN NEBU
2.5000 mg | INHALATION_SOLUTION | RESPIRATORY_TRACT | Status: DC | PRN
  Administered 2015-07-05: 2.5 mg via RESPIRATORY_TRACT
  Filled 2015-07-02: qty 3

## 2015-07-02 MED ORDER — CHLORHEXIDINE GLUCONATE CLOTH 2 % EX PADS
6.0000 | MEDICATED_PAD | Freq: Every day | CUTANEOUS | Status: DC
  Administered 2015-07-02 – 2015-07-05 (×4): 6 via TOPICAL

## 2015-07-02 MED ORDER — PROPOFOL 1000 MG/100ML IV EMUL
5.0000 ug/kg/min | INTRAVENOUS | Status: DC
  Administered 2015-07-02: 20 ug/kg/min via INTRAVENOUS
  Administered 2015-07-03: 10 ug/kg/min via INTRAVENOUS
  Administered 2015-07-03: 15 ug/kg/min via INTRAVENOUS
  Filled 2015-07-02 (×3): qty 100

## 2015-07-02 MED ORDER — ANTISEPTIC ORAL RINSE SOLUTION (CORINZ)
7.0000 mL | Freq: Four times a day (QID) | OROMUCOSAL | Status: DC
  Administered 2015-07-02 – 2015-07-03 (×4): 7 mL via OROMUCOSAL

## 2015-07-02 MED ORDER — FENTANYL CITRATE (PF) 100 MCG/2ML IJ SOLN
INTRAMUSCULAR | Status: DC | PRN
  Administered 2015-07-02 (×2): 100 ug via INTRAVENOUS

## 2015-07-02 MED ORDER — DEXTROSE 5 % IV SOLN
1.0000 g | INTRAVENOUS | Status: DC
  Administered 2015-07-02 – 2015-07-04 (×3): 1 g via INTRAVENOUS
  Filled 2015-07-02 (×3): qty 10

## 2015-07-02 MED ORDER — PANTOPRAZOLE SODIUM 40 MG IV SOLR
40.0000 mg | INTRAVENOUS | Status: DC
  Administered 2015-07-02 – 2015-07-03 (×2): 40 mg via INTRAVENOUS
  Filled 2015-07-02 (×3): qty 40

## 2015-07-02 MED ORDER — ALBUTEROL SULFATE (2.5 MG/3ML) 0.083% IN NEBU
2.5000 mg | INHALATION_SOLUTION | RESPIRATORY_TRACT | Status: DC
  Administered 2015-07-02: 2.5 mg via RESPIRATORY_TRACT
  Filled 2015-07-02: qty 3

## 2015-07-02 MED ORDER — ETOMIDATE 2 MG/ML IV SOLN
20.0000 mg | Freq: Once | INTRAVENOUS | Status: AC
  Administered 2015-07-02: 20 mg via INTRAVENOUS

## 2015-07-02 MED ORDER — HEPARIN SODIUM (PORCINE) 5000 UNIT/ML IJ SOLN: 5000.0000 [IU] | Freq: Three times a day (TID) | INTRAMUSCULAR | Status: DC

## 2015-07-02 MED ORDER — IPRATROPIUM-ALBUTEROL 0.5-2.5 (3) MG/3ML IN SOLN
3.0000 mL | RESPIRATORY_TRACT | Status: DC
  Administered 2015-07-02 – 2015-07-05 (×19): 3 mL via RESPIRATORY_TRACT
  Filled 2015-07-02 (×19): qty 3

## 2015-07-02 MED ORDER — OSELTAMIVIR PHOSPHATE 6 MG/ML PO SUSR
75.0000 mg | Freq: Two times a day (BID) | ORAL | Status: DC
  Administered 2015-07-02 – 2015-07-03 (×3): 75 mg
  Filled 2015-07-02 (×5): qty 12.5

## 2015-07-02 MED ORDER — MUPIROCIN 2 % EX OINT
1.0000 "application " | TOPICAL_OINTMENT | Freq: Two times a day (BID) | CUTANEOUS | Status: DC
  Administered 2015-07-02 – 2015-07-05 (×8): 1 via NASAL
  Filled 2015-07-02 (×3): qty 22

## 2015-07-02 NOTE — Progress Notes (Signed)
ANTICOAGULATION CONSULT NOTE - Initial Consult  Pharmacy Consult for heparin Indication:for possible ACS, Ischemic EKG changes No Known Allergies  Patient Measurements: Height: 6' (182.9 cm) Weight: 183 lb 3.2 oz (83.1 kg) IBW/kg (Calculated) : 77.6 Heparin Dosing Weight: 83.1 kg  Vital Signs: BP: 119/74 mmHg (01/19 1030) Pulse Rate: 59 (01/19 1030)  Labs:  Recent Labs  07/02/15 0705  HGB 13.9  HCT 42.0  PLT 319  CREATININE 1.21  TROPONINI <0.03    Estimated Creatinine Clearance: 70.4 mL/min (by C-G formula based on Cr of 1.21).   Medical History: Past Medical History  Diagnosis Date  . COPD (chronic obstructive pulmonary disease) ([place])   . Perforation of duodenal ulcer ([place])     a. 1970s - s/p surgery.   . H/O: GI bleed     a. 60 y/o ago, r/t ulcer. Denies recent GIB.  . H/O: eczema     lower extremities  . High cholesterol   . Hypertension   . Cocaine abuse   . Tobacco abuse   . CAD (coronary artery disease)     4V CABG 10/31/13  . Myocardial infarction ([place])   . GERD (gastroesophageal reflux disease)     Assessment: 60 yo M in ED intubated and sedated.  Pharmacy consulted to dose heparin for for possible ACS, Ischemic EKG changes. HDW 83.1 kg; CBC WNL.  Creat 1.21.    Goal of Therapy:  Heparin level 0.3-0.7 units/ml Monitor platelets by anticoagulation protocol: Yes   Plan:  Dc sq heparin 5k q8h Give 4000 units bolus x 1 Start heparin infusion at 1000 units/hr Check anti-Xa level in 6 hours and daily while on heparin Continue to monitor H&H and platelets   Eudelia Bunch, Pharm.D. BP:[id number] 07/02/2015 11:28 AM

## 2015-07-02 NOTE — ED Provider Notes (Signed)
INTUBATION Performed by: Abigail Butts  Required items: required blood products, implants, devices, and special equipment available Patient identity confirmed: provided demographic data and hospital-assigned identification number Time out: Immediately prior to procedure a "time out" was called to verify the correct patient, procedure, equipment, support staff and site/side marked as required.  Indications: respiratory distress, AMS,   Intubation method: Direct Laryngoscopy   Preoxygenation: BVM  Sedatives: 20mg  Etomidate Paralytic: 80mg  Rocuronium   Tube Size: 7.5 cuffed  Post-procedure assessment: chest rise and ETCO2 monitor Breath sounds: equal and absent over the epigastrium Tube secured with: ETT holder Chest x-ray interpreted by radiologist and me.  Chest x-ray findings: endotracheal tube 7.2cm above the carina; will advance 3cm  Patient tolerated the procedure well with no immediate complications.     Johnny Soho Jahsiah Carpenter, PA-C 07/02/15 [place], MD 07/02/15 [phone]

## 2015-07-02 NOTE — Progress Notes (Signed)
RT advanced patients ETT 3 cm per ED physician orders. RT will continue to monitor.

## 2015-07-02 NOTE — ED Notes (Signed)
Pt. arrived with EMS from work reports worsening SOB this morning , received Albuterol 10 mg/Atrovent 500 mcg and Epinephrine .15 mg I/O at right tibia , assisting ventilation BMV at arrival .

## 2015-07-02 NOTE — ED Notes (Signed)
Attempted report 

## 2015-07-02 NOTE — Consult Note (Signed)
CARDIOLOGY CONSULT NOTE   Patient ID: [name] MRN: [id number] DOB/AGE: 07-07-1955 60 y.o.  Admit date: 07/02/2015  Primary Physician   Elizabeth Palau, MD Primary Cardiologist   Burnell Blanks, MD Reason for Consultation   EKG changes  Requesting Physician Dr. Nelda Marseille  HPI: [name] is a 60 y.o. male with a history of tobacco abuse, cocaine abuse, marijuana use CAD s/p CABG, COPD, PUD, HTN and HL who was brought by EMS due to acute respiratory failure with hypoxia.   Admitted to [facility] with NSTEMI 10/26/13. Cardiac cath 10/28/13 with severe triple vessel CAD, LVEF=55-60%. He underwent 4V CABG on 10/31/13 (LIMA to LAD, SVG to OM2/OM2, SVG to PDA). He did well following his CABG. He was doing well on cardiac stand point when seen last by Dr. Angelena Form 11/11/2013.  He was at work this morning 1/19 (at a [employer]) where he was complaining of progressively worsening shortness of breath.  EMS was called and found patient sitting on toilet in bathroom, leaning forward and tripoding with oxygen saturation of 70% on room air, they administered albuterol 10 mg, Atrovent 0.5 mg, Epi 0.15 mg with no improvement. He became unresponsive on route. In ED, he became hypotensive and intubated, central line placed. UDS positive for cocaine and marijuana. Initial EKG (around 7AM) showed diffuse TWI in most of leads however repeat EKD (~8:30am) showed ST depression globally with repolarization abnormality. Chest x-ray showed copd changes.   The patient is sedated and intubated. No family at bed side. All hx obtained from records.     Past Medical History  Diagnosis Date  . COPD (chronic obstructive pulmonary disease) ([place])   . Perforation of duodenal ulcer ([place])     a. 1970s - s/p surgery.   . H/O: GI bleed     a. 60 y/o ago, r/t ulcer. Denies recent GIB.  . H/O: eczema     lower extremities  . High cholesterol   . Hypertension   . Cocaine abuse   . Tobacco abuse   . CAD  (coronary artery disease)     4V CABG 10/31/13  . Myocardial infarction ([place])   . GERD (gastroesophageal reflux disease)      Past Surgical History  Procedure Laterality Date  . Stomach sx    . Coronary artery bypass graft N/A 10/31/2013    Procedure: CORONARY ARTERY BYPASS GRAFTING TIMES FOUR ON PUMP USING LEFT INTERNAL MAMMARY ARTERY AND RIGHT GREATER SAPHENOUS VEIN VIA ENDOVEIN HARVEST.;  Surgeon: Melrose Nakayama, MD;  Location: [place];  Service: Open Heart Surgery;  Laterality: N/A;  . Intraoperative transesophageal echocardiogram N/A 10/31/2013    Procedure: INTRAOPERATIVE TRANSESOPHAGEAL ECHOCARDIOGRAM;  Surgeon: Melrose Nakayama, MD;  Location: [place];  Service: Open Heart Surgery;  Laterality: N/A;  . Left heart catheterization with coronary angiogram N/A 10/28/2013    Procedure: LEFT HEART CATHETERIZATION WITH CORONARY ANGIOGRAM;  Surgeon: Burnell Blanks, MD;  Location: [facility] CATH LAB;  Service: Cardiovascular;  Laterality: N/A;    No Known Allergies  I have reviewed the patient's current medications . heparin subcutaneous  5,000 Units Subcutaneous 3 times per day  . ipratropium-albuterol  3 mL Nebulization Q4H  . methylPREDNISolone (SOLU-MEDROL) injection  60 mg Intravenous Q6H  . oseltamivir  75 mg Per Tube BID  . pantoprazole (PROTONIX) IV  40 mg Intravenous Q24H   . sodium chloride 100 mL/hr (07/02/15 1002)  . azithromycin    . cefTRIAXone (ROCEPHIN)  IV 1 g (07/02/15  [id number])  . fentaNYL infusion INTRAVENOUS 200 mcg/hr (07/02/15 0950)  . norepinephrine (LEVOPHED) Adult infusion 8 mcg/min (07/02/15 0936)  . propofol (DIPRIVAN) infusion     albuterol, fentaNYL  Prior to Admission medications   Medication Sig Start Date End Date Taking? Authorizing Provider  aspirin EC 325 MG EC tablet Take 1 tablet (325 mg total) by mouth daily. 11/05/13   Wayne E Gold, PA-C  atorvastatin (LIPITOR) 80 MG tablet Take 1 tablet (80 mg total) by mouth daily at 6 PM. 11/05/13   John Giovanni, PA-C  carvedilol (COREG) 12.5 MG tablet Take 1 tablet (12.5 mg total) by mouth 2 (two) times daily with a meal. 11/05/13   Wayne E Gold, PA-C  fluticasone (FLONASE) 50 MCG/ACT nasal spray Place 2 sprays into both nostrils daily. 09/04/14   Janece Canterbury, MD  Ipratropium-Albuterol (COMBIVENT) 20-100 MCG/ACT AERS respimat Inhale 1 puff into the lungs every 6 (six) hours as needed for wheezing or shortness of breath. 09/04/14   Janece Canterbury, MD  levofloxacin (LEVAQUIN) 500 MG tablet Take 1 tablet (500 mg total) by mouth daily. 09/04/14   Janece Canterbury, MD  mometasone-formoterol (DULERA) 100-5 MCG/ACT AERO Inhale 2 puffs into the lungs 2 (two) times daily. 09/04/14   Janece Canterbury, MD  nicotine (NICODERM CQ - DOSED IN MG/24 HOURS) 14 mg/24hr patch Place 1 patch (14 mg total) onto the skin daily. 09/04/14   Janece Canterbury, MD  nicotine (NICODERM CQ - DOSED IN MG/24 HR) 7 mg/24hr patch Place 1 patch (7 mg total) onto the skin daily. 09/04/14   Janece Canterbury, MD  predniSONE (DELTASONE) 20 MG tablet Take 2 tabs daily for two days, 1 tab daily for two days, half tab daily for 2 days, then stop 09/04/14   Janece Canterbury, MD     Social History   Social History  . Marital Status: Single    Spouse Name: N/A  . Number of Children: N/A  . Years of Education: N/A   Occupational History  . Not on file.   Social History Main Topics  . Smoking status: Current Every Day Smoker -- 0.50 packs/day for 40 years    Types: Cigarettes  . Smokeless tobacco: Never Used  . Alcohol Use: 3.6 oz/week    6 Cans of beer per week  . Drug Use: Yes    Special: Marijuana, Cocaine     Comment: "about 1 marijuana cigarette a week"  . Sexual Activity: Yes    Birth Control/ Protection: None   Other Topics Concern  . Not on file   Social History Narrative    Family Status  Relation Status Death Age  . Mother Alive     dementia  . Father Deceased 71    lung cancer  . Brother Deceased     aids  . Brother  Alive    Family History  Problem Relation Age of Onset  . Lung cancer         ROS:  Full 14 point review of systems complete and found to be negative unless listed above.  Physical Exam: Blood pressure 155/99, pulse 101, resp. rate 16, height 6' (1.829 m), weight 183 lb 3.2 oz (83.1 kg), SpO2 100 %.  General: sedated and intubated, ill appearing Head: ETT in place Lungs: intubated. Expiratory wheezing Heart: RRR no s3, s4, or murmurs..   Neck: No carotid bruits. No lymphadenopathy.  No JVD. Abdomen: Bowel sounds present, abdomen soft  Msk: . No weakness, no joint deformities or  effusions. Extremities: No clubbing, cyanosis or edema. DP 1+ and equal bilaterally. Neuro: sedated, responsive to painful stimuli Psych: unable to assess as patient is intubated Skin: No rashes or lesions noted.  Labs:   Lab Results  Component Value Date   WBC 8.4 07/02/2015   HGB 13.9 07/02/2015   HCT 42.0 07/02/2015   MCV 88.1 07/02/2015   PLT 319 07/02/2015   No results for input(s): INR in the last 72 hours.  Recent Labs Lab 07/02/15 0705  NA 139  K 3.9  CL 106  CO2 25  BUN 10  CREATININE 1.21  CALCIUM 8.4*  PROT 6.6  BILITOT 0.5  ALKPHOS 110  ALT 18  AST 24  GLUCOSE 213*  ALBUMIN 3.6   MAGNESIUM  Date Value Ref Range Status  11/01/2013 2.2 1.5 - 2.5 mg/dL Final    Recent Labs  07/02/15 0705  TROPONINI <0.03   No results for input(s): TROPIPOC in the last 72 hours. PRO B NATRIURETIC PEPTIDE (BNP)  Date/Time Value Ref Range Status  10/26/2013 05:02 AM 327.4* 0 - 125 pg/mL Final   Lab Results  Component Value Date   CHOL 220* 10/26/2013   HDL 76 10/26/2013   LDLCALC 130* 10/26/2013   TRIG 68 10/26/2013   Drugs of Abuse     Component Value Date/Time   LABOPIA NONE DETECTED 07/02/2015 0719   COCAINSCRNUR POSITIVE* 07/02/2015 0719   LABBENZ NONE DETECTED 07/02/2015 0719   AMPHETMU NONE DETECTED 07/02/2015 0719   THCU POSITIVE* 07/02/2015 0719   LABBARB NONE  DETECTED 07/02/2015 0719    Echo: 09/03/2014 LV EF: 45% -  50%  ------------------------------------------------------------------- Indications:   CHF - 428.0. Respiratory Failure acute 518.81.  ------------------------------------------------------------------- History:  PMH:  Chronic obstructive pulmonary disease. Risk factors: Current tobacco use. Dyslipidemia.  ------------------------------------------------------------------- Study Conclusions  - Left ventricle: The cavity size was normal. Wall thickness was normal. Systolic function was mildly reduced. The estimated ejection fraction was in the range of 45% to 50%. There is hypokinesis of the inferolateral myocardium. Doppler parameters are consistent with abnormal left ventricular relaxation (grade 1 diastolic dysfunction). - Left atrium: The atrium was mildly dilated.  ECG:  Vent. rate 104 BPM PR interval 149 ms QRS duration 104 ms QT/QTc 364/479 ms P-R-T axes 78 20 249   Cardiac cath 10/28/13: Left main: No obstructive disease.  Left Anterior Descending Artery: Large caliber vessel that courses to the apex. The proximal has long hazy 80% stenosis. There is mild plaque in the mid and distal vessel. Small caliber first diagonal 70% stenosis.  Circumflex Artery: Large caliber vessel with proximal 99% stenosis. The first obtuse marginal branch is moderate in caliber with no obstructive disease. The second obtuse marginal branch is moderate in caliber with proximal 70% stenosis. The distal AV groove Circumflex has 50% stenosis.  Right Coronary Artery: Large dominant vessel with diffuse 40% mid stenosis followed by a focal 70-80% mid stenosis. The distal vessel has mild plaque disease. The PDA is a moderate caliber vessel with serial 40% stenoses.  Left Ventricular Angiogram: LVEF=55-60%   Radiology:  Dg Chest Portable 1 View  07/02/2015  CLINICAL DATA:  Intubated, risk prior tori distress EXAM:  PORTABLE CHEST 1 VIEW COMPARISON:  07/02/2015 FINDINGS: Endotracheal tube with the tip 6.9 cm above the carina. Nasogastric tube coursing below the diaphragm. There is no focal parenchymal opacity. There is no pleural effusion or pneumothorax. The heart and mediastinal contours are unremarkable. There is evidence of prior CABG. The osseous structures  are unremarkable. IMPRESSION: 1. No significant interval change. 2. Endotracheal tube with the tip 6.9 cm above the carina. Electronically Signed   By: Kathreen Devoid   On: 07/02/2015 09:43   Dg Chest Portable 1 View  07/02/2015  CLINICAL DATA:  Status post central line placement. EXAM: PORTABLE CHEST 1 VIEW COMPARISON:  July 02, 2015 FINDINGS: A left subclavian line has been placed in the interval. The distal tip appears to be in the central SVC. No pneumothorax. The cardiomediastinal silhouette is stable. The lung bases were not completely included on today's study. However, within visualize limits, no acute interval changes are otherwise seen. ET tube remains in good position. The NG tube terminates below today's film. IMPRESSION: Interval placement of a left subclavian line as described above without pneumothorax. No other interval changes. Electronically Signed   By: Dorise Bullion III M.D   On: 07/02/2015 09:31   Dg Chest Port 1 View  07/02/2015  CLINICAL DATA:  Shortness of breath, intubation, worsening respiratory distress this morning, COPD, coronary artery disease post MI and CABG, hypertension EXAM: PORTABLE CHEST 1 VIEW COMPARISON:  Portable exam 0703 hours compared to 09/01/2014 FINDINGS: Tip of endotracheal tube projects approximately 7.2 cm above carina. Nasogastric tube extends into stomach. Normal heart size post CABG. Mediastinal contours and pulmonary vascularity normal. Emphysematous changes without infiltrate, pleural effusion or pneumothorax. No acute osseous findings. IMPRESSION: Tube positions as above. COPD changes and evidence of prior  CABG. No acute abnormalities. Electronically Signed   By: Lavonia Dana M.D.   On: 07/02/2015 07:21    ASSESSMENT AND PLAN:     1. EKG changes - Initial EKG (around 7AM) showed diffuse TWI in most of leads however repeat EKD (~8:30am) showed ST depression globally with repolarization abnormality in setting of positive UDS of cocaine.  - No baseline cardiac status.? He takes his medication  or not. BNP of 253.3. POC troponin negative.  - Will get echo. IV heparin base on EKG for ACS.   2. Acute respiratory failure with hypoxia - Per primary  3. CAD: s/p 4V CABG 10/31/13. He is doing well. Continue ASA, statin, beta blocker. LV function is normal. Echo 09/06/14 showed LV EF of 45-50% with grade 1 DD.   4. Tobacco abuse: He has stopped smoking.   5. HTN: BP controlled. No changes.   6. HLD: Continue statin.   SignedLeanor Kail, [place] 07/02/2015, 10:12 AM Pager [phone]  Co-Sign MD

## 2015-07-02 NOTE — ED Notes (Addendum)
Pt with SBP dropping into 40's.  Verified with manual.  Bolus of ns started with pressure bag.  Dr Nelda Marseille paged.

## 2015-07-02 NOTE — Progress Notes (Signed)
Patient transported to room 2M11 from the ED without any apparent complications. RT will continue to monitor.

## 2015-07-02 NOTE — Procedures (Signed)
Central Venous Catheter Insertion Procedure Note [name] [id number] April 13, 1956  Procedure: Insertion of Central Venous Catheter Indications: Assessment of intravascular volume, Drug and/or fluid administration and Frequent blood sampling  Procedure Details Consent: Unable to obtain consent because of emergent medical necessity. Time Out: Verified patient identification, verified procedure, site/side was marked, verified correct patient position, special equipment/implants available, medications/allergies/relevent history reviewed, required imaging and test results available.  Performed  Maximum sterile technique was used including antiseptics, cap, gloves, gown, hand hygiene, mask and sheet. Skin prep: Chlorhexidine; local anesthetic administered A antimicrobial bonded/coated triple lumen catheter was placed in the left subclavian vein using the Seldinger technique.  Evaluation Blood flow good Complications: No apparent complications Patient did tolerate procedure well. Chest X-ray ordered to verify placement.  CXR: pending.  U/S used in placement.  [name] 07/02/2015, 9:18 AM

## 2015-07-02 NOTE — Progress Notes (Signed)
ANTICOAGULATION CONSULT NOTE - Initial Consult  Pharmacy Consult for heparin Indication:for possible ACS, Ischemic EKG changes No Known Allergies  Patient Measurements: Height: 6' (182.9 cm) Weight: 183 lb 3.2 oz (83.1 kg) IBW/kg (Calculated) : 77.6 Heparin Dosing Weight: 83.1 kg  Vital Signs: Temp: 95.8 F (35.4 C) (01/19 1745) Temp Source: Axillary (01/19 1745) BP: 117/66 mmHg (01/19 1845) Pulse Rate: 70 (01/19 1845)  Labs:  Recent Labs  07/02/15 0705 07/02/15 1123 07/02/15 1134 07/02/15 1530 07/02/15 1843  HGB 13.9  --  12.1*  --   --   HCT 42.0  --  37.2*  --   --   PLT 319  --  272  --   --   APTT  --   --  39*  --   --   LABPROT  --   --  14.1  --   --   INR  --   --  1.07  --   --   HEPARINUNFRC  --   --   --   --  0.28*  CREATININE 1.21  --  1.25*  --   --   TROPONINI <0.03 <0.03  --  <0.03  --     Estimated Creatinine Clearance: 68.1 mL/min (by C-G formula based on Cr of 1.25).   Medical History: Past Medical History  Diagnosis Date  . COPD (chronic obstructive pulmonary disease) ([place])   . Perforation of duodenal ulcer ([place])     a. 1970s - s/p surgery.   . H/O: GI bleed     a. 60 y/o ago, r/t ulcer. Denies recent GIB.  . H/O: eczema     lower extremities  . High cholesterol   . Hypertension   . Cocaine abuse   . Tobacco abuse   . CAD (coronary artery disease)     4V CABG 10/31/13  . Myocardial infarction ([place])   . GERD (gastroesophageal reflux disease)     Assessment: 60 yo M in ED intubated and sedated.  Pharmacy consulted to dose heparin for for possible ACS, Ischemic EKG changes.  HL 0.28 on heparin 1000 units/hr. Nurse reports no issues with infusion or bleeding.  Goal of Therapy:  Heparin level 0.3-0.7 units/ml Monitor platelets by anticoagulation protocol: Yes   Plan:  Increase heparin to 1150 units/hr Check anti-Xa level in 6 hours and daily while on heparin Continue to monitor H&H and platelets   Andrey Cota. Diona Foley, PharmD,  [place] Clinical Pharmacist Pager [phone] 07/02/2015 7:05 PM

## 2015-07-02 NOTE — ED Notes (Signed)
Rocuronium 80mg iv given

## 2015-07-02 NOTE — Progress Notes (Signed)
  Echocardiogram 2D Echocardiogram has been performed.  Bobbye Charleston 07/02/2015, 12:43 PM

## 2015-07-02 NOTE — ED Provider Notes (Addendum)
CSN: [id number]     Arrival date & time 07/02/15  [id number] History   First MD Initiated Contact with Patient 07/02/15 [phone]     Chief Complaint  Patient presents with  . Respiratory Distress     (Consider location/radiation/quality/duration/timing/severity/associated sxs/prior Treatment) HPI Comments: Patient with history of COPD brought to the emergency department by EMS in severe respiratory distress. Patient reported onset of shortness of breath while at work this morning. Symptoms progressively worsened. EMS was called and they found him sitting on the toilet in the bathroom, leaning forward and tripoding. He was in severe distress initially refused transport, but then when his breathing worsened even further, he agreed to transport. EMS report room air oxygen saturation of 70%. He was administered albuterol 10 mg, Atrovent 0.5 mg, Epi 0.15 mg with no improvement. EMS reports that during transport he became unresponsive, requiring bag valve mask assisted ventilation. Level V Caveat due to acuity of condition.   Past Medical History  Diagnosis Date  . COPD (chronic obstructive pulmonary disease) ([place])   . Perforation of duodenal ulcer ([place])     a. 1970s - s/p surgery.   . H/O: GI bleed     a. 60 y/o ago, r/t ulcer. Denies recent GIB.  . H/O: eczema     lower extremities  . High cholesterol   . Hypertension   . Cocaine abuse   . Tobacco abuse   . CAD (coronary artery disease)     4V CABG 10/31/13  . Myocardial infarction ([place])   . GERD (gastroesophageal reflux disease)    Past Surgical History  Procedure Laterality Date  . Stomach sx    . Coronary artery bypass graft N/A 10/31/2013    Procedure: CORONARY ARTERY BYPASS GRAFTING TIMES FOUR ON PUMP USING LEFT INTERNAL MAMMARY ARTERY AND RIGHT GREATER SAPHENOUS VEIN VIA ENDOVEIN HARVEST.;  Surgeon: Melrose Nakayama, MD;  Location: [place];  Service: Open Heart Surgery;  Laterality: N/A;  . Intraoperative transesophageal echocardiogram N/A  10/31/2013    Procedure: INTRAOPERATIVE TRANSESOPHAGEAL ECHOCARDIOGRAM;  Surgeon: Melrose Nakayama, MD;  Location: [place];  Service: Open Heart Surgery;  Laterality: N/A;  . Left heart catheterization with coronary angiogram N/A 10/28/2013    Procedure: LEFT HEART CATHETERIZATION WITH CORONARY ANGIOGRAM;  Surgeon: Burnell Blanks, MD;  Location: [facility] CATH LAB;  Service: Cardiovascular;  Laterality: N/A;   Family History  Problem Relation Age of Onset  . Lung cancer     Social History  Substance Use Topics  . Smoking status: Current Every Day Smoker -- 0.50 packs/day for 40 years    Types: Cigarettes  . Smokeless tobacco: Never Used  . Alcohol Use: 3.6 oz/week    6 Cans of beer per week    Review of Systems  Unable to perform ROS: Acuity of condition      Allergies  Review of patient's allergies indicates no known allergies.  Home Medications   Prior to Admission medications   Medication Sig Start Date End Date Taking? Authorizing Provider  aspirin EC 325 MG EC tablet Take 1 tablet (325 mg total) by mouth daily. 11/05/13   Wayne E Gold, PA-C  atorvastatin (LIPITOR) 80 MG tablet Take 1 tablet (80 mg total) by mouth daily at 6 PM. 11/05/13   John Giovanni, PA-C  carvedilol (COREG) 12.5 MG tablet Take 1 tablet (12.5 mg total) by mouth 2 (two) times daily with a meal. 11/05/13   Wayne E Gold, PA-C  fluticasone (FLONASE) 50 MCG/ACT nasal  spray Place 2 sprays into both nostrils daily. 09/04/14   Janece Canterbury, MD  Ipratropium-Albuterol (COMBIVENT) 20-100 MCG/ACT AERS respimat Inhale 1 puff into the lungs every 6 (six) hours as needed for wheezing or shortness of breath. 09/04/14   Janece Canterbury, MD  levofloxacin (LEVAQUIN) 500 MG tablet Take 1 tablet (500 mg total) by mouth daily. 09/04/14   Janece Canterbury, MD  mometasone-formoterol (DULERA) 100-5 MCG/ACT AERO Inhale 2 puffs into the lungs 2 (two) times daily. 09/04/14   Janece Canterbury, MD  nicotine (NICODERM CQ - DOSED IN MG/24  HOURS) 14 mg/24hr patch Place 1 patch (14 mg total) onto the skin daily. 09/04/14   Janece Canterbury, MD  nicotine (NICODERM CQ - DOSED IN MG/24 HR) 7 mg/24hr patch Place 1 patch (7 mg total) onto the skin daily. 09/04/14   Janece Canterbury, MD  predniSONE (DELTASONE) 20 MG tablet Take 2 tabs daily for two days, 1 tab daily for two days, half tab daily for 2 days, then stop 09/04/14   Janece Canterbury, MD   Ht 6' (1.829 m)  Wt 183 lb 3.2 oz (83.1 kg)  BMI 24.84 kg/m2 Physical Exam  Constitutional: He appears distressed.  HENT:  Head: Normocephalic and atraumatic.  Eyes: Pupils are equal, round, and reactive to light.  Neck: Neck supple.  Cardiovascular: Regular rhythm.  Tachycardia present.  Exam reveals no gallop and no friction rub.   No murmur heard. Pulmonary/Chest: Apnea noted. He is in respiratory distress. He has decreased breath sounds (minimal air movement with bag valve mask assisted ventilation).  Abdominal: Soft.  Musculoskeletal: He exhibits no edema.  Neurological: No cranial nerve deficit or sensory deficit. GCS eye subscore is 2. GCS verbal subscore is 1. GCS motor subscore is 5.    ED Course  Procedures (including critical care time) Labs Review Labs Reviewed  CULTURE, BLOOD (ROUTINE X 2)  CULTURE, BLOOD (ROUTINE X 2)  URINE CULTURE  CBC WITH DIFFERENTIAL/PLATELET  COMPREHENSIVE METABOLIC PANEL  TROPONIN I  BRAIN NATRIURETIC PEPTIDE  URINALYSIS, ROUTINE W REFLEX MICROSCOPIC (NOT AT [facility])  URINE RAPID DRUG SCREEN, HOSP PERFORMED  I-STAT CG4 LACTIC ACID, ED    Imaging Review No results found. I have personally reviewed and evaluated these images and lab results as part of my medical decision-making.   EKG Interpretation   Date/Time:  Thursday July 02 2015 06:57:40 EST Ventricular Rate:  104 PR Interval:  149 QRS Duration: 104 QT Interval:  364 QTC Calculation: 479 R Axis:   20 Text Interpretation:  Sinus tachycardia Biatrial enlargement Borderline  low  voltage, extremity leads Abnormal T, consider ischemia, diffuse leads  No significant change since last tracing Confirmed by POLLINA  MD,  CHRISTOPHER [phone]) on 07/02/2015 7:13:22 AM      MDM   Final diagnoses:  Acute respiratory failure, unspecified whether with hypoxia or hypercapnia (HCC)  Chronic obstructive pulmonary disease, unspecified COPD type [facility])    Patient presented to the ER by EMS in severe respiratory distress. Patient does have a history of COPD as well as coronary artery disease, status post bypass surgery. Patient had denied chest pain to EMS. By the time he arrived in the ER he was nonverbal. GCS equals 8. Based on his severe distress, intubation was performed by Abigail Butts, PA-C. See separate note for procedure.  EKG shows T-wave inversions and depressions in inferior and lateral leads, however, this is chronic and baseline for him, no change from previous EKG. Patient administered Solu-Medrol, additional bronchodilator therapy. Patient will  require mission to the ICU.  CRITICAL CARE Performed by: Orpah Greek   Total critical care time: 30 minutes  Critical care time was exclusive of separately billable procedures and treating other patients.  Critical care was necessary to treat or prevent imminent or life-threatening deterioration.  Critical care was time spent personally by me on the following activities: development of treatment plan with patient and/or surrogate as well as nursing, discussions with consultants, evaluation of patient's response to treatment, examination of patient, obtaining history from patient or surrogate, ordering and performing treatments and interventions, ordering and review of laboratory studies, ordering and review of radiographic studies, pulse oximetry and re-evaluation of patient's condition.     Orpah Greek, MD 07/02/15 [place], MD 07/02/15 [phone]

## 2015-07-02 NOTE — H&P (Signed)
PULMONARY / CRITICAL CARE MEDICINE   Name: [name] MRN: [id number] DOB: 08/07/1955    ADMISSION DATE:  07/02/2015 CONSULTATION DATE: 07/02/2015  REFERRING MD:  Betsey Holiday  CHIEF COMPLAINT:  Hypoxic Respiratory Distress   HISTORY OF PRESENT ILLNESS:   60 year old male with significant medical history of COPD, CAD (CABG), cocaine and marijuana use and current smoker, was at work the am of 1/19 (at a [employer]) complaining of progressively worsening shortness of breath. Co-workers called EMS, when EMS arrived they found patient sitting on toilet in bathroom, leaning forward and tripoding. Initially refused transport to ED, but when breathing severely worsened he agreed. EMS reported oxygen saturation of 70% on room air, they administered albuterol 10 mg, Atrovent 0.5 mg, Epi 0.15 mg with no improvement. He became unresponsive on transport, and was assisted with bag valve mask ventilation. Upon arrival to ED patient was lethargic but responsive to painful stimuli. Patient as intubated by ED provider and started on a propofol infusion. PCCM to admit.   PAST MEDICAL HISTORY :  He  has a past medical history of COPD (chronic obstructive pulmonary disease) ([place]); Perforation of duodenal ulcer ([place]); H/O: GI bleed; H/O: eczema; High cholesterol; Hypertension; Cocaine abuse; Tobacco abuse; CAD (coronary artery disease); Myocardial infarction [facility]); and GERD (gastroesophageal reflux disease).  PAST SURGICAL HISTORY: He  has past surgical history that includes stomach sx; Coronary artery bypass graft (N/A, 10/31/2013); Intraoprative transesophageal echocardiogram (N/A, 10/31/2013); and left heart catheterization with coronary angiogram (N/A, 10/28/2013).  No Known Allergies  No current facility-administered medications on file prior to encounter.   Current Outpatient Prescriptions on File Prior to Encounter  Medication Sig  . aspirin EC 325 MG EC tablet Take 1 tablet (325 mg total) by mouth daily.   Marland Kitchen atorvastatin (LIPITOR) 80 MG tablet Take 1 tablet (80 mg total) by mouth daily at 6 PM.  . carvedilol (COREG) 12.5 MG tablet Take 1 tablet (12.5 mg total) by mouth 2 (two) times daily with a meal.  . fluticasone (FLONASE) 50 MCG/ACT nasal spray Place 2 sprays into both nostrils daily.  . Ipratropium-Albuterol (COMBIVENT) 20-100 MCG/ACT AERS respimat Inhale 1 puff into the lungs every 6 (six) hours as needed for wheezing or shortness of breath.  . levofloxacin (LEVAQUIN) 500 MG tablet Take 1 tablet (500 mg total) by mouth daily.  . mometasone-formoterol (DULERA) 100-5 MCG/ACT AERO Inhale 2 puffs into the lungs 2 (two) times daily.  . nicotine (NICODERM CQ - DOSED IN MG/24 HOURS) 14 mg/24hr patch Place 1 patch (14 mg total) onto the skin daily.  . nicotine (NICODERM CQ - DOSED IN MG/24 HR) 7 mg/24hr patch Place 1 patch (7 mg total) onto the skin daily.  . predniSONE (DELTASONE) 20 MG tablet Take 2 tabs daily for two days, 1 tab daily for two days, half tab daily for 2 days, then stop    FAMILY HISTORY:  His indicated that his mother is alive. He indicated that his father is deceased. He indicated that only one of his two brothers is alive.   SOCIAL HISTORY: He  reports that he has been smoking Cigarettes.  He has a 20 pack-year smoking history. He has never used smokeless tobacco. He reports that he drinks about 3.6 oz of alcohol per week. He reports that he uses illicit drugs (Marijuana and Cocaine).   REVIEW OF SYSTEMS:   Unable to review.   SUBJECTIVE:  Patient intubated and sedated.   VITAL SIGNS: BP 155/99 mmHg  Pulse  101  Resp 16  Ht 6' (1.829 m)  Wt 83.1 kg (183 lb 3.2 oz)  BMI 24.84 kg/m2  SpO2 100%  HEMODYNAMICS:    VENTILATOR SETTINGS: Vent Mode:  [-] PRVC FiO2 (%):  [40 %-60 %] 40 % Set Rate:  [16 bmp-18 bmp] 18 bmp Vt Set:  [500 mL-620 mL] 620 mL PEEP:  [5 cmH20] 5 cmH20 Plateau Pressure:  [17 cmH20] 17 cmH20  INTAKE / OUTPUT:    PHYSICAL  EXAMINATION: General:  Critically ill male, intubated and sedated.  Neuro: Sedated, responds to painful stimuli   HEENT:  ETT in place, 1 mm pupils bilaterally and non-reactive  Cardiovascular:  Regular rate and rhythm, no edema  Lungs:  Expiatory wheezes throughout, regular pattern, not using accessory muscles  Abdomen:  Bowel sounds present, flat and non-distended abdomen, NG tube in place.  Musculoskeletal:  No joint deformities noted  Skin:  Cool, dry and intact  LABS:  BMET  Recent Labs Lab 07/02/15 0705  NA 139  K 3.9  CL 106  CO2 25  BUN 10  CREATININE 1.21  GLUCOSE 213*    Electrolytes  Recent Labs Lab 07/02/15 0705  CALCIUM 8.4*    CBC  Recent Labs Lab 07/02/15 0705  WBC 8.4  HGB 13.9  HCT 42.0  PLT 319    Coag's No results for input(s): APTT, INR in the last 168 hours.  Sepsis Markers  Recent Labs Lab 07/02/15 0721  LATICACIDVEN 0.98    ABG  Recent Labs Lab 07/02/15 0742  PHART 7.193*  PCO2ART 75.3*  PO2ART 262.0*    Liver Enzymes  Recent Labs Lab 07/02/15 0705  AST 24  ALT 18  ALKPHOS 110  BILITOT 0.5  ALBUMIN 3.6    Cardiac Enzymes  Recent Labs Lab 07/02/15 0705  TROPONINI <0.03    Glucose No results for input(s): GLUCAP in the last 168 hours.  Imaging Dg Chest Port 1 View  07/02/2015  CLINICAL DATA:  Shortness of breath, intubation, worsening respiratory distress this morning, COPD, coronary artery disease post MI and CABG, hypertension EXAM: PORTABLE CHEST 1 VIEW COMPARISON:  Portable exam 0703 hours compared to 09/01/2014 FINDINGS: Tip of endotracheal tube projects approximately 7.2 cm above carina. Nasogastric tube extends into stomach. Normal heart size post CABG. Mediastinal contours and pulmonary vascularity normal. Emphysematous changes without infiltrate, pleural effusion or pneumothorax. No acute osseous findings. IMPRESSION: Tube positions as above. COPD changes and evidence of prior CABG. No acute  abnormalities. Electronically Signed   By: Lavonia Dana M.D.   On: 07/02/2015 07:21     STUDIES:  Chest Xray 07/02/15: Emphysematous changes without infiltrate, pleural effusion, or pneumothorax, COPD changes and evidence of prior CABG, no acute abnormalities.   CULTURES: Urine Culture 06/30/2015 >>> Blood Culture x 2 06/30/15 >>> Sputum Culture 06/30/15 rvp 1/19>>>  ANTIBIOTICS: Azithromycin 500 mg 07/02/2015 >>> Rocephin 1 gm 07/02/2015 >>> Tamiflu 07/02/2015>>>>  LINES/TUBES: ETT 06/30/2015 >>> Left [place] CVL 1/19>>>  DISCUSSION: 59 year old critically ill male with sig h/o COPD, CAD(CABG, and illicit drug use. Presenting with severe hypoxic respiratory failure from work. Acute COPD exacerbation, R/O flu and CAP. Patient has been PAN cultured and started Azithromycin and Rocephin ,as well as tamiflu. Became hypotensive in ED.Marland Kitchen Had some ST depression. Central line placed. Unclear if this is d/t propofol, hypovolemia, or cardiac in origin. Will ck CVP, recheck abg, continue fluid resuscitation and add levophed.. Will admit to ICU.   ASSESSMENT / PLAN:  PULMONARY A: Acute  Hypoxic Respiratory Failure  In setting of Acute COPD Exacerbation  R/O Flu  P:   Solumedrol 60 mg q 6 hours Tamiflu 75 mg BID  Duoneb 3 mL q 4 hours Albuterol 2.5 mg q 2 hours prn  RVP Panel Full Vent Support PAD protocol  Repeat ABG  CARDIOVASCULAR A:  Circulatory Shock-->etiology unclear: possibly medication vs cardiac vs relative hypovolemia-->does not meet SIRS criteria so sepsis seems unlikely  Negative initial Cardiac Enzymes but has ST depression w/ + cocaine in UDS H/O Hyperlipidemia  H/O HTN, CAD, MI, CABG  P:  Telemetry  Cycle CEs Cards consulted  Hold Home BP meds  See ID section   RENAL A:   No acute at risk related to critical illness P:   Cont IVFs - NS @ 100 ml/hr Foley Catheter for Strict I & O Avoid hypotension  Trend BMP  GASTROINTESTINAL A:   H/O GERD Risk for critical  illness related malnutrition  P:   Protonix 40 mg q 24 hours  Consult nutrition  HEMATOLOGIC A:   No acute, at risk for related to critical illness  P:  Heparin sq q 8 hours  Trend CBC  Transfuse per ICU protocol   INFECTIOUS A:   R/O Flu  R/O CAP Afebrile  No Leukocytosis  P:   Azithromycin 500 mg 07/02/2015 >>> Rocephin 1 gm 07/02/2015 >>> Urine Culture 07/02/2015>>> Blood Culture 07/02/15>>> Sputum Culture 07/02/2015>>> Droplet Precautions - Pending RVP RVP 1/19>>>  ENDOCRINE A:   Hyperglycemia  P:   Q 4 CBG w/ssi   NEUROLOGIC A:   No acute P:   PAD protocol  fent gtt and try to wean off prop given hypotension  RASS goal: -2   FAMILY  - Updates: pending.   - Inter-disciplinary family meet or Palliative Care meeting due by:  07/09/2015  Erick Colace ACNP-BC [place] Pager # [phone] OR # [phone] if no answer  07/02/2015, 8:25 AM  Attending Note:  60 year old male with history of COPD who was at work when he started having SOB and was tripoding. Patient refused to go to the hospital but EMS was called and he again refused. Once he lost consciousness EMS brought him in bagging him. In the ED, he was intubated and PCCM was called for admission. Diffuse wheezing on exam. CXR that I refused myself without evidence of acute disease, hyperinflation. Will admit to the ICU, no beta blockers (cocaine positive), full vent support, adjust vent, respiratory viral panel, rocephin/zithromax/tamiflu ordered, pan culture, bronchodilators and steroids ordered. TF per nutrition. F/U ABG.  The patient is critically ill with multiple organ systems failure and requires high complexity decision making for assessment and support, frequent evaluation and titration of therapies, application of advanced monitoring technologies and extensive interpretation of multiple databases.   Critical Care Time devoted to patient care services described in this note  is 35 Minutes. This time reflects time of care of this signee Dr Jennet Maduro. This critical care time does not reflect procedure time, or teaching time or supervisory time of PA/NP/Med student/Med Resident etc but could involve care discussion time.  Rush Farmer, M.D. [facility] Pulmonary/Critical Care Medicine. Pager: [phone]. After hours pager: [phone].

## 2015-07-03 ENCOUNTER — Inpatient Hospital Stay (HOSPITAL_COMMUNITY): Payer: Self-pay

## 2015-07-03 DIAGNOSIS — J438 Other emphysema: Secondary | ICD-10-CM

## 2015-07-03 DIAGNOSIS — I427 Cardiomyopathy due to drug and external agent: Secondary | ICD-10-CM

## 2015-07-03 LAB — HIV ANTIBODY (ROUTINE TESTING W REFLEX): HIV Screen 4th Generation wRfx: NONREACTIVE

## 2015-07-03 LAB — RESPIRATORY VIRUS PANEL
Adenovirus: NEGATIVE
Influenza A: NEGATIVE
Influenza B: NEGATIVE
Metapneumovirus: NEGATIVE
PARAINFLUENZA 2 A: NEGATIVE
PARAINFLUENZA 3 A: NEGATIVE
Parainfluenza 1: NEGATIVE
RESPIRATORY SYNCYTIAL VIRUS A: NEGATIVE
RESPIRATORY SYNCYTIAL VIRUS B: NEGATIVE
RHINOVIRUS: NEGATIVE

## 2015-07-03 LAB — BLOOD GAS, ARTERIAL
ACID-BASE DEFICIT: 0.9 mmol/L (ref 0.0–2.0)
Bicarbonate: 24.4 mEq/L — ABNORMAL HIGH (ref 20.0–24.0)
Drawn by: 41977
FIO2: 0.4
MECHVT: 620 mL
O2 Saturation: 89.3 %
PEEP/CPAP: 5 cmH2O
PH ART: 7.323 — AB (ref 7.350–7.450)
PO2 ART: 61.1 mmHg — AB (ref 80.0–100.0)
Patient temperature: 98.5
RATE: 18 resp/min
TCO2: 25.9 mmol/L (ref 0–100)
pCO2 arterial: 48.4 mmHg — ABNORMAL HIGH (ref 35.0–45.0)

## 2015-07-03 LAB — MAGNESIUM
Magnesium: 1.8 mg/dL (ref 1.7–2.4)
Magnesium: 1.9 mg/dL (ref 1.7–2.4)

## 2015-07-03 LAB — BASIC METABOLIC PANEL
Anion gap: 7 (ref 5–15)
BUN: 12 mg/dL (ref 6–20)
CALCIUM: 8.2 mg/dL — AB (ref 8.9–10.3)
CO2: 26 mmol/L (ref 22–32)
CREATININE: 1.23 mg/dL (ref 0.61–1.24)
Chloride: 109 mmol/L (ref 101–111)
GFR calc non Af Amer: >60 mL/min (ref >60)
Glucose, Bld: 135 mg/dL — ABNORMAL HIGH (ref 65–99)
Potassium: 3.9 mmol/L (ref 3.5–5.1)
SODIUM: 142 mmol/L (ref 135–145)

## 2015-07-03 LAB — CBC
HEMATOCRIT: 36 % — AB (ref 39.0–52.0)
Hemoglobin: 11.6 g/dL — ABNORMAL LOW (ref 13.0–17.0)
MCH: 28.7 pg (ref 26.0–34.0)
MCHC: 32.2 g/dL (ref 30.0–36.0)
MCV: 89.1 fL (ref 78.0–100.0)
PLATELETS: 259 10*3/uL (ref 150–400)
RBC: 4.04 MIL/uL — ABNORMAL LOW (ref 4.22–5.81)
RDW: 15.6 % — AB (ref 11.5–15.5)
WBC: 8.3 10*3/uL (ref 4.0–10.5)

## 2015-07-03 LAB — PHOSPHORUS
PHOSPHORUS: 3.1 mg/dL (ref 2.5–4.6)
PHOSPHORUS: 2 mg/dL — AB (ref 2.5–4.6)

## 2015-07-03 LAB — GLUCOSE, CAPILLARY
GLUCOSE-CAPILLARY: 135 mg/dL — AB (ref 65–99)
GLUCOSE-CAPILLARY: 123 mg/dL — AB (ref 65–99)
Glucose-Capillary: 120 mg/dL — ABNORMAL HIGH (ref 65–99)
Glucose-Capillary: 123 mg/dL — ABNORMAL HIGH (ref 65–99)

## 2015-07-03 LAB — URINE CULTURE
CULTURE: NO GROWTH
Culture: 1000

## 2015-07-03 LAB — TROPONIN I
Troponin I: <0.03 ng/mL (ref <0.031)
Troponin I: <0.03 ng/mL (ref <0.031)
Troponin I: <0.03 ng/mL (ref <0.031)

## 2015-07-03 LAB — PROCALCITONIN: Procalcitonin: 0.21 ng/mL

## 2015-07-03 LAB — HEPARIN LEVEL (UNFRACTIONATED)
HEPARIN UNFRACTIONATED: 0.56 [IU]/mL (ref 0.30–0.70)
Heparin Unfractionated: 0.68 IU/mL (ref 0.30–0.70)

## 2015-07-03 MED ORDER — ASPIRIN 81 MG PO CHEW
81.0000 mg | CHEWABLE_TABLET | Freq: Every day | ORAL | Status: DC
  Administered 2015-07-03 – 2015-07-06 (×4): 81 mg via ORAL
  Filled 2015-07-03 (×4): qty 1

## 2015-07-03 MED ORDER — FENTANYL CITRATE (PF) 100 MCG/2ML IJ SOLN: 12.5000 ug | INTRAMUSCULAR | Status: DC | PRN

## 2015-07-03 MED ORDER — POTASSIUM PHOSPHATES 15 MMOLE/5ML IV SOLN
20.0000 meq | Freq: Once | INTRAVENOUS | Status: AC
  Administered 2015-07-03: 20 meq via INTRAVENOUS
  Filled 2015-07-03: qty 4.55

## 2015-07-03 MED ORDER — FUROSEMIDE 10 MG/ML IJ SOLN
40.0000 mg | Freq: Every day | INTRAMUSCULAR | Status: DC
  Administered 2015-07-03 – 2015-07-05 (×3): 40 mg via INTRAVENOUS
  Filled 2015-07-03 (×5): qty 4

## 2015-07-03 MED ORDER — OSELTAMIVIR PHOSPHATE 75 MG PO CAPS
75.0000 mg | ORAL_CAPSULE | Freq: Two times a day (BID) | ORAL | Status: DC
  Administered 2015-07-03: 75 mg via ORAL
  Filled 2015-07-03 (×3): qty 1

## 2015-07-03 MED ORDER — VITAL AF 1.2 CAL PO LIQD: 1000.0000 mL | ORAL | Status: DC

## 2015-07-03 MED ORDER — VITAL HIGH PROTEIN PO LIQD: 1000.0000 mL | ORAL | Status: DC

## 2015-07-03 MED ORDER — PRO-STAT SUGAR FREE PO LIQD
30.0000 mL | Freq: Two times a day (BID) | ORAL | Status: DC
  Filled 2015-07-03 (×2): qty 30

## 2015-07-03 NOTE — Progress Notes (Addendum)
DAILY PROGRESS NOTE  Subjective:  Remains intubated on the ventilator. Echo yesterday shows EF of 35-40% with diffuse hypokinesis and severe hypokinesis of the apex with grade 2 diastolic dysfunction and high LV filling pressure. Despite significant EKG changes suggestive of ischemia, troponin remained negative. BNP was actually very low at 178. No EKG was performed today.  Objective:  Temp:  [95.5 F (35.3 C)-99 F (37.2 C)] 99 F (37.2 C) (01/20 1205) Pulse Rate:  [47-82] 62 (01/20 1100) Resp:  [16-19] 19 (01/20 1100) BP: (92-139)/(50-83) 106/54 mmHg (01/20 1100) SpO2:  [90 %-100 %] 99 % (01/20 1109) FiO2 (%):  [40 %] 40 % (01/20 1109) Weight:  [189 lb 13.1 oz (86.1 kg)] 189 lb 13.1 oz (86.1 kg) (01/20 0339) Weight change: 6 lb 9.8 oz (3 kg)  Intake/Output from previous day: 01/19 0701 - 01/20 0700 In: 5264.3 [I.V.:5214.3; NG/GT:50] Out: 2210 [Urine:1010; Emesis/NG output:400]  Intake/Output from this shift: Total I/O In: -  Out: 80 [Urine:80]  Medications: Current Facility-Administered Medications  Medication Dose Route Frequency Provider Last Rate Last Dose  . 0.9 %  sodium chloride infusion   Intravenous Continuous Rush Farmer, MD 100 mL/hr at 07/03/15 0948    . albuterol (PROVENTIL) (2.5 MG/3ML) 0.083% nebulizer solution 2.5 mg  2.5 mg Nebulization Q2H PRN Rush Farmer, MD      . antiseptic oral rinse solution (CORINZ)  7 mL Mouth Rinse QID Rush Farmer, MD   7 mL at 07/03/15 0338  . azithromycin (ZITHROMAX) 500 mg in dextrose 5 % 250 mL IVPB  500 mg Intravenous Q24H Rush Farmer, MD 250 mL/hr at 07/03/15 0836 500 mg at 07/03/15 0836  . cefTRIAXone (ROCEPHIN) 1 g in dextrose 5 % 50 mL IVPB  1 g Intravenous Q24H Rush Farmer, MD 100 mL/hr at 07/03/15 0755 1 g at 07/03/15 0755  . chlorhexidine gluconate (PERIDEX) 0.12 % solution 15 mL  15 mL Mouth Rinse BID Rush Farmer, MD   15 mL at 07/03/15 0807  . Chlorhexidine Gluconate Cloth 2 % PADS 6 each  6 each  Topical Q0600 Rush Farmer, MD   6 each at 07/03/15 0600  . feeding supplement (PRO-STAT SUGAR FREE 64) liquid 30 mL  30 mL Per Tube BID Reanne J Barbato, RD      . feeding supplement (VITAL AF 1.2 CAL) liquid 1,000 mL  1,000 mL Per Tube Continuous Reanne J Barbato, RD      . fentaNYL (SUBLIMAZE) 2,500 mcg in sodium chloride 0.9 % 250 mL (10 mcg/mL) infusion  25-400 mcg/hr Intravenous Continuous Rush Farmer, MD   Stopped at 07/03/15 1030  . fentaNYL (SUBLIMAZE) injection   Intravenous PRN Rush Farmer, MD   100 mcg at 07/02/15 0855  . heparin ADULT infusion 100 units/mL (25000 units/250 mL)  1,150 Units/hr Intravenous Continuous Rebecka Apley, RPH 11.5 mL/hr at 07/03/15 0725 1,150 Units/hr at 07/03/15 0725  . ipratropium-albuterol (DUONEB) 0.5-2.5 (3) MG/3ML nebulizer solution 3 mL  3 mL Nebulization Q4H Rush Farmer, MD   3 mL at 07/03/15 1109  . methylPREDNISolone sodium succinate (SOLU-MEDROL) 125 mg/2 mL injection 60 mg  60 mg Intravenous Q6H Rush Farmer, MD   60 mg at 07/03/15 0755  . mupirocin ointment (BACTROBAN) 2 % 1 application  1 application Nasal BID Rush Farmer, MD   1 application at 20/35/59 1034  . oseltamivir (TAMIFLU) 6 MG/ML suspension 75 mg  75 mg Per Tube  BID Rush Farmer, MD   75 mg at 07/03/15 1033  . pantoprazole (PROTONIX) injection 40 mg  40 mg Intravenous Q24H Rush Farmer, MD   40 mg at 07/03/15 0755  . propofol (DIPRIVAN) 1000 MG/100ML infusion  5-70 mcg/kg/min Intravenous Continuous Rush Farmer, MD   Stopped at 07/03/15 1030    Physical Exam: General appearance: alert and intubated, no distress Neck: JVD - 3 cm above sternal notch and no carotid bruit Lungs: diminished breath sounds bilaterally Heart: regular rate and rhythm, S1, S2 normal, no murmur, click, rub or gallop Abdomen: soft, non-tender; bowel sounds normal; no masses,  no organomegaly Extremities: extremities normal, atraumatic, no cyanosis or edema Pulses: 2+ and symmetric Skin:  Skin color, texture, turgor normal. No rashes or lesions Neurologic: Mental status: Alert, follows commands .  Lab Results: Results for orders placed or performed during the hospital encounter of 07/02/15 (from the past 48 hour(s))  CBC with Differential/Platelet     Status: Abnormal   Collection Time: 07/02/15  7:05 AM  Result Value Ref Range   WBC 8.4 4.0 - 10.5 K/uL   RBC 4.77 4.22 - 5.81 MIL/uL   Hemoglobin 13.9 13.0 - 17.0 g/dL   HCT 42.0 39.0 - 52.0 %   MCV 88.1 78.0 - 100.0 fL   MCH 29.1 26.0 - 34.0 pg   MCHC 33.1 30.0 - 36.0 g/dL   RDW 15.2 11.5 - 15.5 %   Platelets 319 150 - 400 K/uL   Neutrophils Relative % 37 %   Neutro Abs 3.1 1.7 - 7.7 K/uL   Lymphocytes Relative 43 %   Lymphs Abs 3.6 0.7 - 4.0 K/uL   Monocytes Relative 9 %   Monocytes Absolute 0.8 0.1 - 1.0 K/uL   Eosinophils Relative 10 %   Eosinophils Absolute 0.9 (H) 0.0 - 0.7 K/uL   Basophils Relative 1 %   Basophils Absolute 0.0 0.0 - 0.1 K/uL  Comprehensive metabolic panel     Status: Abnormal   Collection Time: 07/02/15  7:05 AM  Result Value Ref Range   Sodium 139 135 - 145 mmol/L   Potassium 3.9 3.5 - 5.1 mmol/L   Chloride 106 101 - 111 mmol/L   CO2 25 22 - 32 mmol/L   Glucose, Bld 213 (H) 65 - 99 mg/dL   BUN 10 6 - 20 mg/dL   Creatinine, Ser 1.21 0.61 - 1.24 mg/dL   Calcium 8.4 (L) 8.9 - 10.3 mg/dL   Total Protein 6.6 6.5 - 8.1 g/dL   Albumin 3.6 3.5 - 5.0 g/dL   AST 24 15 - 41 U/L   ALT 18 17 - 63 U/L   Alkaline Phosphatase 110 38 - 126 U/L   Total Bilirubin 0.5 0.3 - 1.2 mg/dL   GFR calc non Af Amer >60 >60 mL/min   GFR calc Af Amer >60 >60 mL/min    Comment: (NOTE) The eGFR has been calculated using the CKD EPI equation. This calculation has not been validated in all clinical situations. eGFR's persistently <60 mL/min signify possible Chronic Kidney Disease.    Anion gap 8 5 - 15  Troponin I     Status: None   Collection Time: 07/02/15  7:05 AM  Result Value Ref Range   Troponin I  <0.03 <0.031 ng/mL    Comment:        NO INDICATION OF MYOCARDIAL INJURY.   Brain natriuretic peptide     Status: Abnormal   Collection Time: 07/02/15  7:05 AM  Result Value Ref Range   B Natriuretic Peptide 253.3 (H) 0.0 - 100.0 pg/mL  Urinalysis, Routine w reflex microscopic (not at [facility])     Status: Abnormal   Collection Time: 07/02/15  7:19 AM  Result Value Ref Range   Color, Urine YELLOW YELLOW   APPearance CLEAR CLEAR   Specific Gravity, Urine 1.022 1.005 - 1.030   pH 5.5 5.0 - 8.0   Glucose, UA 100 (A) NEGATIVE mg/dL   Hgb urine dipstick SMALL (A) NEGATIVE   Bilirubin Urine NEGATIVE NEGATIVE   Ketones, ur NEGATIVE NEGATIVE mg/dL   Protein, ur 100 (A) NEGATIVE mg/dL   Nitrite NEGATIVE NEGATIVE   Leukocytes, UA NEGATIVE NEGATIVE  Urine rapid drug screen (hosp performed)     Status: Abnormal   Collection Time: 07/02/15  7:19 AM  Result Value Ref Range   Opiates NONE DETECTED NONE DETECTED   Cocaine POSITIVE (A) NONE DETECTED   Benzodiazepines NONE DETECTED NONE DETECTED   Amphetamines NONE DETECTED NONE DETECTED   Tetrahydrocannabinol POSITIVE (A) NONE DETECTED   Barbiturates NONE DETECTED NONE DETECTED    Comment:        DRUG SCREEN FOR MEDICAL PURPOSES ONLY.  IF CONFIRMATION IS NEEDED FOR ANY PURPOSE, NOTIFY LAB WITHIN 5 DAYS.        LOWEST DETECTABLE LIMITS FOR URINE DRUG SCREEN Drug Class       Cutoff (ng/mL) Amphetamine      1000 Barbiturate      200 Benzodiazepine   657 Tricyclics       903 Opiates          300 Cocaine          300 THC              50   Urine culture     Status: None   Collection Time: 07/02/15  7:19 AM  Result Value Ref Range   Specimen Description URINE, CATHETERIZED    Special Requests NONE    Culture 1,000 COLONIES/mL INSIGNIFICANT GROWTH    Report Status 07/03/2015 FINAL   Urine microscopic-add on     Status: Abnormal   Collection Time: 07/02/15  7:19 AM  Result Value Ref Range   Squamous Epithelial / LPF 0-5 (A) NONE SEEN    WBC, UA 0-5 0 - 5 WBC/hpf   RBC / HPF 6-30 0 - 5 RBC/hpf   Bacteria, UA RARE (A) NONE SEEN   Casts HYALINE CASTS (A) NEGATIVE  I-Stat CG4 Lactic Acid, ED     Status: None   Collection Time: 07/02/15  7:21 AM  Result Value Ref Range   Lactic Acid, Venous 0.98 0.5 - 2.0 mmol/L  I-Stat arterial blood gas, ED     Status: Abnormal   Collection Time: 07/02/15  7:42 AM  Result Value Ref Range   pH, Arterial 7.193 (LL) 7.350 - 7.450   pCO2 arterial 75.3 (HH) 35.0 - 45.0 mmHg   pO2, Arterial 262.0 (H) 80.0 - 100.0 mmHg   Bicarbonate 29.2 (H) 20.0 - 24.0 mEq/L   TCO2 31 0 - 100 mmol/L   O2 Saturation 100.0 %   Acid-base deficit 1.0 0.0 - 2.0 mmol/L   Patient temperature 97.5 F    Collection site RADIAL, ALLEN'S TEST ACCEPTABLE    Sample type ARTERIAL    Comment NOTIFIED PHYSICIAN   Urinalysis, Routine w reflex microscopic (not at [facility])     Status: Abnormal   Collection Time: 07/02/15 10:06 AM  Result Value Ref  Range   Color, Urine YELLOW YELLOW   APPearance CLEAR CLEAR   Specific Gravity, Urine 1.025 1.005 - 1.030   pH 5.5 5.0 - 8.0   Glucose, UA NEGATIVE NEGATIVE mg/dL   Hgb urine dipstick TRACE (A) NEGATIVE   Bilirubin Urine NEGATIVE NEGATIVE   Ketones, ur NEGATIVE NEGATIVE mg/dL   Protein, ur 100 (A) NEGATIVE mg/dL   Nitrite NEGATIVE NEGATIVE   Leukocytes, UA NEGATIVE NEGATIVE  Urine culture     Status: None   Collection Time: 07/02/15 10:06 AM  Result Value Ref Range   Specimen Description URINE, CATHETERIZED    Special Requests NONE    Culture NO GROWTH 1 DAY    Report Status 07/03/2015 FINAL   Urine microscopic-add on     Status: Abnormal   Collection Time: 07/02/15 10:06 AM  Result Value Ref Range   Squamous Epithelial / LPF 6-30 (A) NONE SEEN   WBC, UA 6-30 0 - 5 WBC/hpf   RBC / HPF 0-5 0 - 5 RBC/hpf   Bacteria, UA FEW (A) NONE SEEN   Casts GRANULAR CAST (A) NEGATIVE    Comment: HYALINE CASTS   Urine-Other MUCOUS PRESENT   Troponin I     Status: None   Collection  Time: 07/02/15 11:23 AM  Result Value Ref Range   Troponin I <0.03 <0.031 ng/mL    Comment:        NO INDICATION OF MYOCARDIAL INJURY.   CBC with Differential/Platelet     Status: Abnormal   Collection Time: 07/02/15 11:34 AM  Result Value Ref Range   WBC 6.7 4.0 - 10.5 K/uL   RBC 4.19 (L) 4.22 - 5.81 MIL/uL   Hemoglobin 12.1 (L) 13.0 - 17.0 g/dL   HCT 37.2 (L) 39.0 - 52.0 %   MCV 88.8 78.0 - 100.0 fL   MCH 28.9 26.0 - 34.0 pg   MCHC 32.5 30.0 - 36.0 g/dL   RDW 15.2 11.5 - 15.5 %   Platelets 272 150 - 400 K/uL   Neutrophils Relative % 79 %   Neutro Abs 5.2 1.7 - 7.7 K/uL   Lymphocytes Relative 13 %   Lymphs Abs 0.9 0.7 - 4.0 K/uL   Monocytes Relative 5 %   Monocytes Absolute 0.3 0.1 - 1.0 K/uL   Eosinophils Relative 3 %   Eosinophils Absolute 0.2 0.0 - 0.7 K/uL   Basophils Relative 0 %   Basophils Absolute 0.0 0.0 - 0.1 K/uL  Comprehensive metabolic panel     Status: Abnormal   Collection Time: 07/02/15 11:34 AM  Result Value Ref Range   Sodium 141 135 - 145 mmol/L   Potassium 4.1 3.5 - 5.1 mmol/L   Chloride 106 101 - 111 mmol/L   CO2 26 22 - 32 mmol/L   Glucose, Bld 162 (H) 65 - 99 mg/dL   BUN 9 6 - 20 mg/dL   Creatinine, Ser 1.25 (H) 0.61 - 1.24 mg/dL   Calcium 8.1 (L) 8.9 - 10.3 mg/dL   Total Protein 5.7 (L) 6.5 - 8.1 g/dL   Albumin 3.1 (L) 3.5 - 5.0 g/dL   AST 20 15 - 41 U/L   ALT 17 17 - 63 U/L   Alkaline Phosphatase 89 38 - 126 U/L   Total Bilirubin 0.5 0.3 - 1.2 mg/dL   GFR calc non Af Amer >60 >60 mL/min   GFR calc Af Amer >60 >60 mL/min    Comment: (NOTE) The eGFR has been calculated using the CKD EPI equation. This  calculation has not been validated in all clinical situations. eGFR's persistently <60 mL/min signify possible Chronic Kidney Disease.    Anion gap 9 5 - 15  APTT     Status: Abnormal   Collection Time: 07/02/15 11:34 AM  Result Value Ref Range   aPTT 39 (H) 24 - 37 seconds    Comment:        IF BASELINE aPTT IS ELEVATED, SUGGEST  PATIENT RISK ASSESSMENT BE USED TO DETERMINE APPROPRIATE ANTICOAGULANT THERAPY.   Protime-INR     Status: None   Collection Time: 07/02/15 11:34 AM  Result Value Ref Range   Prothrombin Time 14.1 11.6 - 15.2 seconds   INR 1.07 0.00 - 1.49  Lactic acid, plasma     Status: None   Collection Time: 07/02/15 11:34 AM  Result Value Ref Range   Lactic Acid, Venous 0.7 0.5 - 2.0 mmol/L  Brain natriuretic peptide     Status: Abnormal   Collection Time: 07/02/15 11:34 AM  Result Value Ref Range   B Natriuretic Peptide 178.8 (H) 0.0 - 100.0 pg/mL  Glucose, capillary     Status: Abnormal   Collection Time: 07/02/15 12:09 PM  Result Value Ref Range   Glucose-Capillary 127 (H) 65 - 99 mg/dL  MRSA PCR Screening     Status: Abnormal   Collection Time: 07/02/15 12:10 PM  Result Value Ref Range   MRSA by PCR POSITIVE (A) NEGATIVE    Comment:        The GeneXpert MRSA Assay (FDA approved for NASAL specimens only), is one component of a comprehensive MRSA colonization surveillance program. It is not intended to diagnose MRSA infection nor to guide or monitor treatment for MRSA infections. RESULT CALLED TO, READ BACK BY AND VERIFIED WITH: P MCKINNEY,RN AT 1409 07/02/15 BY L BENFIELD   Triglycerides     Status: None   Collection Time: 07/02/15 12:41 PM  Result Value Ref Range   Triglycerides 102 <150 mg/dL  Culture, respiratory (NON-Expectorated)     Status: None (Preliminary result)   Collection Time: 07/02/15  3:29 PM  Result Value Ref Range   Specimen Description TRACHEAL ASPIRATE    Special Requests NONE    Gram Stain      MODERATE WBC PRESENT,BOTH PMN AND MONONUCLEAR FEW SQUAMOUS EPITHELIAL CELLS PRESENT FEW GRAM POSITIVE COCCI IN PAIRS RARE GRAM NEGATIVE RODS Performed at [facility]    Culture      Culture reincubated for better growth Performed at [facility]    Report Status PENDING   Troponin I     Status: None   Collection Time: 07/02/15  3:30 PM    Result Value Ref Range   Troponin I <0.03 <0.031 ng/mL    Comment:        NO INDICATION OF MYOCARDIAL INJURY.   Heparin level (unfractionated)     Status: Abnormal   Collection Time: 07/02/15  6:43 PM  Result Value Ref Range   Heparin Unfractionated 0.28 (L) 0.30 - 0.70 IU/mL    Comment:        IF HEPARIN RESULTS ARE BELOW EXPECTED VALUES, AND PATIENT DOSAGE HAS BEEN CONFIRMED, SUGGEST FOLLOW UP TESTING OF ANTITHROMBIN III LEVELS.   Troponin I     Status: None   Collection Time: 07/02/15  9:42 PM  Result Value Ref Range   Troponin I <0.03 <0.031 ng/mL    Comment:        NO INDICATION OF MYOCARDIAL INJURY.   I-STAT 3,  arterial blood gas (G3+)     Status: Abnormal   Collection Time: 07/02/15 10:25 PM  Result Value Ref Range   pH, Arterial 7.308 (L) 7.350 - 7.450   pCO2 arterial 48.8 (H) 35.0 - 45.0 mmHg   pO2, Arterial 128.0 (H) 80.0 - 100.0 mmHg   Bicarbonate 24.4 (H) 20.0 - 24.0 mEq/L   TCO2 26 0 - 100 mmol/L   O2 Saturation 99.0 %   Acid-base deficit 2.0 0.0 - 2.0 mmol/L   Patient temperature 98.6 F    Collection site RADIAL, ALLEN'S TEST ACCEPTABLE    Drawn by Operator    Sample type ARTERIAL   Glucose, capillary     Status: Abnormal   Collection Time: 07/03/15 12:01 AM  Result Value Ref Range   Glucose-Capillary 120 (H) 65 - 99 mg/dL  Troponin I     Status: None   Collection Time: 07/03/15  3:30 AM  Result Value Ref Range   Troponin I <0.03 <0.031 ng/mL    Comment:        NO INDICATION OF MYOCARDIAL INJURY.   CBC     Status: Abnormal   Collection Time: 07/03/15  3:30 AM  Result Value Ref Range   WBC 8.3 4.0 - 10.5 K/uL   RBC 4.04 (L) 4.22 - 5.81 MIL/uL   Hemoglobin 11.6 (L) 13.0 - 17.0 g/dL   HCT 36.0 (L) 39.0 - 52.0 %   MCV 89.1 78.0 - 100.0 fL   MCH 28.7 26.0 - 34.0 pg   MCHC 32.2 30.0 - 36.0 g/dL   RDW 15.6 (H) 11.5 - 15.5 %   Platelets 259 150 - 400 K/uL  Basic metabolic panel     Status: Abnormal   Collection Time: 07/03/15  3:30 AM  Result  Value Ref Range   Sodium 142 135 - 145 mmol/L   Potassium 3.9 3.5 - 5.1 mmol/L   Chloride 109 101 - 111 mmol/L   CO2 26 22 - 32 mmol/L   Glucose, Bld 135 (H) 65 - 99 mg/dL   BUN 12 6 - 20 mg/dL   Creatinine, Ser 1.23 0.61 - 1.24 mg/dL   Calcium 8.2 (L) 8.9 - 10.3 mg/dL   GFR calc non Af Amer >60 >60 mL/min   GFR calc Af Amer >60 >60 mL/min    Comment: (NOTE) The eGFR has been calculated using the CKD EPI equation. This calculation has not been validated in all clinical situations. eGFR's persistently <60 mL/min signify possible Chronic Kidney Disease.    Anion gap 7 5 - 15  Magnesium     Status: None   Collection Time: 07/03/15  3:30 AM  Result Value Ref Range   Magnesium 1.8 1.7 - 2.4 mg/dL  Phosphorus     Status: None   Collection Time: 07/03/15  3:30 AM  Result Value Ref Range   Phosphorus 3.1 2.5 - 4.6 mg/dL  Heparin level (unfractionated)     Status: None   Collection Time: 07/03/15  3:30 AM  Result Value Ref Range   Heparin Unfractionated 0.56 0.30 - 0.70 IU/mL    Comment:        IF HEPARIN RESULTS ARE BELOW EXPECTED VALUES, AND PATIENT DOSAGE HAS BEEN CONFIRMED, SUGGEST FOLLOW UP TESTING OF ANTITHROMBIN III LEVELS.   Procalcitonin     Status: None   Collection Time: 07/03/15  3:30 AM  Result Value Ref Range   Procalcitonin 0.21 ng/mL    Comment:        Interpretation: PCT (  Procalcitonin) <= 0.5 ng/mL: Systemic infection (sepsis) is not likely. Local bacterial infection is possible. (NOTE)         ICU PCT Algorithm               Non ICU PCT Algorithm    ----------------------------     ------------------------------         PCT < 0.25 ng/mL                 PCT < 0.1 ng/mL     Stopping of antibiotics            Stopping of antibiotics       strongly encouraged.               strongly encouraged.    ----------------------------     ------------------------------       PCT level decrease by               PCT < 0.25 ng/mL       >= 80% from peak PCT       OR  PCT 0.25 - 0.5 ng/mL          Stopping of antibiotics                                             encouraged.     Stopping of antibiotics           encouraged.    ----------------------------     ------------------------------       PCT level decrease by              PCT >= 0.25 ng/mL       < 80% from peak PCT        AND PCT >= 0.5 ng/mL            Continuin g antibiotics                                              encouraged.       Continuing antibiotics            encouraged.    ----------------------------     ------------------------------     PCT level increase compared          PCT > 0.5 ng/mL         with peak PCT AND          PCT >= 0.5 ng/mL             Escalation of antibiotics                                          strongly encouraged.      Escalation of antibiotics        strongly encouraged.   Blood gas, arterial     Status: Abnormal   Collection Time: 07/03/15  3:56 AM  Result Value Ref Range   FIO2 0.40    Delivery systems VENTILATOR    Mode PRESSURE REGULATED VOLUME CONTROL    VT 620 mL   LHR 18 resp/min   Peep/cpap 5.0 cm H20   pH, Arterial 7.323 (  L) 7.350 - 7.450   pCO2 arterial 48.4 (H) 35.0 - 45.0 mmHg   pO2, Arterial 61.1 (L) 80.0 - 100.0 mmHg   Bicarbonate 24.4 (H) 20.0 - 24.0 mEq/L   TCO2 25.9 0 - 100 mmol/L   Acid-base deficit 0.9 0.0 - 2.0 mmol/L   O2 Saturation 89.3 %   Patient temperature 98.5    Collection site LEFT RADIAL    Drawn by [phone]    Sample type ARTERIAL DRAW    Allens test (pass/fail) PASS PASS  Heparin level (unfractionated)     Status: None   Collection Time: 07/03/15 10:52 AM  Result Value Ref Range   Heparin Unfractionated 0.68 0.30 - 0.70 IU/mL    Comment:        IF HEPARIN RESULTS ARE BELOW EXPECTED VALUES, AND PATIENT DOSAGE HAS BEEN CONFIRMED, SUGGEST FOLLOW UP TESTING OF ANTITHROMBIN III LEVELS.   Glucose, capillary     Status: Abnormal   Collection Time: 07/03/15 11:59 AM  Result Value Ref Range    Glucose-Capillary 123 (H) 65 - 99 mg/dL    Imaging: Dg Chest Port 1 View  07/03/2015  CLINICAL DATA:  Intubation. EXAM: PORTABLE CHEST 1 VIEW COMPARISON:  07/02/2015. FINDINGS: Endotracheal tube, NG tube, left subclavian line in stable position. Prior CABG. Heart size stable. No focal infiltrate. No pleural effusion or pneumothorax. IMPRESSION: 1. Stable chest. 2. Prior CABG.  Heart size stable.  No acute pulmonary disease. Electronically Signed   By: Marcello Moores  Register   On: 07/03/2015 07:18   Dg Chest Portable 1 View  07/02/2015  CLINICAL DATA:  Intubated, risk prior tori distress EXAM: PORTABLE CHEST 1 VIEW COMPARISON:  07/02/2015 FINDINGS: Endotracheal tube with the tip 6.9 cm above the carina. Nasogastric tube coursing below the diaphragm. There is no focal parenchymal opacity. There is no pleural effusion or pneumothorax. The heart and mediastinal contours are unremarkable. There is evidence of prior CABG. The osseous structures are unremarkable. IMPRESSION: 1. No significant interval change. 2. Endotracheal tube with the tip 6.9 cm above the carina. Electronically Signed   By: Kathreen Devoid   On: 07/02/2015 09:43   Dg Chest Portable 1 View  07/02/2015  CLINICAL DATA:  Status post central line placement. EXAM: PORTABLE CHEST 1 VIEW COMPARISON:  July 02, 2015 FINDINGS: A left subclavian line has been placed in the interval. The distal tip appears to be in the central SVC. No pneumothorax. The cardiomediastinal silhouette is stable. The lung bases were not completely included on today's study. However, within visualize limits, no acute interval changes are otherwise seen. ET tube remains in good position. The NG tube terminates below today's film. IMPRESSION: Interval placement of a left subclavian line as described above without pneumothorax. No other interval changes. Electronically Signed   By: Dorise Bullion III M.D   On: 07/02/2015 09:31   Dg Chest Port 1 View  07/02/2015  CLINICAL DATA:   Shortness of breath, intubation, worsening respiratory distress this morning, COPD, coronary artery disease post MI and CABG, hypertension EXAM: PORTABLE CHEST 1 VIEW COMPARISON:  Portable exam 0703 hours compared to 09/01/2014 FINDINGS: Tip of endotracheal tube projects approximately 7.2 cm above carina. Nasogastric tube extends into stomach. Normal heart size post CABG. Mediastinal contours and pulmonary vascularity normal. Emphysematous changes without infiltrate, pleural effusion or pneumothorax. No acute osseous findings. IMPRESSION: Tube positions as above. COPD changes and evidence of prior CABG. No acute abnormalities. Electronically Signed   By: Lavonia Dana M.D.   On: 07/02/2015  07:21    Assessment:  1. Principal Problem: 2.   Acute respiratory failure with hypercapnia (HCC) 3. Active Problems: 4.   Tobacco abuse 5.   S/P CABG x 4 6.   Cardiomyopathy, ischemic 7.   Chronic obstructive pulmonary disease ([place]) 8.   Essential hypertension 9.   Cocaine abuse 10.   Plan:  1. 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 Would leave on IV heparin. Agree with plans to proceed to extubation. He is 3L positive, would start IV lasix diuresis today in light of his EF of 35-40%. He will ultimately need an ischemia evaluation - consider myoview versus LHC. Start low dose aspirin 81 mg daily when patient is extubated (he does not need 325 mg) and restart lipitor at that time as well. Discussed with patient and his brother who was in the room. Strongly emphasized need to get help with cocaine abuse.  Time Spent Directly with Patient:  35 minutes  CRITICAL CARE:  The patient is critically ill with multi-organ system failure and requires high complexity decision making for assessment and support, frequent evaluation and titration of therapies, application of advanced monitoring technologies and extensive interpretation of multiple  databases.  Length of Stay:  LOS: 1 day   Pixie Casino, MD, [facility] Attending Cardiologist [place] 07/03/2015, 12:39 PM

## 2015-07-03 NOTE — Progress Notes (Signed)
RN called, pt is more awake.  Pt was placed back on SBT- pt appears to be tol well so far, VSS, no distress noted.

## 2015-07-03 NOTE — Progress Notes (Signed)
Initial Nutrition Assessment  INTERVENTION:  Initiate TF via OGT with Vital AF 1.2 at 25 ml/h and Prostat 30 ml BID on day 1; on day 2, increase to goal rate of 75 ml/h (1800 ml per day) and d/c Prostat to provide 2160 kcals, 135 gm protein, 1458 ml free water daily.   NUTRITION DIAGNOSIS:   Inadequate oral intake related to inability to eat as evidenced by NPO status.   GOAL:   Patient will meet greater than or equal to 90% of their needs   MONITOR:   TF tolerance, Vent status, Skin, Weight trends, Labs, I & O's  REASON FOR ASSESSMENT:   Ventilator, Consult Enteral/tube feeding initiation and management  ASSESSMENT:   60 year old critically ill male with sig h/o COPD, CAD(CABG, and illicit drug use. Presenting with severe hypoxic respiratory failure from work. Acute COPD exacerbation, R/O flu and CAP.   Patient is currently intubated on ventilator support MV: 16.1 L/min Temp (24hrs), Avg:97.3 F (36.3 C), Min:95.5 F (35.3 C), Max:99 F (37.2 C)  Propofol: OFF  Pt on vent with OGT to suction; approximately 200 ml of green output at time of visit. RD familiar with patient from previous hospitalization due to weight loss- his weight has been stable since and he now appears well-nourished.   Labs: low calcium, glucose ranging 120 to 213 mg/dL   Diet Order:  Diet NPO time specified  Skin:  Reviewed, no issues  Last BM:  PTA  Height:   Ht Readings from Last 1 Encounters:  07/02/15 6' (1.829 m)    Weight:   Wt Readings from Last 1 Encounters:  07/03/15 189 lb 13.1 oz (86.1 kg)    Ideal Body Weight:  80.9 kg  BMI:  Body mass index is 25.74 kg/(m^2).  Estimated Nutritional Needs:   Kcal:  2139  Protein:  130-155 grams  Fluid:  >/=2.1 L/day  EDUCATION NEEDS:   No education needs identified at this time  [place], LDN Inpatient Clinical Dietitian Pager: [phone] After Hours Pager: [phone]

## 2015-07-03 NOTE — Progress Notes (Signed)
ANTICOAGULATION CONSULT NOTE - Follow Consult  Pharmacy Consult for heparin Indication:for possible ACS, Ischemic EKG changes No Known Allergies  Patient Measurements: Height: 6' (182.9 cm) Weight: 189 lb 13.1 oz (86.1 kg) IBW/kg (Calculated) : 77.6 Heparin Dosing Weight: 83.1 kg  Vital Signs: Temp: 99 F (37.2 C) (01/20 1205) Temp Source: Oral (01/20 1205) BP: 106/54 mmHg (01/20 1100) Pulse Rate: 62 (01/20 1100)  Labs:  Recent Labs  07/02/15 0705  07/02/15 1134 07/02/15 1530 07/02/15 1843 07/02/15 2142 07/03/15 0330 07/03/15 1052  HGB 13.9  --  12.1*  --   --   --  11.6*  --   HCT 42.0  --  37.2*  --   --   --  36.0*  --   PLT 319  --  272  --   --   --  259  --   APTT  --   --  39*  --   --   --   --   --   LABPROT  --   --  14.1  --   --   --   --   --   INR  --   --  1.07  --   --   --   --   --   HEPARINUNFRC  --   --   --   --  0.28*  --  0.56 0.68  CREATININE 1.21  --  1.25*  --   --   --  1.23  --   TROPONINI <0.03  < >  --  <0.03  --  <0.03 <0.03  --   < > = values in this interval not displayed.  Estimated Creatinine Clearance: 69.2 mL/min (by C-G formula based on Cr of 1.23).   Medical History: Past Medical History  Diagnosis Date  . COPD (chronic obstructive pulmonary disease) ([place])   . Perforation of duodenal ulcer ([place])     a. 1970s - s/p surgery.   . H/O: GI bleed     a. 60 y/o ago, r/t ulcer. Denies recent GIB.  . H/O: eczema     lower extremities  . High cholesterol   . Hypertension   . Cocaine abuse   . Tobacco abuse   . CAD (coronary artery disease)     4V CABG 10/31/13  . Myocardial infarction ([place])   . GERD (gastroesophageal reflux disease)     Assessment: 60 yo M in ED intubated and sedated. Currently on IV heparin for for possible ACS, Ischemic EKG changes. Heparin level remains therapeutic at 0.68. Cardiology suspects acute cocaine cardiomyopathy. Planning to leave on IV heparin. H/H mildly low, Plt wnl. No bleeding noted.    Goal of Therapy:  Heparin level 0.3-0.7 units/ml Monitor platelets by anticoagulation protocol: Yes   Plan:  Continue heparin infusion at 1050 units/hr.  Monitor daily HL Continue to monitor H&H and platelets   Albertina Parr, PharmD., BCPS Clinical Pharmacist Pager [phone]

## 2015-07-03 NOTE — Procedures (Signed)
Extubation Procedure Note  Patient Details:   Name: QUINN WINTERBOTTOM DOB: March 11, 1956 MRN: [id number]   Airway Documentation:   + air leak cuff test prior to extubation  Evaluation  O2 sats: stable throughout Complications: No apparent complications Patient did tolerate procedure well. Bilateral Breath Sounds: Clear Suctioning: Oral, Airway Yes, pt able to speak.  No stridor noted, no distress noted. VSS, pt denies SOB.    Lenna Sciara 07/03/2015, 2:23 PM

## 2015-07-03 NOTE — Progress Notes (Signed)
PULMONARY / CRITICAL CARE MEDICINE   Name: [name] MRN: [id number] DOB: [birth year]    ADMISSION DATE:  07/02/2015  REFERRING MD:  Betsey Holiday  CHIEF COMPLAINT:  Hypoxic Respiratory Distress   SUBJECTIVE: Patient intubated and sedated, required pressors for hypotension now improving on IVF only, good UOP  VITAL SIGNS: BP 92/52 mmHg  Pulse 59  Temp(Src) 99 F (37.2 C) (Oral)  Resp 18  Ht 6' (1.829 m)  Wt 86.1 kg (189 lb 13.1 oz)  BMI 25.74 kg/m2  SpO2 95%  HEMODYNAMICS:    VENTILATOR SETTINGS: Vent Mode:  [-] PRVC FiO2 (%):  [40 %] 40 % Set Rate:  [18 bmp] 18 bmp Vt Set:  [620 mL] 620 mL PEEP:  [5 cmH20] 5 cmH20 Plateau Pressure:  [16 cmH20-20 cmH20] 17 cmH20  INTAKE / OUTPUT: I/O last 3 completed shifts: In: 5264.3 [I.V.:5214.3; NG/GT:50] Out: 2210 [Urine:1010; Emesis/NG output:400; Other:800]  PHYSICAL EXAMINATION: General:  Critically ill male, intubated and sedated.  Neuro: RASS -2 on sedated, responds to voice but following limited commands HEENT:  ETT in place, pupils constricted bilaterally but reactive  Cardiovascular:  Regular rate and rhythm, no edema  Lungs:  Diffuse wheezes, good ar movement in all fields Abdomen:  BS+ throughout, soft, non-distended, NG tube in place.  Musculoskeletal:  No joint deformities noted  Skin:  Cool, dry and intact  LABS:  BMET  Recent Labs Lab 07/02/15 0705 07/02/15 1134 07/03/15 0330  NA 139 141 142  K 3.9 4.1 3.9  CL 106 106 109  CO2 25 26 26   BUN 10 9 12   CREATININE 1.21 1.25* 1.23  GLUCOSE 213* 162* 135*    Electrolytes  Recent Labs Lab 07/02/15 0705 07/02/15 1134 07/03/15 0330  CALCIUM 8.4* 8.1* 8.2*  MG  --   --  1.8  PHOS  --   --  3.1    CBC  Recent Labs Lab 07/02/15 0705 07/02/15 1134 07/03/15 0330  WBC 8.4 6.7 8.3  HGB 13.9 12.1* 11.6*  HCT 42.0 37.2* 36.0*  PLT 319 272 259    Coag's  Recent Labs Lab 07/02/15 1134  APTT 39*  INR 1.07    Sepsis Markers  Recent  Labs Lab 07/02/15 0721 07/02/15 1134 07/03/15 0330  LATICACIDVEN 0.98 0.7  --   PROCALCITON  --   --  0.21    ABG  Recent Labs Lab 07/02/15 0742 07/02/15 2225 07/03/15 0356  PHART 7.193* 7.308* 7.323*  PCO2ART 75.3* 48.8* 48.4*  PO2ART 262.0* 128.0* 61.1*    Liver Enzymes  Recent Labs Lab 07/02/15 0705 07/02/15 1134  AST 24 20  ALT 18 17  ALKPHOS 110 89  BILITOT 0.5 0.5  ALBUMIN 3.6 3.1*    Cardiac Enzymes  Recent Labs Lab 07/02/15 1530 07/02/15 2142 07/03/15 0330  TROPONINI <0.03 <0.03 <0.03    Glucose  Recent Labs Lab 07/02/15 1209 07/03/15 0001  GLUCAP 127* 120*    Imaging Dg Chest Port 1 View  07/03/2015  CLINICAL DATA:  Intubation. EXAM: PORTABLE CHEST 1 VIEW COMPARISON:  07/02/2015. FINDINGS: Endotracheal tube, NG tube, left subclavian line in stable position. Prior CABG. Heart size stable. No focal infiltrate. No pleural effusion or pneumothorax. IMPRESSION: 1. Stable chest. 2. Prior CABG.  Heart size stable.  No acute pulmonary disease. Electronically Signed   By: [place]   On: 07/03/2015 07:18   Dg Chest Portable 1 View  07/02/2015  CLINICAL DATA:  Status post central line placement. EXAM: PORTABLE CHEST 1  VIEW COMPARISON:  July 02, 2015 FINDINGS: A left subclavian line has been placed in the interval. The distal tip appears to be in the central SVC. No pneumothorax. The cardiomediastinal silhouette is stable. The lung bases were not completely included on today's study. However, within visualize limits, no acute interval changes are otherwise seen. ET tube remains in good position. The NG tube terminates below today's film. IMPRESSION: Interval placement of a left subclavian line as described above without pneumothorax. No other interval changes. Electronically Signed   By: Dorise Bullion III M.D   On: 07/02/2015 09:31    STUDIES:  Chest Xray 07/02/15: Emphysematous changes without infiltrate, pleural effusion, or pneumothorax, COPD  changes and evidence of prior CABG, no acute abnormalities.   CULTURES: Urine Culture 06/30/2015 >> Blood Culture x 2 06/30/15 >> Sputum Culture 06/30/15 >> rvp 1/19>>>  ANTIBIOTICS: Azithromycin 500 mg 07/02/2015 >> Rocephin 1 gm 07/02/2015 >> Tamiflu 07/02/2015>>>  LINES/TUBES: ETT 06/30/2015 >> Left [place] CVL 1/19>>  SIGNIFICANT EVENTS: 1/19 intubated for respiratory failure, pan-cultures drawn, pressors started for BP support with known severe dHFand moderate sHF  DISCUSSION: 60 year old critically ill male with sig h/o COPD, CAD(CABG, and illicit drug use. Presenting with severe hypoxic respiratory failure from work. Acute COPD exacerbation, R/O flu and CAP. Patient has been PAN cultured and started Azithromycin and Rocephin as well as tamiflu. Became hypotensive in ED. Had some ST depression. Central line placed. Unclear if this is d/t propofol, hypovolemia, or cardiac in origin. Will ck CVP, recheck abg, continue fluid resuscitation and add levophed. Will admit to ICU.   ASSESSMENT / PLAN:  PULMONARY A: Acute Hypoxic Respiratory Failure  In setting of Acute COPD Exacerbation  R/O Flu  CXR is clear P:   Solumedrol 60 mg  IV q6hrs Tamiflu 75 mg BID  Duoneb 3 mL q 4 hours Albuterol 2.5 mg q 2 hours prn  RVP Panel WUA/SBT daily PAD protocol   CARDIOVASCULAR A:  Circulatory Shock-->etiology unclear: possibly medication vs cardiac vs relative hypovolemia-->does not meet SIRS criteria so sepsis seems unlikely  Negative initial Cardiac Enzymes but has ST depression w/ + cocaine in UDS H/O Hyperlipidemia  H/O HTN, CAD, MI, CABG P:  Telemetry  Cards consulted  Hold Home BP meds  See ID section   RENAL A:   No acute at risk related to critical illness P:   Cont IVFs - NS @ 100 ml/hr Foley Catheter for Strict I & O Avoid hypotension  Trend BMP  GASTROINTESTINAL A:   H/O GERD Risk for critical illness related malnutrition  P:   Protonix 40 mg q 24 hours  Consult  nutrition  HEMATOLOGIC A:   No acute, at risk for related to critical illness  P:  Heparin sq q 8 hours  Trend CBC  Transfuse per ICU protocol   INFECTIOUS A:   R/O Flu  R/O CAP Afebrile  No Leukocytosis  P:   Azithromycin 500 mg 07/02/2015 >>> Rocephin 1 gm 07/02/2015 >>> Urine Culture 07/02/2015>>> Blood Culture 07/02/15>>> Sputum Culture 07/02/2015>>> Droplet Precautions - Pending RVP RVP 1/19>>>  ENDOCRINE A:   Hyperglycemia  P:   q4 CBG SSI  NEUROLOGIC A:   No acute P:   PAD protocol  Try weaning from continuous propofol, fentanyl RASS goal: -1 to 0   FAMILY  - Updates: pending.  - Inter-disciplinary family meet or Palliative Care meeting due by:  07/09/2015  Collier Salina, MD PGY-I Internal Medicine Resident Pager# [phone] 07/03/2015,  9:40 AM

## 2015-07-03 NOTE — Progress Notes (Signed)
RN called d/t vent alarming.  Noted "no pt effort" alarm.  Pt appears to be awake, however he has episodes when his RR drops to 6 bpm.  Changed back to full vent support, RN aware.

## 2015-07-03 NOTE — Progress Notes (Signed)
Wasted 158mL fentanyl in sink with Dustin Folks RN.

## 2015-07-04 ENCOUNTER — Inpatient Hospital Stay (HOSPITAL_COMMUNITY): Payer: MEDICAID

## 2015-07-04 DIAGNOSIS — F1410 Cocaine abuse, uncomplicated: Secondary | ICD-10-CM

## 2015-07-04 LAB — GLUCOSE, CAPILLARY
GLUCOSE-CAPILLARY: 118 mg/dL — AB (ref 65–99)
GLUCOSE-CAPILLARY: 127 mg/dL — AB (ref 65–99)
Glucose-Capillary: 136 mg/dL — ABNORMAL HIGH (ref 65–99)
Glucose-Capillary: 135 mg/dL — ABNORMAL HIGH (ref 65–99)
Glucose-Capillary: 174 mg/dL — ABNORMAL HIGH (ref 65–99)
Glucose-Capillary: 158 mg/dL — ABNORMAL HIGH (ref 65–99)

## 2015-07-04 LAB — HEPARIN LEVEL (UNFRACTIONATED): Heparin Unfractionated: 0.39 IU/mL (ref 0.30–0.70)

## 2015-07-04 LAB — CBC
HCT: 36.1 % — ABNORMAL LOW (ref 39.0–52.0)
HEMOGLOBIN: 12.1 g/dL — AB (ref 13.0–17.0)
MCH: 29.2 pg (ref 26.0–34.0)
MCHC: 33.5 g/dL (ref 30.0–36.0)
MCV: 87.2 fL (ref 78.0–100.0)
Platelets: 264 10*3/uL (ref 150–400)
RBC: 4.14 MIL/uL — AB (ref 4.22–5.81)
RDW: 15.5 % (ref 11.5–15.5)
WBC: 13.8 10*3/uL — ABNORMAL HIGH (ref 4.0–10.5)

## 2015-07-04 LAB — CULTURE, RESPIRATORY

## 2015-07-04 LAB — BASIC METABOLIC PANEL
ANION GAP: 12 (ref 5–15)
BUN: 15 mg/dL (ref 6–20)
CALCIUM: 8.7 mg/dL — AB (ref 8.9–10.3)
CHLORIDE: 100 mmol/L — AB (ref 101–111)
CO2: 28 mmol/L (ref 22–32)
Creatinine, Ser: 1.13 mg/dL (ref 0.61–1.24)
GFR calc non Af Amer: >60 mL/min (ref >60)
Glucose, Bld: 133 mg/dL — ABNORMAL HIGH (ref 65–99)
Potassium: 3.6 mmol/L (ref 3.5–5.1)
Sodium: 140 mmol/L (ref 135–145)

## 2015-07-04 LAB — TROPONIN I
TROPONIN I: 0.04 ng/mL — AB (ref <0.031)
Troponin I: <0.03 ng/mL (ref <0.031)
Troponin I: 0.04 ng/mL — ABNORMAL HIGH (ref <0.031)

## 2015-07-04 LAB — HCV COMMENT:

## 2015-07-04 LAB — MAGNESIUM: Magnesium: 1.9 mg/dL (ref 1.7–2.4)

## 2015-07-04 LAB — PHOSPHORUS: PHOSPHORUS: 1.9 mg/dL — AB (ref 2.5–4.6)

## 2015-07-04 LAB — HEPATITIS C ANTIBODY (REFLEX): HCV Ab: <0.1 s/co ratio (ref 0.0–0.9)

## 2015-07-04 MED ORDER — MAGNESIUM SULFATE 2 GM/50ML IV SOLN
2.0000 g | Freq: Once | INTRAVENOUS | Status: AC
  Administered 2015-07-04: 2 g via INTRAVENOUS
  Filled 2015-07-04: qty 50

## 2015-07-04 MED ORDER — HEPARIN SODIUM (PORCINE) 5000 UNIT/ML IJ SOLN
5000.0000 [IU] | Freq: Three times a day (TID) | INTRAMUSCULAR | Status: DC
  Administered 2015-07-04 – 2015-07-06 (×6): 5000 [IU] via SUBCUTANEOUS
  Filled 2015-07-04 (×6): qty 1

## 2015-07-04 MED ORDER — ASPIRIN 81 MG PO CHEW: 81.0000 mg | CHEWABLE_TABLET | Freq: Every day | ORAL | Status: DC

## 2015-07-04 MED ORDER — ATORVASTATIN CALCIUM 10 MG PO TABS
10.0000 mg | ORAL_TABLET | Freq: Every day | ORAL | Status: DC
  Administered 2015-07-04 – 2015-07-05 (×2): 10 mg via ORAL
  Filled 2015-07-04 (×2): qty 1

## 2015-07-04 MED ORDER — POTASSIUM PHOSPHATES 15 MMOLE/5ML IV SOLN
20.0000 meq | Freq: Once | INTRAVENOUS | Status: AC
  Administered 2015-07-04: 20 meq via INTRAVENOUS
  Filled 2015-07-04: qty 4.55

## 2015-07-04 MED ORDER — PREDNISONE 20 MG PO TABS
40.0000 mg | ORAL_TABLET | Freq: Every day | ORAL | Status: DC
  Administered 2015-07-05 – 2015-07-06 (×2): 40 mg via ORAL
  Filled 2015-07-04: qty 2
  Filled 2015-07-04: qty 4
  Filled 2015-07-04: qty 2

## 2015-07-04 MED ORDER — LISINOPRIL 10 MG PO TABS
10.0000 mg | ORAL_TABLET | Freq: Every day | ORAL | Status: DC
  Administered 2015-07-04 – 2015-07-06 (×3): 10 mg via ORAL
  Filled 2015-07-04 (×4): qty 1

## 2015-07-04 MED ORDER — DOXYCYCLINE HYCLATE 100 MG PO TABS
100.0000 mg | ORAL_TABLET | Freq: Two times a day (BID) | ORAL | Status: DC
  Administered 2015-07-04 – 2015-07-06 (×5): 100 mg via ORAL
  Filled 2015-07-04 (×6): qty 1

## 2015-07-04 NOTE — Evaluation (Signed)
Physical Therapy Evaluation Patient Details Name: [name] MRN: [id number] DOB: 04-24-1956 Today's Date: 07/04/2015   History of Present Illness  pt is a 60 y/o male with h/o COPD, GIB, HTN, Cocaine and tobacco abuse, MI, admitted with ARF with hypoxia.  Intubated on arrival, extubated 1/20.  Clinical Impression  Pt is at or close to baseline functioning and should be safe at home when released from medical service.  Will have nursing staff continue to mobilize patient. There are no further acute PT needs.  Will sign off at this time.     Follow Up Recommendations No PT follow up    Equipment Recommendations  None recommended by PT    Recommendations for Other Services       Precautions / Restrictions Precautions Precautions: None      Mobility  Bed Mobility Overal bed mobility: Modified Independent                Transfers Overall transfer level: Modified independent                  Ambulation/Gait Ambulation/Gait assistance: Supervision (due to all the lines/tubes) Ambulation Distance (Feet): 300 Feet Assistive device: None Gait Pattern/deviations: Step-through pattern   Gait velocity interpretation: at or above normal speed for age/gender General Gait Details: steady and fluid, no deviations or loss of balance.  Stairs            Wheelchair Mobility    Modified Rankin (Stroke Patients Only)       Balance Overall balance assessment: Needs assistance   Sitting balance-Leahy Scale: Normal     Standing balance support: No upper extremity supported Standing balance-Leahy Scale: Good                               Pertinent Vitals/Pain Pain Assessment: No/denies pain    Home Living Family/patient expects to be discharged to:: Private residence Living Arrangements: Alone Available Help at Discharge: Available PRN/intermittently;Family (brother) Type of Home: House Home Access: Stairs to enter   Engineer, site of Steps: several Home Layout: One level Home Equipment: None      Prior Function Level of Independence: Independent               Hand Dominance        Extremity/Trunk Assessment   Upper Extremity Assessment: Defer to OT evaluation [facility])           Lower Extremity Assessment: Overall WFL for tasks assessed         Communication   Communication: No difficulties  Cognition Arousal/Alertness: Awake/alert Behavior During Therapy: WFL for tasks assessed/performed Overall Cognitive Status: Within Functional Limits for tasks assessed                      General Comments General comments (skin integrity, edema, etc.): SpO2 stayed at 94% on RA with ambulation.  HR in the 90's/low 100's bpm    Exercises        Assessment/Plan    PT Assessment Patent does not need any further PT services  PT Diagnosis     PT Problem List    PT Treatment Interventions     PT Goals (Current goals can be found in the Care Plan section) Acute Rehab PT Goals PT Goal Formulation: All assessment and education complete, DC therapy    Frequency     Barriers to discharge  Co-evaluation               End of Session   Activity Tolerance: Patient tolerated treatment well Patient left: in chair;with call bell/phone within reach Nurse Communication: Mobility status         Time: [id number] PT Time Calculation (min) (ACUTE ONLY): 22 min   Charges:   PT Evaluation $PT Eval Moderate Complexity: 1 Procedure     PT G Codes:        Myles Tavella, Tessie Fass 07/04/2015, 10:36 AM 07/04/2015  Donnella Sham, [place] [phone]  (pager)

## 2015-07-04 NOTE — Progress Notes (Signed)
PULMONARY / CRITICAL CARE MEDICINE   Name: [name] MRN: [id number] DOB: 1955/10/12    ADMISSION DATE:  07/02/2015  REFERRING MD:  Betsey Holiday  CHIEF COMPLAINT:  Hypoxic Respiratory Distress   07/03/15 Patient intubated and sedated, required pressors for hypotension now improving on IVF only, good UOP   SUBJECTIVE/OVERNIGHT/INTERVAL HX 07/04/15 - extbated yesterday. Dong well on 2L [place] (denies home o2 and seeing PCCM)  VITAL SIGNS: BP 135/82 mmHg  Pulse 91  Temp(Src) 98.7 F (37.1 C) (Oral)  Resp 16  Ht 6' (1.829 m)  Wt 85.4 kg (188 lb 4.4 oz)  BMI 25.53 kg/m2  SpO2 99%  HEMODYNAMICS:    VENTILATOR SETTINGS: Vent Mode:  [-] CPAP;PSV FiO2 (%):  [40 %] 40 % Set Rate:  [18 bmp] 18 bmp Vt Set:  [id number] mL] 620 mL PEEP:  [5 cmH20] 5 cmH20 Pressure Support:  [5 cmH20] 5 cmH20 Plateau Pressure:  [16 cmH20] 16 cmH20  INTAKE / OUTPUT: I/O last 3 completed shifts: In: 3697.3 [I.V.:3347.3; IV Piggyback:350] Out: 2475 [Urine:2275; Emesis/NG output:200]  PHYSICAL EXAMINATION: General:  Looks improved Neuro: RASS 0. Moves all 4s. AxOx43.GCS 15. Watching TV. CAM - neg for delirium HEENT:  No JVP elevation. No nodes Cardiovascular:  Regular rate and rhythm, no edema  Lungs: CTA. No wheezes, good ar movement in all fields Abdomen:  BS+ throughout, soft, non-distended,  Musculoskeletal:  No joint deformities noted  Skin:  Cool, dry and intact  LABS:  BMET  Recent Labs Lab 07/02/15 1134 07/03/15 0330 07/04/15 0323  NA 141 142 140  K 4.1 3.9 3.6  CL 106 109 100*  CO2 26 26 28   BUN 9 12 15   CREATININE 1.25* 1.23 1.13  GLUCOSE 162* 135* 133*    Electrolytes  Recent Labs Lab 07/02/15 1134 07/03/15 0330 07/03/15 1205 07/04/15 0323  CALCIUM 8.1* 8.2*  --  8.7*  MG  --  1.8 1.9 1.9  PHOS  --  3.1 2.0* 1.9*    CBC  Recent Labs Lab 07/02/15 1134 07/03/15 0330 07/04/15 0323  WBC 6.7 8.3 13.8*  HGB 12.1* 11.6* 12.1*  HCT 37.2* 36.0* 36.1*  PLT 272 259  264    Coag's  Recent Labs Lab 07/02/15 1134  APTT 39*  INR 1.07    Sepsis Markers  Recent Labs Lab 07/02/15 0721 07/02/15 1134 07/03/15 0330  LATICACIDVEN 0.98 0.7  --   PROCALCITON  --   --  0.21    ABG  Recent Labs Lab 07/02/15 0742 07/02/15 2225 07/03/15 0356  PHART 7.193* 7.308* 7.323*  PCO2ART 75.3* 48.8* 48.4*  PO2ART 262.0* 128.0* 61.1*    Liver Enzymes  Recent Labs Lab 07/02/15 0705 07/02/15 1134  AST 24 20  ALT 18 17  ALKPHOS 110 89  BILITOT 0.5 0.5  ALBUMIN 3.6 3.1*    Cardiac Enzymes  Recent Labs Lab 07/03/15 1545 07/03/15 2200 07/04/15 0323  TROPONINI <0.03 <0.03 <0.03    Glucose  Recent Labs Lab 07/03/15 1159 07/03/15 1528 07/03/15 2002 07/03/15 2357 07/04/15 0322 07/04/15 0807  GLUCAP 123* 135* 123* 118* 136* 135*    Imaging No results found.  STUDIES:  Chest Xray 07/02/15: Emphysematous changes without infiltrate, pleural effusion, or pneumothorax, COPD changes and evidence of prior CABG, no acute abnormalities.   CULTURES: Urine Culture 06/30/2015 >> Blood Culture x 2 06/30/15 >> Sputum Culture 06/30/15 >> rvp 1/19>>> NEG  ANTIBIOTICS: Azithromycin 500 mg 07/02/2015 >>1/21 Rocephin 1 gm 07/02/2015 >>1/21 Tamiflu 07/02/2015>>>1/21 Doxy po 1/21 > (  4 days)  LINES/TUBES: ETT 06/30/2015 >>1/20 Left [place] CVL 1/19>>1/21  SIGNIFICANT EVENTS: 1/19 intubated for respiratory failure, pan-cultures drawn, pressors started for BP support with known severe dHFand moderate sHF   ASSESSMENT / PLAN:  PULMONARY A: Acute Hypoxic Respiratory Failure  In setting of Acute COPD Exacerbation    P:   Solumedrol 60 mg  IV q6hrs Tamiflu 75 mg BID  Duoneb 3 mL q 4 hours Albuterol 2.5 mg q 2 hours prn  RVP Panel WUA/SBT daily PAD protocol   CARDIOVASCULAR A:  Circulatory Shock-->etiology unclear: possibly medication vs cardiac vs relative hypovolemia-->does not meet SIRS criteria so sepsis seems unlikely  Negative initial  Cardiac Enzymes but has ST depression w/ + cocaine in UDS H/O Hyperlipidemia  H/O HTN, CAD, MI, CABG   - off pressors. 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  P:  Telemetry  ASA 81 Restart lipitor Contine IV heparin per cards note 07/03/15- need their advice to stop Needs stress test as opd Wean off cocaine at home   RENAL A:   No acute at risk related to critical illness P:   Cont IVFs - NS @ 100 ml/hr Foley Catheter for Strict I & O Avoid hypotension  Trend BMP  GASTROINTESTINAL A:   H/O GERD Risk for critical illness related malnutrition  P:   Protonix 40 mg q 24 hours  jheart healthy diet  HEMATOLOGIC A:   No acute, at risk for related to critical illness  P:  Heparin sq q 8 hours  Trend CBC  Transfuse per ICU protocol   INFECTIOUS A:   AECOPD - RVP negative. Likely due to cocaine  P:   Doxy for 4 more days from 07/04/15 Change IV to po steroids 07/04/15 and TRH to complete 8-12 d total taper  ENDOCRINE A:   Hyperglycemia  P:   q4 CBG SSI  NEUROLOGIC A:   No acute P:   PAD protocol  Try weaning from continuous propofol, fentanyl RASS goal: -1 to 0   FAMILY  - Updates: patient updated  - Inter-disciplinary family meet or Palliative Care meeting due by:  07/09/2015   GLOBAL Move to tele 07/04/2015  and PCCM off with [place] primary from 07/05/15 - d/w [place]  Dr. Brand Males, M.D., F.C.C.P Pulmonary and Critical Care Medicine Staff Physician [facility] Pager: [phone], If no answer or between  15:00h - 7:00h: call [phone]  07/04/2015 9:12 AM

## 2015-07-04 NOTE — Progress Notes (Signed)
Bedside swallow screen completed four hours post extubation. Pt describes no difficulty swallowing. Lungs clear and diminished. Will continue to monitor.

## 2015-07-04 NOTE — Progress Notes (Signed)
DAILY PROGRESS NOTE  Subjective:  Echo shows EF of 35-40% with diffuse hypokinesis and severe hypokinesis of the apex with grade 2 diastolic dysfunction and high LV filling pressure. Despite significant EKG changes suggestive of ischemia, troponin remained negative. BNP was actually very low at 178. Extubated today and feeling well.  Had long talk with Dr. Debara Pickett yesterday and he is without questions about that discussion.  Has had no chest pain prior to the episode of acute shortness of breath but does say he was abusing prior to the event. Objective:  Temp:  [97.7 F (36.5 C)-99 F (37.2 C)] 98.7 F (37.1 C) (01/21 0810) Pulse Rate:  [60-103] 91 (01/21 0810) Resp:  [10-36] 16 (01/21 0810) BP: (101-153)/(53-84) 135/82 mmHg (01/21 0800) SpO2:  [90 %-100 %] 99 % (01/21 0812) FiO2 (%):  [40 %] 40 % (01/20 1329) Weight:  [188 lb 4.4 oz (85.4 kg)] 188 lb 4.4 oz (85.4 kg) (01/21 0300) Weight change: -1 lb 8.7 oz (-0.7 kg)  Intake/Output from previous day: 01/20 0701 - 01/21 0700 In: 1745.5 [I.V.:1395.5; IV Piggyback:350] Out: 1900 [Urine:1900]  Intake/Output from this shift: Total I/O In: 21.5 [I.V.:21.5] Out: -   Medications: Current Facility-Administered Medications  Medication Dose Route Frequency Provider Last Rate Last Dose  . 0.9 %  sodium chloride infusion   Intravenous Continuous Brand Males, MD 10 mL/hr at 07/03/15 2300    . albuterol (PROVENTIL) (2.5 MG/3ML) 0.083% nebulizer solution 2.5 mg  2.5 mg Nebulization Q2H PRN Rush Farmer, MD      . aspirin chewable tablet 81 mg  81 mg Oral Daily Pixie Casino, MD   81 mg at 07/04/15 0919  . atorvastatin (LIPITOR) tablet 10 mg  10 mg Oral q1800 Brand Males, MD      . Chlorhexidine Gluconate Cloth 2 % PADS 6 each  6 each Topical Q0600 Rush Farmer, MD   6 each at 07/04/15 0600  . doxycycline (VIBRA-TABS) tablet 100 mg  100 mg Oral Q12H Brand Males, MD      . fentaNYL (SUBLIMAZE) injection 12.5-25 mcg   12.5-25 mcg Intravenous Q2H PRN Collier Salina, MD      . furosemide (LASIX) injection 40 mg  40 mg Intravenous Daily Pixie Casino, MD   40 mg at 07/04/15 0919  . heparin ADULT infusion 100 units/mL (25000 units/250 mL)  1,150 Units/hr Intravenous Continuous Rebecka Apley, RPH 11.5 mL/hr at 07/04/15 0748 1,150 Units/hr at 07/04/15 0748  . ipratropium-albuterol (DUONEB) 0.5-2.5 (3) MG/3ML nebulizer solution 3 mL  3 mL Nebulization Q4H Rush Farmer, MD   3 mL at 07/04/15 0812  . magnesium sulfate IVPB 2 g 50 mL  2 g Intravenous Once Brand Males, MD      . mupirocin ointment (BACTROBAN) 2 % 1 application  1 application Nasal BID Rush Farmer, MD   1 application at 88/50/27 0919  . potassium phosphate 20 mEq in dextrose 5 % 250 mL infusion  20 mEq Intravenous Once Zada Finders, MD   20 mEq at 07/04/15 0651  . [START ON 07/05/2015] predniSONE (DELTASONE) tablet 40 mg  40 mg Oral Q breakfast Brand Males, MD        Physical Exam: General appearance: alert and intubated, no distress Neck: JVD - 3 cm above sternal notch and no carotid bruit Lungs: diminished breath sounds bilaterally Heart: regular rate and rhythm, S1, S2 normal, no murmur, click, rub or gallop Abdomen: soft, non-tender; bowel sounds normal;  no masses,  no organomegaly Extremities: extremities normal, atraumatic, no cyanosis or edema Pulses: 2+ and symmetric Skin: Skin color, texture, turgor normal. No rashes or lesions Neurologic: Mental status: Alert, follows commands .  Lab Results: Results for orders placed or performed during the hospital encounter of 07/02/15 (from the past 48 hour(s))  Urinalysis, Routine w reflex microscopic (not at [facility])     Status: Abnormal   Collection Time: 07/02/15 10:06 AM  Result Value Ref Range   Color, Urine YELLOW YELLOW   APPearance CLEAR CLEAR   Specific Gravity, Urine 1.025 1.005 - 1.030   pH 5.5 5.0 - 8.0   Glucose, UA NEGATIVE NEGATIVE mg/dL   Hgb urine dipstick TRACE  (A) NEGATIVE   Bilirubin Urine NEGATIVE NEGATIVE   Ketones, ur NEGATIVE NEGATIVE mg/dL   Protein, ur 100 (A) NEGATIVE mg/dL   Nitrite NEGATIVE NEGATIVE   Leukocytes, UA NEGATIVE NEGATIVE  Urine culture     Status: None   Collection Time: 07/02/15 10:06 AM  Result Value Ref Range   Specimen Description URINE, CATHETERIZED    Special Requests NONE    Culture NO GROWTH 1 DAY    Report Status 07/03/2015 FINAL   Urine microscopic-add on     Status: Abnormal   Collection Time: 07/02/15 10:06 AM  Result Value Ref Range   Squamous Epithelial / LPF 6-30 (A) NONE SEEN   WBC, UA 6-30 0 - 5 WBC/hpf   RBC / HPF 0-5 0 - 5 RBC/hpf   Bacteria, UA FEW (A) NONE SEEN   Casts GRANULAR CAST (A) NEGATIVE    Comment: HYALINE CASTS   Urine-Other MUCOUS PRESENT   Troponin I     Status: None   Collection Time: 07/02/15 11:23 AM  Result Value Ref Range   Troponin I <0.03 <0.031 ng/mL    Comment:        NO INDICATION OF MYOCARDIAL INJURY.   CBC with Differential/Platelet     Status: Abnormal   Collection Time: 07/02/15 11:34 AM  Result Value Ref Range   WBC 6.7 4.0 - 10.5 K/uL   RBC 4.19 (L) 4.22 - 5.81 MIL/uL   Hemoglobin 12.1 (L) 13.0 - 17.0 g/dL   HCT 37.2 (L) 39.0 - 52.0 %   MCV 88.8 78.0 - 100.0 fL   MCH 28.9 26.0 - 34.0 pg   MCHC 32.5 30.0 - 36.0 g/dL   RDW 15.2 11.5 - 15.5 %   Platelets 272 150 - 400 K/uL   Neutrophils Relative % 79 %   Neutro Abs 5.2 1.7 - 7.7 K/uL   Lymphocytes Relative 13 %   Lymphs Abs 0.9 0.7 - 4.0 K/uL   Monocytes Relative 5 %   Monocytes Absolute 0.3 0.1 - 1.0 K/uL   Eosinophils Relative 3 %   Eosinophils Absolute 0.2 0.0 - 0.7 K/uL   Basophils Relative 0 %   Basophils Absolute 0.0 0.0 - 0.1 K/uL  Comprehensive metabolic panel     Status: Abnormal   Collection Time: 07/02/15 11:34 AM  Result Value Ref Range   Sodium 141 135 - 145 mmol/L   Potassium 4.1 3.5 - 5.1 mmol/L   Chloride 106 101 - 111 mmol/L   CO2 26 22 - 32 mmol/L   Glucose, Bld 162 (H) 65 - 99  mg/dL   BUN 9 6 - 20 mg/dL   Creatinine, Ser 1.25 (H) 0.61 - 1.24 mg/dL   Calcium 8.1 (L) 8.9 - 10.3 mg/dL   Total Protein 5.7 (L) 6.5 -  8.1 g/dL   Albumin 3.1 (L) 3.5 - 5.0 g/dL   AST 20 15 - 41 U/L   ALT 17 17 - 63 U/L   Alkaline Phosphatase 89 38 - 126 U/L   Total Bilirubin 0.5 0.3 - 1.2 mg/dL   GFR calc non Af Amer >60 >60 mL/min   GFR calc Af Amer >60 >60 mL/min    Comment: (NOTE) The eGFR has been calculated using the CKD EPI equation. This calculation has not been validated in all clinical situations. eGFR's persistently <60 mL/min signify possible Chronic Kidney Disease.    Anion gap 9 5 - 15  APTT     Status: Abnormal   Collection Time: 07/02/15 11:34 AM  Result Value Ref Range   aPTT 39 (H) 24 - 37 seconds    Comment:        IF BASELINE aPTT IS ELEVATED, SUGGEST PATIENT RISK ASSESSMENT BE USED TO DETERMINE APPROPRIATE ANTICOAGULANT THERAPY.   Protime-INR     Status: None   Collection Time: 07/02/15 11:34 AM  Result Value Ref Range   Prothrombin Time 14.1 11.6 - 15.2 seconds   INR 1.07 0.00 - 1.49  Lactic acid, plasma     Status: None   Collection Time: 07/02/15 11:34 AM  Result Value Ref Range   Lactic Acid, Venous 0.7 0.5 - 2.0 mmol/L  Brain natriuretic peptide     Status: Abnormal   Collection Time: 07/02/15 11:34 AM  Result Value Ref Range   B Natriuretic Peptide 178.8 (H) 0.0 - 100.0 pg/mL  Respiratory virus panel     Status: None   Collection Time: 07/02/15 12:09 PM  Result Value Ref Range   Respiratory Syncytial Virus A Negative Negative   Respiratory Syncytial Virus B Negative Negative   Influenza A Negative Negative   Influenza B Negative Negative   Parainfluenza 1 Negative Negative   Parainfluenza 2 Negative Negative   Parainfluenza 3 Negative Negative   Metapneumovirus Negative Negative   Rhinovirus Negative Negative   Adenovirus Negative Negative    Comment: (NOTE) Performed At: [facility] [place], [place]  [id number] Lindon Romp MD RC:[id number]   Glucose, capillary     Status: Abnormal   Collection Time: 07/02/15 12:09 PM  Result Value Ref Range   Glucose-Capillary 127 (H) 65 - 99 mg/dL  MRSA PCR Screening     Status: Abnormal   Collection Time: 07/02/15 12:10 PM  Result Value Ref Range   MRSA by PCR POSITIVE (A) NEGATIVE    Comment:        The GeneXpert MRSA Assay (FDA approved for NASAL specimens only), is one component of a comprehensive MRSA colonization surveillance program. It is not intended to diagnose MRSA infection nor to guide or monitor treatment for MRSA infections. RESULT CALLED TO, READ BACK BY AND VERIFIED WITH: P MCKINNEY,RN AT 1409 07/02/15 BY L BENFIELD   Triglycerides     Status: None   Collection Time: 07/02/15 12:41 PM  Result Value Ref Range   Triglycerides 102 <150 mg/dL  Culture, respiratory (NON-Expectorated)     Status: None (Preliminary result)   Collection Time: 07/02/15  3:29 PM  Result Value Ref Range   Specimen Description TRACHEAL ASPIRATE    Special Requests NONE    Gram Stain      MODERATE WBC PRESENT,BOTH PMN AND MONONUCLEAR FEW SQUAMOUS EPITHELIAL CELLS PRESENT FEW GRAM POSITIVE COCCI IN PAIRS RARE GRAM NEGATIVE RODS Performed at [facility]  Culture reincubated for better growth Performed at [facility]    Report Status PENDING   Troponin I     Status: None   Collection Time: 07/02/15  3:30 PM  Result Value Ref Range   Troponin I <0.03 <0.031 ng/mL    Comment:        NO INDICATION OF MYOCARDIAL INJURY.   Heparin level (unfractionated)     Status: Abnormal   Collection Time: 07/02/15  6:43 PM  Result Value Ref Range   Heparin Unfractionated 0.28 (L) 0.30 - 0.70 IU/mL    Comment:        IF HEPARIN RESULTS ARE BELOW EXPECTED VALUES, AND PATIENT DOSAGE HAS BEEN CONFIRMED, SUGGEST FOLLOW UP TESTING OF ANTITHROMBIN III LEVELS.   Troponin I     Status: None   Collection Time: 07/02/15   9:42 PM  Result Value Ref Range   Troponin I <0.03 <0.031 ng/mL    Comment:        NO INDICATION OF MYOCARDIAL INJURY.   I-STAT 3, arterial blood gas (G3+)     Status: Abnormal   Collection Time: 07/02/15 10:25 PM  Result Value Ref Range   pH, Arterial 7.308 (L) 7.350 - 7.450   pCO2 arterial 48.8 (H) 35.0 - 45.0 mmHg   pO2, Arterial 128.0 (H) 80.0 - 100.0 mmHg   Bicarbonate 24.4 (H) 20.0 - 24.0 mEq/L   TCO2 26 0 - 100 mmol/L   O2 Saturation 99.0 %   Acid-base deficit 2.0 0.0 - 2.0 mmol/L   Patient temperature 98.6 F    Collection site RADIAL, ALLEN'S TEST ACCEPTABLE    Drawn by Operator    Sample type ARTERIAL   Glucose, capillary     Status: Abnormal   Collection Time: 07/03/15 12:01 AM  Result Value Ref Range   Glucose-Capillary 120 (H) 65 - 99 mg/dL  Troponin I     Status: None   Collection Time: 07/03/15  3:30 AM  Result Value Ref Range   Troponin I <0.03 <0.031 ng/mL    Comment:        NO INDICATION OF MYOCARDIAL INJURY.   CBC     Status: Abnormal   Collection Time: 07/03/15  3:30 AM  Result Value Ref Range   WBC 8.3 4.0 - 10.5 K/uL   RBC 4.04 (L) 4.22 - 5.81 MIL/uL   Hemoglobin 11.6 (L) 13.0 - 17.0 g/dL   HCT 36.0 (L) 39.0 - 52.0 %   MCV 89.1 78.0 - 100.0 fL   MCH 28.7 26.0 - 34.0 pg   MCHC 32.2 30.0 - 36.0 g/dL   RDW 15.6 (H) 11.5 - 15.5 %   Platelets 259 150 - 400 K/uL  Basic metabolic panel     Status: Abnormal   Collection Time: 07/03/15  3:30 AM  Result Value Ref Range   Sodium 142 135 - 145 mmol/L   Potassium 3.9 3.5 - 5.1 mmol/L   Chloride 109 101 - 111 mmol/L   CO2 26 22 - 32 mmol/L   Glucose, Bld 135 (H) 65 - 99 mg/dL   BUN 12 6 - 20 mg/dL   Creatinine, Ser 1.23 0.61 - 1.24 mg/dL   Calcium 8.2 (L) 8.9 - 10.3 mg/dL   GFR calc non Af Amer >60 >60 mL/min   GFR calc Af Amer >60 >60 mL/min    Comment: (NOTE) The eGFR has been calculated using the CKD EPI equation. This calculation has not been validated in all clinical situations. eGFR's  persistently <60 mL/min signify possible Chronic Kidney Disease.    Anion gap 7 5 - 15  Magnesium     Status: None   Collection Time: 07/03/15  3:30 AM  Result Value Ref Range   Magnesium 1.8 1.7 - 2.4 mg/dL  Phosphorus     Status: None   Collection Time: 07/03/15  3:30 AM  Result Value Ref Range   Phosphorus 3.1 2.5 - 4.6 mg/dL  Heparin level (unfractionated)     Status: None   Collection Time: 07/03/15  3:30 AM  Result Value Ref Range   Heparin Unfractionated 0.56 0.30 - 0.70 IU/mL    Comment:        IF HEPARIN RESULTS ARE BELOW EXPECTED VALUES, AND PATIENT DOSAGE HAS BEEN CONFIRMED, SUGGEST FOLLOW UP TESTING OF ANTITHROMBIN III LEVELS.   HIV antibody     Status: None   Collection Time: 07/03/15  3:30 AM  Result Value Ref Range   HIV Screen 4th Generation wRfx Non Reactive Non Reactive    Comment: (NOTE) Performed At: [facility] [place], [place] [id number] Lindon Romp MD OJ:[id number]   Hepatitis c antibody (reflex)     Status: None   Collection Time: 07/03/15  3:30 AM  Result Value Ref Range   HCV Ab <0.1 0.0 - 0.9 s/co ratio    Comment: (NOTE) Performed At: [facility] [place], [place] [id number] Lindon Romp MD [id number]   Procalcitonin     Status: None   Collection Time: 07/03/15  3:30 AM  Result Value Ref Range   Procalcitonin 0.21 ng/mL    Comment:        Interpretation: PCT (Procalcitonin) <= 0.5 ng/mL: Systemic infection (sepsis) is not likely. Local bacterial infection is possible. (NOTE)         ICU PCT Algorithm               Non ICU PCT Algorithm    ----------------------------     ------------------------------         PCT < 0.25 ng/mL                 PCT < 0.1 ng/mL     Stopping of antibiotics            Stopping of antibiotics       strongly encouraged.               strongly encouraged.    ----------------------------     ------------------------------       PCT level decrease by                PCT < 0.25 ng/mL       >= 80% from peak PCT       OR PCT 0.25 - 0.5 ng/mL          Stopping of antibiotics                                             encouraged.     Stopping of antibiotics           encouraged.    ----------------------------     ------------------------------       PCT level decrease by              PCT >= 0.25 ng/mL       <  80% from peak PCT        AND PCT >= 0.5 ng/mL            Continuin g antibiotics                                              encouraged.       Continuing antibiotics            encouraged.    ----------------------------     ------------------------------     PCT level increase compared          PCT > 0.5 ng/mL         with peak PCT AND          PCT >= 0.5 ng/mL             Escalation of antibiotics                                          strongly encouraged.      Escalation of antibiotics        strongly encouraged.   HCV Comment:     Status: None   Collection Time: 07/03/15  3:30 AM  Result Value Ref Range   Comment: Comment     Comment: (NOTE) Non reactive HCV antibody screen is consistent with no HCV infection, unless recent infection is suspected or other evidence exists to indicate HCV infection. Performed At: [facility] [place], [place] [id number] Lindon Romp MD ZM:[id number]   Blood gas, arterial     Status: Abnormal   Collection Time: 07/03/15  3:56 AM  Result Value Ref Range   FIO2 0.40    Delivery systems VENTILATOR    Mode PRESSURE REGULATED VOLUME CONTROL    VT 620 mL   LHR 18 resp/min   Peep/cpap 5.0 cm H20   pH, Arterial 7.323 (L) 7.350 - 7.450   pCO2 arterial 48.4 (H) 35.0 - 45.0 mmHg   pO2, Arterial 61.1 (L) 80.0 - 100.0 mmHg   Bicarbonate 24.4 (H) 20.0 - 24.0 mEq/L   TCO2 25.9 0 - 100 mmol/L   Acid-base deficit 0.9 0.0 - 2.0 mmol/L   O2 Saturation 89.3 %   Patient temperature 98.5    Collection site LEFT RADIAL    Drawn by [phone]    Sample type ARTERIAL DRAW     Allens test (pass/fail) PASS PASS  Troponin I     Status: None   Collection Time: 07/03/15 10:52 AM  Result Value Ref Range   Troponin I <0.03 <0.031 ng/mL    Comment:        NO INDICATION OF MYOCARDIAL INJURY.   Heparin level (unfractionated)     Status: None   Collection Time: 07/03/15 10:52 AM  Result Value Ref Range   Heparin Unfractionated 0.68 0.30 - 0.70 IU/mL    Comment:        IF HEPARIN RESULTS ARE BELOW EXPECTED VALUES, AND PATIENT DOSAGE HAS BEEN CONFIRMED, SUGGEST FOLLOW UP TESTING OF ANTITHROMBIN III LEVELS.   Glucose, capillary     Status: Abnormal   Collection Time: 07/03/15 11:59 AM  Result Value Ref Range   Glucose-Capillary 123 (H) 65 - 99 mg/dL  Magnesium     Status: None  Collection Time: 07/03/15 12:05 PM  Result Value Ref Range   Magnesium 1.9 1.7 - 2.4 mg/dL  Phosphorus     Status: Abnormal   Collection Time: 07/03/15 12:05 PM  Result Value Ref Range   Phosphorus 2.0 (L) 2.5 - 4.6 mg/dL  Glucose, capillary     Status: Abnormal   Collection Time: 07/03/15  3:28 PM  Result Value Ref Range   Glucose-Capillary 135 (H) 65 - 99 mg/dL  Troponin I     Status: None   Collection Time: 07/03/15  3:45 PM  Result Value Ref Range   Troponin I <0.03 <0.031 ng/mL    Comment:        NO INDICATION OF MYOCARDIAL INJURY.   Glucose, capillary     Status: Abnormal   Collection Time: 07/03/15  8:02 PM  Result Value Ref Range   Glucose-Capillary 123 (H) 65 - 99 mg/dL  Troponin I     Status: None   Collection Time: 07/03/15 10:00 PM  Result Value Ref Range   Troponin I <0.03 <0.031 ng/mL    Comment:        NO INDICATION OF MYOCARDIAL INJURY.   Glucose, capillary     Status: Abnormal   Collection Time: 07/03/15 11:57 PM  Result Value Ref Range   Glucose-Capillary 118 (H) 65 - 99 mg/dL  Glucose, capillary     Status: Abnormal   Collection Time: 07/04/15  3:22 AM  Result Value Ref Range   Glucose-Capillary 136 (H) 65 - 99 mg/dL  Troponin I     Status: None    Collection Time: 07/04/15  3:23 AM  Result Value Ref Range   Troponin I <0.03 <0.031 ng/mL    Comment:        NO INDICATION OF MYOCARDIAL INJURY.   CBC     Status: Abnormal   Collection Time: 07/04/15  3:23 AM  Result Value Ref Range   WBC 13.8 (H) 4.0 - 10.5 K/uL   RBC 4.14 (L) 4.22 - 5.81 MIL/uL   Hemoglobin 12.1 (L) 13.0 - 17.0 g/dL   HCT 36.1 (L) 39.0 - 52.0 %   MCV 87.2 78.0 - 100.0 fL   MCH 29.2 26.0 - 34.0 pg   MCHC 33.5 30.0 - 36.0 g/dL   RDW 15.5 11.5 - 15.5 %   Platelets 264 150 - 400 K/uL  Phosphorus     Status: Abnormal   Collection Time: 07/04/15  3:23 AM  Result Value Ref Range   Phosphorus 1.9 (L) 2.5 - 4.6 mg/dL  Magnesium     Status: None   Collection Time: 07/04/15  3:23 AM  Result Value Ref Range   Magnesium 1.9 1.7 - 2.4 mg/dL  Basic metabolic panel     Status: Abnormal   Collection Time: 07/04/15  3:23 AM  Result Value Ref Range   Sodium 140 135 - 145 mmol/L   Potassium 3.6 3.5 - 5.1 mmol/L   Chloride 100 (L) 101 - 111 mmol/L   CO2 28 22 - 32 mmol/L   Glucose, Bld 133 (H) 65 - 99 mg/dL   BUN 15 6 - 20 mg/dL   Creatinine, Ser 1.13 0.61 - 1.24 mg/dL   Calcium 8.7 (L) 8.9 - 10.3 mg/dL   GFR calc non Af Amer >60 >60 mL/min   GFR calc Af Amer >60 >60 mL/min    Comment: (NOTE) The eGFR has been calculated using the CKD EPI equation. This calculation has not been validated in all clinical situations.  eGFR's persistently <60 mL/min signify possible Chronic Kidney Disease.    Anion gap 12 5 - 15  Heparin level (unfractionated)     Status: None   Collection Time: 07/04/15  3:24 AM  Result Value Ref Range   Heparin Unfractionated 0.39 0.30 - 0.70 IU/mL    Comment:        IF HEPARIN RESULTS ARE BELOW EXPECTED VALUES, AND PATIENT DOSAGE HAS BEEN CONFIRMED, SUGGEST FOLLOW UP TESTING OF ANTITHROMBIN III LEVELS.   Glucose, capillary     Status: Abnormal   Collection Time: 07/04/15  8:07 AM  Result Value Ref Range   Glucose-Capillary 135 (H) 65 - 99  mg/dL    Imaging: Dg Chest Port 1 View  07/03/2015  CLINICAL DATA:  Intubation. EXAM: PORTABLE CHEST 1 VIEW COMPARISON:  07/02/2015. FINDINGS: Endotracheal tube, NG tube, left subclavian line in stable position. Prior CABG. Heart size stable. No focal infiltrate. No pleural effusion or pneumothorax. IMPRESSION: 1. Stable chest. 2. Prior CABG.  Heart size stable.  No acute pulmonary disease. Electronically Signed   By: Pringle   On: 07/03/2015 07:18    Assessment:  Principal Problem:   Acute respiratory failure with hypercapnia (HCC) Active Problems:   Tobacco abuse   S/P CABG x 4   Cardiomyopathy, ischemic   Chronic obstructive pulmonary disease (HCC)   Essential hypertension   Cocaine abuse   Plan:  1. 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 At this point, he has not reported chest pain or CAD like symptoms.  Can stop the heparin.  Extubated today. He continues to remain approximately 3 liters positive, would diurese until his I=O in light of his EF of 35-40%. He Aaria Happ ultimately need an ischemia evaluation - consider myoview versus LHC. Start low dose aspirin 81 mg daily when patient is extubated (he does not need 325 mg) and restart lipitor at that time as well. He Heiress Williamson need therapy for cardiomyopathy in the future.  Tobechukwu Emmick start lisinopril today which can be titrated up for BP control, but Noland Pizano hold off on beta blockers as he does have cocaine history and this could cause dangerous hypertension if he continues to abuse.  Time Spent Directly with Patient:  35 minutes  CRITICAL CARE:  The patient is critically ill with multi-organ system failure and requires high complexity decision making for assessment and support, frequent evaluation and titration of therapies, application of advanced monitoring technologies and extensive interpretation of multiple databases.  Length of Stay:  LOS: 2 days   Rocky Rishel  Meredith Leeds 07/04/2015, 9:40 AM

## 2015-07-05 ENCOUNTER — Encounter (HOSPITAL_COMMUNITY): Payer: Self-pay

## 2015-07-05 LAB — BASIC METABOLIC PANEL
Anion gap: 9 (ref 5–15)
BUN: 15 mg/dL (ref 6–20)
CALCIUM: 8.6 mg/dL — AB (ref 8.9–10.3)
CHLORIDE: 103 mmol/L (ref 101–111)
CO2: 30 mmol/L (ref 22–32)
CREATININE: 1.11 mg/dL (ref 0.61–1.24)
Glucose, Bld: 97 mg/dL (ref 65–99)
Potassium: 3.1 mmol/L — ABNORMAL LOW (ref 3.5–5.1)
SODIUM: 142 mmol/L (ref 135–145)

## 2015-07-05 LAB — TROPONIN I
TROPONIN I: 0.04 ng/mL — AB (ref <0.031)
TROPONIN I: 0.04 ng/mL — AB (ref <0.031)

## 2015-07-05 LAB — CBC
HCT: 37.8 % — ABNORMAL LOW (ref 39.0–52.0)
Hemoglobin: 12.8 g/dL — ABNORMAL LOW (ref 13.0–17.0)
MCH: 29.4 pg (ref 26.0–34.0)
MCHC: 33.9 g/dL (ref 30.0–36.0)
MCV: 86.7 fL (ref 78.0–100.0)
PLATELETS: 267 10*3/uL (ref 150–400)
RBC: 4.36 MIL/uL (ref 4.22–5.81)
RDW: 15.4 % (ref 11.5–15.5)
WBC: 11.5 10*3/uL — AB (ref 4.0–10.5)

## 2015-07-05 LAB — GLUCOSE, CAPILLARY: Glucose-Capillary: 127 mg/dL — ABNORMAL HIGH (ref 65–99)

## 2015-07-05 LAB — MAGNESIUM: MAGNESIUM: 2.3 mg/dL (ref 1.7–2.4)

## 2015-07-05 LAB — PHOSPHORUS: PHOSPHORUS: 1.9 mg/dL — AB (ref 2.5–4.6)

## 2015-07-05 MED ORDER — POTASSIUM CHLORIDE CRYS ER 20 MEQ PO TBCR
40.0000 meq | EXTENDED_RELEASE_TABLET | Freq: Every day | ORAL | Status: DC
  Administered 2015-07-05 – 2015-07-06 (×2): 40 meq via ORAL
  Filled 2015-07-05 (×2): qty 2

## 2015-07-05 MED ORDER — POTASSIUM & SODIUM PHOSPHATES 280-160-250 MG PO PACK
1.0000 | PACK | Freq: Three times a day (TID) | ORAL | Status: DC
  Administered 2015-07-06 (×3): 1 via ORAL
  Filled 2015-07-05 (×7): qty 1

## 2015-07-05 MED ORDER — IPRATROPIUM-ALBUTEROL 0.5-2.5 (3) MG/3ML IN SOLN
3.0000 mL | Freq: Three times a day (TID) | RESPIRATORY_TRACT | Status: DC
  Administered 2015-07-06: 3 mL via RESPIRATORY_TRACT
  Filled 2015-07-05: qty 3

## 2015-07-05 MED ORDER — INFLUENZA VAC SPLIT QUAD 0.5 ML IM SUSY
0.5000 mL | PREFILLED_SYRINGE | INTRAMUSCULAR | Status: AC
  Administered 2015-07-06: 0.5 mL via INTRAMUSCULAR
  Filled 2015-07-05: qty 0.5

## 2015-07-05 NOTE — Progress Notes (Addendum)
SUBJECTIVE:  Feels well. No chest pain.  He is hoping to go home soon.    OBJECTIVE:   Vitals:   Filed Vitals:   07/05/15 0537 07/05/15 0557 07/05/15 0919 07/05/15 1151  BP: 155/79     Pulse: 89     Temp: 98.9 F (37.2 C)     TempSrc: Oral     Resp: 18     Height:      Weight: 188 lb 14.4 oz (85.684 kg)     SpO2: 94% 95% 95% 95%   I&O's:   Intake/Output Summary (Last 24 hours) at 07/05/15 1313 Last data filed at 07/05/15 0000  Gross per 24 hour  Intake      0 ml  Output    900 ml  Net   -900 ml   TELEMETRY: Reviewed telemetry pt in NSR:     PHYSICAL EXAM General: Well developed, well nourished, in no acute distress Head:   Normal cephalic and atramatic  Lungs:   Clear bilaterally to auscultation. Heart:   HRRR S1 S2  No JVD.   Abdomen: abdomen soft and non-tender Msk:  Back normal,  Normal strength and tone for age. Extremities:   No edema.   Neuro: Alert and oriented. Psych:  Normal affect, responds appropriately Skin: No rash   LABS: Basic Metabolic Panel:  Recent Labs  07/04/15 0323 07/05/15 0553  NA 140 142  K 3.6 3.1*  CL 100* 103  CO2 28 30  GLUCOSE 133* 97  BUN 15 15  CREATININE 1.13 1.11  CALCIUM 8.7* 8.6*  MG 1.9 2.3  PHOS 1.9* 1.9*   Liver Function Tests: No results for input(s): AST, ALT, ALKPHOS, BILITOT, PROT, ALBUMIN in the last 72 hours. No results for input(s): LIPASE, AMYLASE in the last 72 hours. CBC:  Recent Labs  07/04/15 0323 07/05/15 0553  WBC 13.8* 11.5*  HGB 12.1* 12.8*  HCT 36.1* 37.8*  MCV 87.2 86.7  PLT 264 267   Cardiac Enzymes:  Recent Labs  07/04/15 2334 07/05/15 0553 07/05/15 1110  TROPONINI 0.04* <0.03 0.04*   BNP: Invalid input(s): POCBNP D-Dimer: No results for input(s): DDIMER in the last 72 hours. Hemoglobin A1C: No results for input(s): HGBA1C in the last 72 hours. Fasting Lipid Panel: No results for input(s): CHOL, HDL, LDLCALC, TRIG, CHOLHDL, LDLDIRECT in the last 72 hours. Thyroid  Function Tests: No results for input(s): TSH, T4TOTAL, T3FREE, THYROIDAB in the last 72 hours.  Invalid input(s): FREET3 Anemia Panel: No results for input(s): VITAMINB12, FOLATE, FERRITIN, TIBC, IRON, RETICCTPCT in the last 72 hours. Coag Panel:   Lab Results  Component Value Date   INR 1.07 07/02/2015   INR 0.92 09/01/2014   INR 1.25 10/31/2013    RADIOLOGY: Dg Chest Port 1 View  07/03/2015  CLINICAL DATA:  Intubation. EXAM: PORTABLE CHEST 1 VIEW COMPARISON:  07/02/2015. FINDINGS: Endotracheal tube, NG tube, left subclavian line in stable position. Prior CABG. Heart size stable. No focal infiltrate. No pleural effusion or pneumothorax. IMPRESSION: 1. Stable chest. 2. Prior CABG.  Heart size stable.  No acute pulmonary disease. Electronically Signed   By: Marcello Moores  Register   On: 07/03/2015 07:18   Dg Chest Portable 1 View  07/02/2015  CLINICAL DATA:  Intubated, risk prior tori distress EXAM: PORTABLE CHEST 1 VIEW COMPARISON:  07/02/2015 FINDINGS: Endotracheal tube with the tip 6.9 cm above the carina. Nasogastric tube coursing below the diaphragm. There is no focal parenchymal opacity. There is no pleural effusion or pneumothorax.  The heart and mediastinal contours are unremarkable. There is evidence of prior CABG. The osseous structures are unremarkable. IMPRESSION: 1. No significant interval change. 2. Endotracheal tube with the tip 6.9 cm above the carina. Electronically Signed   By: Kathreen Devoid   On: 07/02/2015 09:43   Dg Chest Portable 1 View  07/02/2015  CLINICAL DATA:  Status post central line placement. EXAM: PORTABLE CHEST 1 VIEW COMPARISON:  July 02, 2015 FINDINGS: A left subclavian line has been placed in the interval. The distal tip appears to be in the central SVC. No pneumothorax. The cardiomediastinal silhouette is stable. The lung bases were not completely included on today's study. However, within visualize limits, no acute interval changes are otherwise seen. ET tube  remains in good position. The NG tube terminates below today's film. IMPRESSION: Interval placement of a left subclavian line as described above without pneumothorax. No other interval changes. Electronically Signed   By: Dorise Bullion III M.D   On: 07/02/2015 09:31   Dg Chest Port 1 View  07/02/2015  CLINICAL DATA:  Shortness of breath, intubation, worsening respiratory distress this morning, COPD, coronary artery disease post MI and CABG, hypertension EXAM: PORTABLE CHEST 1 VIEW COMPARISON:  Portable exam 0703 hours compared to 09/01/2014 FINDINGS: Tip of endotracheal tube projects approximately 7.2 cm above carina. Nasogastric tube extends into stomach. Normal heart size post CABG. Mediastinal contours and pulmonary vascularity normal. Emphysematous changes without infiltrate, pleural effusion or pneumothorax. No acute osseous findings. IMPRESSION: Tube positions as above. COPD changes and evidence of prior CABG. No acute abnormalities. Electronically Signed   By: Lavonia Dana M.D.   On: 07/02/2015 07:21      ASSESSMENT: Kathyrn Lass:    1) CAD: Cardiomyopathy is new from 2015.  No angina at this time.  Would be comfortable with medical therapy for now.  F/u as outpatient for repeat echocardiogram.  Due to substance abuse and compliance issues, he was sent to CABG instead of PCI in 2015.  He still has the substance abuse issue and question of compliance, so I don't think the safety of DES and DAPT in this patient has changed from 2015.  Given his lack of sx and insignificant troponins, medical therapy with ACE-I and cardiology f/u makes the most sense.  Avoid beta blocker given ongoing cocaine use.  He needs to stop using cocaine.  HTN: Borderline.  Treat with ACE-I.  Continue statin and smoking cessation counseling.  Will schedule f/u echo for 4-6 weeks.  Jettie Booze, MD  07/05/2015  1:13 PM

## 2015-07-05 NOTE — Progress Notes (Signed)
Pt triponin slightly elevated 0.04. MD notified. Will continue to monitor.

## 2015-07-05 NOTE — Progress Notes (Signed)
Nurse report called to nurse [name], pt gcs15 no apparent sob, room air oxygen, denies cp, vss

## 2015-07-05 NOTE — Progress Notes (Signed)
Patient arrived from [place] ICU. Pt oriented to room, call bell and call light. CCmd called and verified. Denies pain and no apparent sob. Will continue to monitor closely.

## 2015-07-05 NOTE — Progress Notes (Signed)
[name] [id number] DOB: July 01, 1955 DOA: 07/02/2015 PCP: Elizabeth Palau, MD  Brief narrative: 60 y/o ? CABG x 4  10/2013 COPD continued smoker Htn Hld PUD Drug [cocaine] etoh abuse  Admitted by [facility] 1/19 with acute hypoxic resp failure Intubated place don Vent beame unresponsive and hypotensive Covered initially with azithro/rocephin and tamiflu Found to have ST-T depressions and cardiology consulted   STUDIES:  Chest Xray 07/02/15: Emphysematous changes without infiltrate, pleural effusion, or pneumothorax, COPD changes and evidence of prior CABG, no acute abnormalities.   CULTURES: Urine Culture 06/30/2015 >> Blood Culture x 2 06/30/15 >> Sputum Culture 06/30/15 >> rvp 1/19>>> NEG  ANTIBIOTICS: Azithromycin 500 mg 07/02/2015 >>1/21 Rocephin 1 gm 07/02/2015 >>1/21 Tamiflu 07/02/2015>>>1/21 Doxy po 1/21 > (4 days)  LINES/TUBES: ETT 06/30/2015 >>1/20 Left [place] CVL 1/19>>1/21  SIGNIFICANT EVENTS: 1/19 intubated for respiratory failure, pan-cultures drawn, pressors started for BP support with known severe dHFand moderate sHF    Subjective  Well coughing dark sputum No cp No wheeze No le edema   Objective    Interim History:   Telemetry: sinus   Objective: Filed Vitals:   07/05/15 0134 07/05/15 0523 07/05/15 0537 07/05/15 0557  BP: 117/82  155/79   Pulse: 93  89   Temp: 98.4 F (36.9 C)  98.9 F (37.2 C)   TempSrc: Oral  Oral   Resp: 18  18   Height:  6' 0.01" (1.829 m)    Weight:   85.684 kg (188 lb 14.4 oz)   SpO2: 95%  94% 95%    Intake/Output Summary (Last 24 hours) at 07/05/15 0740 Last data filed at 07/05/15 0000  Gross per 24 hour  Intake  144.5 ml  Output   1900 ml  Net -1755.5 ml    Exam:  General: eomi ncat Cardiovascular: s1 s2 no m/r/g Respiratory: clear no added soudn Abdomen:  Soft nt nd no rebond Skin no le edema Neuro intact  Data Reviewed: Basic Metabolic Panel:  Recent Labs Lab 07/02/15 0705  07/02/15 1134 07/03/15 0330 07/03/15 1205 07/04/15 0323  NA 139 141 142  --  140  K 3.9 4.1 3.9  --  3.6  CL 106 106 109  --  100*  CO2 25 26 26   --  28  GLUCOSE 213* 162* 135*  --  133*  BUN 10 9 12   --  15  CREATININE 1.21 1.25* 1.23  --  1.13  CALCIUM 8.4* 8.1* 8.2*  --  8.7*  MG  --   --  1.8 1.9 1.9  PHOS  --   --  3.1 2.0* 1.9*   Liver Function Tests:  Recent Labs Lab 07/02/15 0705 07/02/15 1134  AST 24 20  ALT 18 17  ALKPHOS 110 89  BILITOT 0.5 0.5  PROT 6.6 5.7*  ALBUMIN 3.6 3.1*   No results for input(s): LIPASE, AMYLASE in the last 168 hours. No results for input(s): AMMONIA in the last 168 hours. CBC:  Recent Labs Lab 07/02/15 0705 07/02/15 1134 07/03/15 0330 07/04/15 0323 07/05/15 0553  WBC 8.4 6.7 8.3 13.8* 11.5*  NEUTROABS 3.1 5.2  --   --   --   HGB 13.9 12.1* 11.6* 12.1* 12.8*  HCT 42.0 37.2* 36.0* 36.1* 37.8*  MCV 88.1 88.8 89.1 87.2 86.7  PLT 319 272 259 264 267   Cardiac Enzymes:  Recent Labs Lab 07/03/15 2200 07/04/15 0323 07/04/15 1006 07/04/15 1651 07/04/15 2334  TROPONINI <0.03 <0.03 0.04* 0.04* 0.04*   BNP:  Invalid input(s): POCBNP CBG:  Recent Labs Lab 07/04/15 0807 07/04/15 1157 07/04/15 1557 07/04/15 1952 07/05/15 0007  GLUCAP 135* 127* 174* 158* 127*    Recent Results (from the past 240 hour(s))  Urine culture     Status: None   Collection Time: 07/02/15  7:19 AM  Result Value Ref Range Status   Specimen Description URINE, CATHETERIZED  Final   Special Requests NONE  Final   Culture 1,000 COLONIES/mL INSIGNIFICANT GROWTH  Final   Report Status 07/03/2015 FINAL  Final  Culture, blood (Routine X 2) w Reflex to ID Panel     Status: None (Preliminary result)   Collection Time: 07/02/15  7:37 AM  Result Value Ref Range Status   Specimen Description BLOOD BLOOD RIGHT WRIST  Final   Special Requests IN PEDIATRIC BOTTLE [place]  Final   Culture NO GROWTH 2 DAYS  Final   Report Status PENDING  Incomplete  Culture,  blood (Routine X 2) w Reflex to ID Panel     Status: None (Preliminary result)   Collection Time: 07/02/15  8:21 AM  Result Value Ref Range Status   Specimen Description BLOOD BLOOD RIGHT WRIST  Final   Special Requests BOTTLES DRAWN AEROBIC AND ANAEROBIC 5CC  Final   Culture NO GROWTH 2 DAYS  Final   Report Status PENDING  Incomplete  Urine culture     Status: None   Collection Time: 07/02/15 10:06 AM  Result Value Ref Range Status   Specimen Description URINE, CATHETERIZED  Final   Special Requests NONE  Final   Culture NO GROWTH 1 DAY  Final   Report Status 07/03/2015 FINAL  Final  Respiratory virus panel     Status: None   Collection Time: 07/02/15 12:09 PM  Result Value Ref Range Status   Respiratory Syncytial Virus A Negative Negative Final   Respiratory Syncytial Virus B Negative Negative Final   Influenza A Negative Negative Final   Influenza B Negative Negative Final   Parainfluenza 1 Negative Negative Final   Parainfluenza 2 Negative Negative Final   Parainfluenza 3 Negative Negative Final   Metapneumovirus Negative Negative Final   Rhinovirus Negative Negative Final   Adenovirus Negative Negative Final    Comment: (NOTE) Performed At: [facility] [place], [place] [id number] Lindon Romp MD [id number]   MRSA PCR Screening     Status: Abnormal   Collection Time: 07/02/15 12:10 PM  Result Value Ref Range Status   MRSA by PCR POSITIVE (A) NEGATIVE Final    Comment:        The GeneXpert MRSA Assay (FDA approved for NASAL specimens only), is one component of a comprehensive MRSA colonization surveillance program. It is not intended to diagnose MRSA infection nor to guide or monitor treatment for MRSA infections. RESULT CALLED TO, READ BACK BY AND VERIFIED WITH: P MCKINNEY,RN AT 1409 07/02/15 BY L BENFIELD   Culture, respiratory (NON-Expectorated)     Status: None   Collection Time: 07/02/15  3:29 PM  Result Value Ref Range Status    Specimen Description TRACHEAL ASPIRATE  Final   Special Requests NONE  Final   Gram Stain   Final    MODERATE WBC PRESENT,BOTH PMN AND MONONUCLEAR FEW SQUAMOUS EPITHELIAL CELLS PRESENT FEW GRAM POSITIVE COCCI IN PAIRS RARE GRAM NEGATIVE RODS Performed at [facility]    Culture   Final    FEW STREPTOCOCCUS GROUP G Note: Beta hemolytic streptococci are predictably susceptible to penicillin and other  beta lactams. Susceptibility testing not routinely performed. Performed at [facility]    Report Status 07/04/2015 FINAL  Final     Studies:              All Imaging reviewed and is as per above notation   Scheduled Meds: . aspirin  81 mg Oral Daily  . atorvastatin  10 mg Oral q1800  . Chlorhexidine Gluconate Cloth  6 each Topical Q0600  . doxycycline  100 mg Oral Q12H  . furosemide  40 mg Intravenous Daily  . heparin subcutaneous  5,000 Units Subcutaneous 3 times per day  . ipratropium-albuterol  3 mL Nebulization Q4H  . lisinopril  10 mg Oral Daily  . mupirocin ointment  1 application Nasal BID  . predniSONE  40 mg Oral Q breakfast   Continuous Infusions: . sodium chloride 10 mL/hr at 07/05/15 0516     Assessment/Plan:  1. Circulatory shock 2/2 to cocaine needing pressors initialy-resolved. Will need isch eval at some point  No heparin.  Cont asa 81.  Cardiology to weigh in 2. CMopathy-continue lisinopril 10.  Consider alpha blocker.  Rest per cards 3. Acute hypoxic resp failure 2/2 COPD transitioned solu-medrol to Prednisone 1/22.  Respiratory panel neg-d/c tamiflu.  complete doxy 1/24 4. aki-resolved.  Keep i/o even. D/c lasix 40 IV od 5. mildly elevated troponin 2/2 to #2 6. Hypokalemia-replace po kdur 40 daily 7. Leukocytosis 2/2 to steroid 8. impaired lucose tolerance.  monitor    D/w brother bedisde and patient Keep tele Monitor and consider LHC or stress D/c soon?  Verneita Griffes, MD  [facility] Pager [phone] 07/05/2015, 7:40  AM    LOS: 3 days

## 2015-07-06 LAB — BASIC METABOLIC PANEL
Anion gap: 7 (ref 5–15)
BUN: 12 mg/dL (ref 6–20)
CHLORIDE: 104 mmol/L (ref 101–111)
CO2: 29 mmol/L (ref 22–32)
CREATININE: 1.03 mg/dL (ref 0.61–1.24)
Calcium: 8.6 mg/dL — ABNORMAL LOW (ref 8.9–10.3)
Glucose, Bld: 94 mg/dL (ref 65–99)
POTASSIUM: 3.6 mmol/L (ref 3.5–5.1)
SODIUM: 140 mmol/L (ref 135–145)

## 2015-07-06 LAB — CBC
HCT: 40.4 % (ref 39.0–52.0)
Hemoglobin: 13.7 g/dL (ref 13.0–17.0)
MCH: 29.2 pg (ref 26.0–34.0)
MCHC: 33.9 g/dL (ref 30.0–36.0)
MCV: 86.1 fL (ref 78.0–100.0)
PLATELETS: 279 10*3/uL (ref 150–400)
RBC: 4.69 MIL/uL (ref 4.22–5.81)
RDW: 15.2 % (ref 11.5–15.5)
WBC: 12.3 10*3/uL — AB (ref 4.0–10.5)

## 2015-07-06 LAB — PHOSPHORUS: Phosphorus: 2.3 mg/dL — ABNORMAL LOW (ref 2.5–4.6)

## 2015-07-06 LAB — TROPONIN I

## 2015-07-06 LAB — MAGNESIUM: Magnesium: 2.2 mg/dL (ref 1.7–2.4)

## 2015-07-06 MED ORDER — ATORVASTATIN CALCIUM 80 MG PO TABS: 80.0000 mg | ORAL_TABLET | Freq: Every day | ORAL | Status: DC

## 2015-07-06 MED ORDER — POTASSIUM & SODIUM PHOSPHATES 280-160-250 MG PO PACK: 1.0000 | PACK | Freq: Three times a day (TID) | ORAL | Status: DC

## 2015-07-06 MED ORDER — POTASSIUM CHLORIDE CRYS ER 20 MEQ PO TBCR: 40.0000 meq | EXTENDED_RELEASE_TABLET | Freq: Every day | ORAL | Status: DC

## 2015-07-06 MED ORDER — IPRATROPIUM-ALBUTEROL 0.5-2.5 (3) MG/3ML IN SOLN: 3.0000 mL | Freq: Four times a day (QID) | RESPIRATORY_TRACT | Status: DC | PRN

## 2015-07-06 MED ORDER — PREDNISONE 20 MG PO TABS
40.0000 mg | ORAL_TABLET | Freq: Every day | ORAL | Status: DC
Start: 2015-07-06 — End: 2015-10-06

## 2015-07-06 MED ORDER — DOXYCYCLINE HYCLATE 100 MG PO TABS: 100.0000 mg | ORAL_TABLET | Freq: Two times a day (BID) | ORAL | Status: DC

## 2015-07-06 MED ORDER — NICOTINE 21 MG/24HR TD PT24: 21.0000 mg | MEDICATED_PATCH | Freq: Every day | TRANSDERMAL | Status: DC

## 2015-07-06 NOTE — Progress Notes (Signed)
Discussed with the patient and all questioned fully answered. He will call me if any problems arise. Pt explained that he did not have access to his old medications (Lipitor, Duoneb, nicotine patch). Paged Dr. Verlon Au and prescriptions rewritten for 1 month supply until the pt starts care with PCP at [facility].  Fritz Pickerel, RN

## 2015-07-06 NOTE — Discharge Summary (Signed)
Physician Discharge Summary  [name] [id number] DOB: 1955-12-02 DOA: 07/02/2015  PCP: Elizabeth Palau, MD  Admit date: 07/02/2015 Discharge date: 07/06/2015  Time spent: 35 minutes  Recommendations for Outpatient Follow-up:  1. Needs Priamry care follow up 2. Needs bmet and phos 1 week-continue supplementation 3. Patient given Cardiology follow-up #.  Needs rpt Echo/LHC as OP 4. No beta blockers until can be confirmed patient not doing cocaine 5. Encouraged to stop smoking  Discharge Diagnoses:  Principal Problem:   Acute respiratory failure with hypercapnia (HCC) Active Problems:   Tobacco abuse   S/P CABG x 4   Cardiomyopathy, ischemic   Chronic obstructive pulmonary disease (HCC)   Essential hypertension   Cocaine abuse   Discharge Condition: fair  Diet recommendation: hh low salt  Filed Weights   07/04/15 0300 07/05/15 0537 07/06/15 0543  Weight: 85.4 kg (188 lb 4.4 oz) 85.684 kg (188 lb 14.4 oz) 83.099 kg (183 lb 3.2 oz)    History of present illness:  60 y/o ? CABG x 4 10/2013 COPD continued smoker Htn Hld PUD Drug [cocaine] etoh abuse  Admitted by [facility] 1/19 with acute hypoxic resp failure Intubated place don Vent beame unresponsive and hypotensive Covered initially with azithro/rocephin and tamiflu Found to have ST-T depressions and cardiology consulted  Hospital Course:  1. Circulatory shock 2/2 to cocaine needing pressors initialy-resolved.  No heparin. Cont asa 325 on d/c home. Cardiology felt not really safe to consider further invasive management, and rec ECHo 4 weeks.  Given number for patient to ge tthis scheudled 2. CMopathy-continue lisinopril 10. Consider alpha blocker as OP 3. Acute hypoxic resp failure 2/2 COPD transitioned solu-medrol to Prednisone 1/22, complete prednisone ~ 5-6 days post d/c Respiratory panel neg-d/c tamiflu. complete doxy 1/24 4. aki-resolved. Keep i/o even. D/c lasix 40 IV od 5. mildly elevated  troponin 2/2 to #2 6. Hypokalemia-replace po kdur 40 daily 7. Leukocytosis 2/2 to steroid 8. impaired lucose tolerance.   Consultations:  Cardiology assumed carefrom CCM 1/22  Discharge Exam: Filed Vitals:   07/05/15 2129 07/06/15 0543  BP: 122/69 160/89  Pulse: 75 74  Temp: 97.8 F (36.6 C) 98.2 F (36.8 C)  Resp: 17 18    General: eomi ncat Cardiovascular:  s1 s2 no m/r/g Respiratory: clear no added sound  Discharge Instructions   Discharge Instructions    Diet - low sodium heart healthy    Complete by:  As directed      Discharge instructions    Complete by:  As directed   Stop smoking Continue prednisone for 4 more days Complete Doxycylcine in 2 days contineu breahtign treatements and follow up with a primary MD You should follow with a cardiologist in 1 month on discharge You will need lab work in 1-2 weeks to check your potassium and phosphorous     Increase activity slowly    Complete by:  As directed           Current Discharge Medication List    START taking these medications   Details  doxycycline (VIBRA-TABS) 100 MG tablet Take 1 tablet (100 mg total) by mouth every 12 (twelve) hours. Qty: 4 tablet, Refills: 0    potassium & sodium phosphates (PHOS-NAK) 280-160-250 MG PACK Take 1 packet by mouth 4 (four) times daily -  with meals and at bedtime. Qty: 120 packet, Refills: 0    potassium chloride SA (K-DUR,KLOR-CON) 20 MEQ tablet Take 2 tablets (40 mEq total) by mouth daily. Qty: 15 tablet,  Refills: 0    predniSONE (DELTASONE) 20 MG tablet Take 2 tablets (40 mg total) by mouth daily with breakfast. Qty: 10 tablet, Refills: 0      CONTINUE these medications which have NOT CHANGED   Details  atorvastatin (LIPITOR) 80 MG tablet Take 1 tablet (80 mg total) by mouth daily at 6 PM.    fluticasone (FLONASE) 50 MCG/ACT nasal spray Place 2 sprays into both nostrils daily. Qty: 16 g, Refills: 0    Ipratropium-Albuterol (COMBIVENT) 20-100 MCG/ACT AERS  respimat Inhale 1 puff into the lungs every 6 (six) hours as needed for wheezing or shortness of breath. Qty: 1 Inhaler, Refills: 0    mometasone-formoterol (DULERA) 100-5 MCG/ACT AERO Inhale 2 puffs into the lungs 2 (two) times daily. Qty: 1 Inhaler, Refills: 0    nicotine (NICODERM CQ - DOSED IN MG/24 HOURS) 14 mg/24hr patch Place 1 patch (14 mg total) onto the skin daily. Qty: 7 patch, Refills: 0    aspirin EC 325 MG EC tablet Take 1 tablet (325 mg total) by mouth daily.      STOP taking these medications     carvedilol (COREG) 12.5 MG tablet      nicotine (NICODERM CQ - DOSED IN MG/24 HR) 7 mg/24hr patch        No Known Allergies    The results of significant diagnostics from this hospitalization (including imaging, microbiology, ancillary and laboratory) are listed below for reference.    Significant Diagnostic Studies: Dg Chest Port 1 View  07/03/2015  CLINICAL DATA:  Intubation. EXAM: PORTABLE CHEST 1 VIEW COMPARISON:  07/02/2015. FINDINGS: Endotracheal tube, NG tube, left subclavian line in stable position. Prior CABG. Heart size stable. No focal infiltrate. No pleural effusion or pneumothorax. IMPRESSION: 1. Stable chest. 2. Prior CABG.  Heart size stable.  No acute pulmonary disease. Electronically Signed   By: Marcello Moores  Register   On: 07/03/2015 07:18   Dg Chest Portable 1 View  07/02/2015  CLINICAL DATA:  Intubated, risk prior tori distress EXAM: PORTABLE CHEST 1 VIEW COMPARISON:  07/02/2015 FINDINGS: Endotracheal tube with the tip 6.9 cm above the carina. Nasogastric tube coursing below the diaphragm. There is no focal parenchymal opacity. There is no pleural effusion or pneumothorax. The heart and mediastinal contours are unremarkable. There is evidence of prior CABG. The osseous structures are unremarkable. IMPRESSION: 1. No significant interval change. 2. Endotracheal tube with the tip 6.9 cm above the carina. Electronically Signed   By: Kathreen Devoid   On: 07/02/2015  09:43   Dg Chest Portable 1 View  07/02/2015  CLINICAL DATA:  Status post central line placement. EXAM: PORTABLE CHEST 1 VIEW COMPARISON:  July 02, 2015 FINDINGS: A left subclavian line has been placed in the interval. The distal tip appears to be in the central SVC. No pneumothorax. The cardiomediastinal silhouette is stable. The lung bases were not completely included on today's study. However, within visualize limits, no acute interval changes are otherwise seen. ET tube remains in good position. The NG tube terminates below today's film. IMPRESSION: Interval placement of a left subclavian line as described above without pneumothorax. No other interval changes. Electronically Signed   By: Dorise Bullion III M.D   On: 07/02/2015 09:31   Dg Chest Port 1 View  07/02/2015  CLINICAL DATA:  Shortness of breath, intubation, worsening respiratory distress this morning, COPD, coronary artery disease post MI and CABG, hypertension EXAM: PORTABLE CHEST 1 VIEW COMPARISON:  Portable exam 0703 hours compared to 09/01/2014  FINDINGS: Tip of endotracheal tube projects approximately 7.2 cm above carina. Nasogastric tube extends into stomach. Normal heart size post CABG. Mediastinal contours and pulmonary vascularity normal. Emphysematous changes without infiltrate, pleural effusion or pneumothorax. No acute osseous findings. IMPRESSION: Tube positions as above. COPD changes and evidence of prior CABG. No acute abnormalities. Electronically Signed   By: Lavonia Dana M.D.   On: 07/02/2015 07:21    Microbiology: Recent Results (from the past 240 hour(s))  Urine culture     Status: None   Collection Time: 07/02/15  7:19 AM  Result Value Ref Range Status   Specimen Description URINE, CATHETERIZED  Final   Special Requests NONE  Final   Culture 1,000 COLONIES/mL INSIGNIFICANT GROWTH  Final   Report Status 07/03/2015 FINAL  Final  Culture, blood (Routine X 2) w Reflex to ID Panel     Status: None (Preliminary result)    Collection Time: 07/02/15  7:37 AM  Result Value Ref Range Status   Specimen Description BLOOD BLOOD RIGHT WRIST  Final   Special Requests IN PEDIATRIC BOTTLE Parchment  Final   Culture NO GROWTH 3 DAYS  Final   Report Status PENDING  Incomplete  Culture, blood (Routine X 2) w Reflex to ID Panel     Status: None (Preliminary result)   Collection Time: 07/02/15  8:21 AM  Result Value Ref Range Status   Specimen Description BLOOD BLOOD RIGHT WRIST  Final   Special Requests BOTTLES DRAWN AEROBIC AND ANAEROBIC 5CC  Final   Culture NO GROWTH 3 DAYS  Final   Report Status PENDING  Incomplete  Urine culture     Status: None   Collection Time: 07/02/15 10:06 AM  Result Value Ref Range Status   Specimen Description URINE, CATHETERIZED  Final   Special Requests NONE  Final   Culture NO GROWTH 1 DAY  Final   Report Status 07/03/2015 FINAL  Final  Respiratory virus panel     Status: None   Collection Time: 07/02/15 12:09 PM  Result Value Ref Range Status   Respiratory Syncytial Virus A Negative Negative Final   Respiratory Syncytial Virus B Negative Negative Final   Influenza A Negative Negative Final   Influenza B Negative Negative Final   Parainfluenza 1 Negative Negative Final   Parainfluenza 2 Negative Negative Final   Parainfluenza 3 Negative Negative Final   Metapneumovirus Negative Negative Final   Rhinovirus Negative Negative Final   Adenovirus Negative Negative Final    Comment: (NOTE) Performed At: [facility] [place], [place] [id number] Lindon Romp MD [id number]   MRSA PCR Screening     Status: Abnormal   Collection Time: 07/02/15 12:10 PM  Result Value Ref Range Status   MRSA by PCR POSITIVE (A) NEGATIVE Final    Comment:        The GeneXpert MRSA Assay (FDA approved for NASAL specimens only), is one component of a comprehensive MRSA colonization surveillance program. It is not intended to diagnose MRSA infection nor to guide or monitor  treatment for MRSA infections. RESULT CALLED TO, READ BACK BY AND VERIFIED WITH: P MCKINNEY,RN AT 1409 07/02/15 BY L BENFIELD   Culture, respiratory (NON-Expectorated)     Status: None   Collection Time: 07/02/15  3:29 PM  Result Value Ref Range Status   Specimen Description TRACHEAL ASPIRATE  Final   Special Requests NONE  Final   Gram Stain   Final    MODERATE WBC PRESENT,BOTH PMN AND MONONUCLEAR FEW  SQUAMOUS EPITHELIAL CELLS PRESENT FEW GRAM POSITIVE COCCI IN PAIRS RARE GRAM NEGATIVE RODS Performed at [facility]    Culture   Final    FEW STREPTOCOCCUS GROUP G Note: Beta hemolytic streptococci are predictably susceptible to penicillin and other beta lactams. Susceptibility testing not routinely performed. Performed at [facility]    Report Status 07/04/2015 FINAL  Final     Labs: Basic Metabolic Panel:  Recent Labs Lab 07/02/15 1134 07/03/15 0330 07/03/15 1205 07/04/15 0323 07/05/15 0553 07/06/15 0350  NA 141 142  --  140 142 140  K 4.1 3.9  --  3.6 3.1* 3.6  CL 106 109  --  100* 103 104  CO2 26 26  --  28 30 29   GLUCOSE 162* 135*  --  133* 97 94  BUN 9 12  --  15 15 12   CREATININE 1.25* 1.23  --  1.13 1.11 1.03  CALCIUM 8.1* 8.2*  --  8.7* 8.6* 8.6*  MG  --  1.8 1.9 1.9 2.3 2.2  PHOS  --  3.1 2.0* 1.9* 1.9* 2.3*   Liver Function Tests:  Recent Labs Lab 07/02/15 0705 07/02/15 1134  AST 24 20  ALT 18 17  ALKPHOS 110 89  BILITOT 0.5 0.5  PROT 6.6 5.7*  ALBUMIN 3.6 3.1*   No results for input(s): LIPASE, AMYLASE in the last 168 hours. No results for input(s): AMMONIA in the last 168 hours. CBC:  Recent Labs Lab 07/02/15 0705 07/02/15 1134 07/03/15 0330 07/04/15 0323 07/05/15 0553 07/06/15 0350  WBC 8.4 6.7 8.3 13.8* 11.5* 12.3*  NEUTROABS 3.1 5.2  --   --   --   --   HGB 13.9 12.1* 11.6* 12.1* 12.8* 13.7  HCT 42.0 37.2* 36.0* 36.1* 37.8* 40.4  MCV 88.1 88.8 89.1 87.2 86.7 86.1  PLT 319 272 259 264 267 279   Cardiac  Enzymes:  Recent Labs Lab 07/05/15 0553 07/05/15 1110 07/05/15 1557 07/05/15 2330 07/06/15 0350  TROPONINI <0.03 0.04* <0.03 <0.03 <0.03   BNP: BNP (last 3 results)  Recent Labs  09/01/14 0745 07/02/15 0705 07/02/15 1134  BNP 226.5* 253.3* 178.8*    ProBNP (last 3 results) No results for input(s): PROBNP in the last 8760 hours.  CBG:  Recent Labs Lab 07/04/15 0807 07/04/15 1157 07/04/15 1557 07/04/15 1952 07/05/15 0007  GLUCAP 135* 127* 174* 158* 127*       Signed:  Nita Sells MD   [facility] 07/06/2015, 8:41 AM

## 2015-07-06 NOTE — Care Management Note (Addendum)
Case Management Note Marvetta Gibbons RN, BSN Unit 2W-Case Manager [phone]  Patient Details  Name: [name] MRN: [id number] Date of Birth: 1955-11-26  Subjective/Objective:   Pt admitted with acute resp. failure                Action/Plan: PTA pt lived at home, independent- pt PCP was Dr. Kennon Holter- needs new PCP- spoke with pt had bedside regarding clinic options. Pt does not want to stay with the [facility] and would prefer a new clinic- discussed the [facility] and the [facility]- appointment made with the [facility] as the [facility] is not currently taking new pt- appointment placed on AVS and info given to pt on appointment and on both clinics addresses. Pt can use [facility]. Pt reports that he should not have any difficulty with getting his meds.  Expected Discharge Date:  07/06/15               Expected Discharge Plan:  Home/Self Care  In-House Referral:     Discharge planning Services  CM Consult, Follow-up appt scheduled, [facility]  Post Acute Care Choice:    Choice offered to:     DME Arranged:    DME Agency:     HH Arranged:    HH Agency:     Status of Service:  Completed, signed off  Medicare Important Message Given:    Date Medicare IM Given:    Medicare IM give by:    Date Additional Medicare IM Given:    Additional Medicare Important Message give by:     If discussed at [place] of Stay Meetings, dates discussed:    Discharge Disposition: Home/self care   Additional Comments:  Dawayne Patricia, RN 07/06/2015, 9:56 AM

## 2015-07-07 LAB — CULTURE, BLOOD (ROUTINE X 2)
CULTURE: NO GROWTH
Culture: NO GROWTH

## 2015-07-09 ENCOUNTER — Other Ambulatory Visit: Payer: Self-pay | Admitting: Physician Assistant

## 2015-07-09 DIAGNOSIS — I426 Alcoholic cardiomyopathy: Secondary | ICD-10-CM

## 2015-07-09 DIAGNOSIS — I429 Cardiomyopathy, unspecified: Secondary | ICD-10-CM

## 2015-07-31 ENCOUNTER — Ambulatory Visit: Payer: Self-pay | Admitting: Family Medicine

## 2015-09-02 ENCOUNTER — Other Ambulatory Visit (HOSPITAL_COMMUNITY): Payer: Self-pay

## 2015-09-03 ENCOUNTER — Ambulatory Visit: Payer: Self-pay | Admitting: Family Medicine

## 2015-09-17 ENCOUNTER — Ambulatory Visit (HOSPITAL_COMMUNITY): Payer: Self-pay | Attending: Cardiology

## 2015-09-17 ENCOUNTER — Other Ambulatory Visit: Payer: Self-pay

## 2015-09-17 ENCOUNTER — Other Ambulatory Visit: Payer: Self-pay | Admitting: Physician Assistant

## 2015-09-17 DIAGNOSIS — I252 Old myocardial infarction: Secondary | ICD-10-CM | POA: Insufficient documentation

## 2015-09-17 DIAGNOSIS — I429 Cardiomyopathy, unspecified: Secondary | ICD-10-CM

## 2015-09-17 DIAGNOSIS — I119 Hypertensive heart disease without heart failure: Secondary | ICD-10-CM | POA: Insufficient documentation

## 2015-09-17 DIAGNOSIS — E785 Hyperlipidemia, unspecified: Secondary | ICD-10-CM | POA: Insufficient documentation

## 2015-09-17 DIAGNOSIS — J449 Chronic obstructive pulmonary disease, unspecified: Secondary | ICD-10-CM | POA: Insufficient documentation

## 2015-09-17 DIAGNOSIS — Z720 Tobacco use: Secondary | ICD-10-CM | POA: Insufficient documentation

## 2015-09-17 DIAGNOSIS — I2510 Atherosclerotic heart disease of native coronary artery without angina pectoris: Secondary | ICD-10-CM | POA: Insufficient documentation

## 2015-09-21 ENCOUNTER — Ambulatory Visit: Payer: Self-pay | Admitting: Family Medicine

## 2015-09-25 ENCOUNTER — Ambulatory Visit: Payer: Self-pay | Admitting: Family Medicine

## 2015-09-29 ENCOUNTER — Encounter: Payer: Self-pay | Admitting: *Deleted

## 2015-10-06 ENCOUNTER — Ambulatory Visit (INDEPENDENT_AMBULATORY_CARE_PROVIDER_SITE_OTHER): Payer: Self-pay | Admitting: Family Medicine

## 2015-10-06 ENCOUNTER — Encounter: Payer: Self-pay | Admitting: Family Medicine

## 2015-10-06 VITALS — BP 146/80 | HR 99 | Temp 98.5°F | Resp 16 | Ht 72.0 in | Wt 190.0 lb

## 2015-10-06 DIAGNOSIS — G629 Polyneuropathy, unspecified: Secondary | ICD-10-CM

## 2015-10-06 DIAGNOSIS — J449 Chronic obstructive pulmonary disease, unspecified: Secondary | ICD-10-CM

## 2015-10-06 DIAGNOSIS — Z23 Encounter for immunization: Secondary | ICD-10-CM

## 2015-10-06 DIAGNOSIS — E7800 Pure hypercholesterolemia, unspecified: Secondary | ICD-10-CM

## 2015-10-06 DIAGNOSIS — I10 Essential (primary) hypertension: Secondary | ICD-10-CM

## 2015-10-06 DIAGNOSIS — F1410 Cocaine abuse, uncomplicated: Secondary | ICD-10-CM

## 2015-10-06 LAB — POCT URINALYSIS DIP (DEVICE)
GLUCOSE, UA: NEGATIVE mg/dL
Hgb urine dipstick: NEGATIVE
KETONES UR: NEGATIVE mg/dL
Leukocytes, UA: NEGATIVE
Nitrite: NEGATIVE
PH: 6 (ref 5.0–8.0)
PROTEIN: 30 mg/dL — AB
Urobilinogen, UA: 1 mg/dL (ref 0.0–1.0)

## 2015-10-06 MED ORDER — ASPIRIN 325 MG PO TBEC: 325.0000 mg | DELAYED_RELEASE_TABLET | Freq: Every day | ORAL | Status: DC

## 2015-10-06 MED ORDER — MOMETASONE FURO-FORMOTEROL FUM 100-5 MCG/ACT IN AERO: 2.0000 | INHALATION_SPRAY | Freq: Two times a day (BID) | RESPIRATORY_TRACT | Status: DC

## 2015-10-06 MED ORDER — HYDROCHLOROTHIAZIDE 12.5 MG PO TABS: 12.5000 mg | ORAL_TABLET | Freq: Every day | ORAL | Status: DC

## 2015-10-06 MED ORDER — ATORVASTATIN CALCIUM 80 MG PO TABS: 80.0000 mg | ORAL_TABLET | Freq: Every day | ORAL | Status: DC

## 2015-10-06 MED ORDER — ALBUTEROL SULFATE HFA 108 (90 BASE) MCG/ACT IN AERS: 2.0000 | INHALATION_SPRAY | Freq: Four times a day (QID) | RESPIRATORY_TRACT | Status: DC | PRN

## 2015-10-06 MED FILL — !VENTOLIN HFA INHALER: 108 (90 BAS | 30 days supply | Qty: 18 | Fill #0

## 2015-10-06 MED FILL — ?HYDROCHLOROTHIAZIDE 12.5MG: 12.5 | 30 days supply | Qty: 30 | Fill #0

## 2015-10-06 MED FILL — ATORVASTATIN 80 MG TABLET: 80 | 30 days supply | Qty: 30 | Fill #0

## 2015-10-06 NOTE — Patient Instructions (Addendum)
[facility] [address] [place], [place] Stimulant Use Disorder-Cocaine Cocaine is one of a group of powerful drugs called stimulants. Cocaine has medical uses for stopping nosebleeds and for pain control before minor nose or dental surgery. However, cocaine is misused because of the effects that it produces. These effects include:   A feeling of extreme pleasure.  Alertness.  High energy. Common street names for cocaine include coke, crack, blow, snow, and nose candy. Cocaine is snorted, dissolved in water and injected, or smoked.  Stimulants are addictive because they activate regions of the brain that produce both the pleasurable sensation of "reward" and psychological dependence. Together, these actions account for loss of control and the rapid development of drug dependence. This means you become ill without the drug (withdrawal) and need to keep using it to function.  Stimulant use disorder is use of stimulants that disrupts your daily life. It disrupts relationships with family and friends and how you do your job. Cocaine increases your blood pressure and heart rate. It can cause a heart attack or stroke. Cocaine can also cause death from irregular heart rate or seizures. SYMPTOMS Symptoms of stimulant use disorder with cocaine include:  Use of cocaine in larger amounts or over a longer period of time than intended.  Unsuccessful attempts to cut down or control cocaine use.  A lot of time spent obtaining, using, or recovering from the effects of cocaine.  A strong desire or urge to use cocaine (craving).  Continued use of cocaine in spite of major problems at work, school, or home because of use.  Continued use of cocaine in spite of relationship problems because of use.  Giving up or cutting down on important life activities because of cocaine use.  Use of cocaine over and over in situations when it is physically hazardous, such  as driving a car.  Continued use of cocaine in spite of a physical problem that is likely related to use. Physical problems can include:  Malnutrition.  Nosebleeds.  Chest pain.  High blood pressure.  A hole that develops between the part of your nose that separates your nostrils (perforated nasal septum).  Lung and kidney damage.  Continued use of cocaine in spite of a mental problem that is likely related to use. Mental problems can include:  Schizophrenia-like symptoms.  Depression.  Bipolar mood swings.  Anxiety.  Sleep problems.  Need to use more and more cocaine to get the same effect, or lessened effect over time with use of the same amount of cocaine (tolerance).  Having withdrawal symptoms when cocaine use is stopped, or using cocaine to reduce or avoid withdrawal symptoms. Withdrawal symptoms include:  Depressed or irritable mood.  Low energy or restlessness.  Bad dreams.  Poor or excessive sleep.  Increased appetite. DIAGNOSIS Stimulant use disorder is diagnosed by your health care provider. You may be asked questions about your cocaine use and how it affects your life. A physical exam may be done. A drug screen may be ordered. You may be referred to a mental health professional. The diagnosis of stimulant use disorder requires at least two symptoms within 12 months. The type of stimulant use disorder depends on the number of signs and symptoms you have. The type may be:  Mild. Two or three signs and symptoms.  Moderate. Four or five signs and symptoms.  Severe. Six or more signs and symptoms. TREATMENT Treatment for stimulant use disorder is usually provided by  mental health professionals with training in substance use disorders. The following options are available:  Counseling or talk therapy. Talk therapy addresses the reasons you use cocaine and ways to keep you from using again. Goals of talk therapy include:  Identifying and avoiding triggers for  use.  Handling cravings.  Replacing use with healthy activities.  Support groups. Support groups provide emotional support, advice, and guidance.  Medicine. Certain medicines may decrease cocaine cravings or withdrawal symptoms. HOME CARE INSTRUCTIONS  Take medicines only as directed by your health care provider.  Identify the people and activities that trigger your cocaine use and avoid them.  Keep all follow-up visits as directed by your health care provider. SEEK MEDICAL CARE IF:  Your symptoms get worse or you relapse.  You are not able to take medicines as directed. SEEK IMMEDIATE MEDICAL CARE IF:  You have serious thoughts about hurting yourself or others.  You have a seizure, chest pain, sudden weakness, or loss of speech or vision. [place] on Drug Abuse: motorcyclefax.com  Substance Abuse and Mental Health Services Administration: ktimeonline.com   This information is not intended to replace advice given to you by your health care provider. Make sure you discuss any questions you have with your health care provider.   Document Released: 05/27/2000 Document Revised: 06/20/2014 Document Reviewed: 06/12/2013 Elsevier Interactive Patient Education 2016 Elsevier Inc. Tobacco Use Disorder Tobacco use disorder (TUD) is a mental disorder. It is the long-term use of tobacco in spite of related health problems or difficulty with normal life activities. Tobacco is most commonly smoked as cigarettes and less commonly as cigars or pipes. Smokeless chewing tobacco and snuff are also popular. People with TUD get a feeling of extreme pleasure (euphoria) from using tobacco and have a desire to use it again and again. Repeated use of tobacco can cause problems. The addictive effects of tobacco are due mainly tothe ingredient nicotine. Nicotine also causes a rush of adrenaline (epinephrine) in the body. This leads to increased blood pressure, heart rate,  and breathing rate. These changes may cause problems for people with high blood pressure, weak hearts, or lung disease. High doses of nicotine in children and pets can lead to seizures and death.  Tobacco contains a number of other unsafe chemicals. These chemicals are especially harmful when inhaled as smoke and can damage almost every organ in the body. Smokers live shorter lives than nonsmokers and are at risk of dying from a number of diseases and cancers. Tobacco smoke can also cause health problems for nonsmokers (due to inhaling secondhand smoke). Smoking is also a fire hazard.  TUD usually starts in the late teenage years and is most common in young adults between the ages of 80 and 9 years. People who start smoking earlier in life are more likely to continue smoking as adults. TUD is somewhat more common in men than women. People with TUD are at higher risk for using alcohol and other drugs of abuse. RISK FACTORS Risk factors for TUD include:   Having family members with the disorder.  Being around people who use tobacco.  Having an existing mental health issue such as schizophrenia, depression, bipolar disorder, ADHD, or posttraumatic stress disorder (PTSD). SIGNS AND SYMPTOMS  People with tobacco use disorder have two or more of the following signs and symptoms within 12 months:   Use of more tobacco over a longer period than intended.   Not able to cut down or control tobacco use.  A lot of time spent obtaining or using tobacco.   Strong desire or urge to use tobacco (craving). Cravings may last for 6 months or longer after quitting.  Use of tobacco even when use leads to major problems at work, school, or home.   Use of tobacco even when use leads to relationship problems.   Giving up or cutting down on important life activities because of tobacco use.   Repeatedly using tobacco in situations where it puts you or others in physical danger, like smoking in bed.    Use of tobacco even when it is known that a physical or mental problem is likely related to tobacco use.   Physical problems are numerous and may include chronic bronchitis, emphysema, lung and other cancers, gum disease, high blood pressure, heart disease, and stroke.   Mental problems caused by tobacco may include difficulty sleeping and anxiety.  Need to use greater amounts of tobacco to get the same effect. This means you have developed a tolerance.   Withdrawal symptoms as a result of stopping or rapidly cutting back use. These symptoms may last a month or more after quitting and include the following:   Depressed, anxious, or irritable mood.   Difficulty concentrating.   Increased appetite.  Restlessness or trouble sleeping.   Use of tobacco to avoid withdrawal symptoms. DIAGNOSIS  Tobacco use disorder is diagnosed by your health care provider. A diagnosis may be made by:  Your health care provider asking questions about your tobacco use and any problems it may be causing.  A physical exam.  Lab tests.  You may be referred to a mental health professional or addiction specialist. The severity of tobacco use disorder depends on the number of signs and symptoms you have:   Mild--Two or three symptoms.  Moderate--Four or five symptoms.   Severe--Six or more symptoms.  TREATMENT  Many people with tobacco use disorder are unable to quit on their own and need help. Treatment options include the following:  Nicotine replacement therapy (NRT). NRT provides nicotine without the other harmful chemicals in tobacco. NRT gradually lowers the dosage of nicotine in the body and reduces withdrawal symptoms. NRT is available in over-the-counter forms (gum, lozenges, and skin patches) as well as prescription forms (mouth inhaler and nasal spray).  Medicines.This may include:  Antidepressant medicine that may reduce nicotine cravings.  A medicine that acts on nicotine  receptors in the brain to reduce cravings and withdrawal symptoms. It may also block the effects of tobacco in people with TUD who relapse.  Counseling or talk therapy. A form of talk therapy called behavioral therapy is commonly used to treat people with TUD. Behavioral therapy looks at triggers for tobacco use, how to avoid them, and how to cope with cravings. It is most effective in person or by phone but is also available in self-help forms (books and Internet websites).  Support groups. These provide emotional support, advice, and guidance for quitting tobacco. The most effective treatment for TUD is usually a combination of medicine, talk therapy, and support groups. HOME CARE INSTRUCTIONS  Keep all follow-up visits as directed by your health care provider. This is important.  Take medicines only as directed by your health care provider.  Check with your health care provider before starting new prescription or over-the-counter medicines. SEEK MEDICAL CARE IF:  You are not able to take your medicines as prescribed.  Treatment is not helping your TUD and your symptoms get worse. [place]  IF:  You have serious thoughts about hurting yourself or others.  You have trouble breathing, chest pain, sudden weakness, or sudden numbness in part of your body.   This information is not intended to replace advice given to you by your health care provider. Make sure you discuss any questions you have with your health care provider.   Document Released: 02/03/2004 Document Revised: 06/20/2014 Document Reviewed: 07/26/2013 Elsevier Interactive Patient Education [employer].

## 2015-10-06 NOTE — Progress Notes (Signed)
Subjective:    Patient ID: [name], male    DOB: [birth year], 60 y.o.   MRN: [id number]  HPI Ms. [name], a 60 year old male with a history of hypertension, cocaine abuse,  and COPD presents to establish care. He was a patient of [facility] [employer]. Patient was lost to follow up due to insurance constraints.   Patient has a history of hypertension. He says that he has been out of medication for greater than 1 month. He is not exercising and does not follow a low sodium diet.  He is not exercising and is not adherent to low salt diet.  He does not check blood pressures at home.. Patient denies chest pain, chest pressure/discomfort, dyspnea, fatigue, lower extremity edema, orthopnea, palpitations, syncope and tachypnea.  Cardiovascular risk factors include: sedentary lifestyle and smoking/ tobacco exposure.  Past Medical History  Diagnosis Date  . COPD (chronic obstructive pulmonary disease) ([place])   . Perforation of duodenal ulcer ([place])     a. 1970s - s/p surgery.   . H/O: GI bleed     a. 60 y/o ago, r/t ulcer. Denies recent GIB.  . H/O: eczema     lower extremities  . High cholesterol   . Hypertension   . Cocaine abuse   . Tobacco abuse   . CAD (coronary artery disease)     4V CABG 10/31/13  . Myocardial infarction ([place])   . GERD (gastroesophageal reflux disease)   . Anginal pain ([place])   . Shortness of breath dyspnea    Immunization History  Administered Date(s) Administered  . Influenza,inj,Quad PF,36+ Mos 09/02/2014, 07/06/2015  . Pneumococcal Polysaccharide-23 10/27/2013   Review of Systems  Constitutional: Negative.   HENT: Negative.   Eyes: Negative.   Respiratory: Negative.  Negative for apnea, cough, chest tightness and shortness of breath.   Cardiovascular: Negative.  Negative for chest pain, palpitations and leg swelling.  Gastrointestinal: Negative.   Endocrine: Negative.  Negative for polydipsia, polyphagia and polyuria.   Genitourinary: Negative for dysuria, hematuria, discharge, penile swelling and penile pain.  Musculoskeletal: Negative.   Skin: Negative.   Allergic/Immunologic: Negative.  Negative for immunocompromised state.  Neurological: Positive for numbness (primarily to right lower extremity). Negative for dizziness and headaches.  Hematological: Negative.   Psychiatric/Behavioral: Negative.        Objective:   Physical Exam  Constitutional: He is oriented to person, place, and time.  HENT:  Head: Normocephalic and atraumatic.  Right Ear: External ear normal.  Mouth/Throat: Oropharynx is clear and moist.  Eyes: Conjunctivae and EOM are normal. Pupils are equal, round, and reactive to light.  Neck: Normal range of motion. Neck supple.  Cardiovascular: Normal rate, regular rhythm, normal heart sounds and intact distal pulses.   Pulmonary/Chest: Effort normal and breath sounds normal. No accessory muscle usage. No respiratory distress. He has no decreased breath sounds. He has no wheezes. He has no rhonchi. He has no rales.  Abdominal: Soft. Bowel sounds are normal.  Musculoskeletal: Normal range of motion.  Neurological: He is alert and oriented to person, place, and time. He has normal reflexes.  Skin: Skin is warm and dry. Rash (right lower extremity) noted. Nails show no clubbing.  Hyperpigmentation to right lower extremity  Psychiatric: His behavior is normal. Thought content normal. His mood appears not anxious. His affect is not angry and not blunt. His speech is not rapid and/or pressured. He does not exhibit a depressed mood.  BP 146/80 mmHg  Pulse 99  Temp(Src) 98.5 F (36.9 C) (Oral)  Resp 16  Ht 6' (1.829 m)  Wt 190 lb (86.183 kg)  BMI 25.76 kg/m2  SpO2 100% Assessment & Plan:  1. Essential hypertension Blood pressure is above goal on current medication regimen. Will start hydrochlorothiazide 12.5 daily and will follow up in 1 month.  - hydrochlorothiazide  (HYDRODIURIL) 12.5 MG tablet; Take 1 tablet (12.5 mg total) by mouth daily.  Dispense: 30 tablet; Refill: 2 - COMPLETE METABOLIC PANEL WITH GFR - Hemoglobin A1c - CBC with Differential - POCT urinalysis dip (device)  2. Chronic obstructive pulmonary disease, unspecified COPD type ([place]) Will restart Dulera twice daily for history of COPD. Will also send in rescue inhaler. Mr. Hagenow and I discussed the importance of smoking cessation in relation to COPD at length.  - albuterol (PROVENTIL HFA;VENTOLIN HFA) 108 (90 Base) MCG/ACT inhaler; Inhale 2 puffs into the lungs every 6 (six) hours as needed for wheezing or shortness of breath.  Dispense: 1 Inhaler; Refill: 0 - mometasone-formoterol (DULERA) 100-5 MCG/ACT AERO; Inhale 2 puffs into the lungs 2 (two) times daily.  Dispense: 1 Inhaler; Refill: 0  3. Pure hypercholesterolemia - atorvastatin (LIPITOR) 80 MG tablet; Take 1 tablet (80 mg total) by mouth daily at 6 PM.  Dispense: 30 tablet; Refill: 2 - aspirin 325 MG EC tablet; Take 1 tablet (325 mg total) by mouth daily.  Dispense: 30 tablet; Refill: 2  4. Neuropathy (HCC) - Hemoglobin A1c - HIV antibody (with reflex)  5. Cocaine abuse Mr. Virgia Land and I spent a great deal of time discussing history of cocaine use. He occasionally snort cocaine or uses crack cocaine. He says that he can quit on his own. Discussed the level of difficulty involved in quitting on his own. Recommend that [facility] into [facility]. Patient was given the information and expressed understanding.  - Drug Screen, Urine   RTC: 1 month for hypertension The patient was given clear instructions to go to ER or return to medical center if symptoms do not improve, worsen or new problems develop. The patient verbalized understanding. Will notify patient with laboratory results.  Dorena Dew, FNP

## 2015-10-07 LAB — DRUG SCREEN, URINE
AMPHETAMINE SCRN UR: NEGATIVE
Barbiturate Quant, Ur: NEGATIVE
Benzodiazepines.: NEGATIVE
COCAINE METABOLITES: POSITIVE — AB
Creatinine,U: 400.05 mg/dL
MARIJUANA METABOLITE: NEGATIVE
Methadone: NEGATIVE
OPIATES: NEGATIVE
PHENCYCLIDINE (PCP): NEGATIVE
Propoxyphene: NEGATIVE

## 2015-10-07 LAB — COMPLETE METABOLIC PANEL WITH GFR
ALBUMIN: 4 g/dL (ref 3.6–5.1)
ALT: 13 U/L (ref 9–46)
AST: 18 U/L (ref 10–35)
Alkaline Phosphatase: 103 U/L (ref 40–115)
BILIRUBIN TOTAL: 0.5 mg/dL (ref 0.2–1.2)
BUN: 12 mg/dL (ref 7–25)
CALCIUM: 9.1 mg/dL (ref 8.6–10.3)
CO2: 26 mmol/L (ref 20–31)
CREATININE: 1.15 mg/dL (ref 0.70–1.25)
Chloride: 101 mmol/L (ref 98–110)
GFR, EST AFRICAN AMERICAN: 79 mL/min (ref >=60)
GFR, EST NON AFRICAN AMERICAN: 68 mL/min (ref >=60)
Glucose, Bld: 71 mg/dL (ref 65–99)
Potassium: 4.3 mmol/L (ref 3.5–5.3)
Sodium: 141 mmol/L (ref 135–146)
TOTAL PROTEIN: 6.9 g/dL (ref 6.1–8.1)

## 2015-10-07 LAB — CBC WITH DIFFERENTIAL/PLATELET
BASOS ABS: 57 {cells}/uL (ref 0–200)
Basophils Relative: 1 %
EOS ABS: 684 {cells}/uL — AB (ref 15–500)
Eosinophils Relative: 12 %
HEMATOCRIT: 44.4 % (ref 38.5–50.0)
HEMOGLOBIN: 14.5 g/dL (ref 13.2–17.1)
LYMPHS ABS: 2109 {cells}/uL (ref 850–3900)
Lymphocytes Relative: 37 %
MCH: 29.1 pg (ref 27.0–33.0)
MCHC: 32.7 g/dL (ref 32.0–36.0)
MCV: 89.2 fL (ref 80.0–100.0)
MPV: 10.4 fL (ref 7.5–12.5)
Monocytes Absolute: 570 cells/uL (ref 200–950)
Monocytes Relative: 10 %
NEUTROS ABS: 2280 {cells}/uL (ref 1500–7800)
NEUTROS PCT: 40 %
Platelets: 317 10*3/uL (ref 140–400)
RBC: 4.98 MIL/uL (ref 4.20–5.80)
RDW: 15.5 % — ABNORMAL HIGH (ref 11.0–15.0)
WBC: 5.7 10*3/uL (ref 3.8–10.8)

## 2015-10-07 LAB — HEMOGLOBIN A1C
Hgb A1c MFr Bld: 5.6 % (ref <5.7)
Mean Plasma Glucose: 114 mg/dL

## 2015-10-07 LAB — HIV ANTIBODY (ROUTINE TESTING W REFLEX): HIV: NONREACTIVE

## 2015-10-07 MED FILL — DULERA 100 MCG/5 MCG INH: 100-5 | 30 days supply | Qty: 13 | Fill #0

## 2015-10-09 ENCOUNTER — Other Ambulatory Visit: Payer: Self-pay

## 2015-10-12 ENCOUNTER — Other Ambulatory Visit: Payer: Self-pay

## 2015-10-12 DIAGNOSIS — I10 Essential (primary) hypertension: Secondary | ICD-10-CM

## 2015-10-12 DIAGNOSIS — E7800 Pure hypercholesterolemia, unspecified: Secondary | ICD-10-CM

## 2015-10-12 DIAGNOSIS — J449 Chronic obstructive pulmonary disease, unspecified: Secondary | ICD-10-CM

## 2015-10-12 MED ORDER — ALBUTEROL SULFATE HFA 108 (90 BASE) MCG/ACT IN AERS: 2.0000 | INHALATION_SPRAY | Freq: Four times a day (QID) | RESPIRATORY_TRACT | Status: DC | PRN

## 2015-10-12 MED ORDER — MOMETASONE FURO-FORMOTEROL FUM 100-5 MCG/ACT IN AERO: 2.0000 | INHALATION_SPRAY | Freq: Two times a day (BID) | RESPIRATORY_TRACT | Status: DC

## 2015-10-12 MED ORDER — FLUTICASONE PROPIONATE 50 MCG/ACT NA SUSP: 2.0000 | Freq: Every day | NASAL | Status: DC

## 2015-10-12 MED ORDER — ASPIRIN 325 MG PO TBEC: 325.0000 mg | DELAYED_RELEASE_TABLET | Freq: Every day | ORAL | Status: DC

## 2015-10-12 MED ORDER — ATORVASTATIN CALCIUM 80 MG PO TABS: 80.0000 mg | ORAL_TABLET | Freq: Every day | ORAL | Status: DC

## 2015-10-12 MED ORDER — HYDROCHLOROTHIAZIDE 12.5 MG PO TABS: 12.5000 mg | ORAL_TABLET | Freq: Every day | ORAL | Status: DC

## 2015-10-12 MED FILL — FLUTICASONE PROP 50 MCG SPR: 50 | 30 days supply | Qty: 16 | Fill #0

## 2015-10-12 NOTE — Telephone Encounter (Signed)
Medications sent into pharmacy. Thanks.

## 2015-10-13 ENCOUNTER — Other Ambulatory Visit: Payer: Self-pay

## 2015-11-19 ENCOUNTER — Encounter: Payer: Self-pay | Admitting: Family Medicine

## 2015-11-19 ENCOUNTER — Ambulatory Visit (INDEPENDENT_AMBULATORY_CARE_PROVIDER_SITE_OTHER): Payer: Self-pay | Admitting: Family Medicine

## 2015-11-19 VITALS — BP 128/81 | HR 85 | Temp 97.8°F | Resp 18 | Ht 73.0 in | Wt 194.0 lb

## 2015-11-19 DIAGNOSIS — F17200 Nicotine dependence, unspecified, uncomplicated: Secondary | ICD-10-CM

## 2015-11-19 DIAGNOSIS — J449 Chronic obstructive pulmonary disease, unspecified: Secondary | ICD-10-CM

## 2015-11-19 DIAGNOSIS — E7800 Pure hypercholesterolemia, unspecified: Secondary | ICD-10-CM

## 2015-11-19 DIAGNOSIS — I10 Essential (primary) hypertension: Secondary | ICD-10-CM

## 2015-11-19 LAB — LIPID PANEL
CHOLESTEROL: 164 mg/dL (ref 125–200)
HDL: 77 mg/dL (ref >=40)
LDL Cholesterol: 63 mg/dL (ref <130)
TRIGLYCERIDES: 121 mg/dL (ref <150)
Total CHOL/HDL Ratio: 2.1 Ratio (ref <=5.0)
VLDL: 24 mg/dL (ref <30)

## 2015-11-19 LAB — POCT URINALYSIS DIP (DEVICE)
BILIRUBIN URINE: NEGATIVE
Glucose, UA: NEGATIVE mg/dL
HGB URINE DIPSTICK: NEGATIVE
Leukocytes, UA: NEGATIVE
NITRITE: NEGATIVE
PH: 7 (ref 5.0–8.0)
Protein, ur: 30 mg/dL — AB
SPECIFIC GRAVITY, URINE: 1.015 (ref 1.005–1.030)
Urobilinogen, UA: 0.2 mg/dL (ref 0.0–1.0)

## 2015-11-19 MED ORDER — ATORVASTATIN CALCIUM 80 MG PO TABS: 80.0000 mg | ORAL_TABLET | Freq: Every day | ORAL | Status: DC

## 2015-11-19 MED ORDER — HYDROCHLOROTHIAZIDE 12.5 MG PO TABS: 12.5000 mg | ORAL_TABLET | Freq: Every day | ORAL | Status: DC

## 2015-11-19 MED ORDER — ASPIRIN 325 MG PO TBEC: 325.0000 mg | DELAYED_RELEASE_TABLET | Freq: Every day | ORAL | Status: DC

## 2015-11-19 MED ORDER — ALBUTEROL SULFATE HFA 108 (90 BASE) MCG/ACT IN AERS: 2.0000 | INHALATION_SPRAY | Freq: Four times a day (QID) | RESPIRATORY_TRACT | Status: DC | PRN

## 2015-11-19 MED ORDER — MOMETASONE FURO-FORMOTEROL FUM 100-5 MCG/ACT IN AERO: 2.0000 | INHALATION_SPRAY | Freq: Two times a day (BID) | RESPIRATORY_TRACT | Status: DC

## 2015-11-19 MED FILL — HYDROCHLOROTHIAZIDE 12.5 MG: 12.5 | 30 days supply | Qty: 30 | Fill #0

## 2015-11-19 MED FILL — VENTOLIN HFA 90 MCG INHALER: 108 (90 BAS | 28 days supply | Qty: 18 | Fill #0

## 2015-11-19 MED FILL — ATORVASTATIN 80 MG TABLET: 80 | 30 days supply | Qty: 30 | Fill #0

## 2015-11-19 NOTE — Patient Instructions (Signed)
DASH Eating Plan  DASH stands for "Dietary Approaches to Stop Hypertension." The DASH eating plan is a healthy eating plan that has been shown to reduce high blood pressure (hypertension). Additional health benefits may include reducing the risk of type 2 diabetes mellitus, heart disease, and stroke. The DASH eating plan may also help with weight loss.  WHAT DO I NEED TO KNOW ABOUT THE DASH EATING PLAN?  For the DASH eating plan, you will follow these general guidelines:  · Choose foods with a percent daily value for sodium of less than 5% (as listed on the food label).  · Use salt-free seasonings or herbs instead of table salt or sea salt.  · Check with your health care provider or pharmacist before using salt substitutes.  · Eat lower-sodium products, often labeled as "lower sodium" or "no salt added."  · Eat fresh foods.  · Eat more vegetables, fruits, and low-fat dairy products.  · Choose whole grains. Look for the word "whole" as the first word in the ingredient list.  · Choose fish and skinless chicken or turkey more often than red meat. Limit fish, poultry, and meat to 6 oz (170 g) each day.  · Limit sweets, desserts, sugars, and sugary drinks.  · Choose heart-healthy fats.  · Limit cheese to 1 oz (28 g) per day.  · Eat more home-cooked food and less restaurant, buffet, and fast food.  · Limit fried foods.  · Cook foods using methods other than frying.  · Limit canned vegetables. If you do use them, rinse them well to decrease the sodium.  · When eating at a restaurant, ask that your food be prepared with less salt, or no salt if possible.  WHAT FOODS CAN I EAT?  Seek help from a dietitian for individual calorie needs.  Grains  Whole grain or whole wheat bread. Brown rice. Whole grain or whole wheat pasta. Quinoa, bulgur, and whole grain cereals. Low-sodium cereals. Corn or whole wheat flour tortillas. Whole grain cornbread. Whole grain crackers. Low-sodium crackers.  Vegetables  Fresh or frozen vegetables  (raw, steamed, roasted, or grilled). Low-sodium or reduced-sodium tomato and vegetable juices. Low-sodium or reduced-sodium tomato sauce and paste. Low-sodium or reduced-sodium canned vegetables.   Fruits  All fresh, canned (in natural juice), or frozen fruits.  Meat and Other Protein Products  Ground beef (85% or leaner), grass-fed beef, or beef trimmed of fat. Skinless chicken or turkey. Ground chicken or turkey. Pork trimmed of fat. All fish and seafood. Eggs. Dried beans, peas, or lentils. Unsalted nuts and seeds. Unsalted canned beans.  Dairy  Low-fat dairy products, such as skim or 1% milk, 2% or reduced-fat cheeses, low-fat ricotta or cottage cheese, or plain low-fat yogurt. Low-sodium or reduced-sodium cheeses.  Fats and Oils  Tub margarines without trans fats. Light or reduced-fat mayonnaise and salad dressings (reduced sodium). Avocado. Safflower, olive, or canola oils. Natural peanut or almond butter.  Other  Unsalted popcorn and pretzels.  The items listed above may not be a complete list of recommended foods or beverages. Contact your dietitian for more options.  WHAT FOODS ARE NOT RECOMMENDED?  Grains  White bread. White pasta. White rice. Refined cornbread. Bagels and croissants. Crackers that contain trans fat.  Vegetables  Creamed or fried vegetables. Vegetables in a cheese sauce. Regular canned vegetables. Regular canned tomato sauce and paste. Regular tomato and vegetable juices.  Fruits  Dried fruits. Canned fruit in light or heavy syrup. Fruit juice.  Meat and Other Protein   Products  Fatty cuts of meat. Ribs, chicken wings, bacon, sausage, bologna, salami, chitterlings, fatback, hot dogs, bratwurst, and packaged luncheon meats. Salted nuts and seeds. Canned beans with salt.  Dairy  Whole or 2% milk, cream, half-and-half, and cream cheese. Whole-fat or sweetened yogurt. Full-fat cheeses or blue cheese. Nondairy creamers and whipped toppings. Processed cheese, cheese spreads, or cheese  curds.  Condiments  Onion and garlic salt, seasoned salt, table salt, and sea salt. Canned and packaged gravies. Worcestershire sauce. Tartar sauce. Barbecue sauce. Teriyaki sauce. Soy sauce, including reduced sodium. Steak sauce. Fish sauce. Oyster sauce. Cocktail sauce. Horseradish. Ketchup and mustard. Meat flavorings and tenderizers. Bouillon cubes. Hot sauce. Tabasco sauce. Marinades. Taco seasonings. Relishes.  Fats and Oils  Butter, stick margarine, lard, shortening, ghee, and bacon fat. Coconut, palm kernel, or palm oils. Regular salad dressings.  Other  Pickles and olives. Salted popcorn and pretzels.  The items listed above may not be a complete list of foods and beverages to avoid. Contact your dietitian for more information.  WHERE CAN I FIND MORE INFORMATION?  National Heart, Lung, and Blood Institute: www.nhlbi.nih.gov/health/health-topics/topics/dash/     This information is not intended to replace advice given to you by your health care provider. Make sure you discuss any questions you have with your health care provider.     Document Released: 05/19/2011 Document Revised: 06/20/2014 Document Reviewed: 04/03/2013  Elsevier Interactive Patient Education ©2016 Elsevier Inc.

## 2015-11-19 NOTE — Progress Notes (Signed)
Subjective:    Patient ID: [name], male    DOB: 1956/06/10, 60 y.o.   MRN: [id number]  HPI [name], a 60 year old male with a history of hypertension, cocaine abuse,  and COPD presents for a 1 month follow up for hypertension. Patient has been taking hydrochlorothiazide consistently, exercising, and following a low fat, low sodium diet.  He does not check blood pressures at home.. Patient denies chest pain, chest pressure/discomfort, dyspnea, fatigue, lower extremity edema, orthopnea, palpitations, syncope and tachypnea.  Cardiovascular risk factors include: sedentary lifestyle and smoking/ tobacco exposure.  Past Medical History  Diagnosis Date  . COPD (chronic obstructive pulmonary disease) ([place])   . Perforation of duodenal ulcer ([place])     a. 1970s - s/p surgery.   . H/O: GI bleed     a. 60 y/o ago, r/t ulcer. Denies recent GIB.  . H/O: eczema     lower extremities  . High cholesterol   . Hypertension   . Cocaine abuse   . Tobacco abuse   . CAD (coronary artery disease)     4V CABG 10/31/13  . Myocardial infarction ([place])   . GERD (gastroesophageal reflux disease)   . Anginal pain ([place])   . Shortness of breath dyspnea    Immunization History  Administered Date(s) Administered  . Influenza,inj,Quad PF,36+ Mos 09/02/2014, 07/06/2015  . Pneumococcal Polysaccharide-23 10/27/2013   Review of Systems  Constitutional: Negative.   HENT: Negative.   Eyes: Negative.  Negative for photophobia.  Respiratory: Negative.  Negative for apnea, cough, chest tightness and shortness of breath.   Cardiovascular: Negative.  Negative for chest pain, palpitations and leg swelling.  Gastrointestinal: Negative.   Endocrine: Negative.  Negative for polydipsia, polyphagia and polyuria.  Genitourinary: Negative for dysuria, hematuria, discharge, penile swelling and penile pain.  Musculoskeletal: Negative.   Skin: Negative.   Allergic/Immunologic: Negative.  Negative for  immunocompromised state.  Neurological: Positive for numbness (primarily to right lower extremity). Negative for dizziness and headaches.  Hematological: Negative.   Psychiatric/Behavioral: Negative.        Objective:   Physical Exam  Constitutional: He is oriented to person, place, and time.  HENT:  Head: Normocephalic and atraumatic.  Right Ear: External ear normal.  Mouth/Throat: Oropharynx is clear and moist.  Eyes: Conjunctivae and EOM are normal. Pupils are equal, round, and reactive to light.  Neck: Normal range of motion. Neck supple.  Cardiovascular: Normal rate, regular rhythm, normal heart sounds and intact distal pulses.   Pulmonary/Chest: Effort normal and breath sounds normal. No accessory muscle usage. No respiratory distress. He has no decreased breath sounds. He has no wheezes. He has no rhonchi. He has no rales.  Abdominal: Soft. Bowel sounds are normal.  Musculoskeletal: Normal range of motion.  Neurological: He is alert and oriented to person, place, and time. He has normal reflexes.  Skin: Skin is warm and dry. Nails show no clubbing.  Hyperpigmentation to right lower extremity  Psychiatric: His behavior is normal. Thought content normal. His mood appears not anxious. His affect is not angry and not blunt. His speech is not rapid and/or pressured. He does not exhibit a depressed mood.       BP 128/81 mmHg  Pulse 85  Temp(Src) 97.8 F (36.6 C) (Oral)  Resp 18  Ht 6\' 1"  (1.854 m)  Wt 194 lb (87.998 kg)  BMI 25.60 kg/m2  SpO2 100% Assessment & Plan:   1. Essential hypertension Blood pressure is at  goal on current medication regimen. Will continue meds at current dosage.  - hydrochlorothiazide (HYDRODIURIL) 12.5 MG tablet; Take 1 tablet (12.5 mg total) by mouth daily.  Dispense: 30 tablet; Refill: 5 - Urinalysis Dipstick  2. Pure hypercholesterolemia The patient is asked to make an attempt to improve diet and exercise patterns to aid in medical management  of this problem. - atorvastatin (LIPITOR) 80 MG tablet; Take 1 tablet (80 mg total) by mouth daily at 6 PM.  Dispense: 30 tablet; Refill: 5 - aspirin 325 MG EC tablet; Take 1 tablet (325 mg total) by mouth daily.  Dispense: 30 tablet; Refill: 5 - Lipid Panel  3. Chronic obstructive pulmonary disease, unspecified COPD type ([place]) Will continue Dulera twice daily for history of COPD.  [name] and I discussed the importance of smoking cessation in relation to COPD at length.  - albuterol (PROVENTIL HFA;VENTOLIN HFA) 108 (90 Base) MCG/ACT inhaler; Inhale 2 puffs into the lungs every 6 (six) hours as needed for wheezing or shortness of breath.  Dispense: 1 Inhaler; Refill: 3 - mometasone-formoterol (DULERA) 100-5 MCG/ACT AERO; Inhale 2 puffs into the lungs 2 (two) times daily.  Dispense: 1 Inhaler; Refill: 5  4. Tobacco dependence Smoking cessation instruction/counseling given:  counseled patient on the dangers of tobacco use, advised patient to stop smoking, and reviewed strategies to maximize success     RTC: 3 months for hypertension   The patient was given clear instructions to go to ER or return to medical center if symptoms do not improve, worsen or new problems develop. The patient verbalized understanding. Will notify patient with laboratory results.  Dorena Dew, FNP

## 2015-12-22 MED FILL — ATORVASTATIN 80 MG TABLET: 80 | 30 days supply | Qty: 30 | Fill #1

## 2015-12-22 MED FILL — VENTOLIN HFA 90 MCG INHALER: 108 (90 BAS | 28 days supply | Qty: 18 | Fill #1

## 2015-12-22 MED FILL — HYDROCHLOROTHIAZIDE 12.5 MG: 12.5 | 30 days supply | Qty: 30 | Fill #1

## 2016-02-02 MED FILL — VENTOLIN HFA 90 MCG INHALER: 108 (90 BAS | 28 days supply | Qty: 18 | Fill #2

## 2016-02-02 MED FILL — ATORVASTATIN 80 MG TABLET: 80 | 30 days supply | Qty: 30 | Fill #2

## 2016-02-02 MED FILL — HYDROCHLOROTHIAZIDE 12.5 MG: 12.5 | 30 days supply | Qty: 30 | Fill #2

## 2016-02-22 ENCOUNTER — Ambulatory Visit: Payer: Self-pay | Admitting: Family Medicine

## 2016-03-17 MED FILL — ATORVASTATIN 80 MG TABLET: 80 | 30 days supply | Qty: 30 | Fill #3

## 2016-03-17 MED FILL — VENTOLIN HFA 90 MCG INHALER: 108 (90 BAS | 28 days supply | Qty: 18 | Fill #3

## 2016-03-17 MED FILL — HYDROCHLOROTHIAZIDE 12.5 MG: 12.5 | 30 days supply | Qty: 30 | Fill #3

## 2016-05-03 MED FILL — ATORVASTATIN 80 MG TABLET: 80 | 30 days supply | Qty: 30 | Fill #4

## 2016-05-03 MED FILL — VENTOLIN HFA 90 MCG INHALER: 108 (90 BAS | 30 days supply | Qty: 18 | Fill #0

## 2016-05-03 MED FILL — ?HYDROCHLOROTHIAZIDE 12.5 M: 12.5 | 30 days supply | Qty: 30 | Fill #4

## 2016-05-30 ENCOUNTER — Ambulatory Visit (INDEPENDENT_AMBULATORY_CARE_PROVIDER_SITE_OTHER): Payer: Self-pay | Admitting: Family Medicine

## 2016-05-30 ENCOUNTER — Encounter: Payer: Self-pay | Admitting: Family Medicine

## 2016-05-30 VITALS — BP 146/67 | HR 83 | Temp 98.0°F | Resp 18 | Ht 73.0 in | Wt 194.0 lb

## 2016-05-30 DIAGNOSIS — R062 Wheezing: Secondary | ICD-10-CM

## 2016-05-30 DIAGNOSIS — L309 Dermatitis, unspecified: Secondary | ICD-10-CM

## 2016-05-30 DIAGNOSIS — I10 Essential (primary) hypertension: Secondary | ICD-10-CM

## 2016-05-30 DIAGNOSIS — F17200 Nicotine dependence, unspecified, uncomplicated: Secondary | ICD-10-CM | POA: Insufficient documentation

## 2016-05-30 DIAGNOSIS — J449 Chronic obstructive pulmonary disease, unspecified: Secondary | ICD-10-CM

## 2016-05-30 DIAGNOSIS — E7800 Pure hypercholesterolemia, unspecified: Secondary | ICD-10-CM

## 2016-05-30 DIAGNOSIS — Z23 Encounter for immunization: Secondary | ICD-10-CM

## 2016-05-30 LAB — COMPLETE METABOLIC PANEL WITH GFR
ALBUMIN: 4.2 g/dL (ref 3.6–5.1)
ALK PHOS: 111 U/L (ref 40–115)
ALT: 19 U/L (ref 9–46)
AST: 21 U/L (ref 10–35)
BILIRUBIN TOTAL: 0.4 mg/dL (ref 0.2–1.2)
BUN: 7 mg/dL (ref 7–25)
CALCIUM: 9.5 mg/dL (ref 8.6–10.3)
CO2: 26 mmol/L (ref 20–31)
CREATININE: 1.1 mg/dL (ref 0.70–1.25)
Chloride: 103 mmol/L (ref 98–110)
GFR, Est African American: 83 mL/min (ref >=60)
GFR, Est Non African American: 72 mL/min (ref >=60)
Glucose, Bld: 82 mg/dL (ref 65–99)
POTASSIUM: 4.1 mmol/L (ref 3.5–5.3)
Sodium: 139 mmol/L (ref 135–146)
Total Protein: 6.9 g/dL (ref 6.1–8.1)

## 2016-05-30 LAB — CBC WITH DIFFERENTIAL/PLATELET
BASOS ABS: 69 {cells}/uL (ref 0–200)
BASOS PCT: 1 %
EOS ABS: 1380 {cells}/uL — AB (ref 15–500)
Eosinophils Relative: 20 %
HCT: 41.4 % (ref 38.5–50.0)
HEMOGLOBIN: 13.5 g/dL (ref 13.2–17.1)
LYMPHS ABS: 2208 {cells}/uL (ref 850–3900)
Lymphocytes Relative: 32 %
MCH: 29 pg (ref 27.0–33.0)
MCHC: 32.6 g/dL (ref 32.0–36.0)
MCV: 88.8 fL (ref 80.0–100.0)
MPV: 10.5 fL (ref 7.5–12.5)
Monocytes Absolute: 621 cells/uL (ref 200–950)
Monocytes Relative: 9 %
Neutro Abs: 2622 cells/uL (ref 1500–7800)
Neutrophils Relative %: 38 %
PLATELETS: 300 10*3/uL (ref 140–400)
RBC: 4.66 MIL/uL (ref 4.20–5.80)
RDW: 15.4 % — ABNORMAL HIGH (ref 11.0–15.0)
WBC: 6.9 10*3/uL (ref 3.8–10.8)

## 2016-05-30 LAB — LIPID PANEL
CHOLESTEROL: 158 mg/dL (ref <200)
HDL: 81 mg/dL (ref >40)
LDL Cholesterol: 60 mg/dL (ref <100)
Total CHOL/HDL Ratio: 2 Ratio (ref <5.0)
Triglycerides: 83 mg/dL (ref <150)
VLDL: 17 mg/dL (ref <30)

## 2016-05-30 LAB — POCT URINALYSIS DIP (DEVICE)
BILIRUBIN URINE: NEGATIVE
GLUCOSE, UA: NEGATIVE mg/dL
Hgb urine dipstick: NEGATIVE
KETONES UR: NEGATIVE mg/dL
Nitrite: NEGATIVE
PROTEIN: NEGATIVE mg/dL
SPECIFIC GRAVITY, URINE: 1.01 (ref 1.005–1.030)
Urobilinogen, UA: 0.2 mg/dL (ref 0.0–1.0)
pH: 6 (ref 5.0–8.0)

## 2016-05-30 MED ORDER — AZITHROMYCIN 250 MG PO TABS: ORAL_TABLET | ORAL | 0 refills | Status: DC

## 2016-05-30 MED ORDER — TRIAMCINOLONE ACETONIDE 0.025 % EX OINT
1.0000 | TOPICAL_OINTMENT | Freq: Two times a day (BID) | CUTANEOUS | 2 refills | Status: DC
Start: 2016-05-30 — End: 2016-10-19

## 2016-05-30 MED ORDER — HYDROCHLOROTHIAZIDE 12.5 MG PO TABS: 12.5000 mg | ORAL_TABLET | Freq: Every day | ORAL | 1 refills | Status: DC

## 2016-05-30 MED ORDER — ALBUTEROL SULFATE HFA 108 (90 BASE) MCG/ACT IN AERS: 2.0000 | INHALATION_SPRAY | Freq: Four times a day (QID) | RESPIRATORY_TRACT | 1 refills | Status: DC | PRN

## 2016-05-30 MED ORDER — VARENICLINE TARTRATE 1 MG PO TABS: 1.0000 mg | ORAL_TABLET | Freq: Every day | ORAL | 1 refills | Status: DC

## 2016-05-30 MED ORDER — BUDESONIDE-FORMOTEROL FUMARATE 160-4.5 MCG/ACT IN AERO: 2.0000 | INHALATION_SPRAY | Freq: Two times a day (BID) | RESPIRATORY_TRACT | 3 refills | Status: DC

## 2016-05-30 MED ORDER — ATORVASTATIN CALCIUM 80 MG PO TABS: 80.0000 mg | ORAL_TABLET | Freq: Every day | ORAL | 1 refills | Status: DC

## 2016-05-30 MED FILL — ?HYDROCHLOROTHIAZIDE 12.5MG: 12.5 | 30 days supply | Qty: 30 | Fill #0

## 2016-05-30 MED FILL — **DULERA 100 MCG/5 MCG INH: 100-5 | 15 days supply | Qty: 13 | Fill #0

## 2016-05-30 MED FILL — !VENTOLIN HFA INHALER: 108 (90 BAS | 25 days supply | Qty: 18 | Fill #0

## 2016-05-30 MED FILL — TRIAMCINOLONE 0.025% OINT: 0.025 | 15 days supply | Qty: 30 | Fill #0

## 2016-05-30 MED FILL — ?AZITHROMYCIN 250 MG TABLET: 250 | 5 days supply | Qty: 6 | Fill #0

## 2016-05-30 MED FILL — ATORVASTATIN 80 MG TABLET: 80 | 30 days supply | Qty: 30 | Fill #0

## 2016-05-30 NOTE — Progress Notes (Signed)
Subjective:    Patient ID: [name], male    DOB: 27-Nov-1955, 60 y.o.   MRN: [id number] [name], a 60 year old male with a history of hypertension, cocaine abuse,  and COPD presents for a 6 month follow up for chronic conditions.Patient has been taking hydrochlorothiazide consistently, exercising, and following a low fat, low sodium diet.  He does not check blood pressures at home.. Patient denies chest pain, chest pressure/discomfort, dyspnea, fatigue, lower extremity edema, orthopnea, palpitations, syncope and tachypnea.  Cardiovascular risk factors include: sedentary lifestyle and smoking/ tobacco exposure.  Hypertension  This is a chronic problem. The current episode started more than 1 year ago. The problem has been gradually improving since onset. Pertinent negatives include no anxiety, blurred vision, chest pain, headaches, malaise/fatigue, neck pain, orthopnea, palpitations, peripheral edema, PND, shortness of breath or sweats. There are no associated agents to hypertension. Risk factors for coronary artery disease include dyslipidemia and smoking/tobacco exposure. Past treatments include diuretics. The current treatment provides moderate improvement. There are no compliance problems.  There is no history of angina, kidney disease, CAD/MI, CVA, heart failure, left ventricular hypertrophy, PVD or retinopathy.  Wheezing   This is a chronic (Mr. Engelberg has a history of COPD and tobaco dependence) problem. The current episode started in the past 7 days. The problem occurs daily. The problem has been gradually improving. Associated symptoms include coughing and sputum production. Pertinent negatives include no abdominal pain, chest pain, chills, diarrhea, fever, headaches, hemoptysis, neck pain, rash, rhinorrhea, shortness of breath or sore throat. He has tried beta agonist inhalers for the symptoms. The treatment provided mild relief. His past medical history is significant for COPD.  There is no history of heart failure.  Nicotine Dependence  Presents for initial visit. Symptoms are negative for sore throat. Preferred tobacco types include cigarettes. Preferred cigarette types include filtered. Preferred strength is regular. Preferred cigarettes are non-menthol. Preferred brands include Newport. His urge triggers include company of smokers, drinking alcohol and drinking coffee. The symptoms have been worsening. His first smoke is before 6 AM. He smokes < 1/2 a pack of cigarettes per day. Past treatments include nothing. Compliance with prior treatments has been poor. [name] is ready to quit. [name] has tried to quit 1 time. His past medical history is significant for drug use. There is no history of alcohol abuse.    Past Medical History:  Diagnosis Date  . Anginal pain ([place])   . CAD (coronary artery disease)    4V CABG 10/31/13  . Cocaine abuse   . COPD (chronic obstructive pulmonary disease) ([employer])   . GERD (gastroesophageal reflux disease)   . H/O: eczema    lower extremities  . H/O: GI bleed    a. 60 y/o ago, r/t ulcer. Denies recent GIB.  [place] High cholesterol   . Hypertension   . Myocardial infarction   . Perforation of duodenal ulcer ([place])    a. 1970s - s/p surgery.   . Shortness of breath dyspnea   . Tobacco abuse    Immunization History  Administered Date(s) Administered  . Influenza,inj,Quad PF,36+ Mos 09/02/2014, 07/06/2015  . Pneumococcal Polysaccharide-23 10/27/2013   Review of Systems  Constitutional: Negative.  Negative for chills, fever and malaise/fatigue.  HENT: Negative.  Negative for rhinorrhea and sore throat.   Eyes: Negative.  Negative for blurred vision and photophobia.  Respiratory: Positive for cough, sputum production and wheezing. Negative for apnea, hemoptysis, chest tightness and shortness of breath.  Cardiovascular: Negative.  Negative for chest pain, palpitations, orthopnea, leg swelling and PND.  Gastrointestinal: Negative.   Negative for abdominal pain and diarrhea.  Endocrine: Negative.  Negative for polydipsia, polyphagia and polyuria.  Genitourinary: Negative for discharge, dysuria, hematuria, penile pain and penile swelling.  Musculoskeletal: Negative.  Negative for neck pain.  Skin: Negative.  Negative for rash.  Allergic/Immunologic: Negative.  Negative for immunocompromised state.  Neurological: Positive for numbness (primarily to right lower extremity). Negative for dizziness and headaches.  Hematological: Negative.   Psychiatric/Behavioral: Negative.        Objective:   Physical Exam  Constitutional: He is oriented to person, place, and time.  HENT:  Head: Normocephalic and atraumatic.  Right Ear: External ear normal.  Mouth/Throat: Oropharynx is clear and moist.  Eyes: Conjunctivae and EOM are normal. Pupils are equal, round, and reactive to light.  Neck: Normal range of motion. Neck supple.  Cardiovascular: Normal rate, regular rhythm, normal heart sounds and intact distal pulses.   Pulmonary/Chest: Effort normal and breath sounds normal. No accessory muscle usage. No respiratory distress. He has no decreased breath sounds. He has no wheezes. He has no rhonchi. He has no rales.  Abdominal: Soft. Bowel sounds are normal.  Musculoskeletal: Normal range of motion.  Neurological: He is alert and oriented to person, place, and time. He has normal reflexes.  Skin: Skin is warm and dry. Nails show no clubbing.  Hyperpigmentation to right lower extremity  Psychiatric: His behavior is normal. Thought content normal. His mood appears not anxious. His affect is not angry and not blunt. His speech is not rapid and/or pressured. He does not exhibit a depressed mood.       BP (!) 146/67 (BP Location: Left Arm, Patient Position: Sitting, Cuff Size: Normal)   Pulse 83   Temp 98 F (36.7 C) (Oral)   Resp 18   Ht 6\' 1"  (1.854 m)   Wt 194 lb (88 kg)   SpO2 99%   BMI 25.60 kg/m  Assessment & Plan:   1.  Essential hypertension Blood pressure is above goal; patient did not take BP medication this am. Patient to return in 1 week following a blood pressure check.  - hydrochlorothiazide (HYDRODIURIL) 12.5 MG tablet; Take 1 tablet (12.5 mg total) by mouth daily.  Dispense: 90 tablet; Refill: 1 - COMPLETE METABOLIC PANEL WITH GFR - CBC with Differential  2. Pure hypercholesterolemia - Lipid Panel - atorvastatin (LIPITOR) 80 MG tablet; Take 1 tablet (80 mg total) by mouth daily at 6 PM.  Dispense: 90 tablet; Refill: 1  3. Chronic obstructive pulmonary disease, unspecified COPD type ([facility]) - albuterol (PROVENTIL HFA;VENTOLIN HFA) 108 (90 Base) MCG/ACT inhaler; Inhale 2 puffs into the lungs every 6 (six) hours as needed for wheezing or shortness of breath.  Dispense: 1 Inhaler; Refill: 1 - budesonide-formoterol (SYMBICORT) 160-4.5 MCG/ACT inhaler; Inhale 2 puffs into the lungs 2 (two) times daily.  Dispense: 1 Inhaler; Refill: 3 - azithromycin (ZITHROMAX) 250 MG tablet; Take 500 mg today; Days 2-5 250 mg  Dispense: 6 each; Refill: 0  4. Wheezing - albuterol (PROVENTIL HFA;VENTOLIN HFA) 108 (90 Base) MCG/ACT inhaler; Inhale 2 puffs into the lungs every 6 (six) hours as needed for wheezing or shortness of breath.  Dispense: 1 Inhaler; Refill: 1 - budesonide-formoterol (SYMBICORT) 160-4.5 MCG/ACT inhaler; Inhale 2 puffs into the lungs 2 (two) times daily.  Dispense: 1 Inhaler; Refill: 3   5. Need for immunization against influenza - Flu Vaccine QUAD 36+ mos IM (  Fluarix)  6. Tobacco dependence Smoking cessation discussed at length. Will start Chantix on June 13, 2016 - varenicline (CHANTIX) 1 MG tablet; Take 1 tablet (1 mg total) by mouth daily.  Dispense: 30 tablet; Refill: 1  7. Eczema of upper extremity - triamcinolone (KENALOG) 0.025 % ointment; Apply 1 application topically 2 (two) times daily.  Dispense: 30 g; Refill: 2     RTC: 6 months for hypertension and hyperlipidemia The patient was  given clear instructions to go to ER or return to medical center if symptoms do not improve, worsen or new problems develop. The patient verbalized understanding. Will notify patient with laboratory results.  Dorena Dew, FNP

## 2016-07-04 MED FILL — SYMBICORT 160-4.5 MCG INH: 160-4.5 | 30 days supply | Qty: 10 | Fill #0

## 2016-07-04 MED FILL — !CHANTIX 1 MG TABLET: 1 | 30 days supply | Qty: 30 | Fill #0

## 2016-07-04 MED FILL — ?HYDROCHLOROTHIAZIDE 12.5MG: 12.5 | 30 days supply | Qty: 30 | Fill #1

## 2016-07-04 MED FILL — ATORVASTATIN 80 MG TABLET: 80 | 30 days supply | Qty: 30 | Fill #1

## 2016-07-04 MED FILL — VENTOLIN HFA 90 MCG INHALER: 108 (90 BAS | 25 days supply | Qty: 18 | Fill #1

## 2016-08-04 MED FILL — TRIAMCINOLONE 0.025% OINT: 0.025 | 15 days supply | Qty: 30 | Fill #1

## 2016-08-04 MED FILL — !CHANTIX 1 MG TABLET: 1 | 30 days supply | Qty: 30 | Fill #1

## 2016-08-04 MED FILL — ?HYDROCHLOROTHIAZIDE 12.5MG: 12.5 | 30 days supply | Qty: 30 | Fill #2

## 2016-08-04 MED FILL — VENTOLIN HFA 90 MCG INHALER: 108 (90 BAS | 25 days supply | Qty: 18 | Fill #1

## 2016-08-04 MED FILL — ATORVASTATIN 80 MG TABLET: 80 | 30 days supply | Qty: 30 | Fill #2

## 2016-09-16 MED FILL — ATORVASTATIN 80 MG TABLET: 80 | 30 days supply | Qty: 30 | Fill #5

## 2016-09-16 MED FILL — !VENTOLIN HFA INHALER: 108 (90 BAS | 25 days supply | Qty: 18 | Fill #2

## 2016-09-16 MED FILL — ?HYDROCHLOROTHIAZIDE 12.5MG: 12.5 | 30 days supply | Qty: 30 | Fill #3

## 2016-09-16 MED FILL — TRIAMCINOLONE 0.025% OINT: 0.025 | 15 days supply | Qty: 30 | Fill #2

## 2016-10-19 ENCOUNTER — Other Ambulatory Visit: Payer: Self-pay | Admitting: Family Medicine

## 2016-10-19 DIAGNOSIS — L309 Dermatitis, unspecified: Secondary | ICD-10-CM

## 2016-10-19 DIAGNOSIS — J449 Chronic obstructive pulmonary disease, unspecified: Secondary | ICD-10-CM

## 2016-10-19 MED FILL — ATORVASTATIN 80 MG TABLET: 80 | 30 days supply | Qty: 30 | Fill #3

## 2016-10-19 MED FILL — ?HYDROCHLOROTHIAZIDE 12.5MG: 12.5 | 30 days supply | Qty: 30 | Fill #4

## 2016-10-20 MED FILL — TRIAMCINOLONE 0.025% OINT: 0.025 | 15 days supply | Qty: 30 | Fill #0

## 2016-10-21 MED FILL — !VENTOLIN HFA INHALER: 108 (90 BAS | 25 days supply | Qty: 18 | Fill #0

## 2016-11-11 MED FILL — !VENTOLIN HFA INHALER: 108 (90 BAS | 25 days supply | Qty: 18 | Fill #1

## 2016-11-11 MED FILL — ATORVASTATIN 80 MG TABLET: 80 | 30 days supply | Qty: 30 | Fill #4

## 2016-11-11 MED FILL — ?HYDROCHLOROTHIAZIDE 12.5MG: 12.5 | 30 days supply | Qty: 30 | Fill #5

## 2016-11-28 ENCOUNTER — Other Ambulatory Visit: Payer: Self-pay | Admitting: Family Medicine

## 2016-11-28 ENCOUNTER — Encounter: Payer: Self-pay | Admitting: Family Medicine

## 2016-11-28 ENCOUNTER — Ambulatory Visit (INDEPENDENT_AMBULATORY_CARE_PROVIDER_SITE_OTHER): Payer: Self-pay | Admitting: Family Medicine

## 2016-11-28 VITALS — BP 110/67 | HR 79 | Temp 98.4°F | Resp 16 | Ht 73.0 in | Wt 194.0 lb

## 2016-11-28 DIAGNOSIS — J449 Chronic obstructive pulmonary disease, unspecified: Secondary | ICD-10-CM

## 2016-11-28 DIAGNOSIS — R7989 Other specified abnormal findings of blood chemistry: Secondary | ICD-10-CM

## 2016-11-28 DIAGNOSIS — R062 Wheezing: Secondary | ICD-10-CM

## 2016-11-28 DIAGNOSIS — F17200 Nicotine dependence, unspecified, uncomplicated: Secondary | ICD-10-CM

## 2016-11-28 DIAGNOSIS — I10 Essential (primary) hypertension: Secondary | ICD-10-CM

## 2016-11-28 LAB — BASIC METABOLIC PANEL WITH GFR
BUN: 9 mg/dL (ref 7–25)
CALCIUM: 9 mg/dL (ref 8.6–10.3)
CO2: 27 mmol/L (ref 20–31)
Chloride: 104 mmol/L (ref 98–110)
Creat: 1.27 mg/dL — ABNORMAL HIGH (ref 0.70–1.25)
GFR, EST AFRICAN AMERICAN: 69 mL/min (ref >=60)
GFR, EST NON AFRICAN AMERICAN: 60 mL/min (ref >=60)
GLUCOSE: 88 mg/dL (ref 65–99)
Potassium: 4.1 mmol/L (ref 3.5–5.3)
Sodium: 140 mmol/L (ref 135–146)

## 2016-11-28 LAB — POCT URINALYSIS DIP (DEVICE)
Bilirubin Urine: NEGATIVE
GLUCOSE, UA: NEGATIVE mg/dL
Hgb urine dipstick: NEGATIVE
KETONES UR: NEGATIVE mg/dL
Leukocytes, UA: NEGATIVE
Nitrite: NEGATIVE
PH: 5.5 (ref 5.0–8.0)
PROTEIN: NEGATIVE mg/dL
SPECIFIC GRAVITY, URINE: 1.02 (ref 1.005–1.030)
UROBILINOGEN UA: 0.2 mg/dL (ref 0.0–1.0)

## 2016-11-28 MED ORDER — NICOTINE 14 MG/24HR TD PT24: 14.0000 mg | MEDICATED_PATCH | Freq: Every day | TRANSDERMAL | 0 refills | Status: DC

## 2016-11-28 MED ORDER — BUDESONIDE-FORMOTEROL FUMARATE 160-4.5 MCG/ACT IN AERO: 2.0000 | INHALATION_SPRAY | Freq: Two times a day (BID) | RESPIRATORY_TRACT | 3 refills | Status: DC

## 2016-11-28 NOTE — Patient Instructions (Addendum)
Coping with Quitting Smoking Quitting smoking is a physical and mental challenge. You will face cravings, withdrawal symptoms, and temptation. Before quitting, work with your health care provider to make a plan that can help you cope. Preparation can help you quit and keep you from giving in. How can I cope with cravings? Cravings usually last for 5-10 minutes. If you get through it, the craving will pass. Consider taking the following actions to help you cope with cravings:  Keep your mouth busy: ? Chew sugar-free gum. ? Suck on hard candies or a straw. ? Brush your teeth.  Keep your hands and body busy: ? Immediately change to a different activity when you feel a craving. ? Squeeze or play with a ball. ? Do an activity or a hobby, like making bead jewelry, practicing needlepoint, or working with wood. ? Mix up your normal routine. ? Take a short exercise break. Go for a quick walk or run up and down stairs. ? Spend time in public places where smoking is not allowed.  Focus on doing something kind or helpful for someone else.  Call a friend or family member to talk during a craving.  Join a support group.  Call a quit line, such as 1-800-QUIT-NOW.  Talk with your health care provider about medicines that might help you cope with cravings and make quitting easier for you.  How can I deal with withdrawal symptoms? Your body may experience negative effects as it tries to get used to not having nicotine in the system. These effects are called withdrawal symptoms. They may include:  Feeling hungrier than normal.  Trouble concentrating.  Irritability.  Trouble sleeping.  Feeling depressed.  Restlessness and agitation.  Craving a cigarette.  To manage withdrawal symptoms:  Avoid places, people, and activities that trigger your cravings.  Remember why you want to quit.  Get plenty of sleep.  Avoid coffee and other caffeinated drinks. These may worsen some of your  symptoms.  How can I handle social situations? Social situations can be difficult when you are quitting smoking, especially in the first few weeks. To manage this, you can:  Avoid parties, bars, and other social situations where people might be smoking.  Avoid alcohol.  Leave right away if you have the urge to smoke.  Explain to your family and friends that you are quitting smoking. Ask for understanding and support.  Plan activities with friends or family where smoking is not an option.  What are some ways I can cope with stress? Wanting to smoke may cause stress, and stress can make you want to smoke. Find ways to manage your stress. Relaxation techniques can help. For example:  Breathe slowly and deeply, in through your nose and out through your mouth.  Listen to soothing, relaxing music.  Talk with a family member or friend about your stress.  Light a candle.  Soak in a bath or take a shower.  Think about a peaceful place.  What are some ways I can prevent weight gain? Be aware that many people gain weight after they quit smoking. However, not everyone does. To keep from gaining weight, have a plan in place before you quit and stick to the plan after you quit. Your plan should include:  Having healthy snacks. When you have a craving, it may help to: ? Eat plain popcorn, crunchy carrots, celery, or other cut vegetables. ? Chew sugar-free gum.  Changing how you eat: ? Eat small portion sizes at meals. ?   Eat 4-6 small meals throughout the day instead of 1-2 large meals a day. ? Be mindful when you eat. Do not watch television or do other things that might distract you as you eat.  Exercising regularly: ? Make time to exercise each day. If you do not have time for a long workout, do short bouts of exercise for 5-10 minutes several times a day. ? Do some form of strengthening exercise, like weight lifting, and some form of aerobic exercise, like running or  swimming.  Drinking plenty of water or other low-calorie or no-calorie drinks. Drink 6-8 glasses of water daily, or as much as instructed by your health care provider.  Summary  Quitting smoking is a physical and mental challenge. You will face cravings, withdrawal symptoms, and temptation to smoke again. Preparation can help you as you go through these challenges.  You can cope with cravings by keeping your mouth busy (such as by chewing gum), keeping your body and hands busy, and making calls to family, friends, or a helpline for people who want to quit smoking.  You can cope with withdrawal symptoms by avoiding places where people smoke, avoiding drinks with caffeine, and getting plenty of rest.  Ask your health care provider about the different ways to prevent weight gain, avoid stress, and handle social situations. This information is not intended to replace advice given to you by your health care provider. Make sure you discuss any questions you have with your health care provider. Document Released: 05/27/2016 Document Revised: 05/27/2016 Document Reviewed: 05/27/2016 [employer] Interactive Patient Education  2018 [employer]  

## 2016-11-28 NOTE — Progress Notes (Signed)
Subjective:    Patient ID: [name], male    DOB: 1955/08/28, 61 y.o.   MRN: [id number] Ms. [name], a 61 year old male with a history of hypertension and COPD presents for a 6 month follow up. Patient has been taking hydrochlorothiazide consistently, exercising, and following a low fat, low sodium diet.  He does not check blood pressures at home. Patient denies chest pain, chest pressure/discomfort, dyspnea, fatigue, lower extremity edema, orthopnea, palpitations, syncope and tachypnea.  Cardiovascular risk factors include: sedentary lifestyle and smoking/ tobacco exposure. He is trying to quit smoking. He has been wearing Nicotine patches and has reduces cigarette smoking to 3-4 cigarettes per day. He has a 40 year smoking history.  Hypertension  This is a chronic problem. The current episode started more than 1 year ago. The problem has been gradually improving since onset. Pertinent negatives include no anxiety, blurred vision, chest pain, headaches, malaise/fatigue, neck pain, orthopnea, palpitations, peripheral edema, PND, shortness of breath or sweats. There are no associated agents to hypertension. Risk factors for coronary artery disease include dyslipidemia and smoking/tobacco exposure. Past treatments include diuretics. The current treatment provides moderate improvement. There are no compliance problems.  There is no history of angina, kidney disease, CAD/MI, CVA, heart failure, left ventricular hypertrophy, PVD or retinopathy.  Nicotine Dependence  Presents for initial visit. Symptoms are negative for sore throat. Preferred tobacco types include cigarettes. Preferred cigarette types include filtered. Preferred strength is regular. Preferred cigarettes are non-menthol. Preferred brands include Newport. His urge triggers include company of smokers, drinking alcohol and drinking coffee. The symptoms have been worsening. His first smoke is before 6 AM. He smokes < 1/2 a pack of  cigarettes per day. Past treatments include nicotine patch. The treatment provided moderate relief. Compliance with prior treatments has been variable. Maxine is thinking about quitting. [place] has tried to quit 1 time. There is no history of alcohol abuse. Drug use: history of cocaine use.    Past Medical History:  Diagnosis Date  . Anginal pain ([place])   . CAD (coronary artery disease)    4V CABG 10/31/13  . Cocaine abuse   . COPD (chronic obstructive pulmonary disease) ([place])   . GERD (gastroesophageal reflux disease)   . H/O: eczema    lower extremities  . H/O: GI bleed    a. 61 y/o ago, r/t ulcer. Denies recent GIB.  Marland Kitchen High cholesterol   . Hypertension   . Myocardial infarction ([place])   . Perforation of duodenal ulcer ([place])    a. 1970s - s/p surgery.   . Shortness of breath dyspnea   . Tobacco abuse    Immunization History  Administered Date(s) Administered  . Influenza,inj,Quad PF,36+ Mos 09/02/2014, 07/06/2015, 05/30/2016  . Pneumococcal Polysaccharide-23 10/27/2013   Review of Systems  Constitutional: Negative.  Negative for chills, fever and malaise/fatigue.  HENT: Negative.  Negative for rhinorrhea and sore throat.   Eyes: Negative.  Negative for blurred vision and photophobia.  Respiratory: Positive for sputum production and wheezing. Negative for apnea, hemoptysis, chest tightness and shortness of breath.   Cardiovascular: Negative.  Negative for chest pain, palpitations, orthopnea, leg swelling and PND.  Gastrointestinal: Negative.  Negative for abdominal pain and diarrhea.  Endocrine: Negative.  Negative for polydipsia, polyphagia and polyuria.  Genitourinary: Negative for discharge, dysuria, hematuria, penile pain and penile swelling.  Musculoskeletal: Negative.  Negative for neck pain.  Skin: Negative.  Negative for rash.  Allergic/Immunologic: Negative.  Negative for immunocompromised state.  Neurological: Positive for numbness. Negative for dizziness and headaches.   Hematological: Negative.   Psychiatric/Behavioral: Negative.        Objective:   Physical Exam  Constitutional: He is oriented to person, place, and time.  HENT:  Head: Normocephalic and atraumatic.  Right Ear: External ear normal.  Mouth/Throat: Oropharynx is clear and moist.  Eyes: Conjunctivae and EOM are normal. Pupils are equal, round, and reactive to light.  Neck: Normal range of motion. Neck supple.  Cardiovascular: Normal rate, regular rhythm, normal heart sounds and intact distal pulses.   Pulmonary/Chest: Effort normal and breath sounds normal. No accessory muscle usage. No respiratory distress. He has no decreased breath sounds. He has no wheezes. He has no rhonchi. He has no rales.  Abdominal: Soft. Bowel sounds are normal.  Musculoskeletal: Normal range of motion.  Neurological: He is alert and oriented to person, place, and time. He has normal reflexes.  Skin: Skin is warm and dry. Nails show no clubbing.  Psychiatric: His behavior is normal. Thought content normal. His mood appears not anxious. His affect is not angry and not blunt. His speech is not rapid and/or pressured. He does not exhibit a depressed mood.       BP 110/67 (BP Location: Left Arm, Patient Position: Sitting, Cuff Size: Normal)   Pulse 79   Temp 98.4 F (36.9 C) (Oral)   Resp 16   Ht 6\' 1"  (1.854 m)   Wt 194 lb (88 kg)   SpO2 100%   BMI 25.60 kg/m  Assessment & Plan:  1. Essential hypertension Blood pressure is at goal on current medication regimen. Will continue hydrochlorothiazide 12.5. Also, recommend a low fat, low sodium diet.  - BASIC METABOLIC PANEL WITH GFR  2. Chronic obstructive pulmonary disease, unspecified COPD type (HCC) - budesonide-formoterol (SYMBICORT) 160-4.5 MCG/ACT inhaler; Inhale 2 puffs into the lungs 2 (two) times daily.  Dispense: 1 Inhaler; Refill: 3  3. Wheezing - budesonide-formoterol (SYMBICORT) 160-4.5 MCG/ACT inhaler; Inhale 2 puffs into the lungs 2 (two)  times daily.  Dispense: 1 Inhaler; Refill: 3  4. Tobacco dependence - nicotine (NICODERM CQ) 14 mg/24hr patch; Place 1 patch (14 mg total) onto the skin daily.  Dispense: 30 patch; Refill: 0   RTC: 6 months for chronic conditions and prostate exam   Donia Pounds  MSN, FNP-C [facility] [address] [place], [place] [address] [phone]

## 2016-11-29 ENCOUNTER — Telehealth: Payer: Self-pay

## 2016-11-29 NOTE — Telephone Encounter (Signed)
-----   Message from Dorena Dew, [place] sent at 11/28/2016 10:40 PM EDT ----- Regarding: lab results Reviewed labs, creatinine is mildly elevated at 1.27 which is consistent with impaired kidney function. Remind [name] to take antihypertension medications consistently and follow a low sodium, low fat diet, and decrease smoking. Schedule lab appointment for 3 months  Thanks.  ----- Message ----- From: Interface, [facility] Sent: 11/28/2016   9:17 PM To: Dorena Dew, FNP

## 2016-11-29 NOTE — Telephone Encounter (Signed)
I have called and spoken with patient advised of elevated kidney function. Advised to take bloodpressre medications consistently and to eat low fat/low sodium diet, to decrease smoking, and we have scheduled an appointment for 3 months to follow up on this. Patient verbalized understanding and had no questions at this time. Thanks!

## 2016-12-12 MED FILL — !SYMBICORT 160-4.5 MCG INH: 160-4.5 | 15 days supply | Qty: 1 | Fill #1

## 2016-12-12 MED FILL — !VENTOLIN HFA INHALER: 108 (90 BAS | 25 days supply | Qty: 18 | Fill #2

## 2016-12-12 MED FILL — ATORVASTATIN 80 MG TABLET: 80 | 30 days supply | Qty: 30 | Fill #5

## 2017-01-02 ENCOUNTER — Ambulatory Visit: Payer: Self-pay | Attending: Family Medicine

## 2017-01-02 MED FILL — SYMBICORT 160-4.5 MCG INH: 160-4.5 | 30 days supply | Qty: 10 | Fill #2

## 2017-01-13 ENCOUNTER — Other Ambulatory Visit: Payer: Self-pay | Admitting: Family Medicine

## 2017-01-13 DIAGNOSIS — J449 Chronic obstructive pulmonary disease, unspecified: Secondary | ICD-10-CM

## 2017-01-13 DIAGNOSIS — I10 Essential (primary) hypertension: Secondary | ICD-10-CM

## 2017-01-27 ENCOUNTER — Other Ambulatory Visit: Payer: Self-pay | Admitting: *Deleted

## 2017-01-27 DIAGNOSIS — R062 Wheezing: Secondary | ICD-10-CM

## 2017-01-27 DIAGNOSIS — J449 Chronic obstructive pulmonary disease, unspecified: Secondary | ICD-10-CM

## 2017-01-27 MED ORDER — FLUTICASONE FUROATE-VILANTEROL 200-25 MCG/INH IN AEPB: 1.0000 | INHALATION_SPRAY | Freq: Every day | RESPIRATORY_TRACT | 3 refills | Status: DC

## 2017-01-27 MED ORDER — ALBUTEROL SULFATE HFA 108 (90 BASE) MCG/ACT IN AERS: 2.0000 | INHALATION_SPRAY | Freq: Four times a day (QID) | RESPIRATORY_TRACT | 3 refills | Status: DC | PRN

## 2017-01-27 NOTE — Telephone Encounter (Signed)
PRINTED FOR PASS PROGRAM 

## 2017-01-31 ENCOUNTER — Other Ambulatory Visit: Payer: Self-pay | Admitting: Family Medicine

## 2017-01-31 DIAGNOSIS — E7800 Pure hypercholesterolemia, unspecified: Secondary | ICD-10-CM

## 2017-01-31 MED FILL — ATORVASTATIN 80 MG TABLET: 80 | 30 days supply | Qty: 30 | Fill #0

## 2017-01-31 MED FILL — ?HYDROCHLOROTHIAZIDE 12.5MG: 12.5 | 30 days supply | Qty: 30 | Fill #0

## 2017-01-31 MED FILL — !VENTOLIN HFA INHALER: 108 (90 BAS | 25 days supply | Qty: 18 | Fill #0

## 2017-01-31 MED FILL — SYMBICORT 160-4.5 MCG INH: 160-4.5 | 30 days supply | Qty: 10 | Fill #3

## 2017-02-07 ENCOUNTER — Ambulatory Visit: Payer: Self-pay | Attending: Family Medicine

## 2017-03-03 ENCOUNTER — Other Ambulatory Visit: Payer: Self-pay

## 2017-03-16 ENCOUNTER — Other Ambulatory Visit: Payer: Self-pay | Admitting: Family Medicine

## 2017-03-16 DIAGNOSIS — R062 Wheezing: Secondary | ICD-10-CM

## 2017-03-16 DIAGNOSIS — J449 Chronic obstructive pulmonary disease, unspecified: Secondary | ICD-10-CM

## 2017-03-16 MED FILL — ATORVASTATIN 80 MG TABLET: 80 | 30 days supply | Qty: 30 | Fill #1

## 2017-03-16 MED FILL — SYMBICORT 160-4.5 MCG INH: 160-4.5 | 30 days supply | Qty: 10 | Fill #0

## 2017-03-16 MED FILL — ?HYDROCHLOROTHIAZIDE 12.5MG: 12.5 | 30 days supply | Qty: 30 | Fill #1

## 2017-03-16 NOTE — Telephone Encounter (Signed)
Please review RX request

## 2017-04-05 MED FILL — $VENTOLIN HFA 18G INHALER: 108 (90 BAS | 25 days supply | Qty: 18 | Fill #1

## 2017-04-25 MED FILL — ATORVASTATIN 80 MG TABLET: 80 | 30 days supply | Qty: 30 | Fill #2

## 2017-04-25 MED FILL — $VENTOLIN HFA 18G INHALER: 108 (90 BAS | 25 days supply | Qty: 18 | Fill #2

## 2017-04-25 MED FILL — ?HYDROCHLOROTHIAZIDE 12.5MG: 12.5 | 30 days supply | Qty: 30 | Fill #2

## 2017-04-26 MED FILL — $BREO ELLIPTA 200-25 MCG IN: 200-25 | 30 days supply | Qty: 60 | Fill #0

## 2017-05-23 ENCOUNTER — Other Ambulatory Visit: Payer: Self-pay | Admitting: Family Medicine

## 2017-05-23 DIAGNOSIS — J449 Chronic obstructive pulmonary disease, unspecified: Secondary | ICD-10-CM

## 2017-05-23 MED FILL — HYDROCHLOROTHIAZIDE 12.5 MG: 12.5 | 30 days supply | Qty: 30 | Fill #3

## 2017-05-23 MED FILL — $BREO ELLIPTA 200-25 MCG IN: 200-25 | 30 days supply | Qty: 60 | Fill #1

## 2017-05-23 MED FILL — ATORVASTATIN 80 MG TABLET: 80 | 30 days supply | Qty: 30 | Fill #3

## 2017-05-24 MED FILL — $VENTOLIN HFA 18G INHALER: 108 (90 BAS | 25 days supply | Qty: 18 | Fill #0

## 2017-05-31 ENCOUNTER — Ambulatory Visit: Payer: Self-pay | Admitting: Family Medicine

## 2017-06-23 MED FILL — HYDROCHLOROTHIAZIDE 12.5 MG: 12.5 | 30 days supply | Qty: 30 | Fill #4

## 2017-06-23 MED FILL — ATORVASTATIN 80 MG TABLET: 80 | 30 days supply | Qty: 30 | Fill #4

## 2017-06-23 MED FILL — $BREO ELLIPTA 200-25 MCG IN: 200-25 | 30 days supply | Qty: 60 | Fill #2

## 2017-06-23 MED FILL — $VENTOLIN HFA 18G INHALER: 108 (90 BAS | 25 days supply | Qty: 18 | Fill #1

## 2017-06-28 ENCOUNTER — Encounter: Payer: Self-pay | Admitting: Family Medicine

## 2017-06-28 ENCOUNTER — Ambulatory Visit (INDEPENDENT_AMBULATORY_CARE_PROVIDER_SITE_OTHER): Payer: Self-pay | Admitting: Family Medicine

## 2017-06-28 VITALS — BP 130/72 | HR 82 | Temp 98.3°F | Resp 16 | Ht 73.0 in | Wt 195.0 lb

## 2017-06-28 DIAGNOSIS — Z23 Encounter for immunization: Secondary | ICD-10-CM

## 2017-06-28 DIAGNOSIS — F17200 Nicotine dependence, unspecified, uncomplicated: Secondary | ICD-10-CM

## 2017-06-28 DIAGNOSIS — J438 Other emphysema: Secondary | ICD-10-CM

## 2017-06-28 DIAGNOSIS — I10 Essential (primary) hypertension: Secondary | ICD-10-CM

## 2017-06-28 LAB — POCT URINALYSIS DIP (DEVICE)
Bilirubin Urine: NEGATIVE
GLUCOSE, UA: NEGATIVE mg/dL
HGB URINE DIPSTICK: NEGATIVE
LEUKOCYTES UA: NEGATIVE
NITRITE: NEGATIVE
PROTEIN: NEGATIVE mg/dL
Specific Gravity, Urine: 1.02 (ref 1.005–1.030)
Urobilinogen, UA: 0.2 mg/dL (ref 0.0–1.0)
pH: 6 (ref 5.0–8.0)

## 2017-06-28 NOTE — Progress Notes (Signed)
Subjective:    Patient ID: NATHANUEL CABREJA, male    DOB: 10-Jun-1956, 62 y.o.   MRN: [id number] Ms. Ahmani Prehn, a 62 year old male with a history of hypertension and COPD presents for a 6 month follow up. Patient has been taking hydrochlorothiazide consistently, exercising, and following a low fat, low sodium diet.  He does not check blood pressures at home. Patient denies chest pain, chest pressure/discomfort, dyspnea, fatigue, lower extremity edema, orthopnea, palpitations, syncope and tachypnea.  Cardiovascular risk factors include: sedentary lifestyle and smoking/ tobacco exposure. He is trying to quit smoking. He has been wearing  Hypertension  This is a chronic problem. The current episode started more than 1 year ago. The problem has been gradually improving since onset. Pertinent negatives include no anxiety, blurred vision, chest pain, headaches, malaise/fatigue, neck pain, orthopnea, palpitations, peripheral edema, PND, shortness of breath or sweats. There are no associated agents to hypertension. Risk factors for coronary artery disease include dyslipidemia and smoking/tobacco exposure. Past treatments include diuretics. The current treatment provides moderate improvement. There are no compliance problems.  There is no history of angina, kidney disease, CAD/MI, CVA, heart failure, left ventricular hypertrophy, PVD or retinopathy.  Nicotine Dependence  Presents for initial visit. Symptoms are negative for sore throat. Preferred tobacco types include cigarettes. Preferred cigarette types include filtered. Preferred strength is regular. Preferred cigarettes are non-menthol. Preferred brands include Newport. His urge triggers include company of smokers, drinking alcohol and drinking coffee. The symptoms have been worsening. His first smoke is before 6 AM. He smokes < 1/2 a pack of cigarettes per day. He started smoking when he was >35 years old. Past treatments include nicotine patch. The  treatment provided no relief. Compliance with prior treatments has been poor. Caelan is not interested in quitting. [place] has tried to quit 1 time. There is no history of alcohol abuse. Drug use: history of cocaine use.    Past Medical History:  Diagnosis Date  . Anginal pain ([facility])   . CAD (coronary artery disease)    4V CABG 10/31/13  . Cocaine abuse ([place])   . COPD (chronic obstructive pulmonary disease) ([place])   . GERD (gastroesophageal reflux disease)   . H/O: eczema    lower extremities  . H/O: GI bleed    a. 62 y/o ago, r/t ulcer. Denies recent GIB.  Marland Kitchen High cholesterol   . Hypertension   . Myocardial infarction ([place])   . Perforation of duodenal ulcer ([place])    a. 1970s - s/p surgery.   . Shortness of breath dyspnea   . Tobacco abuse    Immunization History  Administered Date(s) Administered  . Influenza,inj,Quad PF,6+ Mos 09/02/2014, 07/06/2015, 05/30/2016, 04/11/2017  . Pneumococcal Polysaccharide-23 10/27/2013   Review of Systems  Constitutional: Negative.  Negative for chills, fever and malaise/fatigue.  HENT: Negative.  Negative for rhinorrhea and sore throat.   Eyes: Negative.  Negative for blurred vision and photophobia.  Respiratory: Negative for apnea, chest tightness, shortness of breath and wheezing.   Cardiovascular: Negative.  Negative for chest pain, palpitations, orthopnea, leg swelling and PND.  Gastrointestinal: Negative.  Negative for abdominal pain and diarrhea.  Endocrine: Negative.  Negative for polydipsia, polyphagia and polyuria.  Genitourinary: Negative for discharge, dysuria, hematuria, penile pain and penile swelling.  Musculoskeletal: Negative.  Negative for neck pain.  Skin: Negative.  Negative for rash.  Allergic/Immunologic: Negative.  Negative for immunocompromised state.  Neurological: Negative for dizziness and headaches.  Hematological: Negative.   Psychiatric/Behavioral: Negative.  Objective:   Physical Exam  Constitutional: He  is oriented to person, place, and time.  HENT:  Head: Normocephalic and atraumatic.  Right Ear: External ear normal.  Mouth/Throat: Oropharynx is clear and moist.  Eyes: Conjunctivae and EOM are normal. Pupils are equal, round, and reactive to light.  Neck: Normal range of motion. Neck supple.  Cardiovascular: Normal rate, regular rhythm, normal heart sounds and intact distal pulses.  Pulmonary/Chest: Effort normal and breath sounds normal. No accessory muscle usage. No respiratory distress. He has no decreased breath sounds. He has no wheezes. He has no rhonchi. He has no rales.  Abdominal: Soft. Bowel sounds are normal.  Musculoskeletal: Normal range of motion.  Neurological: He is alert and oriented to person, place, and time. He has normal reflexes.  Skin: Skin is warm and dry. Nails show no clubbing.  Psychiatric: His behavior is normal. Thought content normal. His mood appears not anxious. His affect is not angry and not blunt. His speech is not rapid and/or pressured. He does not exhibit a depressed mood.       BP 138/73 (BP Location: Left Arm, Patient Position: Sitting, Cuff Size: Large)   Pulse 82   Temp 98.3 F (36.8 C) (Oral)   Resp 16   Ht 6\' 1"  (1.854 m)   Wt 195 lb (88.5 kg)   SpO2 100%   BMI 25.73 kg/m  Assessment & Plan:  Essential hypertension Blood pressure is at goal on current medication regimen. Will continue hydrochlorothiazide as prescribed. Reviewed urinalysis, no proteinuria present.  - Lipid Panel - Basic Metabolic Panel  Tobacco dependence Smoking cessation instruction/counseling given:  counseled patient on the dangers of tobacco use, advised patient to stop smoking, and reviewed strategies to maximize success  Other emphysema (HCC) Continue Symbicort as previously prescribed  Need for Tdap vaccination - Tdap vaccine greater than or equal to 7yo IM   Donia Pounds  MSN, FNP-C [facility] [place], [place] [address] [phone]

## 2017-06-28 NOTE — Patient Instructions (Signed)
DASH Eating Plan DASH stands for "Dietary Approaches to Stop Hypertension." The DASH eating plan is a healthy eating plan that has been shown to reduce high blood pressure (hypertension). It may also reduce your risk for type 2 diabetes, heart disease, and stroke. The DASH eating plan may also help with weight loss. What are tips for following this plan? General guidelines  Avoid eating more than 2,300 mg (milligrams) of salt (sodium) a day. If you have hypertension, you may need to reduce your sodium intake to 1,500 mg a day.  Limit alcohol intake to no more than 1 drink a day for nonpregnant women and 2 drinks a day for men. One drink equals 12 oz of beer, 5 oz of wine, or 1 oz of hard liquor.  Work with your health care provider to maintain a healthy body weight or to lose weight. Ask what an ideal weight is for you.  Get at least 30 minutes of exercise that causes your heart to beat faster (aerobic exercise) most days of the week. Activities may include walking, swimming, or biking.  Work with your health care provider or diet and nutrition specialist (dietitian) to adjust your eating plan to your individual calorie needs. Reading food labels  Check food labels for the amount of sodium per serving. Choose foods with less than 5 percent of the Daily Value of sodium. Generally, foods with less than 300 mg of sodium per serving fit into this eating plan.  To find whole grains, look for the word "whole" as the first word in the ingredient list. Shopping  Buy products labeled as "low-sodium" or "no salt added."  Buy fresh foods. Avoid canned foods and premade or frozen meals. Cooking  Avoid adding salt when cooking. Use salt-free seasonings or herbs instead of table salt or sea salt. Check with your health care provider or pharmacist before using salt substitutes.  Do not fry foods. Cook foods using healthy methods such as baking, boiling, grilling, and broiling instead.  Cook with  heart-healthy oils, such as olive, canola, soybean, or sunflower oil. Meal planning   Eat a balanced diet that includes: ? 5 or more servings of fruits and vegetables each day. At each meal, try to fill half of your plate with fruits and vegetables. ? Up to 6-8 servings of whole grains each day. ? Less than 6 oz of lean meat, poultry, or fish each day. A 3-oz serving of meat is about the same size as a deck of cards. One egg equals 1 oz. ? 2 servings of low-fat dairy each day. ? A serving of nuts, seeds, or beans 5 times each week. ? Heart-healthy fats. Healthy fats called Omega-3 fatty acids are found in foods such as flaxseeds and coldwater fish, like sardines, salmon, and mackerel.  Limit how much you eat of the following: ? Canned or prepackaged foods. ? Food that is high in trans fat, such as fried foods. ? Food that is high in saturated fat, such as fatty meat. ? Sweets, desserts, sugary drinks, and other foods with added sugar. ? Full-fat dairy products.  Do not salt foods before eating.  Try to eat at least 2 vegetarian meals each week.  Eat more home-cooked food and less restaurant, buffet, and fast food.  When eating at a restaurant, ask that your food be prepared with less salt or no salt, if possible. What foods are recommended? The items listed may not be a complete list. Talk with your dietitian about what   dietary choices are best for you. Grains Whole-grain or whole-wheat bread. Whole-grain or whole-wheat pasta. Brown rice. Oatmeal. Quinoa. Bulgur. Whole-grain and low-sodium cereals. Pita bread. Low-fat, low-sodium crackers. Whole-wheat flour tortillas. Vegetables Fresh or frozen vegetables (raw, steamed, roasted, or grilled). Low-sodium or reduced-sodium tomato and vegetable juice. Low-sodium or reduced-sodium tomato sauce and tomato paste. Low-sodium or reduced-sodium canned vegetables. Fruits All fresh, dried, or frozen fruit. Canned fruit in natural juice (without  added sugar). Meat and other protein foods Skinless chicken or turkey. Ground chicken or turkey. Pork with fat trimmed off. Fish and seafood. Egg whites. Dried beans, peas, or lentils. Unsalted nuts, nut butters, and seeds. Unsalted canned beans. Lean cuts of beef with fat trimmed off. Low-sodium, lean deli meat. Dairy Low-fat (1%) or fat-free (skim) milk. Fat-free, low-fat, or reduced-fat cheeses. Nonfat, low-sodium ricotta or cottage cheese. Low-fat or nonfat yogurt. Low-fat, low-sodium cheese. Fats and oils Soft margarine without trans fats. Vegetable oil. Low-fat, reduced-fat, or light mayonnaise and salad dressings (reduced-sodium). Canola, safflower, olive, soybean, and sunflower oils. Avocado. Seasoning and other foods Herbs. Spices. Seasoning mixes without salt. Unsalted popcorn and pretzels. Fat-free sweets. What foods are not recommended? The items listed may not be a complete list. Talk with your dietitian about what dietary choices are best for you. Grains Baked goods made with fat, such as croissants, muffins, or some breads. Dry pasta or rice meal packs. Vegetables Creamed or fried vegetables. Vegetables in a cheese sauce. Regular canned vegetables (not low-sodium or reduced-sodium). Regular canned tomato sauce and paste (not low-sodium or reduced-sodium). Regular tomato and vegetable juice (not low-sodium or reduced-sodium). Pickles. Olives. Fruits Canned fruit in a light or heavy syrup. Fried fruit. Fruit in cream or butter sauce. Meat and other protein foods Fatty cuts of meat. Ribs. Fried meat. Bacon. Sausage. Bologna and other processed lunch meats. Salami. Fatback. Hotdogs. Bratwurst. Salted nuts and seeds. Canned beans with added salt. Canned or smoked fish. Whole eggs or egg yolks. Chicken or turkey with skin. Dairy Whole or 2% milk, cream, and half-and-half. Whole or full-fat cream cheese. Whole-fat or sweetened yogurt. Full-fat cheese. Nondairy creamers. Whipped toppings.  Processed cheese and cheese spreads. Fats and oils Butter. Stick margarine. Lard. Shortening. Ghee. Bacon fat. Tropical oils, such as coconut, palm kernel, or palm oil. Seasoning and other foods Salted popcorn and pretzels. Onion salt, garlic salt, seasoned salt, table salt, and sea salt. Worcestershire sauce. Tartar sauce. Barbecue sauce. Teriyaki sauce. Soy sauce, including reduced-sodium. Steak sauce. Canned and packaged gravies. Fish sauce. Oyster sauce. Cocktail sauce. Horseradish that you find on the shelf. Ketchup. Mustard. Meat flavorings and tenderizers. Bouillon cubes. Hot sauce and Tabasco sauce. Premade or packaged marinades. Premade or packaged taco seasonings. Relishes. Regular salad dressings. Where to find more information:  National Heart, Lung, and Blood Institute: www.nhlbi.nih.gov  American Heart Association: www.heart.org Summary  The DASH eating plan is a healthy eating plan that has been shown to reduce high blood pressure (hypertension). It may also reduce your risk for type 2 diabetes, heart disease, and stroke.  With the DASH eating plan, you should limit salt (sodium) intake to 2,300 mg a day. If you have hypertension, you may need to reduce your sodium intake to 1,500 mg a day.  When on the DASH eating plan, aim to eat more fresh fruits and vegetables, whole grains, lean proteins, low-fat dairy, and heart-healthy fats.  Work with your health care provider or diet and nutrition specialist (dietitian) to adjust your eating plan to your individual   calorie needs. This information is not intended to replace advice given to you by your health care provider. Make sure you discuss any questions you have with your health care provider. Document Released: 05/19/2011 Document Revised: 05/23/2016 Document Reviewed: 05/23/2016 Elsevier Interactive Patient Education  2018 [employer] Inc.  

## 2017-06-29 LAB — BASIC METABOLIC PANEL
BUN / CREAT RATIO: 5 — AB (ref 10–24)
BUN: 6 mg/dL — AB (ref 8–27)
CO2: 24 mmol/L (ref 20–29)
CREATININE: 1.13 mg/dL (ref 0.76–1.27)
Calcium: 9.1 mg/dL (ref 8.6–10.2)
Chloride: 101 mmol/L (ref 96–106)
GFR calc Af Amer: 80 mL/min/{1.73_m2} (ref >59)
GFR, EST NON AFRICAN AMERICAN: 69 mL/min/{1.73_m2} (ref >59)
Glucose: 83 mg/dL (ref 65–99)
Potassium: 4.1 mmol/L (ref 3.5–5.2)
Sodium: 141 mmol/L (ref 134–144)

## 2017-06-29 LAB — LIPID PANEL
CHOLESTEROL TOTAL: 156 mg/dL (ref 100–199)
Chol/HDL Ratio: 2.2 ratio (ref 0.0–5.0)
HDL: 72 mg/dL (ref >39)
LDL Calculated: 65 mg/dL (ref 0–99)
Triglycerides: 97 mg/dL (ref 0–149)
VLDL Cholesterol Cal: 19 mg/dL (ref 5–40)

## 2017-07-19 MED FILL — ?HYDROCHLOROTHIAZIDE 12.5MG: 12.5 | 30 days supply | Qty: 30 | Fill #5

## 2017-07-19 MED FILL — $BREO ELLIPTA 200-25 MCG IN: 200-25 | 90 days supply | Qty: 180 | Fill #3

## 2017-07-19 MED FILL — $VENTOLIN HFA 18G INHALER: 108 (90 BAS | 25 days supply | Qty: 18 | Fill #2

## 2017-07-19 MED FILL — ATORVASTATIN 80 MG TABLET: 80 | 30 days supply | Qty: 30 | Fill #5

## 2017-08-29 ENCOUNTER — Other Ambulatory Visit: Payer: Self-pay | Admitting: Family Medicine

## 2017-08-29 DIAGNOSIS — E7800 Pure hypercholesterolemia, unspecified: Secondary | ICD-10-CM

## 2017-08-29 DIAGNOSIS — I10 Essential (primary) hypertension: Secondary | ICD-10-CM

## 2017-08-30 ENCOUNTER — Other Ambulatory Visit: Payer: Self-pay | Admitting: Family Medicine

## 2017-08-30 ENCOUNTER — Ambulatory Visit: Payer: Self-pay

## 2017-08-30 DIAGNOSIS — I10 Essential (primary) hypertension: Secondary | ICD-10-CM

## 2017-08-30 MED FILL — ATORVASTATIN 80 MG TABLET: 80 | 30 days supply | Qty: 30 | Fill #0

## 2017-08-30 MED FILL — HYDROCHLOROTHIAZIDE 12.5 MG: 12.5 | 30 days supply | Qty: 30 | Fill #0

## 2017-09-15 ENCOUNTER — Ambulatory Visit: Payer: Self-pay

## 2017-10-10 ENCOUNTER — Other Ambulatory Visit: Payer: Self-pay | Admitting: Family Medicine

## 2017-10-10 DIAGNOSIS — J449 Chronic obstructive pulmonary disease, unspecified: Secondary | ICD-10-CM

## 2017-10-10 MED FILL — $VENTOLIN HFA 18G INHALER: 108 (90 BAS | 75 days supply | Qty: 54 | Fill #0

## 2017-10-10 MED FILL — HYDROCHLOROTHIAZIDE 12.5 MG: 12.5 | 30 days supply | Qty: 30 | Fill #1

## 2017-10-10 MED FILL — $BREO ELLIPTA 200-25 MCG IN: 200-25 | 90 days supply | Qty: 180 | Fill #4

## 2017-10-10 MED FILL — ATORVASTATIN 80 MG TABLET: 80 | 30 days supply | Qty: 30 | Fill #1

## 2017-10-11 ENCOUNTER — Ambulatory Visit: Payer: Self-pay | Attending: Family Medicine

## 2017-11-04 ENCOUNTER — Emergency Department (HOSPITAL_COMMUNITY): Payer: Self-pay

## 2017-11-04 ENCOUNTER — Inpatient Hospital Stay (HOSPITAL_COMMUNITY)
Admission: EM | Admit: 2017-11-04 | Discharge: 2017-11-08 | DRG: 038 | Disposition: A | Discharge status: Home / Self Care | Payer: Self-pay | Attending: Internal Medicine | Admitting: Internal Medicine

## 2017-11-04 ENCOUNTER — Encounter (HOSPITAL_COMMUNITY): Payer: Self-pay

## 2017-11-04 ENCOUNTER — Other Ambulatory Visit: Payer: Self-pay

## 2017-11-04 DIAGNOSIS — H3410 Central retinal artery occlusion, unspecified eye: Secondary | ICD-10-CM | POA: Diagnosis present

## 2017-11-04 DIAGNOSIS — H5462 Unqualified visual loss, left eye, normal vision right eye: Secondary | ICD-10-CM | POA: Diagnosis present

## 2017-11-04 DIAGNOSIS — I255 Ischemic cardiomyopathy: Secondary | ICD-10-CM | POA: Diagnosis present

## 2017-11-04 DIAGNOSIS — I6522 Occlusion and stenosis of left carotid artery: Secondary | ICD-10-CM

## 2017-11-04 DIAGNOSIS — D62 Acute posthemorrhagic anemia: Secondary | ICD-10-CM | POA: Diagnosis present

## 2017-11-04 DIAGNOSIS — F1410 Cocaine abuse, uncomplicated: Secondary | ICD-10-CM | POA: Diagnosis present

## 2017-11-04 DIAGNOSIS — F17210 Nicotine dependence, cigarettes, uncomplicated: Secondary | ICD-10-CM | POA: Diagnosis present

## 2017-11-04 DIAGNOSIS — K279 Peptic ulcer, site unspecified, unspecified as acute or chronic, without hemorrhage or perforation: Secondary | ICD-10-CM

## 2017-11-04 DIAGNOSIS — E785 Hyperlipidemia, unspecified: Secondary | ICD-10-CM | POA: Diagnosis present

## 2017-11-04 DIAGNOSIS — K921 Melena: Secondary | ICD-10-CM | POA: Diagnosis present

## 2017-11-04 DIAGNOSIS — H349 Unspecified retinal vascular occlusion: Secondary | ICD-10-CM

## 2017-11-04 DIAGNOSIS — Z720 Tobacco use: Secondary | ICD-10-CM | POA: Diagnosis present

## 2017-11-04 DIAGNOSIS — I2510 Atherosclerotic heart disease of native coronary artery without angina pectoris: Secondary | ICD-10-CM | POA: Diagnosis present

## 2017-11-04 DIAGNOSIS — E7800 Pure hypercholesterolemia, unspecified: Secondary | ICD-10-CM | POA: Diagnosis present

## 2017-11-04 DIAGNOSIS — Z79899 Other long term (current) drug therapy: Secondary | ICD-10-CM

## 2017-11-04 DIAGNOSIS — J438 Other emphysema: Secondary | ICD-10-CM

## 2017-11-04 DIAGNOSIS — B3781 Candidal esophagitis: Secondary | ICD-10-CM | POA: Diagnosis present

## 2017-11-04 DIAGNOSIS — Z951 Presence of aortocoronary bypass graft: Secondary | ICD-10-CM

## 2017-11-04 DIAGNOSIS — K219 Gastro-esophageal reflux disease without esophagitis: Secondary | ICD-10-CM | POA: Diagnosis present

## 2017-11-04 DIAGNOSIS — K922 Gastrointestinal hemorrhage, unspecified: Secondary | ICD-10-CM

## 2017-11-04 DIAGNOSIS — I252 Old myocardial infarction: Secondary | ICD-10-CM

## 2017-11-04 DIAGNOSIS — Z7902 Long term (current) use of antithrombotics/antiplatelets: Secondary | ICD-10-CM

## 2017-11-04 DIAGNOSIS — I6529 Occlusion and stenosis of unspecified carotid artery: Secondary | ICD-10-CM

## 2017-11-04 DIAGNOSIS — I63232 Cerebral infarction due to unspecified occlusion or stenosis of left carotid arteries: Principal | ICD-10-CM | POA: Diagnosis present

## 2017-11-04 DIAGNOSIS — F17200 Nicotine dependence, unspecified, uncomplicated: Secondary | ICD-10-CM

## 2017-11-04 DIAGNOSIS — R29700 NIHSS score 0: Secondary | ICD-10-CM | POA: Diagnosis present

## 2017-11-04 DIAGNOSIS — J449 Chronic obstructive pulmonary disease, unspecified: Secondary | ICD-10-CM | POA: Diagnosis present

## 2017-11-04 DIAGNOSIS — I10 Essential (primary) hypertension: Secondary | ICD-10-CM | POA: Diagnosis present

## 2017-11-04 DIAGNOSIS — D649 Anemia, unspecified: Secondary | ICD-10-CM

## 2017-11-04 DIAGNOSIS — E876 Hypokalemia: Secondary | ICD-10-CM | POA: Diagnosis present

## 2017-11-04 DIAGNOSIS — Z7982 Long term (current) use of aspirin: Secondary | ICD-10-CM

## 2017-11-04 DIAGNOSIS — F1010 Alcohol abuse, uncomplicated: Secondary | ICD-10-CM | POA: Diagnosis present

## 2017-11-04 LAB — CBC WITH DIFFERENTIAL/PLATELET
BASOS ABS: 0.1 10*3/uL (ref 0.0–0.1)
BASOS PCT: 1 %
EOS ABS: 0.4 10*3/uL (ref 0.0–0.7)
Eosinophils Relative: 6 %
HCT: 15.4 % — ABNORMAL LOW (ref 39.0–52.0)
Hemoglobin: 4.7 g/dL — CL (ref 13.0–17.0)
LYMPHS ABS: 1.7 10*3/uL (ref 0.7–4.0)
Lymphocytes Relative: 25 %
MCH: 22.7 pg — AB (ref 26.0–34.0)
MCHC: 30.5 g/dL (ref 30.0–36.0)
MCV: 74.4 fL — ABNORMAL LOW (ref 78.0–100.0)
Monocytes Absolute: 0.7 10*3/uL (ref 0.1–1.0)
Monocytes Relative: 11 %
NEUTROS ABS: 3.7 10*3/uL (ref 1.7–7.7)
Neutrophils Relative %: 57 %
Platelets: 339 10*3/uL (ref 150–400)
RBC: 2.07 MIL/uL — ABNORMAL LOW (ref 4.22–5.81)
RDW: 21.6 % — AB (ref 11.5–15.5)
WBC: 6.6 10*3/uL (ref 4.0–10.5)

## 2017-11-04 LAB — RAPID URINE DRUG SCREEN, HOSP PERFORMED
Amphetamines: NOT DETECTED
Barbiturates: NOT DETECTED
Benzodiazepines: NOT DETECTED
Cocaine: POSITIVE — AB
Opiates: NOT DETECTED
Tetrahydrocannabinol: POSITIVE — AB

## 2017-11-04 LAB — BASIC METABOLIC PANEL
ANION GAP: 11 (ref 5–15)
BUN: 11 mg/dL (ref 6–20)
CO2: 24 mmol/L (ref 22–32)
Calcium: 8.5 mg/dL — ABNORMAL LOW (ref 8.9–10.3)
Chloride: 101 mmol/L (ref 101–111)
Creatinine, Ser: 1.05 mg/dL (ref 0.61–1.24)
GFR calc non Af Amer: >60 mL/min (ref >60)
Glucose, Bld: 97 mg/dL (ref 65–99)
POTASSIUM: 2.9 mmol/L — AB (ref 3.5–5.1)
Sodium: 136 mmol/L (ref 135–145)

## 2017-11-04 LAB — HEMOGLOBIN AND HEMATOCRIT, BLOOD
HEMATOCRIT: 14.4 % — AB (ref 39.0–52.0)
Hemoglobin: 4.3 g/dL — CL (ref 13.0–17.0)

## 2017-11-04 LAB — SAMPLE TO BLOOD BANK

## 2017-11-04 LAB — POC OCCULT BLOOD, ED: Fecal Occult Bld: NEGATIVE

## 2017-11-04 LAB — I-STAT TROPONIN, ED: Troponin i, poc: 0.03 ng/mL (ref 0.00–0.08)

## 2017-11-04 LAB — PROTIME-INR
INR: 0.93
PROTHROMBIN TIME: 12.4 s (ref 11.4–15.2)

## 2017-11-04 LAB — SEDIMENTATION RATE: SED RATE: 45 mm/h — AB (ref 0–16)

## 2017-11-04 LAB — ABO/RH: ABO/RH(D): O POS

## 2017-11-04 LAB — PREPARE RBC (CROSSMATCH)

## 2017-11-04 MED ORDER — LORAZEPAM 2 MG/ML IJ SOLN: 2.0000 mg | INTRAMUSCULAR | Status: DC | PRN

## 2017-11-04 MED ORDER — PANTOPRAZOLE SODIUM 40 MG IV SOLR
40.0000 mg | Freq: Two times a day (BID) | INTRAVENOUS | Status: DC
  Administered 2017-11-04: 40 mg via INTRAVENOUS
  Filled 2017-11-04 (×3): qty 40

## 2017-11-04 MED ORDER — ONDANSETRON HCL 4 MG PO TABS: 4.0000 mg | ORAL_TABLET | Freq: Four times a day (QID) | ORAL | Status: DC | PRN

## 2017-11-04 MED ORDER — ALBUTEROL SULFATE (2.5 MG/3ML) 0.083% IN NEBU
2.5000 mg | INHALATION_SOLUTION | Freq: Four times a day (QID) | RESPIRATORY_TRACT | Status: DC | PRN
  Administered 2017-11-05 – 2017-11-07 (×3): 2.5 mg via RESPIRATORY_TRACT
  Filled 2017-11-04 (×3): qty 3

## 2017-11-04 MED ORDER — POTASSIUM CHLORIDE CRYS ER 20 MEQ PO TBCR
40.0000 meq | EXTENDED_RELEASE_TABLET | Freq: Once | ORAL | Status: AC
  Administered 2017-11-04: 40 meq via ORAL
  Filled 2017-11-04: qty 2

## 2017-11-04 MED ORDER — IOPAMIDOL (ISOVUE-370) INJECTION 76%
100.0000 mL | Freq: Once | INTRAVENOUS | Status: AC | PRN
  Administered 2017-11-04: 100 mL via INTRAVENOUS

## 2017-11-04 MED ORDER — IOPAMIDOL (ISOVUE-370) INJECTION 76%
INTRAVENOUS | Status: AC
  Filled 2017-11-04: qty 100

## 2017-11-04 MED ORDER — ONDANSETRON HCL 4 MG/2ML IJ SOLN
4.0000 mg | Freq: Four times a day (QID) | INTRAMUSCULAR | Status: DC | PRN
  Administered 2017-11-07: 4 mg via INTRAVENOUS
  Filled 2017-11-04: qty 2

## 2017-11-04 MED ORDER — SODIUM CHLORIDE 0.9 % IV SOLN
10.0000 mL/h | Freq: Once | INTRAVENOUS | Status: AC
  Administered 2017-11-04: 10 mL/h via INTRAVENOUS

## 2017-11-04 MED ORDER — SODIUM CHLORIDE 0.9 % IV SOLN
8.0000 mg/h | INTRAVENOUS | Status: DC
  Filled 2017-11-04: qty 80

## 2017-11-04 NOTE — ED Notes (Signed)
CRITICAL VALUE STICKER  CRITICAL VALUE: Hgb 4.3  RECEIVER (on-site recipient of call): Maylon Cos T RN  DATE & TIME NOTIFIED: 11/04/17 339p  MESSENGER (representative from lab): Philis Nettle  MD NOTIFIED: Esperanza Sheets PA  TIME OF NOTIFICATION: 339p  RESPONSE: see orders

## 2017-11-04 NOTE — ED Provider Notes (Signed)
Patient reports that he had diminished vision in hisleft eye last night. This morning he awakened with complete loss of vision and no perception of light in his left eye. Right eye is normal. He denies pain anywhere. No focal numbness or weakness. No difficulty speaking. On exam patient is alert Glasgow Coma Score 15 HEENT exam no facial asymmetry. Bilateral eyes with no redness. Left eye pupil 3 mm, does not react to light. Right eye 3 mm, react to light. Visual acuity he is able to finger count with right eye . He is unable to perceive light with left eye   Orlie Dakin, MD 11/04/17 1340

## 2017-11-04 NOTE — ED Triage Notes (Signed)
He reports awakening this morning "and I can't see out my left eye" [sic]. He denies pain, nor any type of current illness. I do see well-developed cataracts bilaterally upon examining his pupils.

## 2017-11-04 NOTE — H&P (Signed)
History and Physical    [name]:[id number] DOB: Sep 28, 1955 DOA: 11/04/2017  PCP: Dorena Dew, FNP   Patient coming from: Home  Chief Complaint: Left Eye Vision Loss; SOB; Abdominal Pain  HPI: [name] is a 62 y.o. male with medical history significant of CAD status post four-vessel CABG, COPD, history of GERD and history of GI bleed as well as perforation of duodenal ulcer, Tobacco abuse, history of polysubstance abuse including cocaine, hypertension, hyperlipidemia, history of eczema and other comorbidities who presented to [facility] emergency room with a chief complaint of left eye vision loss.  Patient states that about 2 to 3 days ago he lost vision in his eye however states that it got worse today and was not able to see properly and described it as if everything was "black".  Patient also endorsed abdominal pain and shortness of breath have been going 2 to 3 weeks.  States that he felt like he was having another ulcer which it felt similar to in the past.  Endorsed coffee-ground emesis as well as abdominal pain and dark black stools for 2 to 3 weeks however they have stopped.  Patient states that he continues to drink alcohol, smoke tobacco, as well as do cocaine and marijuana.  Denies any other complaints of concern except he states that he had some nausea and poor appetite after he is abdomen started hurting.  Denied chest pain but does endorse weakness, shortness of breath especially with ambulation, lightheadedness and occasional dizziness.  No burning or discomfort in urine.  TRH was called to admit this patient for left eye vision loss as well as suspected upper GI bleed.  ED Course: Had basic blood work done as well as CTA of the head and neck.  Neurology was called and patient was given potassium of treatment.  Patient to be transferred to [facility] for further evaluation.  Review of Systems: As per HPI otherwise 10 point review of systems negative.   Past  Medical History:  Diagnosis Date  . Anginal pain ([place])   . CAD (coronary artery disease)    4V CABG 10/31/13  . Cocaine abuse ([place])   . COPD (chronic obstructive pulmonary disease) ([place])   . GERD (gastroesophageal reflux disease)   . H/O: eczema    lower extremities  . H/O: GI bleed    a. 62 y/o ago, r/t ulcer. Denies recent GIB.  Marland Kitchen High cholesterol   . Hypertension   . Myocardial infarction ([place])   . Perforation of duodenal ulcer ([place])    a. 1970s - s/p surgery.   . Shortness of breath dyspnea   . Tobacco abuse    Past Surgical History:  Procedure Laterality Date  . CORONARY ARTERY BYPASS GRAFT N/A 10/31/2013   Procedure: CORONARY ARTERY BYPASS GRAFTING TIMES FOUR ON PUMP USING LEFT INTERNAL MAMMARY ARTERY AND RIGHT GREATER SAPHENOUS VEIN VIA ENDOVEIN HARVEST.;  Surgeon: Melrose Nakayama, MD;  Location: [place];  Service: Open Heart Surgery;  Laterality: N/A;  . INTRAOPERATIVE TRANSESOPHAGEAL ECHOCARDIOGRAM N/A 10/31/2013   Procedure: INTRAOPERATIVE TRANSESOPHAGEAL ECHOCARDIOGRAM;  Surgeon: Melrose Nakayama, MD;  Location: [place];  Service: Open Heart Surgery;  Laterality: N/A;  . LEFT HEART CATHETERIZATION WITH CORONARY ANGIOGRAM N/A 10/28/2013   Procedure: LEFT HEART CATHETERIZATION WITH CORONARY ANGIOGRAM;  Surgeon: Burnell Blanks, MD;  Location: [facility] CATH LAB;  Service: Cardiovascular;  Laterality: N/A;  . stomach sx     SOCIAL HISTORY  reports that he has been  smoking cigarettes.  He has a 20.00 pack-year smoking history. He has never used smokeless tobacco. He reports that he drinks about 1.2 oz of alcohol per week. He reports that he has current or past drug history. Drugs: Marijuana and Cocaine.  ALLERGIES No Known Allergies  Family History  Problem Relation Age of Onset  . Lung cancer Unknown     Prior to Admission medications   Medication Sig Start Date End Date Taking? Authorizing Provider  aspirin 325 MG EC tablet Take 1 tablet (325 mg total) by mouth  daily. 11/19/15  Yes Dorena Dew, FNP  atorvastatin (LIPITOR) 80 MG tablet TAKE 1 TABLET BY MOUTH DAILY AT 6 PM. 08/30/17  Yes Dorena Dew, FNP  fluticasone [facility]) 50 MCG/ACT nasal spray Place 2 sprays into both nostrils as needed for allergies or rhinitis.   Yes [provider]  fluticasone furoate-vilanterol (BREO ELLIPTA) 200-25 MCG/INH AEPB Inhale 1 puff into the lungs daily. 01/27/17  Yes Jegede, Olugbemiga E, MD  hydrochlorothiazide (MICROZIDE) 12.5 MG capsule TAKE 1 CAPSULE BY MOUTH DAILY. 08/30/17  Yes Dorena Dew, FNP  triamcinolone (KENALOG) 0.025 % ointment APPLY 1 APPLICATION TOPICALLY 2 TIMES DAILY. Patient taking differently: APPLY 1 APPLICATION TOPICALLY 2 TIMES qd prn rash 10/20/16  Yes Dorena Dew, FNP  VENTOLIN HFA 108 (90 Base) MCG/ACT inhaler INHALE 2 PUFFS INTO THE LUNGS EVERY 6 HOURS AS NEEDED FOR WHEEZING OR SHORTNESS OF BREATH. 10/10/17  Yes Dorena Dew, FNP  albuterol (PROVENTIL HFA;VENTOLIN HFA) 108 (90 Base) MCG/ACT inhaler Inhale 2 puffs into the lungs every 6 (six) hours as needed for wheezing or shortness of breath. Patient not taking: Reported on 06/28/2017 01/27/17   Tresa Garter, MD  budesonide-formoterol [facility]) 160-4.5 MCG/ACT inhaler Inhale 2 puffs into the lungs 2 (two) times daily. Patient not taking: Reported on 06/28/2017 11/28/16   Dorena Dew, FNP  hydrochlorothiazide (MICROZIDE) 12.5 MG capsule TAKE 1 CAPSULE BY MOUTH DAILY. Patient not taking: Reported on 11/04/2017 01/13/17   Dorena Dew, FNP  hydrochlorothiazide (MICROZIDE) 12.5 MG capsule TAKE 1 CAPSULE BY MOUTH DAILY. Patient not taking: Reported on 11/04/2017 08/30/17   Dorena Dew, FNP  nicotine (NICODERM CQ) 14 mg/24hr patch Place 1 patch (14 mg total) onto the skin daily. Patient not taking: Reported on 06/28/2017 11/28/16   Dorena Dew, FNP  SYMBICORT 160-4.5 MCG/ACT inhaler INHALE 2 PUFFS INTO THE LUNGS 2 TIMES DAILY. Patient not  taking: Reported on 11/04/2017 03/16/17   Dorena Dew, FNP    Physical Exam: Vitals:   11/04/17 1600 11/04/17 1700 11/04/17 1744 11/04/17 1800  BP: 107/71 105/90 (!) 106/55 (!) 117/57  Pulse: 69 80 74 71  Resp: _0 Temp:   97.8 F (36.6 C) 97.8 F (36.6 C)  TempSrc:   Oral Oral  SpO2: 100% 100% 100% 100%  Weight:      Height:       Constitutional: WN/WD AAF male in NAD and appears calm and comfortable Eyes: Lids and conjunctivae normal, sclerae anicteric  ENMT: External Ears, Nose appear normal. Grossly normal hearing. Mucous membranes are moist.  Neck: Appears normal, supple, no cervical masses, normal ROM, no appreciable thyromegaly; no JVD Respiratory: Diminished to auscultation bilaterally, no wheezing, rales, rhonchi or crackles. Normal respiratory effort and patient is not tachypenic. No accessory muscle use.  Cardiovascular: RRR, no murmurs / rubs / gallops. S1 and S2 auscultated. No extremity edema.   Abdomen: Soft, Mildly tender, non-distended.  No masses palpated. No appreciable hepatosplenomegaly. Bowel sounds positive x4.  GU: Deferred. Musculoskeletal: No clubbing / cyanosis of digits/nails. No joint deformity upper and lower extremities. Skin: No rashes, lesions, ulcers on a limited skin evaluation. No induration; Warm and dry.  Neurologic: Moves all extremities independently. Romberg sign and cerebellar reflexes not assessed.  Psychiatric: Normal judgment and insight. Alert and oriented x 3. Normal mood and appropriate affect.   Labs on Admission: I have personally reviewed following labs and imaging studies  CBC: Recent Labs  Lab 11/04/17 1353 11/04/17 1502  WBC 6.6  --   NEUTROABS 3.7  --   HGB 4.7* 4.3*  HCT 15.4* 14.4*  MCV 74.4*  --   PLT 339  --    Basic Metabolic Panel: Recent Labs  Lab 11/04/17 1353  NA 136  K 2.9*  CL 101  CO2 24  GLUCOSE 97  BUN 11  CREATININE 1.05  CALCIUM 8.5*   GFR: Estimated Creatinine Clearance: 80.3  mL/min (by C-G formula based on SCr of 1.05 mg/dL). Liver Function Tests: No results for input(s): AST, ALT, ALKPHOS, BILITOT, PROT, ALBUMIN in the last 168 hours. No results for input(s): LIPASE, AMYLASE in the last 168 hours. No results for input(s): AMMONIA in the last 168 hours. Coagulation Profile: Recent Labs  Lab 11/04/17 1353  INR 0.93   Cardiac Enzymes: No results for input(s): CKTOTAL, CKMB, CKMBINDEX, TROPONINI in the last 168 hours. BNP (last 3 results) No results for input(s): PROBNP in the last 8760 hours. HbA1C: No results for input(s): HGBA1C in the last 72 hours. CBG: No results for input(s): GLUCAP in the last 168 hours. Lipid Profile: No results for input(s): CHOL, HDL, LDLCALC, TRIG, CHOLHDL, LDLDIRECT in the last 72 hours. Thyroid Function Tests: No results for input(s): TSH, T4TOTAL, FREET4, T3FREE, THYROIDAB in the last 72 hours. Anemia Panel: Recent Labs    11/04/17 1549  RETICCTPCT >23.0*   Urine analysis:    Component Value Date/Time   COLORURINE YELLOW 07/02/2015 1006   APPEARANCEUR CLEAR 07/02/2015 1006   LABSPEC 1.020 06/28/2017 1152   PHURINE 6.0 06/28/2017 1152   GLUCOSEU NEGATIVE 06/28/2017 1152   HGBUR NEGATIVE 06/28/2017 1152   BILIRUBINUR NEGATIVE 06/28/2017 1152   KETONESUR TRACE (A) 06/28/2017 1152   PROTEINUR NEGATIVE 06/28/2017 1152   UROBILINOGEN 0.2 06/28/2017 1152   NITRITE NEGATIVE 06/28/2017 1152   LEUKOCYTESUR NEGATIVE 06/28/2017 1152   Sepsis Labs: !!!!!!!!!!!!!!!!!!!!!!!!!!!!!!!!!!!!!!!!!!!! _0 (procalcitonin:4,lacticidven:4) )No results found for this or any previous visit (from the past 240 hour(s)).   Radiological Exams on Admission: Ct Angio Head W Or Wo Contrast  Result Date: 11/04/2017 CLINICAL DATA:  New onset of vision loss in the left eye. The patient has no perception of light in the left eye. He began to have diminished vision in the left eye last evening. He awoke with no vision in the left eye  today. EXAM: CT ANGIOGRAPHY HEAD AND NECK TECHNIQUE: Multidetector CT imaging of the head and neck was performed using the standard protocol during bolus administration of intravenous contrast. Multiplanar CT image reconstructions and MIPs were obtained to evaluate the vascular anatomy. Carotid stenosis measurements (when applicable) are obtained utilizing NASCET criteria, using the distal internal carotid diameter as the denominator. CONTRAST:  130m ISOVUE-370 IOPAMIDOL (ISOVUE-370) INJECTION 76% COMPARISON:  None. FINDINGS: CT HEAD FINDINGS Brain: Noncontrast imaging the brain demonstrates scattered periventricular white matter hypoattenuation bilaterally. In age indeterminate lacunar infarct is present within the anterior limb of the left internal capsule. A ridging is  white matter hypoattenuation is present in the inferior cerebellum bilaterally. No acute hemorrhage or mass lesion is present. The ventricles are of normal size. No significant extra-axial fluid collection is present. Vascular: No hyperdense vessel or unexpected calcification. Skull: Calvarium is intact. No focal lytic or blastic lesions are present. Sinuses: The paranasal sinuses and mastoid air cells are clear. Orbits: Bilateral globes and orbits are within normal limits. Review of the MIP images confirms the above findings CTA NECK FINDINGS Aortic arch: A 3 vessel arch configuration is present. Atherosclerotic calcifications are present without significant stenosis at the origins the great vessels. There is no aneurysm. Right carotid system: The right common carotid artery is within normal limits. Mild tortuosity is present in the proximal cervical right ICA. There is focal calcification along the posterior wall of the distal right ICA. No stenosis is present. Left carotid system: The left common carotid artery is within normal limits proximally. There is focal stenosis of the mid left common carotid artery. The lumen is narrowed to 1.6 mm.  This compares with a more distal segment of 6.5 mm. Soft tissue plaque is noted along the posterior aspect of the left carotid bulb without significant luminal stenosis. Focal calcification is present along the posterior wall of the distal left internal carotid artery just below the skull base without significant stenosis. Vertebral arteries: The vertebral arteries are codominant. Both vertebral arteries originate from the subclavian arteries without significant stenoses. There is no significant stenosis of either vertebral artery in the neck. Skeleton: Focal endplate changes are present with uncovertebral spurring at C6-7. Bilateral osseous foraminal narrowing is worse on the right. Vertebral body heights and alignment are otherwise normal. The patient is status post median sternotomy. An unerupted right maxillary and I tooth is present. An adjacent hypoplastic on are up to tooth is present. The patient is otherwise edentulous along the maxilla. Other neck: Soft tissue lipoma is present along the left occipital scalp. The soft tissues of the neck are unremarkable otherwise. Thyroid is within normal limits. Salivary glands are unremarkable. No significant adenopathy is present. Upper chest: Centrilobular emphysematous changes are present. No focal nodule or mass lesion is present. No significant pleural effusion is present. Review of the MIP images confirms the above findings CTA HEAD FINDINGS Anterior circulation: The internal carotid arteries are within normal limits bilaterally from the skull base through the ICA termini. The right A1 segment is dominant. The anterior communicating artery is patent. MCA bifurcation is normal bilaterally. The ACA and MCA branch vessels are within normal limits. Posterior circulation: The vertebral arteries are codominant. Vertebrobasilar junction is normal. PICA origins are visualized and within normal limits. Both posterior cerebral arteries originate from the basilar tip. There  is mild irregularity of distal branch vessels without a significant proximal stenosis or occlusion. Venous sinuses: The dural sinuses are patent. Right transverse sinus is dominant. The straight sinus and deep cerebral veins are intact. Cortical veins are unremarkable. Anatomic variants: None Delayed phase: Post-contrast images demonstrate no pathologic enhancement. Review of the MIP images confirms the above findings IMPRESSION: 1. High-grade stenosis of the mid left common carotid artery measuring greater than 75% relative to the more distal vessels. 2. Additional soft tissue plaque in the posterior wall of the proximal left internal carotid artery without focal stenosis. 3. Focal calcification in the posterior wall of the distal internal carotid artery bilaterally just below the skull base. This could be related to remote trauma. There is no lumen compromise associated. 4. No focal disease  at the ophthalmic segment on the left. 5. Scattered white matter hypoattenuation bilaterally likely reflects the sequela of chronic microvascular ischemia. 6. No acute cortical infarct. These results were called by telephone at the time of interpretation on 11/04/2017 at 3:58 pm to [facility], [place] , who verbally acknowledged these results. Electronically Signed   By: San Morelle M.D.   On: 11/04/2017 16:02   Ct Head Wo Contrast  Result Date: 11/04/2017 CLINICAL DATA:  New onset of vision loss in the left eye. The patient has no perception of light in the left eye. He began to have diminished vision in the left eye last evening. He awoke with no vision in the left eye today. EXAM: CT ANGIOGRAPHY HEAD AND NECK TECHNIQUE: Multidetector CT imaging of the head and neck was performed using the standard protocol during bolus administration of intravenous contrast. Multiplanar CT image reconstructions and MIPs were obtained to evaluate the vascular anatomy. Carotid stenosis measurements (when applicable) are obtained  utilizing NASCET criteria, using the distal internal carotid diameter as the denominator. CONTRAST:  165m ISOVUE-370 IOPAMIDOL (ISOVUE-370) INJECTION 76% COMPARISON:  None. FINDINGS: CT HEAD FINDINGS Brain: Noncontrast imaging the brain demonstrates scattered periventricular white matter hypoattenuation bilaterally. In age indeterminate lacunar infarct is present within the anterior limb of the left internal capsule. A ridging is white matter hypoattenuation is present in the inferior cerebellum bilaterally. No acute hemorrhage or mass lesion is present. The ventricles are of normal size. No significant extra-axial fluid collection is present. Vascular: No hyperdense vessel or unexpected calcification. Skull: Calvarium is intact. No focal lytic or blastic lesions are present. Sinuses: The paranasal sinuses and mastoid air cells are clear. Orbits: Bilateral globes and orbits are within normal limits. Review of the MIP images confirms the above findings CTA NECK FINDINGS Aortic arch: A 3 vessel arch configuration is present. Atherosclerotic calcifications are present without significant stenosis at the origins the great vessels. There is no aneurysm. Right carotid system: The right common carotid artery is within normal limits. Mild tortuosity is present in the proximal cervical right ICA. There is focal calcification along the posterior wall of the distal right ICA. No stenosis is present. Left carotid system: The left common carotid artery is within normal limits proximally. There is focal stenosis of the mid left common carotid artery. The lumen is narrowed to 1.6 mm. This compares with a more distal segment of 6.5 mm. Soft tissue plaque is noted along the posterior aspect of the left carotid bulb without significant luminal stenosis. Focal calcification is present along the posterior wall of the distal left internal carotid artery just below the skull base without significant stenosis. Vertebral arteries: The  vertebral arteries are codominant. Both vertebral arteries originate from the subclavian arteries without significant stenoses. There is no significant stenosis of either vertebral artery in the neck. Skeleton: Focal endplate changes are present with uncovertebral spurring at C6-7. Bilateral osseous foraminal narrowing is worse on the right. Vertebral body heights and alignment are otherwise normal. The patient is status post median sternotomy. An unerupted right maxillary and I tooth is present. An adjacent hypoplastic on are up to tooth is present. The patient is otherwise edentulous along the maxilla. Other neck: Soft tissue lipoma is present along the left occipital scalp. The soft tissues of the neck are unremarkable otherwise. Thyroid is within normal limits. Salivary glands are unremarkable. No significant adenopathy is present. Upper chest: Centrilobular emphysematous changes are present. No focal nodule or mass lesion is present. No significant  pleural effusion is present. Review of the MIP images confirms the above findings CTA HEAD FINDINGS Anterior circulation: The internal carotid arteries are within normal limits bilaterally from the skull base through the ICA termini. The right A1 segment is dominant. The anterior communicating artery is patent. MCA bifurcation is normal bilaterally. The ACA and MCA branch vessels are within normal limits. Posterior circulation: The vertebral arteries are codominant. Vertebrobasilar junction is normal. PICA origins are visualized and within normal limits. Both posterior cerebral arteries originate from the basilar tip. There is mild irregularity of distal branch vessels without a significant proximal stenosis or occlusion. Venous sinuses: The dural sinuses are patent. Right transverse sinus is dominant. The straight sinus and deep cerebral veins are intact. Cortical veins are unremarkable. Anatomic variants: None Delayed phase: Post-contrast images demonstrate no  pathologic enhancement. Review of the MIP images confirms the above findings IMPRESSION: 1. High-grade stenosis of the mid left common carotid artery measuring greater than 75% relative to the more distal vessels. 2. Additional soft tissue plaque in the posterior wall of the proximal left internal carotid artery without focal stenosis. 3. Focal calcification in the posterior wall of the distal internal carotid artery bilaterally just below the skull base. This could be related to remote trauma. There is no lumen compromise associated. 4. No focal disease at the ophthalmic segment on the left. 5. Scattered white matter hypoattenuation bilaterally likely reflects the sequela of chronic microvascular ischemia. 6. No acute cortical infarct. These results were called by telephone at the time of interpretation on 11/04/2017 at 3:58 pm to [facility], [place] , who verbally acknowledged these results. Electronically Signed   By: San Morelle M.D.   On: 11/04/2017 16:02   Ct Angio Neck W And/or Wo Contrast  Result Date: 11/04/2017 CLINICAL DATA:  New onset of vision loss in the left eye. The patient has no perception of light in the left eye. He began to have diminished vision in the left eye last evening. He awoke with no vision in the left eye today. EXAM: CT ANGIOGRAPHY HEAD AND NECK TECHNIQUE: Multidetector CT imaging of the head and neck was performed using the standard protocol during bolus administration of intravenous contrast. Multiplanar CT image reconstructions and MIPs were obtained to evaluate the vascular anatomy. Carotid stenosis measurements (when applicable) are obtained utilizing NASCET criteria, using the distal internal carotid diameter as the denominator. CONTRAST:  160m ISOVUE-370 IOPAMIDOL (ISOVUE-370) INJECTION 76% COMPARISON:  None. FINDINGS: CT HEAD FINDINGS Brain: Noncontrast imaging the brain demonstrates scattered periventricular white matter hypoattenuation bilaterally. In age  indeterminate lacunar infarct is present within the anterior limb of the left internal capsule. A ridging is white matter hypoattenuation is present in the inferior cerebellum bilaterally. No acute hemorrhage or mass lesion is present. The ventricles are of normal size. No significant extra-axial fluid collection is present. Vascular: No hyperdense vessel or unexpected calcification. Skull: Calvarium is intact. No focal lytic or blastic lesions are present. Sinuses: The paranasal sinuses and mastoid air cells are clear. Orbits: Bilateral globes and orbits are within normal limits. Review of the MIP images confirms the above findings CTA NECK FINDINGS Aortic arch: A 3 vessel arch configuration is present. Atherosclerotic calcifications are present without significant stenosis at the origins the great vessels. There is no aneurysm. Right carotid system: The right common carotid artery is within normal limits. Mild tortuosity is present in the proximal cervical right ICA. There is focal calcification along the posterior wall of the distal right ICA. No  stenosis is present. Left carotid system: The left common carotid artery is within normal limits proximally. There is focal stenosis of the mid left common carotid artery. The lumen is narrowed to 1.6 mm. This compares with a more distal segment of 6.5 mm. Soft tissue plaque is noted along the posterior aspect of the left carotid bulb without significant luminal stenosis. Focal calcification is present along the posterior wall of the distal left internal carotid artery just below the skull base without significant stenosis. Vertebral arteries: The vertebral arteries are codominant. Both vertebral arteries originate from the subclavian arteries without significant stenoses. There is no significant stenosis of either vertebral artery in the neck. Skeleton: Focal endplate changes are present with uncovertebral spurring at C6-7. Bilateral osseous foraminal narrowing is worse  on the right. Vertebral body heights and alignment are otherwise normal. The patient is status post median sternotomy. An unerupted right maxillary and I tooth is present. An adjacent hypoplastic on are up to tooth is present. The patient is otherwise edentulous along the maxilla. Other neck: Soft tissue lipoma is present along the left occipital scalp. The soft tissues of the neck are unremarkable otherwise. Thyroid is within normal limits. Salivary glands are unremarkable. No significant adenopathy is present. Upper chest: Centrilobular emphysematous changes are present. No focal nodule or mass lesion is present. No significant pleural effusion is present. Review of the MIP images confirms the above findings CTA HEAD FINDINGS Anterior circulation: The internal carotid arteries are within normal limits bilaterally from the skull base through the ICA termini. The right A1 segment is dominant. The anterior communicating artery is patent. MCA bifurcation is normal bilaterally. The ACA and MCA branch vessels are within normal limits. Posterior circulation: The vertebral arteries are codominant. Vertebrobasilar junction is normal. PICA origins are visualized and within normal limits. Both posterior cerebral arteries originate from the basilar tip. There is mild irregularity of distal branch vessels without a significant proximal stenosis or occlusion. Venous sinuses: The dural sinuses are patent. Right transverse sinus is dominant. The straight sinus and deep cerebral veins are intact. Cortical veins are unremarkable. Anatomic variants: None Delayed phase: Post-contrast images demonstrate no pathologic enhancement. Review of the MIP images confirms the above findings IMPRESSION: 1. High-grade stenosis of the mid left common carotid artery measuring greater than 75% relative to the more distal vessels. 2. Additional soft tissue plaque in the posterior wall of the proximal left internal carotid artery without focal  stenosis. 3. Focal calcification in the posterior wall of the distal internal carotid artery bilaterally just below the skull base. This could be related to remote trauma. There is no lumen compromise associated. 4. No focal disease at the ophthalmic segment on the left. 5. Scattered white matter hypoattenuation bilaterally likely reflects the sequela of chronic microvascular ischemia. 6. No acute cortical infarct. These results were called by telephone at the time of interpretation on 11/04/2017 at 3:58 pm to [facility], Bruce , who verbally acknowledged these results. Electronically Signed   By: San Morelle M.D.   On: 11/04/2017 16:02    EKG: Independently reviewed. Showed a Sinus Rhythm at a rate of 79 with diffuse ST Depressions in several Leads. 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  States that he was going on for 2 days and everything became black. -Currently denies any eye pain -Neurology consulted by EDP Dr. Lorraine Lax who recommended obtaining a CRP and a sed rate as well as getting a CTA of the head and neck -ESR was 45 and CRP is pending -CT of the head without contrast showed scattered periventricular white matter hypoattenuation bilaterally. In age indeterminate lacunar infarct is present within the anterior limb of the left internal capsule. A ridging is white matter  hypoattenuation is present in the inferior cerebellum bilaterally.No acute hemorrhage or mass lesion is present. The ventricles are of normal size. No significant extra-axial fluid collection is present. -CTA Head and Neck Showed High-grade stenosis of the mid left common carotid artery measuring greater than 75% relative to the more distal vessels. Additional soft tissue plaque in the posterior wall of the proximal left internal carotid artery without focal stenosis. Focal calcification in the posterior wall of the distal internal carotid artery bilaterally just below the skull base. This could be related to remote trauma. There is no lumen compromise associated. No focal disease at the ophthalmic segment on the left. Scattered white matter hypoattenuation bilaterally likely reflects the sequela of chronic microvascular ischemia. No acute cortical infarct. -This was discussed with Neurology Dr. Lorraine Lax who recommended transfer to [facility] and V[facility] Surgery consultation -I spoke to Dr. Bridgett Larsson of vascular surgery who states he will see the patient when he arrives to [facility] -Dr. Malen Gauze had initially recommended loading the patient with aspirin and Plavix however given his acute anemia and suspected GI bleed he recommended holding off for now until the source of bleeding is found -Continue with neurochecks per protocol -We will obtain PT/OT/SLP evaluations and keep the patient n.p.o. at this time -Further recommendations per Neurology and Vascular Surgery  Suspected Upper GIB likey from Gastric Ulcer/Duodenal Ulcer with previous Hx of Duodenal Ulcer Perforation and Hx of GIB  -Presents with a 2-3 week Hx of Coffee Ground Emesis and Melena -See below -Have consulted Gastroenterology Dr. Wilford Corner will see the patient in consultation -Currently we will keep the patient n.p.o. at this time  Symptomatic Anemia/Acute Blood Anemia 2/2 to suspected GIB -Patient presented with weakness, shortness of  breath specifically with ambulation, as well as midepigastric abdominal pain.  Patient states he is also loss of appetite -FOBT was negative but however patient endorses 2 to 3-week history of coffee-ground emesis and melanotic stools which he states is now resolved -Patient's hemoglobin/hematocrit on admission was 4.7/15.4 and upon repeat was 4.3/14.4  -Check anemia panel -Type and Screen; Patient is getting transfused 2 units of PRBCs that were ordered by EDP -We will start IV fluid hydration at 75 mL's per hour with 20 mEq of potassium chloride -Continue to monitor H&H is very carefully -Discussed case with [facility] Gastroenterology Dr. Michail Sermon who will see the patient in consultation  Hypokalemia -Patient's potassium level on admission was 2.9 -Given 40 mEq of p.o. potassium chloride in the ED and will give an additional 40 mEq -Continue to monitor and replete as necessary -Repeat CMP in a.m.  Polysubstance Abuse including Cocaine  and THC -Patient admits to me that he did cocaine last week and did marijuana last night trying to increase his appetite -Urine drug screen positive for cocaine as well as THC -Counseling given  Tobacco Abuse -Smoking cessation counseling given -Continue with nicotine patch 14 mg transdermally every 24 hours  EtOH Abuse/Concern for EtOH Withdrawal -Patient states last drink was 2 to 3 days ago -Check Alcohol level -Placed on SDU CIWA protocol with IV Lorazepam -Continue with folic acid, multivitamin, thiamine  HLD -Check Lipid panel in the AM. -Continue with atorvastatin 80 mg p.o. nightly  HTN -BP on the Softer Side  -Hold Home Antihypertensives including HCTZ 12.5 mg po Daily  -Continue to Monitor BP  Hx of MI/CAD s/p CABG -Patient has had a quadruple bypass in 2015 and history of MI -Takes aspirin 325 mg p.o. daily as well as atorvastatin 80 mg nightly -We will hold aspirin 325 mg currently given his recent GI bleeding  GERD  -The patient  on IV Protonix 40 mg every 12 hours  COPD -Not currently in exacerbation is not currently wheezing. -Continue with Brio Ellipta 1 puff inhalations daily -We will hold patient's albuterol inhaler and start the patient on albuterol nebulized every 6 PRN for wheezing and shortness of breath -Smoking cessation counseling given we will continue NicoDerm patch   DVT prophylaxis: SCDs Code Status: FULL CODE Family Communication: No family present at bedside Disposition Plan: Anticipate D/C to Home environment when medically stable Consults called: Neurology, Gastroenterology,  Admission status: Inpatient SDU at [facility]   Severity of Illness: The appropriate patient status for this patient is INPATIENT. Inpatient status is judged to be reasonable and necessary in order to provide the required intensity of service to ensure the patient's safety. The patient's presenting symptoms, physical exam findings, and initial radiographic and laboratory data in the context of their chronic comorbidities is felt to place them at high risk for further clinical deterioration. Furthermore, it is not anticipated that the patient will be medically stable for discharge from the hospital within 2 midnights of admission. The following factors support the patient status of inpatient.   " The patient's presenting symptoms include left eye vision loss and abdominal pain associated with dark stools and coffee-ground emesis. " The worrisome physical exam findings include vision loss and abdominal tenderness on palpation. " The initial radiographic and laboratory data are worrisome because of high-grade stenosis noted. " The chronic co-morbidities include hypertension, hyperlipidemia, COPD, history of GI bleed and ulcerations, history of CAD with CABG, history of cocaine abuse and polysubstance abuse.  * I certify that at the point of admission it is my clinical judgment that the patient will require inpatient hospital care  spanning beyond 2 midnights from the point of admission due to high intensity of service, high risk for further deterioration and high frequency of surveillance required.Kerney Elbe, D.O. [facility] Pager [phone]  If 7PM-7AM, please contact night-coverage [email or web address] Password [facility]  11/04/2017, 6:31 PM

## 2017-11-04 NOTE — ED Notes (Signed)
Pt aware urine sample needed, urinal provided 

## 2017-11-04 NOTE — ED Provider Notes (Addendum)
[facility] DEPT Provider Note   CSN: [id number] Arrival date & time: 11/04/17  1205     History   Chief Complaint Chief Complaint  Patient presents with  . Eye Problem    HPI [name] is a 62 y.o. male.  HPI [name] is a 62 y.o. male with hx of CAD, COPD, cocaine abuse, MI, presents to ED with complaint of vision loss. Pt denies prior vision problems. States that he felt like vision may be slightly blurred yesterday in left eye, however today when woke up he could not see out of his left eye. States "eveyrhting is black."  He denies any pain.  He denies any injuries.  He denies any history of the same.  No treatment prior to coming in. Does not wear glasses or contacts.   Past Medical History:  Diagnosis Date  . Anginal pain ([place])   . CAD (coronary artery disease)    4V CABG 10/31/13  . Cocaine abuse ([place])   . COPD (chronic obstructive pulmonary disease) ([place])   . GERD (gastroesophageal reflux disease)   . H/O: eczema    lower extremities  . H/O: GI bleed    a. 62 y/o ago, r/t ulcer. Denies recent GIB.  Marland Kitchen High cholesterol   . Hypertension   . Myocardial infarction ([place])   . Perforation of duodenal ulcer ([place])    a. 1970s - s/p surgery.   . Shortness of breath dyspnea   . Tobacco abuse     Patient Active Problem List   Diagnosis Date Noted  . Tobacco dependence 05/30/2016  . Acute respiratory failure with hypercapnia ([place]) 07/02/2015  . Cocaine abuse ([place]) 07/02/2015  . Chronic obstructive pulmonary disease ([place])   . Essential hypertension   . Acute encephalopathy   . Cardiomyopathy, ischemic 09/04/2014  . Protein-calorie malnutrition, severe ([place]) 09/02/2014  . COPD exacerbation ([place]) 09/01/2014  . Acute respiratory failure ([place]) 09/01/2014  . HTN (hypertension) 09/01/2014  . S/P CABG x 4 10/31/2013  . NSTEMI (non-ST elevated myocardial infarction) ([place]) 10/27/2013  . Pure hypercholesterolemia 10/27/2013  . Acute  coronary syndrome ([place]) 10/26/2013  . Influenza A 05/19/2011  . Tobacco abuse 05/19/2011  . Acute bronchitis with chronic obstructive pulmonary disease (COPD) ([place]) 05/18/2011  . PUD (peptic ulcer disease) 05/18/2011    Past Surgical History:  Procedure Laterality Date  . CORONARY ARTERY BYPASS GRAFT N/A 10/31/2013   Procedure: CORONARY ARTERY BYPASS GRAFTING TIMES FOUR ON PUMP USING LEFT INTERNAL MAMMARY ARTERY AND RIGHT GREATER SAPHENOUS VEIN VIA ENDOVEIN HARVEST.;  Surgeon: Melrose Nakayama, MD;  Location: [facility];  Service: Open Heart Surgery;  Laterality: N/A;  . INTRAOPERATIVE TRANSESOPHAGEAL ECHOCARDIOGRAM N/A 10/31/2013   Procedure: INTRAOPERATIVE TRANSESOPHAGEAL ECHOCARDIOGRAM;  Surgeon: Melrose Nakayama, MD;  Location: [facility];  Service: Open Heart Surgery;  Laterality: N/A;  . LEFT HEART CATHETERIZATION WITH CORONARY ANGIOGRAM N/A 10/28/2013   Procedure: LEFT HEART CATHETERIZATION WITH CORONARY ANGIOGRAM;  Surgeon: Burnell Blanks, MD;  Location: [facility] CATH LAB;  Service: Cardiovascular;  Laterality: N/A;  . stomach sx          Home Medications    Prior to Admission medications   Medication Sig Start Date End Date Taking? Authorizing Provider  albuterol (PROVENTIL HFA;VENTOLIN HFA) 108 (90 Base) MCG/ACT inhaler Inhale 2 puffs into the lungs every 6 (six) hours as needed for wheezing or shortness of breath. Patient not taking: Reported on 06/28/2017 01/27/17   Tresa Garter, MD  aspirin 325 MG EC tablet Take 1 tablet (325 mg total) by mouth daily. 11/19/15   Dorena Dew, FNP  atorvastatin (LIPITOR) 80 MG tablet TAKE 1 TABLET BY MOUTH DAILY AT 6 PM. 08/30/17   Dorena Dew, FNP  budesonide-formoterol [facility]) 160-4.5 MCG/ACT inhaler Inhale 2 puffs into the lungs 2 (two) times daily. Patient not taking: Reported on 06/28/2017 11/28/16   Dorena Dew, FNP  fluticasone furoate-vilanterol (BREO ELLIPTA) 200-25 MCG/INH AEPB Inhale 1 puff into the lungs  daily. 01/27/17   Tresa Garter, MD  hydrochlorothiazide (MICROZIDE) 12.5 MG capsule TAKE 1 CAPSULE BY MOUTH DAILY. 01/13/17   Dorena Dew, FNP  hydrochlorothiazide (MICROZIDE) 12.5 MG capsule TAKE 1 CAPSULE BY MOUTH DAILY. 08/30/17   Dorena Dew, FNP  hydrochlorothiazide (MICROZIDE) 12.5 MG capsule TAKE 1 CAPSULE BY MOUTH DAILY. 08/30/17   Dorena Dew, FNP  nicotine (NICODERM CQ) 14 mg/24hr patch Place 1 patch (14 mg total) onto the skin daily. Patient not taking: Reported on 06/28/2017 11/28/16   Dorena Dew, FNP  SYMBICORT 160-4.5 MCG/ACT inhaler INHALE 2 PUFFS INTO THE LUNGS 2 TIMES DAILY. 03/16/17   Dorena Dew, FNP  triamcinolone (KENALOG) 0.025 % ointment APPLY 1 APPLICATION TOPICALLY 2 TIMES DAILY. 10/20/16   Dorena Dew, FNP  VENTOLIN HFA 108 (90 Base) MCG/ACT inhaler INHALE 2 PUFFS INTO THE LUNGS EVERY 6 HOURS AS NEEDED FOR WHEEZING OR SHORTNESS OF BREATH. 10/10/17   Dorena Dew, FNP    Family History Family History  Problem Relation Age of Onset  . Lung cancer Unknown     Social History Social History   Tobacco Use  . Smoking status: Current Every Day Smoker    Packs/day: 0.50    Years: 40.00    Pack years: 20.00    Types: Cigarettes  . Smokeless tobacco: Never Used  Substance Use Topics  . Alcohol use: Yes    Alcohol/week: 1.2 oz    Types: 2 Cans of beer per week    Comment: 6 pack over the weekend   . Drug use: Yes    Types: Marijuana, Cocaine    Comment: "about 1 marijuana cigarette a week"     Allergies   Patient has no known allergies.   Review of Systems Review of Systems  Constitutional: Negative for chills and fever.  Eyes: Positive for visual disturbance. Negative for photophobia, pain, discharge, redness and itching.  Respiratory: Negative for cough, chest tightness and shortness of breath.   Cardiovascular: Negative for chest pain, palpitations and leg swelling.  Gastrointestinal: Negative for abdominal  distention, abdominal pain, diarrhea, nausea and vomiting.  Genitourinary: Negative for dysuria, frequency, hematuria and urgency.  Musculoskeletal: Negative for arthralgias, myalgias, neck pain and neck stiffness.  Skin: Negative for rash.  Allergic/Immunologic: Negative for immunocompromised state.  Neurological: Negative for dizziness, weakness, light-headedness, numbness and headaches.  All other systems reviewed and are negative.    Physical Exam Updated Vital Signs BP 115/61 (BP Location: Right Arm)   Pulse 92   Temp 98.5 F (36.9 C) (Oral)   Resp 16   Ht 6\' 1"  (1.854 m)   Wt 88.5 kg (195 lb)   SpO2 100%   BMI 25.73 kg/m   Physical Exam  Constitutional: He is oriented to person, place, and time. He appears well-developed and well-nourished. No distress.  HENT:  Head: Normocephalic and atraumatic.  Eyes: Conjunctivae are normal.  Right pupil equal, reactive to light and accomodation. Left pupillary dilation present  with light to left eye.   Neck: Neck supple.  Cardiovascular: Normal rate, regular rhythm and normal heart sounds.  Pulmonary/Chest: Effort normal. No respiratory distress. He has no wheezes. He has no rales.  Abdominal: Soft. Bowel sounds are normal. He exhibits no distension. There is no tenderness. There is no rebound.  Musculoskeletal: He exhibits no edema.  Neurological: He is alert and oriented to person, place, and time.  5/5 and equal upper and lower extremity strength bilaterally. Equal grip strength bilaterally. Normal finger to nose and heel to shin. No pronator drift. Patellar reflexes 2+. Gait is normal.    Skin: Skin is warm and dry.  Nursing note and vitals reviewed.    ED Treatments / Results  Labs (all labs ordered are listed, but only abnormal results are displayed) Labs Reviewed  CBC WITH DIFFERENTIAL/PLATELET - Abnormal; Notable for the following components:      Result Value   RBC 2.07 (*)    Hemoglobin 4.7 (*)    HCT 15.4 (*)     MCV 74.4 (*)    MCH 22.7 (*)    RDW 21.6 (*)    All other components within normal limits  BASIC METABOLIC PANEL - Abnormal; Notable for the following components:   Potassium 2.9 (*)    Calcium 8.5 (*)    All other components within normal limits  SEDIMENTATION RATE - Abnormal; Notable for the following components:   Sed Rate 45 (*)    All other components within normal limits  HEMOGLOBIN AND HEMATOCRIT, BLOOD - Abnormal; Notable for the following components:   Hemoglobin 4.3 (*)    HCT 14.4 (*)    All other components within normal limits  PROTIME-INR  RAPID URINE DRUG SCREEN, HOSP PERFORMED  C-REACTIVE PROTEIN  VITAMIN B12  FOLATE  IRON AND TIBC  FERRITIN  RETICULOCYTES  URINALYSIS, ROUTINE W REFLEX MICROSCOPIC  POC OCCULT BLOOD, ED  I-STAT TROPONIN, ED  SAMPLE TO BLOOD BANK  TYPE AND SCREEN  ABO/RH  PREPARE RBC (CROSSMATCH)    EKG None  Radiology Ct Angio Head W Or Wo Contrast  Result Date: 11/04/2017 CLINICAL DATA:  New onset of vision loss in the left eye. The patient has no perception of light in the left eye. He began to have diminished vision in the left eye last evening. He awoke with no vision in the left eye today. EXAM: CT ANGIOGRAPHY HEAD AND NECK TECHNIQUE: Multidetector CT imaging of the head and neck was performed using the standard protocol during bolus administration of intravenous contrast. Multiplanar CT image reconstructions and MIPs were obtained to evaluate the vascular anatomy. Carotid stenosis measurements (when applicable) are obtained utilizing NASCET criteria, using the distal internal carotid diameter as the denominator. CONTRAST:  157mL ISOVUE-370 IOPAMIDOL (ISOVUE-370) INJECTION 76% COMPARISON:  None. FINDINGS: CT HEAD FINDINGS Brain: Noncontrast imaging the brain demonstrates scattered periventricular white matter hypoattenuation bilaterally. In age indeterminate lacunar infarct is present within the anterior limb of the left internal capsule. A  ridging is white matter hypoattenuation is present in the inferior cerebellum bilaterally. No acute hemorrhage or mass lesion is present. The ventricles are of normal size. No significant extra-axial fluid collection is present. Vascular: No hyperdense vessel or unexpected calcification. Skull: Calvarium is intact. No focal lytic or blastic lesions are present. Sinuses: The paranasal sinuses and mastoid air cells are clear. Orbits: Bilateral globes and orbits are within normal limits. Review of the MIP images confirms the above findings CTA NECK FINDINGS Aortic arch: A 3  vessel arch configuration is present. Atherosclerotic calcifications are present without significant stenosis at the origins the great vessels. There is no aneurysm. Right carotid system: The right common carotid artery is within normal limits. Mild tortuosity is present in the proximal cervical right ICA. There is focal calcification along the posterior wall of the distal right ICA. No stenosis is present. Left carotid system: The left common carotid artery is within normal limits proximally. There is focal stenosis of the mid left common carotid artery. The lumen is narrowed to 1.6 mm. This compares with a more distal segment of 6.5 mm. Soft tissue plaque is noted along the posterior aspect of the left carotid bulb without significant luminal stenosis. Focal calcification is present along the posterior wall of the distal left internal carotid artery just below the skull base without significant stenosis. Vertebral arteries: The vertebral arteries are codominant. Both vertebral arteries originate from the subclavian arteries without significant stenoses. There is no significant stenosis of either vertebral artery in the neck. Skeleton: Focal endplate changes are present with uncovertebral spurring at C6-7. Bilateral osseous foraminal narrowing is worse on the right. Vertebral body heights and alignment are otherwise normal. The patient is status  post median sternotomy. An unerupted right maxillary and I tooth is present. An adjacent hypoplastic on are up to tooth is present. The patient is otherwise edentulous along the maxilla. Other neck: Soft tissue lipoma is present along the left occipital scalp. The soft tissues of the neck are unremarkable otherwise. Thyroid is within normal limits. Salivary glands are unremarkable. No significant adenopathy is present. Upper chest: Centrilobular emphysematous changes are present. No focal nodule or mass lesion is present. No significant pleural effusion is present. Review of the MIP images confirms the above findings CTA HEAD FINDINGS Anterior circulation: The internal carotid arteries are within normal limits bilaterally from the skull base through the ICA termini. The right A1 segment is dominant. The anterior communicating artery is patent. MCA bifurcation is normal bilaterally. The ACA and MCA branch vessels are within normal limits. Posterior circulation: The vertebral arteries are codominant. Vertebrobasilar junction is normal. PICA origins are visualized and within normal limits. Both posterior cerebral arteries originate from the basilar tip. There is mild irregularity of distal branch vessels without a significant proximal stenosis or occlusion. Venous sinuses: The dural sinuses are patent. Right transverse sinus is dominant. The straight sinus and deep cerebral veins are intact. Cortical veins are unremarkable. Anatomic variants: None Delayed phase: Post-contrast images demonstrate no pathologic enhancement. Review of the MIP images confirms the above findings IMPRESSION: 1. High-grade stenosis of the mid left common carotid artery measuring greater than 75% relative to the more distal vessels. 2. Additional soft tissue plaque in the posterior wall of the proximal left internal carotid artery without focal stenosis. 3. Focal calcification in the posterior wall of the distal internal carotid artery  bilaterally just below the skull base. This could be related to remote trauma. There is no lumen compromise associated. 4. No focal disease at the ophthalmic segment on the left. 5. Scattered white matter hypoattenuation bilaterally likely reflects the sequela of chronic microvascular ischemia. 6. No acute cortical infarct. These results were called by telephone at the time of interpretation on 11/04/2017 at 3:58 pm to [facility], [place] , who verbally acknowledged these results. Electronically Signed   By: San Morelle M.D.   On: 11/04/2017 16:02   Ct Head Wo Contrast  Result Date: 11/04/2017 CLINICAL DATA:  New onset of vision loss in  the left eye. The patient has no perception of light in the left eye. He began to have diminished vision in the left eye last evening. He awoke with no vision in the left eye today. EXAM: CT ANGIOGRAPHY HEAD AND NECK TECHNIQUE: Multidetector CT imaging of the head and neck was performed using the standard protocol during bolus administration of intravenous contrast. Multiplanar CT image reconstructions and MIPs were obtained to evaluate the vascular anatomy. Carotid stenosis measurements (when applicable) are obtained utilizing NASCET criteria, using the distal internal carotid diameter as the denominator. CONTRAST:  180mL ISOVUE-370 IOPAMIDOL (ISOVUE-370) INJECTION 76% COMPARISON:  None. FINDINGS: CT HEAD FINDINGS Brain: Noncontrast imaging the brain demonstrates scattered periventricular white matter hypoattenuation bilaterally. In age indeterminate lacunar infarct is present within the anterior limb of the left internal capsule. A ridging is white matter hypoattenuation is present in the inferior cerebellum bilaterally. No acute hemorrhage or mass lesion is present. The ventricles are of normal size. No significant extra-axial fluid collection is present. Vascular: No hyperdense vessel or unexpected calcification. Skull: Calvarium is intact. No focal lytic or  blastic lesions are present. Sinuses: The paranasal sinuses and mastoid air cells are clear. Orbits: Bilateral globes and orbits are within normal limits. Review of the MIP images confirms the above findings CTA NECK FINDINGS Aortic arch: A 3 vessel arch configuration is present. Atherosclerotic calcifications are present without significant stenosis at the origins the great vessels. There is no aneurysm. Right carotid system: The right common carotid artery is within normal limits. Mild tortuosity is present in the proximal cervical right ICA. There is focal calcification along the posterior wall of the distal right ICA. No stenosis is present. Left carotid system: The left common carotid artery is within normal limits proximally. There is focal stenosis of the mid left common carotid artery. The lumen is narrowed to 1.6 mm. This compares with a more distal segment of 6.5 mm. Soft tissue plaque is noted along the posterior aspect of the left carotid bulb without significant luminal stenosis. Focal calcification is present along the posterior wall of the distal left internal carotid artery just below the skull base without significant stenosis. Vertebral arteries: The vertebral arteries are codominant. Both vertebral arteries originate from the subclavian arteries without significant stenoses. There is no significant stenosis of either vertebral artery in the neck. Skeleton: Focal endplate changes are present with uncovertebral spurring at C6-7. Bilateral osseous foraminal narrowing is worse on the right. Vertebral body heights and alignment are otherwise normal. The patient is status post median sternotomy. An unerupted right maxillary and I tooth is present. An adjacent hypoplastic on are up to tooth is present. The patient is otherwise edentulous along the maxilla. Other neck: Soft tissue lipoma is present along the left occipital scalp. The soft tissues of the neck are unremarkable otherwise. Thyroid is within  normal limits. Salivary glands are unremarkable. No significant adenopathy is present. Upper chest: Centrilobular emphysematous changes are present. No focal nodule or mass lesion is present. No significant pleural effusion is present. Review of the MIP images confirms the above findings CTA HEAD FINDINGS Anterior circulation: The internal carotid arteries are within normal limits bilaterally from the skull base through the ICA termini. The right A1 segment is dominant. The anterior communicating artery is patent. MCA bifurcation is normal bilaterally. The ACA and MCA branch vessels are within normal limits. Posterior circulation: The vertebral arteries are codominant. Vertebrobasilar junction is normal. PICA origins are visualized and within normal limits. Both posterior cerebral arteries  originate from the basilar tip. There is mild irregularity of distal branch vessels without a significant proximal stenosis or occlusion. Venous sinuses: The dural sinuses are patent. Right transverse sinus is dominant. The straight sinus and deep cerebral veins are intact. Cortical veins are unremarkable. Anatomic variants: None Delayed phase: Post-contrast images demonstrate no pathologic enhancement. Review of the MIP images confirms the above findings IMPRESSION: 1. High-grade stenosis of the mid left common carotid artery measuring greater than 75% relative to the more distal vessels. 2. Additional soft tissue plaque in the posterior wall of the proximal left internal carotid artery without focal stenosis. 3. Focal calcification in the posterior wall of the distal internal carotid artery bilaterally just below the skull base. This could be related to remote trauma. There is no lumen compromise associated. 4. No focal disease at the ophthalmic segment on the left. 5. Scattered white matter hypoattenuation bilaterally likely reflects the sequela of chronic microvascular ischemia. 6. No acute cortical infarct. These results were  called by telephone at the time of interpretation on 11/04/2017 at 3:58 pm to [facility], [place] , who verbally acknowledged these results. Electronically Signed   By: San Morelle M.D.   On: 11/04/2017 16:02   Ct Angio Neck W And/or Wo Contrast  Result Date: 11/04/2017 CLINICAL DATA:  New onset of vision loss in the left eye. The patient has no perception of light in the left eye. He began to have diminished vision in the left eye last evening. He awoke with no vision in the left eye today. EXAM: CT ANGIOGRAPHY HEAD AND NECK TECHNIQUE: Multidetector CT imaging of the head and neck was performed using the standard protocol during bolus administration of intravenous contrast. Multiplanar CT image reconstructions and MIPs were obtained to evaluate the vascular anatomy. Carotid stenosis measurements (when applicable) are obtained utilizing NASCET criteria, using the distal internal carotid diameter as the denominator. CONTRAST:  155mL ISOVUE-370 IOPAMIDOL (ISOVUE-370) INJECTION 76% COMPARISON:  None. FINDINGS: CT HEAD FINDINGS Brain: Noncontrast imaging the brain demonstrates scattered periventricular white matter hypoattenuation bilaterally. In age indeterminate lacunar infarct is present within the anterior limb of the left internal capsule. A ridging is white matter hypoattenuation is present in the inferior cerebellum bilaterally. No acute hemorrhage or mass lesion is present. The ventricles are of normal size. No significant extra-axial fluid collection is present. Vascular: No hyperdense vessel or unexpected calcification. Skull: Calvarium is intact. No focal lytic or blastic lesions are present. Sinuses: The paranasal sinuses and mastoid air cells are clear. Orbits: Bilateral globes and orbits are within normal limits. Review of the MIP images confirms the above findings CTA NECK FINDINGS Aortic arch: A 3 vessel arch configuration is present. Atherosclerotic calcifications are present without  significant stenosis at the origins the great vessels. There is no aneurysm. Right carotid system: The right common carotid artery is within normal limits. Mild tortuosity is present in the proximal cervical right ICA. There is focal calcification along the posterior wall of the distal right ICA. No stenosis is present. Left carotid system: The left common carotid artery is within normal limits proximally. There is focal stenosis of the mid left common carotid artery. The lumen is narrowed to 1.6 mm. This compares with a more distal segment of 6.5 mm. Soft tissue plaque is noted along the posterior aspect of the left carotid bulb without significant luminal stenosis. Focal calcification is present along the posterior wall of the distal left internal carotid artery just below the skull base without significant stenosis.  Vertebral arteries: The vertebral arteries are codominant. Both vertebral arteries originate from the subclavian arteries without significant stenoses. There is no significant stenosis of either vertebral artery in the neck. Skeleton: Focal endplate changes are present with uncovertebral spurring at C6-7. Bilateral osseous foraminal narrowing is worse on the right. Vertebral body heights and alignment are otherwise normal. The patient is status post median sternotomy. An unerupted right maxillary and I tooth is present. An adjacent hypoplastic on are up to tooth is present. The patient is otherwise edentulous along the maxilla. Other neck: Soft tissue lipoma is present along the left occipital scalp. The soft tissues of the neck are unremarkable otherwise. Thyroid is within normal limits. Salivary glands are unremarkable. No significant adenopathy is present. Upper chest: Centrilobular emphysematous changes are present. No focal nodule or mass lesion is present. No significant pleural effusion is present. Review of the MIP images confirms the above findings CTA HEAD FINDINGS Anterior circulation: The  internal carotid arteries are within normal limits bilaterally from the skull base through the ICA termini. The right A1 segment is dominant. The anterior communicating artery is patent. MCA bifurcation is normal bilaterally. The ACA and MCA branch vessels are within normal limits. Posterior circulation: The vertebral arteries are codominant. Vertebrobasilar junction is normal. PICA origins are visualized and within normal limits. Both posterior cerebral arteries originate from the basilar tip. There is mild irregularity of distal branch vessels without a significant proximal stenosis or occlusion. Venous sinuses: The dural sinuses are patent. Right transverse sinus is dominant. The straight sinus and deep cerebral veins are intact. Cortical veins are unremarkable. Anatomic variants: None Delayed phase: Post-contrast images demonstrate no pathologic enhancement. Review of the MIP images confirms the above findings IMPRESSION: 1. High-grade stenosis of the mid left common carotid artery measuring greater than 75% relative to the more distal vessels. 2. Additional soft tissue plaque in the posterior wall of the proximal left internal carotid artery without focal stenosis. 3. Focal calcification in the posterior wall of the distal internal carotid artery bilaterally just below the skull base. This could be related to remote trauma. There is no lumen compromise associated. 4. No focal disease at the ophthalmic segment on the left. 5. Scattered white matter hypoattenuation bilaterally likely reflects the sequela of chronic microvascular ischemia. 6. No acute cortical infarct. These results were called by telephone at the time of interpretation on 11/04/2017 at 3:58 pm to [facility], [place] , who verbally acknowledged these results. Electronically Signed   By: San Morelle M.D.   On: 11/04/2017 16:02    Procedures Procedures (including critical care time) CRITICAL CARE Performed by: Howard Bunte Total critical care time: 30 minutes Critical care time was exclusive of separately billable procedures and treating other patients. Critical care was necessary to treat or prevent imminent or life-threatening deterioration. Critical care was time spent personally by me on the following activities: development of treatment plan with patient and/or surrogate as well as nursing, discussions with consultants, evaluation of patient's response to treatment, examination of patient, obtaining history from patient or surrogate, ordering and performing treatments and interventions, ordering and review of laboratory studies, ordering and review of radiographic studies, pulse oximetry and re-evaluation of patient's condition.  Medications Ordered in ED Medications  iopamidol (ISOVUE-370) 76 % injection (has no administration in time range)  0.9 %  sodium chloride infusion (has no administration in time range)  potassium chloride SA (K-DUR,KLOR-CON) CR tablet 40 mEq (40 mEq Oral Given 11/04/17 1501)  iopamidol (ISOVUE-370)  76 % injection 100 mL (100 mLs Intravenous Contrast Given 11/04/17 1520)     Initial Impression / Assessment and Plan / ED Course  I have reviewed the triage vital signs and the nursing notes.  Pertinent labs & imaging results that were available during my care of the patient were reviewed by me and considered in my medical decision making (see chart for details).     Pt with onset of left eye blurred vision, last was normal last night. No eye pain. Exam and hx concerning for central retinal artery occlusion.  Discussed case with Dr. Cathleen Fears who has seen patient agrees.  A will page neurology and get labs and CT head.   Spoke with Dr. Malen Gauze with neurology who recommended also obtaining CRP and sed rate, as well as CTA head and neck.  Hgb 4.3 repeated. Hemoccult negative, although pt does have hx of peptic ulcers and some recent epigastric pain. Stool brown on my exam.  Type and screen and transfusion ordered.    4:38 PM CTA/CT head showing high grade stenosis in mid left common carotid artery greater than 75%.  Additional plaques seen in posterior wall of proximal left internal carotid artery without stenosis. Spoke with neurology, will need vascular consult and admission. Unable to be started on plavix due to hgb of 4. Will transfer to [facility].   4:56 PM  Spoke with hospitalist, will admit.   Vitals:   11/04/17 1211 11/04/17 1404 11/04/17 1555 11/04/17 1600  BP: 115/61 116/73 (!) 104/57 107/71  Pulse: 92 72 69 69  Resp: 16 14 14 15   Temp: 98.5 F (36.9 C)     TempSrc: Oral     SpO2: 100% 100% 100% 100%  Weight: 88.5 kg (195 lb)     Height: 6\' 1"  (1.854 m)         Final Clinical Impressions(s) / ED Diagnoses   Final diagnoses:  Retinal artery occlusion  Stenosis of left carotid artery  Anemia, unspecified type    ED Discharge Orders    None       Jeannett Senior, PA-C 11/04/17 1701    Orlie Dakin, MD 11/05/17 0701    Jeannett Senior, PA-C 11/28/17 1459    Orlie Dakin, MD 12/04/17 [phone]

## 2017-11-04 NOTE — ED Notes (Signed)
Date and time results received: 11/04/17 1442 (use smartphrase ".now" to insert current time)  Test: Hgb Critical Value: 4.7  Name of Provider Notified: Vilma Prader.  Orders Received? Or Actions Taken?:

## 2017-11-04 NOTE — Progress Notes (Signed)
   Daily Progress Note   Case discussed with Dr. Alfredia Ferguson.  CTA neck consistent with proximal L CCA stenosis and soft plaque in L ICA.  Chart briefly reviewed.    Lab Results  Component Value Date   WBC 6.6 11/04/2017   HGB 4.3 (LL) 11/04/2017   HCT 14.4 (L) 11/04/2017   MCV 74.4 (L) 11/04/2017   PLT 339 11/04/2017    Obviously the severe anemia will be the most pressing issue.  Will see patient once medical stable tomorrow   Adele Barthel, MD, FACS [facility] Office: [phone] Pager: [phone]  11/04/2017, 6:25 PM

## 2017-11-05 ENCOUNTER — Inpatient Hospital Stay (HOSPITAL_COMMUNITY): Payer: Self-pay | Admitting: Anesthesiology

## 2017-11-05 ENCOUNTER — Encounter (HOSPITAL_COMMUNITY)
Admission: EM | Disposition: A | Discharge status: Home / Self Care | Payer: Self-pay | Source: Home / Self Care | Attending: Internal Medicine

## 2017-11-05 ENCOUNTER — Inpatient Hospital Stay (HOSPITAL_COMMUNITY): Payer: Self-pay

## 2017-11-05 ENCOUNTER — Other Ambulatory Visit: Payer: Self-pay

## 2017-11-05 ENCOUNTER — Encounter (HOSPITAL_COMMUNITY): Payer: Self-pay | Admitting: *Deleted

## 2017-11-05 DIAGNOSIS — I6522 Occlusion and stenosis of left carotid artery: Secondary | ICD-10-CM

## 2017-11-05 DIAGNOSIS — D649 Anemia, unspecified: Secondary | ICD-10-CM

## 2017-11-05 HISTORY — PX: BIOPSY: SHX5522

## 2017-11-05 HISTORY — PX: ESOPHAGOGASTRODUODENOSCOPY (EGD) WITH PROPOFOL: SHX5813

## 2017-11-05 LAB — RETICULOCYTES
RBC.: 1.93 MIL/uL — ABNORMAL LOW (ref 4.22–5.81)
RETIC CT PCT: 2.2 % (ref 0.4–3.1)
Retic Count, Absolute: 0 10*3/uL — ABNORMAL LOW (ref 19.0–186.0)

## 2017-11-05 LAB — URINALYSIS, ROUTINE W REFLEX MICROSCOPIC
Bilirubin Urine: NEGATIVE
Glucose, UA: NEGATIVE mg/dL
Hgb urine dipstick: NEGATIVE
KETONES UR: NEGATIVE mg/dL
Leukocytes, UA: NEGATIVE
NITRITE: NEGATIVE
PH: 7 (ref 5.0–8.0)
PROTEIN: NEGATIVE mg/dL
Specific Gravity, Urine: 1.023 (ref 1.005–1.030)

## 2017-11-05 LAB — LIPID PANEL
CHOL/HDL RATIO: 2.2 ratio
Cholesterol: 97 mg/dL (ref 0–200)
HDL: 44 mg/dL (ref >40)
LDL Cholesterol: 42 mg/dL (ref 0–99)
Triglycerides: 57 mg/dL (ref <150)
VLDL: 11 mg/dL (ref 0–40)

## 2017-11-05 LAB — CBC
HEMATOCRIT: 23 % — AB (ref 39.0–52.0)
HEMOGLOBIN: 7.2 g/dL — AB (ref 13.0–17.0)
MCH: 24.9 pg — AB (ref 26.0–34.0)
MCHC: 31.3 g/dL (ref 30.0–36.0)
MCV: 79.6 fL (ref 78.0–100.0)
Platelets: 305 10*3/uL (ref 150–400)
RBC: 2.89 MIL/uL — AB (ref 4.22–5.81)
RDW: 20.4 % — ABNORMAL HIGH (ref 11.5–15.5)
WBC: 7.1 10*3/uL (ref 4.0–10.5)

## 2017-11-05 LAB — COMPREHENSIVE METABOLIC PANEL
ALT: 11 U/L — ABNORMAL LOW (ref 17–63)
ANION GAP: 9 (ref 5–15)
AST: 16 U/L (ref 15–41)
Albumin: 3.2 g/dL — ABNORMAL LOW (ref 3.5–5.0)
Alkaline Phosphatase: 113 U/L (ref 38–126)
BUN: 7 mg/dL (ref 6–20)
CHLORIDE: 104 mmol/L (ref 101–111)
CO2: 22 mmol/L (ref 22–32)
Calcium: 8.4 mg/dL — ABNORMAL LOW (ref 8.9–10.3)
Creatinine, Ser: 0.95 mg/dL (ref 0.61–1.24)
GFR calc non Af Amer: >60 mL/min (ref >60)
Glucose, Bld: 86 mg/dL (ref 65–99)
POTASSIUM: 3.5 mmol/L (ref 3.5–5.1)
Sodium: 135 mmol/L (ref 135–145)
Total Bilirubin: 2 mg/dL — ABNORMAL HIGH (ref 0.3–1.2)
Total Protein: 6 g/dL — ABNORMAL LOW (ref 6.5–8.1)

## 2017-11-05 LAB — VITAMIN B12: VITAMIN B 12: 274 pg/mL (ref 180–914)

## 2017-11-05 LAB — IRON AND TIBC
IRON: 5 ug/dL — AB (ref 45–182)
Saturation Ratios: 1 % — ABNORMAL LOW (ref 17.9–39.5)
TIBC: 479 ug/dL — ABNORMAL HIGH (ref 250–450)
UIBC: 474 ug/dL

## 2017-11-05 LAB — PREPARE RBC (CROSSMATCH)

## 2017-11-05 LAB — PROTIME-INR
INR: 1.01
Prothrombin Time: 13.2 seconds (ref 11.4–15.2)

## 2017-11-05 LAB — HEMOGLOBIN AND HEMATOCRIT, BLOOD
HCT: 30.1 % — ABNORMAL LOW (ref 39.0–52.0)
Hemoglobin: 9.6 g/dL — ABNORMAL LOW (ref 13.0–17.0)

## 2017-11-05 LAB — GLUCOSE, CAPILLARY: Glucose-Capillary: 93 mg/dL (ref 65–99)

## 2017-11-05 LAB — C-REACTIVE PROTEIN: CRP: <0.8 mg/dL (ref <1.0)

## 2017-11-05 LAB — HIV ANTIBODY (ROUTINE TESTING W REFLEX): HIV Screen 4th Generation wRfx: NONREACTIVE

## 2017-11-05 LAB — SEDIMENTATION RATE: SED RATE: 21 mm/h — AB (ref 0–16)

## 2017-11-05 LAB — MRSA PCR SCREENING
MRSA by PCR: INVALID — AB
MRSA by PCR: NEGATIVE

## 2017-11-05 LAB — FOLATE: FOLATE: 6.6 ng/mL (ref >5.9)

## 2017-11-05 LAB — TSH: TSH: 0.478 u[IU]/mL (ref 0.350–4.500)

## 2017-11-05 LAB — HEMOGLOBIN A1C
Hgb A1c MFr Bld: 5.2 % (ref 4.8–5.6)
MEAN PLASMA GLUCOSE: 102.54 mg/dL

## 2017-11-05 LAB — FERRITIN: Ferritin: 4 ng/mL — ABNORMAL LOW (ref 24–336)

## 2017-11-05 SURGERY — ESOPHAGOGASTRODUODENOSCOPY (EGD) WITH PROPOFOL
Anesthesia: Monitor Anesthesia Care

## 2017-11-05 MED ORDER — ACETAMINOPHEN 650 MG RE SUPP: 650.0000 mg | Freq: Four times a day (QID) | RECTAL | Status: DC | PRN

## 2017-11-05 MED ORDER — PANTOPRAZOLE SODIUM 40 MG IV SOLR: 40.0000 mg | Freq: Two times a day (BID) | INTRAVENOUS | Status: DC

## 2017-11-05 MED ORDER — FLUTICASONE FUROATE-VILANTEROL 200-25 MCG/INH IN AEPB
1.0000 | INHALATION_SPRAY | Freq: Every day | RESPIRATORY_TRACT | Status: DC
  Administered 2017-11-05 – 2017-11-08 (×3): 1 via RESPIRATORY_TRACT
  Filled 2017-11-05: qty 28

## 2017-11-05 MED ORDER — ASPIRIN EC 81 MG PO TBEC: 81.0000 mg | DELAYED_RELEASE_TABLET | Freq: Every day | ORAL | Status: DC

## 2017-11-05 MED ORDER — PHENYLEPHRINE HCL 10 MG/ML IJ SOLN
INTRAMUSCULAR | Status: DC | PRN
  Administered 2017-11-05: 20 ug/min via INTRAVENOUS

## 2017-11-05 MED ORDER — SODIUM CHLORIDE 0.9 % IV SOLN
Freq: Once | INTRAVENOUS | Status: AC
  Administered 2017-11-05: 11:00:00 via INTRAVENOUS

## 2017-11-05 MED ORDER — NICOTINE 14 MG/24HR TD PT24
14.0000 mg | MEDICATED_PATCH | Freq: Every day | TRANSDERMAL | Status: DC
  Administered 2017-11-05 – 2017-11-08 (×4): 14 mg via TRANSDERMAL
  Filled 2017-11-05 (×4): qty 1

## 2017-11-05 MED ORDER — SODIUM CHLORIDE 0.9 % IV SOLN
8.0000 mg/h | INTRAVENOUS | Status: AC
  Administered 2017-11-05: 8 mg/h via INTRAVENOUS
  Administered 2017-11-06: 12:00:00 via INTRAVENOUS
  Administered 2017-11-06 – 2017-11-08 (×5): 8 mg/h via INTRAVENOUS
  Filled 2017-11-05 (×10): qty 80

## 2017-11-05 MED ORDER — CYANOCOBALAMIN 1000 MCG/ML IJ SOLN
1000.0000 ug | Freq: Every day | INTRAMUSCULAR | Status: DC
  Administered 2017-11-05 – 2017-11-08 (×4): 1000 ug via INTRAMUSCULAR
  Filled 2017-11-05 (×4): qty 1

## 2017-11-05 MED ORDER — FOLIC ACID 1 MG PO TABS
1.0000 mg | ORAL_TABLET | Freq: Every day | ORAL | Status: DC
  Administered 2017-11-05 – 2017-11-08 (×4): 1 mg via ORAL
  Filled 2017-11-05 (×4): qty 1

## 2017-11-05 MED ORDER — SODIUM CHLORIDE 0.9 % IV SOLN
80.0000 mg | Freq: Once | INTRAVENOUS | Status: AC
  Administered 2017-11-05: 80 mg via INTRAVENOUS
  Filled 2017-11-05: qty 80

## 2017-11-05 MED ORDER — ADULT MULTIVITAMIN W/MINERALS CH
1.0000 | ORAL_TABLET | Freq: Every day | ORAL | Status: DC
  Administered 2017-11-05 – 2017-11-08 (×4): 1 via ORAL
  Filled 2017-11-05 (×4): qty 1

## 2017-11-05 MED ORDER — POLYETHYLENE GLYCOL 3350 17 G PO PACK: 17.0000 g | PACK | Freq: Every day | ORAL | Status: DC | PRN

## 2017-11-05 MED ORDER — FLUTICASONE PROPIONATE 50 MCG/ACT NA SUSP: 2.0000 | Freq: Every day | NASAL | Status: DC | PRN

## 2017-11-05 MED ORDER — LACTATED RINGERS IV SOLN
INTRAVENOUS | Status: DC | PRN
  Administered 2017-11-05: 11:00:00 via INTRAVENOUS

## 2017-11-05 MED ORDER — PROPOFOL 10 MG/ML IV BOLUS
INTRAVENOUS | Status: DC | PRN
  Administered 2017-11-05: 120 mg via INTRAVENOUS
  Administered 2017-11-05: 20 mg via INTRAVENOUS
  Administered 2017-11-05: 80 mg via INTRAVENOUS

## 2017-11-05 MED ORDER — STROKE: EARLY STAGES OF RECOVERY BOOK
Freq: Once | Status: AC
  Administered 2017-11-05: 03:00:00
  Filled 2017-11-05: qty 1

## 2017-11-05 MED ORDER — TRAMADOL HCL 50 MG PO TABS: 50.0000 mg | ORAL_TABLET | Freq: Four times a day (QID) | ORAL | Status: DC | PRN

## 2017-11-05 MED ORDER — ATORVASTATIN CALCIUM 80 MG PO TABS
80.0000 mg | ORAL_TABLET | Freq: Every day | ORAL | Status: DC
  Administered 2017-11-05 – 2017-11-07 (×3): 80 mg via ORAL
  Filled 2017-11-05 (×3): qty 1

## 2017-11-05 MED ORDER — GADOBENATE DIMEGLUMINE 529 MG/ML IV SOLN
20.0000 mL | Freq: Once | INTRAVENOUS | Status: AC
  Administered 2017-11-05: 20 mL via INTRAVENOUS

## 2017-11-05 MED ORDER — SODIUM CHLORIDE 0.9 % IV SOLN: INTRAVENOUS | Status: DC

## 2017-11-05 MED ORDER — VITAMIN B-1 100 MG PO TABS
100.0000 mg | ORAL_TABLET | Freq: Every day | ORAL | Status: DC
  Administered 2017-11-05 – 2017-11-08 (×4): 100 mg via ORAL
  Filled 2017-11-05 (×4): qty 1

## 2017-11-05 MED ORDER — POTASSIUM CHLORIDE IN NACL 40-0.9 MEQ/L-% IV SOLN
INTRAVENOUS | Status: DC
  Administered 2017-11-05 (×2): 75 mL/h via INTRAVENOUS
  Administered 2017-11-06: 12:00:00 via INTRAVENOUS
  Administered 2017-11-06 – 2017-11-08 (×3): 75 mL/h via INTRAVENOUS
  Filled 2017-11-05 (×10): qty 1000

## 2017-11-05 MED ORDER — ACETAMINOPHEN 325 MG PO TABS: 650.0000 mg | ORAL_TABLET | Freq: Four times a day (QID) | ORAL | Status: DC | PRN

## 2017-11-05 SURGICAL SUPPLY — 15 items

## 2017-11-05 NOTE — Progress Notes (Signed)
STROKE TEAM PROGRESS NOTE   HISTORY OF PRESENT ILLNESS (per record) [name] is an 62 y.o. male with a 2 day history of graying out vision and then woke up with total vision loss OS today.  He has NOT been seen by an ophthalmologist.  No eye pain .  No headache. CT Brain was normal.  CTA Brain and Neck is normal except for high grade stenosis of left common carotid artery.  He also has known duodenal ulcers with recent bleeding causing Hg 4.  He is getting blood transfusion now and GI is to see him soon.  He has not been given an antiplatelets due to GI bleed. UDS is positive for cocaine and THC.       SUBJECTIVE (INTERVAL HISTORY) His family is not at bedside. Endorses doing Cocaine 3 days ago "Just a few lines for my birthday"    OBJECTIVE Temp:  [97.6 F (36.4 C)-99 F (37.2 C)] 97.6 F (36.4 C) (05/26 1810) Pulse Rate:  [65-85] 70 (05/26 1810) Cardiac Rhythm: Normal sinus rhythm (05/26 0700) Resp:  [14-23] 14 (05/26 1810) BP: (94-119)/(45-77) 118/65 (05/26 1810) SpO2:  [100 %] 100 % (05/26 1810) Weight:  [186 lb 11.7 oz (84.7 kg)] 186 lb 11.7 oz (84.7 kg) (05/26 0118)  CBC:  Recent Labs  Lab 11/04/17 1353 11/04/17 1502 11/05/17 0253  WBC 6.6  --  7.1  NEUTROABS 3.7  --   --   HGB 4.7* 4.3* 7.2*  HCT 15.4* 14.4* 23.0*  MCV 74.4*  --  79.6  PLT 339  --  761    Basic Metabolic Panel:  Recent Labs  Lab 11/04/17 1353 11/05/17 0253  NA 136 135  K 2.9* 3.5  CL 101 104  CO2 24 22  GLUCOSE 97 86  BUN 11 7  CREATININE 1.05 0.95  CALCIUM 8.5* 8.4*    Lipid Panel:     Component Value Date/Time   CHOL 97 11/05/2017 0253   CHOL 156 06/28/2017 1216   TRIG 57 11/05/2017 0253   HDL 44 11/05/2017 0253   HDL 72 06/28/2017 1216   CHOLHDL 2.2 11/05/2017 0253   VLDL 11 11/05/2017 0253   LDLCALC 42 11/05/2017 0253   LDLCALC 65 06/28/2017 1216   HgbA1c:  Lab Results  Component Value Date   HGBA1C 5.2 11/05/2017   Urine Drug Screen:     Component Value  Date/Time   LABOPIA NONE DETECTED 11/04/2017 1353   COCAINSCRNUR POSITIVE (A) 11/04/2017 1353   COCAINSCRNUR POS (A) 10/06/2015 1607   LABBENZ NONE DETECTED 11/04/2017 1353   LABBENZ NEG 10/06/2015 1607   AMPHETMU NONE DETECTED 11/04/2017 1353   THCU POSITIVE (A) 11/04/2017 1353   LABBARB NONE DETECTED 11/04/2017 1353    Alcohol Level No results found for: ETH     IMAGING  Ct Head Wo Contrast Ct Angio Head W Or Wo Contrast Ct Head Wo Contrast 11/04/2017 IMPRESSION:  1. High-grade stenosis of the mid left common carotid artery measuring greater than 75% relative to the more distal vessels.  2. Additional soft tissue plaque in the posterior wall of the proximal left internal carotid artery without focal stenosis.  3. Focal calcification in the posterior wall of the distal internal carotid artery bilaterally just below the skull base. This could be related to remote trauma. There is no lumen compromise associated.  4. No focal disease at the ophthalmic segment on the left.  5. Scattered white matter hypoattenuation bilaterally likely reflects the sequela of chronic  microvascular ischemia.  6. No acute cortical infarct.     MRI Brain - MRI brain demonstrating multifocal subcentimeter watershed infarcts of the LEFT hemisphere. High-grade stenosis of the LEFT CCA .      PHYSICAL EXAM Vitals:   11/05/17 1230 11/05/17 1404 11/05/17 1717 11/05/17 1810  BP: 104/61 (!) 101/45 119/65 118/65  Pulse: 65 67 67 70  Resp: 16 18 16 14   Temp: 98.8 F (37.1 C) 98.4 F (36.9 C) 98.3 F (36.8 C) 97.6 F (36.4 C)  TempSrc: Oral Oral Oral Oral  SpO2: 100%  100% 100%  Weight:      Height:        General -  Heart - Regular rate and rhythm - no murmer appreciated Lungs - Clear to auscultation anteriorly Abdomen - Soft - non tender Extremities - Distal pulses intact - no edema Skin - Warm and dry  Mental Status: Alert, oriented, thought content appropriate.  Speech fluent without  evidence of aphasia.  Able to follow 3 step commands without difficulty. Cranial Nerves: II: Discs not visualized;  pupils equal, round, reactive to light. Decreased vision left eye, can see shadows and light only III,IV, VI: ptosis not present, extra-ocular motions intact bilaterally V,VII: smile symmetric, facial light touch sensation normal bilaterally VIII: hearing normal bilaterally IX,X: gag reflex present XI: bilateral shoulder shrug intact. XII: midline tongue extension Motor: RUE - 5/5    LUE - 5/5 RLE - 5/5    LLE -  5/5 Tone and bulk:normal tone throughout; no atrophy noted Sensory: Light touch intact throughout, bilaterally Deep Tendon Reflexes:  symmetric throughout   ASSESSMENT/PLAN Mr. [name] is a 62 y.o. male with history of tobacco use, coronary artery disease with previous MI, hypertension, hyperlipidemia, duodenal ulcers with recent GI bleed and severe anemia, COPD,and drug abuse including marijuana and cocaine, presenting with loss of vision in the left eye. He did not receive IV t-PA due to history of GI bleeding.  Infarcts in the setting of GI bleed and cocaine use. multifocal subcentimeter watershed infarcts of the LEFT hemisphere. High-grade stenosis of the LEFT CCA .     Resultant  Left eye vision loss  CT head  - no acute cortical infarct  MRI -  multifocal subcentimeter watershed infarcts of the LEFT hemisphere in the setting of GI bleeds. High-grade stenosis of the LEFT CCA   Carotid Doppler - CTA neck  2D Echo - pending  LDL 42  HgbA1c - 5.2  VTE prophylaxis - SCDs Diet Order           Diet full liquid Room service appropriate? Yes; Fluid consistency: Thin  Diet effective now          No antithrombotic prior to admission, now on No antithrombotic due to GI bleed  Patient counseled to be compliant with his antithrombotic medications  Ongoing aggressive stroke risk factor management  Therapy recommendations:   pending  Disposition:  Pending  Hypertension  Blood pressure tends to run low . Permissive hypertension (OK if < 220/120) but gradually normalize in 5-7 days . Long-term BP goal normotensive  Hyperlipidemia  Lipid lowering medication PTA:  Lipitor 80 mg daily  LDL 42, goal < 70  Current lipid lowering medication:  Lipitor 80 mg daily  Continue statin at discharge   Other Stroke Risk Factors  Advanced age  Cigarette smoker - advised to stop smoking  ETOH use, advised to drink no more than 1 alcoholic beverage per day.   Coronary  artery disease  Cocaine use 3 days ago   Other Active Problems  GI bleed history - EGD today  Right carotid stenosis - consult Dr Bridgett Larsson  Anemia   PLAN Left eye vision loss over 3 days (he did cocaine 3 days ago).  multifocal subcentimeter watershed infarcts of the LEFT hemisphere in the setting of GI bleed. Stroke due to high-grade stenosis of the LEFT CCA possibly lowered blood pressure and hypoperfusion 2ndary to GI bleed. Vascular needs to be consulted on Monday (MRI came back Sunday evening, call in the morning). Optic nerves normal on MRI.   Hospital day # 1  Personally  participated in, made any corrections needed, and agree with history, physical, neuro exam,assessment and plan as stated above.     Sarina Ill, MD [facility]  To contact Stroke Continuity provider, please refer to [email or web address] After hours, contact General Neurology

## 2017-11-05 NOTE — Transfer of Care (Signed)
Immediate Anesthesia Transfer of Care Note  Patient: [name]  Procedure(s) Performed: ESOPHAGOGASTRODUODENOSCOPY (EGD) WITH PROPOFOL (N/A ) BIOPSY  Patient Location: PACU  Anesthesia Type:MAC  Level of Consciousness: awake and sedated  Airway & Oxygen Therapy: Patient Spontanous Breathing and Patient connected to nasal cannula oxygen  Post-op Assessment: Report given to RN, Post -op Vital signs reviewed and stable, Patient moving all extremities and Patient moving all extremities X 4  Post vital signs: Reviewed and stable  Last Vitals:  Vitals Value Taken Time  BP    Temp    Pulse    Resp    SpO2      Last Pain:  Vitals:   11/05/17 1050  TempSrc: Oral  PainSc:          Complications: No apparent anesthesia complications

## 2017-11-05 NOTE — Progress Notes (Signed)
Blood transfusion ended, pt tolerated well, no apparent distress noted.we'll continue to monitor

## 2017-11-05 NOTE — Consult Note (Signed)
Chief Complaint  Patient presents with  . Eye Problem  :       Ophthalmology HPI: This is a 62 y.o.  male with a past ocular history listed below that presents with three days of progressive painless vision decline int he left eye. This started Thursday 11/03/2017. It began with a shadow over the inferior vision and progressively worsened over the next several days. Now everything is blurry     Past Ocular History:  No known eye problems    Last Eye Exam: 20 years ago    Primary Eye Care: None   Past Medical History:  Diagnosis Date  . Anginal pain ([place])   . CAD (coronary artery disease)    4V CABG 10/31/13  . Cocaine abuse ([place])   . COPD (chronic obstructive pulmonary disease) ([place])   . GERD (gastroesophageal reflux disease)   . H/O: eczema    lower extremities  . H/O: GI bleed    a. 62 y/o ago, r/t ulcer. Denies recent GIB.  Marland Kitchen High cholesterol   . Hypertension   . Myocardial infarction ([place])   . Perforation of duodenal ulcer ([place])    a. 1970s - s/p surgery.   . Shortness of breath dyspnea   . Tobacco abuse      Past Surgical History:  Procedure Laterality Date  . CORONARY ARTERY BYPASS GRAFT N/A 10/31/2013   Procedure: CORONARY ARTERY BYPASS GRAFTING TIMES FOUR ON PUMP USING LEFT INTERNAL MAMMARY ARTERY AND RIGHT GREATER SAPHENOUS VEIN VIA ENDOVEIN HARVEST.;  Surgeon: Melrose Nakayama, MD;  Location: [facility];  Service: Open Heart Surgery;  Laterality: N/A;  . INTRAOPERATIVE TRANSESOPHAGEAL ECHOCARDIOGRAM N/A 10/31/2013   Procedure: INTRAOPERATIVE TRANSESOPHAGEAL ECHOCARDIOGRAM;  Surgeon: Melrose Nakayama, MD;  Location: [facility];  Service: Open Heart Surgery;  Laterality: N/A;  . LEFT HEART CATHETERIZATION WITH CORONARY ANGIOGRAM N/A 10/28/2013   Procedure: LEFT HEART CATHETERIZATION WITH CORONARY ANGIOGRAM;  Surgeon: Burnell Blanks, MD;  Location[facility] CATH LAB;  Service: Cardiovascular;  Laterality: N/A;  . stomach sx       Social History    Socioeconomic History  . Marital status: Single    Spouse name: Not on file  . Number of children: Not on file  . Years of education: Not on file  . Highest education level: Not on file  Occupational History  . Not on file  Social Needs  . Financial resource strain: Not on file  . Food insecurity:    Worry: Not on file    Inability: Not on file  . Transportation needs:    Medical: Not on file    Non-medical: Not on file  Tobacco Use  . Smoking status: Current Every Day Smoker    Packs/day: 0.50    Years: 40.00    Pack years: 20.00    Types: Cigarettes  . Smokeless tobacco: Never Used  Substance and Sexual Activity  . Alcohol use: Yes    Alcohol/week: 1.2 oz    Types: 2 Cans of beer per week    Comment: 6 pack over the weekend   . Drug use: Yes    Types: Marijuana, Cocaine    Comment: "about 1 marijuana cigarette a week"  . Sexual activity: Yes    Birth control/protection: None  Lifestyle  . Physical activity:    Days per week: Not on file    Minutes per session: Not on file  . Stress: Not on file  Relationships  . Social connections:  Talks on phone: Not on file    Gets together: Not on file    Attends religious service: Not on file    Active member of club or organization: Not on file    Attends meetings of clubs or organizations: Not on file    Relationship status: Not on file  . Intimate partner violence:    Fear of current or ex partner: Not on file    Emotionally abused: Not on file    Physically abused: Not on file    Forced sexual activity: Not on file  Other Topics Concern  . Not on file  Social History Narrative  . Not on file     No Known Allergies   No current facility-administered medications on file prior to encounter.    Current Outpatient Medications on File Prior to Encounter  Medication Sig Dispense Refill  . aspirin 325 MG EC tablet Take 1 tablet (325 mg total) by mouth daily. 30 tablet 5  . atorvastatin (LIPITOR) 80 MG tablet  TAKE 1 TABLET BY MOUTH DAILY AT 6 PM. 90 tablet 1  . fluticasone (FLONASE) 50 MCG/ACT nasal spray Place 2 sprays into both nostrils as needed for allergies or rhinitis.    . fluticasone furoate-vilanterol (BREO ELLIPTA) 200-25 MCG/INH AEPB Inhale 1 puff into the lungs daily. 180 each 3  . hydrochlorothiazide (MICROZIDE) 12.5 MG capsule TAKE 1 CAPSULE BY MOUTH DAILY. 90 capsule 1  . triamcinolone (KENALOG) 0.025 % ointment APPLY 1 APPLICATION TOPICALLY 2 TIMES DAILY. (Patient taking differently: APPLY 1 APPLICATION TOPICALLY 2 TIMES qd prn rash) 30 g 2  . VENTOLIN HFA 108 (90 Base) MCG/ACT inhaler INHALE 2 PUFFS INTO THE LUNGS EVERY 6 HOURS AS NEEDED FOR WHEEZING OR SHORTNESS OF BREATH. 18 g 2  . albuterol (PROVENTIL HFA;VENTOLIN HFA) 108 (90 Base) MCG/ACT inhaler Inhale 2 puffs into the lungs every 6 (six) hours as needed for wheezing or shortness of breath. (Patient not taking: Reported on 06/28/2017) 54 g 3  . budesonide-formoterol (SYMBICORT) 160-4.5 MCG/ACT inhaler Inhale 2 puffs into the lungs 2 (two) times daily. (Patient not taking: Reported on 06/28/2017) 1 Inhaler 3  . hydrochlorothiazide (MICROZIDE) 12.5 MG capsule TAKE 1 CAPSULE BY MOUTH DAILY. (Patient not taking: Reported on 11/04/2017) 90 capsule 1  . hydrochlorothiazide (MICROZIDE) 12.5 MG capsule TAKE 1 CAPSULE BY MOUTH DAILY. (Patient not taking: Reported on 11/04/2017) 90 capsule 1  . nicotine (NICODERM CQ) 14 mg/24hr patch Place 1 patch (14 mg total) onto the skin daily. (Patient not taking: Reported on 06/28/2017) 30 patch 0  . SYMBICORT 160-4.5 MCG/ACT inhaler INHALE 2 PUFFS INTO THE LUNGS 2 TIMES DAILY. (Patient not taking: Reported on 11/04/2017) 10.2 g 3     ROS    Exam:  General: Awake, Alert, Oriented *3  Vision (near): without correction    OD: 5 point  OS: HM  Confrontational Field:   Full to count fingers right eye. Responds to stimuli both hemispheres left eye  Extraocular Motility:  Full ductions and versions,  both eyes  External:   Normal Symmetry, V1-3 intact, infraorbital nerve appears intact.  Pupils  OD: 44mm to 58mm reactive without afferent pupillary defect (APD)  OS: 6 to 31mm,  Brisk afferent pupillary defect present  IOP(tonopen)  OD: 11  OS: 9  Slit Lamp Exam:  Lids/Lashes  OD: Normal Lids and lashes, No lesion or injury  OS: Normal lids and lashes, nor lesion or injury  Conjucntiva/Sclera  OD: White and quiet  OS: White  and quiet  Cornea  OD: Clear without abrasion or defect  OS: Clear without abrasion or defect  Anterior Chamber  OD: Deep and quiet  OS: Deep and quiet  Iris  OD: Normal iris architecture  OS: Normal Iris Architecture   Lens  OD: 1+ NSC  OS: 1+ NSC   Anterior Vitreous  OD: Clear, without cell  OS: Clear without cell   POSTERIOR POLE EXAM (Dialated with phenylephrine and tropicamide.Dilation may last up to 24 hours)  View:   OD: 20/20 view without opacities  OS: 20/20 view without opacities  Vitreous:   OD: Clear, no cell  OS: Clear, no cell  Disc:   OD: Normal disc, several cotton wool spots surrounding disc  OS: possible mild disc elevation, cotton wool spots around disc  C:D Ratio:   OD: 0.3   OS: 0.3  Macula  OD: pigment changes  OS: normal, No cherry red spot  Vessels  OD: mildly attenuated  OS: attenuated  Periphery  OD: Flat 360 degrees without tear, hole or detachment  OS: Flat 360 degrees without tear, hole or detachment               No mid peripheral dot blot hemorrhages.      Assessment and Plan:   This is 62 y.o.  male with optic neuropathy left eye vs. Ocular ischemic syndrome. There is no evidence of a central retinal artery occlusion given no retinal edema. I do not see any Hollenhorst plaques.  This is likely due to carotid stenosis and severe anemia.   From an eye stand point, there is no acute intervention required.   The patient will need to follow up as an outpatient within one week from discharge.    Please do not hesitate to contact me with any questions or concerns.   Julian Reil, M.D.  [facility] [address] [place], [place] [address] [phone] ([phone]

## 2017-11-05 NOTE — Progress Notes (Signed)
PT Cancellation Note  Patient Details Name: ANANTH FIALLOS MRN: [id number] DOB: 02/24/1956   Cancelled Treatment:    Reason Eval/Treat Not Completed: Patient at procedure or test/unavailable. Pt on his way to endo.   Shary Decamp [place] 11/05/2017, 10:52 AM [place]

## 2017-11-05 NOTE — Progress Notes (Signed)
Pt arrived to the floor via care link with Blood is transfusing. Patient alert and oriented VSS, CHG bath given, heart monitor applied CCMD notified

## 2017-11-05 NOTE — Progress Notes (Signed)
[name], from [facility] called to rectify the Retic ct value and the absolute.  Retic ct            Absolute Value Old value: >23    Not calculated New value 2.2     0.0 Will put this information on the MD sticky note

## 2017-11-05 NOTE — Consult Note (Signed)
Reason for Consult: Left eye blindness Referring Physician: Dr. Derinda Late is an 63 y.o. male.  HPI: 2 day history of graying out vision and then woke up with total vision loss OS today.  He has NOT been seen by an ophthalmologist.  No eye pain .  No headache.  CT Brain was normal.  CTA Brain and Neck is normal except for high grade stenosis of left common carotid artery.  He also has known duodenal ulcers with recent bleeding causing Hg 4.  He is getting blood transfusion now and GI is to see him soon.  He has not been given an antiplatelets due to GI bleed. UDS is positive for cocaine and THC.    Past Medical History:  Diagnosis Date  . Anginal pain ([place])   . CAD (coronary artery disease)    4V CABG 10/31/13  . Cocaine abuse ([place])   . COPD (chronic obstructive pulmonary disease) ([place])   . GERD (gastroesophageal reflux disease)   . H/O: eczema    lower extremities  . H/O: GI bleed    a. 62 y/o ago, r/t ulcer. Denies recent GIB.  Marland Kitchen High cholesterol   . Hypertension   . Myocardial infarction ([place])   . Perforation of duodenal ulcer ([place])    a. 1970s - s/p surgery.   . Shortness of breath dyspnea   . Tobacco abuse     Past Surgical History:  Procedure Laterality Date  . CORONARY ARTERY BYPASS GRAFT N/A 10/31/2013   Procedure: CORONARY ARTERY BYPASS GRAFTING TIMES FOUR ON PUMP USING LEFT INTERNAL MAMMARY ARTERY AND RIGHT GREATER SAPHENOUS VEIN VIA ENDOVEIN HARVEST.;  Surgeon: Melrose Nakayama, MD;  Location: [facility];  Service: Open Heart Surgery;  Laterality: N/A;  . INTRAOPERATIVE TRANSESOPHAGEAL ECHOCARDIOGRAM N/A 10/31/2013   Procedure: INTRAOPERATIVE TRANSESOPHAGEAL ECHOCARDIOGRAM;  Surgeon: Melrose Nakayama, MD;  Location: [facility];  Service: Open Heart Surgery;  Laterality: N/A;  . LEFT HEART CATHETERIZATION WITH CORONARY ANGIOGRAM N/A 10/28/2013   Procedure: LEFT HEART CATHETERIZATION WITH CORONARY ANGIOGRAM;  Surgeon: Burnell Blanks, MD;  Location: [facility] CATH  LAB;  Service: Cardiovascular;  Laterality: N/A;  . stomach sx      Family History  Problem Relation Age of Onset  . Lung cancer Unknown     Social History:  reports that he has been smoking cigarettes.  He has a 20.00 pack-year smoking history. He has never used smokeless tobacco. He reports that he drinks about 1.2 oz of alcohol per week. He reports that he has current or past drug history. Drugs: Marijuana and Cocaine.  Allergies: No Known Allergies  Prior to Admission medications   Medication Sig Start Date End Date Taking? Authorizing Provider  aspirin 325 MG EC tablet Take 1 tablet (325 mg total) by mouth daily. 11/19/15  Yes Dorena Dew, FNP  atorvastatin (LIPITOR) 80 MG tablet TAKE 1 TABLET BY MOUTH DAILY AT 6 PM. 08/30/17  Yes Dorena Dew, FNP  fluticasone [facility]) 50 MCG/ACT nasal spray Place 2 sprays into both nostrils as needed for allergies or rhinitis.   Yes [provider]  fluticasone furoate-vilanterol (BREO ELLIPTA) 200-25 MCG/INH AEPB Inhale 1 puff into the lungs daily. 01/27/17  Yes Jegede, Olugbemiga E, MD  hydrochlorothiazide (MICROZIDE) 12.5 MG capsule TAKE 1 CAPSULE BY MOUTH DAILY. 08/30/17  Yes Dorena Dew, FNP  triamcinolone (KENALOG) 0.025 % ointment APPLY 1 APPLICATION TOPICALLY 2 TIMES DAILY. Patient taking differently: APPLY 1 APPLICATION TOPICALLY 2 TIMES qd prn  rash 10/20/16  Yes Dorena Dew, FNP  VENTOLIN HFA 108 (90 Base) MCG/ACT inhaler INHALE 2 PUFFS INTO THE LUNGS EVERY 6 HOURS AS NEEDED FOR WHEEZING OR SHORTNESS OF BREATH. 10/10/17  Yes Dorena Dew, FNP  albuterol (PROVENTIL HFA;VENTOLIN HFA) 108 (90 Base) MCG/ACT inhaler Inhale 2 puffs into the lungs every 6 (six) hours as needed for wheezing or shortness of breath. Patient not taking: Reported on 06/28/2017 01/27/17   Tresa Garter, MD  budesonide-formoterol [facility]) 160-4.5 MCG/ACT inhaler Inhale 2 puffs into the lungs 2 (two) times daily. Patient not taking:  Reported on 06/28/2017 11/28/16   Dorena Dew, FNP  hydrochlorothiazide (MICROZIDE) 12.5 MG capsule TAKE 1 CAPSULE BY MOUTH DAILY. Patient not taking: Reported on 11/04/2017 01/13/17   Dorena Dew, FNP  hydrochlorothiazide (MICROZIDE) 12.5 MG capsule TAKE 1 CAPSULE BY MOUTH DAILY. Patient not taking: Reported on 11/04/2017 08/30/17   Dorena Dew, FNP  nicotine (NICODERM CQ) 14 mg/24hr patch Place 1 patch (14 mg total) onto the skin daily. Patient not taking: Reported on 06/28/2017 11/28/16   Dorena Dew, FNP  SYMBICORT 160-4.5 MCG/ACT inhaler INHALE 2 PUFFS INTO THE LUNGS 2 TIMES DAILY. Patient not taking: Reported on 11/04/2017 03/16/17   Dorena Dew, FNP    Medications:  Scheduled: .  stroke: mapping our early stages of recovery book   Does not apply Once  . atorvastatin  80 mg Oral q1800  . fluticasone furoate-vilanterol  1 puff Inhalation Daily  . folic acid  1 mg Oral Daily  . iopamidol      . multivitamin with minerals  1 tablet Oral Daily  . nicotine  14 mg Transdermal Daily  . pantoprazole (PROTONIX) IV  40 mg Intravenous Q12H  . thiamine  100 mg Oral Daily    Results for orders placed or performed during the hospital encounter of 11/04/17 (from the past 48 hour(s))  CBC with Differential     Status: Abnormal   Collection Time: 11/04/17  1:53 PM  Result Value Ref Range   WBC 6.6 4.0 - 10.5 K/uL   RBC 2.07 (L) 4.22 - 5.81 MIL/uL   Hemoglobin 4.7 (LL) 13.0 - 17.0 g/dL    Comment: REPEATED TO VERIFY CRITICAL RESULT CALLED TO, READ BACK BY AND VERIFIED WITH: KOOS, CHEALSEA _0 :43 BLACK,M 11/04/17    HCT 15.4 (L) 39.0 - 52.0 %   MCV 74.4 (L) 78.0 - 100.0 fL   MCH 22.7 (L) 26.0 - 34.0 pg   MCHC 30.5 30.0 - 36.0 g/dL   RDW 21.6 (H) 11.5 - 15.5 %   Platelets 339 150 - 400 K/uL   Neutrophils Relative % 57 %   Lymphocytes Relative 25 %   Monocytes Relative 11 %   Eosinophils Relative 6 %   Basophils Relative 1 %   Neutro Abs 3.7 1.7 - 7.7 K/uL   Lymphs  Abs 1.7 0.7 - 4.0 K/uL   Monocytes Absolute 0.7 0.1 - 1.0 K/uL   Eosinophils Absolute 0.4 0.0 - 0.7 K/uL   Basophils Absolute 0.1 0.0 - 0.1 K/uL   RBC Morphology POLYCHROMASIA PRESENT     Comment: Performed at [facility], [place] [address], [place], [place] [address]  Basic metabolic panel     Status: Abnormal   Collection Time: 11/04/17  1:53 PM  Result Value Ref Range   Sodium 136 135 - 145 mmol/L   Potassium 2.9 (L) 3.5 - 5.1 mmol/L   Chloride 101 101 -  111 mmol/L   CO2 24 22 - 32 mmol/L   Glucose, Bld 97 65 - 99 mg/dL   BUN 11 6 - 20 mg/dL   Creatinine, Ser 1.05 0.61 - 1.24 mg/dL   Calcium 8.5 (L) 8.9 - 10.3 mg/dL   GFR calc non Af Amer >60 >60 mL/min   GFR calc Af Amer >60 >60 mL/min    Comment: (NOTE) The eGFR has been calculated using the CKD EPI equation. This calculation has not been validated in all clinical situations. eGFR's persistently <60 mL/min signify possible Chronic Kidney Disease.    Anion gap 11 5 - 15    Comment: Performed at [facility], [place] [address], [place], [place] [address]  Protime-INR     Status: None   Collection Time: 11/04/17  1:53 PM  Result Value Ref Range   Prothrombin Time 12.4 11.4 - 15.2 seconds   INR 0.93     Comment: Performed at [facility], [place] [address], [place], [place] [address]  Sedimentation rate     Status: Abnormal   Collection Time: 11/04/17  1:53 PM  Result Value Ref Range   Sed Rate 45 (H) 0 - 16 mm/hr    Comment: Performed at [facility], [place] [address], [place], [place] [address]  Rapid urine drug screen (hospital performed)     Status: Abnormal   Collection Time: 11/04/17  1:53 PM  Result Value Ref Range   Opiates NONE DETECTED NONE DETECTED   Cocaine POSITIVE (A) NONE DETECTED   Benzodiazepines NONE DETECTED NONE DETECTED   Amphetamines NONE DETECTED NONE DETECTED   Tetrahydrocannabinol POSITIVE (A) NONE DETECTED   Barbiturates NONE  DETECTED NONE DETECTED    Comment: (NOTE) DRUG SCREEN FOR MEDICAL PURPOSES ONLY.  IF CONFIRMATION IS NEEDED FOR ANY PURPOSE, NOTIFY LAB WITHIN 5 DAYS. LOWEST DETECTABLE LIMITS FOR URINE DRUG SCREEN Drug Class                     Cutoff (ng/mL) Amphetamine and metabolites    1000 Barbiturate and metabolites    200 Benzodiazepine                 213 Tricyclics and metabolites     300 Opiates and metabolites        300 Cocaine and metabolites        300 THC                            50 Performed at [facility], [place] [address], [place], [place] [address]   C-reactive protein     Status: None   Collection Time: 11/04/17  1:54 PM  Result Value Ref Range   CRP <0.8 <1.0 mg/dL    Comment: Performed at [facility], [place] [address] [place], [place] [address]  Hemoglobin and hematocrit, blood     Status: Abnormal   Collection Time: 11/04/17  3:02 PM  Result Value Ref Range   Hemoglobin 4.3 (LL) 13.0 - 17.0 g/dL    Comment: CRITICAL RESULT CALLED TO, READ BACK BY AND VERIFIED WITH: Alanson Aly RN [id number] [place],N    HCT 14.4 (L) 39.0 - 52.0 %    Comment: Performed at [facility], [place] [address], [place], [place] [address]  Sample to Blood Bank     Status: None   Collection Time: 11/04/17  3:02 PM  Result  Value Ref Range   Blood Bank Specimen SAMPLE AVAILABLE FOR TESTING    Sample Expiration      11/07/2017 Performed at [facility], [place] [address], [place], [place] [address]   Type and screen [place]     Status: None (Preliminary result)   Collection Time: 11/04/17  3:39 PM  Result Value Ref Range   ABO/RH(D) O POS    Antibody Screen NEG    Sample Expiration 11/07/2017    Unit Number [id number]    Blood Component Type RED CELLS,LR    Unit division 00    Status of Unit ISSUED    Transfusion Status OK TO TRANSFUSE    Crossmatch Result Compatible    Unit Number [id number]     Blood Component Type RED CELLS,LR    Unit division 00    Status of Unit ISSUED    Transfusion Status OK TO TRANSFUSE    Crossmatch Result      Compatible Performed at [facility], [place] [address] [place], [place] [address]   Prepare RBC     Status: None   Collection Time: 11/04/17  3:39 PM  Result Value Ref Range   Order Confirmation      ORDER PROCESSED BY BLOOD BANK Performed at [facility], [place] [address] [place], [place] [address]   ABO/Rh     Status: None   Collection Time: 11/04/17  3:40 PM  Result Value Ref Range   ABO/RH(D)      O POS Performed at [facility], [place] [address], [place], [place] [address]   POC occult blood, ED     Status: None   Collection Time: 11/04/17  3:49 PM  Result Value Ref Range   Fecal Occult Bld NEGATIVE NEGATIVE  Folate     Status: None   Collection Time: 11/04/17  3:49 PM  Result Value Ref Range   Folate 6.6 >5.9 ng/mL    Comment: Performed at [facility], [place] [address], [place], [place] [address]  Reticulocytes     Status: Abnormal   Collection Time: 11/04/17  3:49 PM  Result Value Ref Range   Retic Ct Pct >23.0 (H) 0.4 - 3.1 %    Comment: RESULTS CONFIRMED BY MANUAL DILUTION   RBC. 1.93 (L) 4.22 - 5.81 MIL/uL   Retic Count, Absolute NOT CALCULATED 19.0 - 186.0 K/uL    Comment: Performed at [facility], [place] [address], [place],  [address]  I-Stat Troponin, ED (not at [facility])     Status: None   Collection Time: 11/04/17  4:19 PM  Result Value Ref Range   Troponin i, poc 0.03 0.00 - 0.08 ng/mL   Comment 3            Comment: Due to the release kinetics of cTnI, a negative result within the first hours of the onset of symptoms does not rule out myocardial infarction with certainty. If myocardial infarction is still suspected, repeat the test at appropriate intervals.   Urinalysis, Routine w reflex microscopic     Status: None   Collection  Time: 11/04/17  4:52 PM  Result Value Ref Range   Color, Urine YELLOW YELLOW   APPearance CLEAR CLEAR   Specific Gravity, Urine 1.023 1.005 - 1.030   pH 7.0 5.0 - 8.0   Glucose, UA NEGATIVE NEGATIVE mg/dL   Hgb urine dipstick NEGATIVE NEGATIVE   Bilirubin Urine NEGATIVE NEGATIVE  Ketones, ur NEGATIVE NEGATIVE mg/dL   Protein, ur NEGATIVE NEGATIVE mg/dL   Nitrite NEGATIVE NEGATIVE   Leukocytes, UA NEGATIVE NEGATIVE    Comment: Performed at [facility], [place] [address], [place], [place] [address]    Ct Angio Head W Or Wo Contrast  Result Date: 11/04/2017 CLINICAL DATA:  New onset of vision loss in the left eye. The patient has no perception of light in the left eye. He began to have diminished vision in the left eye last evening. He awoke with no vision in the left eye today. EXAM: CT ANGIOGRAPHY HEAD AND NECK TECHNIQUE: Multidetector CT imaging of the head and neck was performed using the standard protocol during bolus administration of intravenous contrast. Multiplanar CT image reconstructions and MIPs were obtained to evaluate the vascular anatomy. Carotid stenosis measurements (when applicable) are obtained utilizing NASCET criteria, using the distal internal carotid diameter as the denominator. CONTRAST:  172m ISOVUE-370 IOPAMIDOL (ISOVUE-370) INJECTION 76% COMPARISON:  None. FINDINGS: CT HEAD FINDINGS Brain: Noncontrast imaging the brain demonstrates scattered periventricular white matter hypoattenuation bilaterally. In age indeterminate lacunar infarct is present within the anterior limb of the left internal capsule. A ridging is white matter hypoattenuation is present in the inferior cerebellum bilaterally. No acute hemorrhage or mass lesion is present. The ventricles are of normal size. No significant extra-axial fluid collection is present. Vascular: No hyperdense vessel or unexpected calcification. Skull: Calvarium is intact. No focal lytic or blastic lesions are  present. Sinuses: The paranasal sinuses and mastoid air cells are clear. Orbits: Bilateral globes and orbits are within normal limits. Review of the MIP images confirms the above findings CTA NECK FINDINGS Aortic arch: A 3 vessel arch configuration is present. Atherosclerotic calcifications are present without significant stenosis at the origins the great vessels. There is no aneurysm. Right carotid system: The right common carotid artery is within normal limits. Mild tortuosity is present in the proximal cervical right ICA. There is focal calcification along the posterior wall of the distal right ICA. No stenosis is present. Left carotid system: The left common carotid artery is within normal limits proximally. There is focal stenosis of the mid left common carotid artery. The lumen is narrowed to 1.6 mm. This compares with a more distal segment of 6.5 mm. Soft tissue plaque is noted along the posterior aspect of the left carotid bulb without significant luminal stenosis. Focal calcification is present along the posterior wall of the distal left internal carotid artery just below the skull base without significant stenosis. Vertebral arteries: The vertebral arteries are codominant. Both vertebral arteries originate from the subclavian arteries without significant stenoses. There is no significant stenosis of either vertebral artery in the neck. Skeleton: Focal endplate changes are present with uncovertebral spurring at C6-7. Bilateral osseous foraminal narrowing is worse on the right. Vertebral body heights and alignment are otherwise normal. The patient is status post median sternotomy. An unerupted right maxillary and I tooth is present. An adjacent hypoplastic on are up to tooth is present. The patient is otherwise edentulous along the maxilla. Other neck: Soft tissue lipoma is present along the left occipital scalp. The soft tissues of the neck are unremarkable otherwise. Thyroid is within normal limits.  Salivary glands are unremarkable. No significant adenopathy is present. Upper chest: Centrilobular emphysematous changes are present. No focal nodule or mass lesion is present. No significant pleural effusion is present. Review of the MIP images confirms the above findings CTA HEAD FINDINGS Anterior circulation: The internal carotid arteries  are within normal limits bilaterally from the skull base through the ICA termini. The right A1 segment is dominant. The anterior communicating artery is patent. MCA bifurcation is normal bilaterally. The ACA and MCA branch vessels are within normal limits. Posterior circulation: The vertebral arteries are codominant. Vertebrobasilar junction is normal. PICA origins are visualized and within normal limits. Both posterior cerebral arteries originate from the basilar tip. There is mild irregularity of distal branch vessels without a significant proximal stenosis or occlusion. Venous sinuses: The dural sinuses are patent. Right transverse sinus is dominant. The straight sinus and deep cerebral veins are intact. Cortical veins are unremarkable. Anatomic variants: None Delayed phase: Post-contrast images demonstrate no pathologic enhancement. Review of the MIP images confirms the above findings IMPRESSION: 1. High-grade stenosis of the mid left common carotid artery measuring greater than 75% relative to the more distal vessels. 2. Additional soft tissue plaque in the posterior wall of the proximal left internal carotid artery without focal stenosis. 3. Focal calcification in the posterior wall of the distal internal carotid artery bilaterally just below the skull base. This could be related to remote trauma. There is no lumen compromise associated. 4. No focal disease at the ophthalmic segment on the left. 5. Scattered white matter hypoattenuation bilaterally likely reflects the sequela of chronic microvascular ischemia. 6. No acute cortical infarct. These results were called by  telephone at the time of interpretation on 11/04/2017 at 3:58 pm to [facility], [place] , who verbally acknowledged these results. Electronically Signed   By: San Morelle M.D.   On: 11/04/2017 16:02   Ct Head Wo Contrast  Result Date: 11/04/2017 CLINICAL DATA:  New onset of vision loss in the left eye. The patient has no perception of light in the left eye. He began to have diminished vision in the left eye last evening. He awoke with no vision in the left eye today. EXAM: CT ANGIOGRAPHY HEAD AND NECK TECHNIQUE: Multidetector CT imaging of the head and neck was performed using the standard protocol during bolus administration of intravenous contrast. Multiplanar CT image reconstructions and MIPs were obtained to evaluate the vascular anatomy. Carotid stenosis measurements (when applicable) are obtained utilizing NASCET criteria, using the distal internal carotid diameter as the denominator. CONTRAST:  123m ISOVUE-370 IOPAMIDOL (ISOVUE-370) INJECTION 76% COMPARISON:  None. FINDINGS: CT HEAD FINDINGS Brain: Noncontrast imaging the brain demonstrates scattered periventricular white matter hypoattenuation bilaterally. In age indeterminate lacunar infarct is present within the anterior limb of the left internal capsule. A ridging is white matter hypoattenuation is present in the inferior cerebellum bilaterally. No acute hemorrhage or mass lesion is present. The ventricles are of normal size. No significant extra-axial fluid collection is present. Vascular: No hyperdense vessel or unexpected calcification. Skull: Calvarium is intact. No focal lytic or blastic lesions are present. Sinuses: The paranasal sinuses and mastoid air cells are clear. Orbits: Bilateral globes and orbits are within normal limits. Review of the MIP images confirms the above findings CTA NECK FINDINGS Aortic arch: A 3 vessel arch configuration is present. Atherosclerotic calcifications are present without significant stenosis at the  origins the great vessels. There is no aneurysm. Right carotid system: The right common carotid artery is within normal limits. Mild tortuosity is present in the proximal cervical right ICA. There is focal calcification along the posterior wall of the distal right ICA. No stenosis is present. Left carotid system: The left common carotid artery is within normal limits proximally. There is focal stenosis of the mid left common  carotid artery. The lumen is narrowed to 1.6 mm. This compares with a more distal segment of 6.5 mm. Soft tissue plaque is noted along the posterior aspect of the left carotid bulb without significant luminal stenosis. Focal calcification is present along the posterior wall of the distal left internal carotid artery just below the skull base without significant stenosis. Vertebral arteries: The vertebral arteries are codominant. Both vertebral arteries originate from the subclavian arteries without significant stenoses. There is no significant stenosis of either vertebral artery in the neck. Skeleton: Focal endplate changes are present with uncovertebral spurring at C6-7. Bilateral osseous foraminal narrowing is worse on the right. Vertebral body heights and alignment are otherwise normal. The patient is status post median sternotomy. An unerupted right maxillary and I tooth is present. An adjacent hypoplastic on are up to tooth is present. The patient is otherwise edentulous along the maxilla. Other neck: Soft tissue lipoma is present along the left occipital scalp. The soft tissues of the neck are unremarkable otherwise. Thyroid is within normal limits. Salivary glands are unremarkable. No significant adenopathy is present. Upper chest: Centrilobular emphysematous changes are present. No focal nodule or mass lesion is present. No significant pleural effusion is present. Review of the MIP images confirms the above findings CTA HEAD FINDINGS Anterior circulation: The internal carotid arteries  are within normal limits bilaterally from the skull base through the ICA termini. The right A1 segment is dominant. The anterior communicating artery is patent. MCA bifurcation is normal bilaterally. The ACA and MCA branch vessels are within normal limits. Posterior circulation: The vertebral arteries are codominant. Vertebrobasilar junction is normal. PICA origins are visualized and within normal limits. Both posterior cerebral arteries originate from the basilar tip. There is mild irregularity of distal branch vessels without a significant proximal stenosis or occlusion. Venous sinuses: The dural sinuses are patent. Right transverse sinus is dominant. The straight sinus and deep cerebral veins are intact. Cortical veins are unremarkable. Anatomic variants: None Delayed phase: Post-contrast images demonstrate no pathologic enhancement. Review of the MIP images confirms the above findings IMPRESSION: 1. High-grade stenosis of the mid left common carotid artery measuring greater than 75% relative to the more distal vessels. 2. Additional soft tissue plaque in the posterior wall of the proximal left internal carotid artery without focal stenosis. 3. Focal calcification in the posterior wall of the distal internal carotid artery bilaterally just below the skull base. This could be related to remote trauma. There is no lumen compromise associated. 4. No focal disease at the ophthalmic segment on the left. 5. Scattered white matter hypoattenuation bilaterally likely reflects the sequela of chronic microvascular ischemia. 6. No acute cortical infarct. These results were called by telephone at the time of interpretation on 11/04/2017 at 3:58 pm to [facility], [place] , who verbally acknowledged these results. Electronically Signed   By: San Morelle M.D.   On: 11/04/2017 16:02   Ct Angio Neck W And/or Wo Contrast  Result Date: 11/04/2017 CLINICAL DATA:  New onset of vision loss in the left eye. The patient  has no perception of light in the left eye. He began to have diminished vision in the left eye last evening. He awoke with no vision in the left eye today. EXAM: CT ANGIOGRAPHY HEAD AND NECK TECHNIQUE: Multidetector CT imaging of the head and neck was performed using the standard protocol during bolus administration of intravenous contrast. Multiplanar CT image reconstructions and MIPs were obtained to evaluate the vascular anatomy. Carotid stenosis measurements (when  applicable) are obtained utilizing NASCET criteria, using the distal internal carotid diameter as the denominator. CONTRAST:  120m ISOVUE-370 IOPAMIDOL ([id number]) INJECTION 76% COMPARISON:  None. FINDINGS: CT HEAD FINDINGS Brain: Noncontrast imaging the brain demonstrates scattered periventricular white matter hypoattenuation bilaterally. In age indeterminate lacunar infarct is present within the anterior limb of the left internal capsule. A ridging is white matter hypoattenuation is present in the inferior cerebellum bilaterally. No acute hemorrhage or mass lesion is present. The ventricles are of normal size. No significant extra-axial fluid collection is present. Vascular: No hyperdense vessel or unexpected calcification. Skull: Calvarium is intact. No focal lytic or blastic lesions are present. Sinuses: The paranasal sinuses and mastoid air cells are clear. Orbits: Bilateral globes and orbits are within normal limits. Review of the MIP images confirms the above findings CTA NECK FINDINGS Aortic arch: A 3 vessel arch configuration is present. Atherosclerotic calcifications are present without significant stenosis at the origins the great vessels. There is no aneurysm. Right carotid system: The right common carotid artery is within normal limits. Mild tortuosity is present in the proximal cervical right ICA. There is focal calcification along the posterior wall of the distal right ICA. No stenosis is present. Left carotid system: The left common  carotid artery is within normal limits proximally. There is focal stenosis of the mid left common carotid artery. The lumen is narrowed to 1.6 mm. This compares with a more distal segment of 6.5 mm. Soft tissue plaque is noted along the posterior aspect of the left carotid bulb without significant luminal stenosis. Focal calcification is present along the posterior wall of the distal left internal carotid artery just below the skull base without significant stenosis. Vertebral arteries: The vertebral arteries are codominant. Both vertebral arteries originate from the subclavian arteries without significant stenoses. There is no significant stenosis of either vertebral artery in the neck. Skeleton: Focal endplate changes are present with uncovertebral spurring at C6-7. Bilateral osseous foraminal narrowing is worse on the right. Vertebral body heights and alignment are otherwise normal. The patient is status post median sternotomy. An unerupted right maxillary and I tooth is present. An adjacent hypoplastic on are up to tooth is present. The patient is otherwise edentulous along the maxilla. Other neck: Soft tissue lipoma is present along the left occipital scalp. The soft tissues of the neck are unremarkable otherwise. Thyroid is within normal limits. Salivary glands are unremarkable. No significant adenopathy is present. Upper chest: Centrilobular emphysematous changes are present. No focal nodule or mass lesion is present. No significant pleural effusion is present. Review of the MIP images confirms the above findings CTA HEAD FINDINGS Anterior circulation: The internal carotid arteries are within normal limits bilaterally from the skull base through the ICA termini. The right A1 segment is dominant. The anterior communicating artery is patent. MCA bifurcation is normal bilaterally. The ACA and MCA branch vessels are within normal limits. Posterior circulation: The vertebral arteries are codominant.  Vertebrobasilar junction is normal. PICA origins are visualized and within normal limits. Both posterior cerebral arteries originate from the basilar tip. There is mild irregularity of distal branch vessels without a significant proximal stenosis or occlusion. Venous sinuses: The dural sinuses are patent. Right transverse sinus is dominant. The straight sinus and deep cerebral veins are intact. Cortical veins are unremarkable. Anatomic variants: None Delayed phase: Post-contrast images demonstrate no pathologic enhancement. Review of the MIP images confirms the above findings IMPRESSION: 1. High-grade stenosis of the mid left common carotid artery measuring greater than 75%  relative to the more distal vessels. 2. Additional soft tissue plaque in the posterior wall of the proximal left internal carotid artery without focal stenosis. 3. Focal calcification in the posterior wall of the distal internal carotid artery bilaterally just below the skull base. This could be related to remote trauma. There is no lumen compromise associated. 4. No focal disease at the ophthalmic segment on the left. 5. Scattered white matter hypoattenuation bilaterally likely reflects the sequela of chronic microvascular ischemia. 6. No acute cortical infarct. These results were called by telephone at the time of interpretation on 11/04/2017 at 3:58 pm to [facility], [place] , who verbally acknowledged these results. Electronically Signed   By: San Morelle M.D.   On: 11/04/2017 16:02    ROS Blood pressure 119/77, pulse 73, temperature 98.8 F (37.1 C), temperature source Oral, resp. rate 20, height _0  (1.854 m), weight 84.7 kg (186 lb 11.7 oz), SpO2 100 %. Neurologic Examination:  Awake, alert, fully oriented. Language- fluent.  Comprehension, naming, repetition - normal Face symmetric.  Tongue midline. Strength 5/5 BUE and BLE.   Sees shadows occasionally OS, normal OD.   EOMI, PERL.  Sensation - intact  bilaterally. Coordination - intact bilaterally.  Assessment/Plan:  Left eye blindness gradually over 3 days.  Although a atheroembolism from the common carotid artery stenosis to the ophthalmic artery and central retinal artery is certainly possible, you would expect that to be sudden onset rather than insidious over 3 days.  It is possible that the GI bleed let to reduced perfusion through the CCA stenosis leading to ischemia downstream in the central retinal artery.    Vascular surgery to see regarding left CCA CEA vs stenting.  GI to address duodenal ulcer.  Hold antiplatelets for now until Hg is higher.    Recommend ophthalmological evaluation asap!  Rogue Jury, MD 11/05/2017, 1:51 AM

## 2017-11-05 NOTE — Plan of Care (Signed)
Initiated and progressing. 

## 2017-11-05 NOTE — Brief Op Note (Addendum)
No bleeding or blood products seen. Minimal Candida esophagitis. No ulcer seen (proximal stomach limited view due to food products). Change to Protonix 40 mg IV Q 12 hours. Diet recs per neurology. No plans for additional GI procedures at this time (needs outpt colonoscopy). Anemia likely multifactorial and cocaine use likely contributed to it. Start Nystatin swish and swallow for the Candida esophagitis when he is tolerating solid food. Will sign off. Call if questions.

## 2017-11-05 NOTE — Evaluation (Signed)
Speech Language Pathology Evaluation Patient Details Name: [name] MRN: [id number] DOB: 1956-03-30 Today's Date: 11/05/2017 Time: 0175-1025 SLP Time Calculation (min) (ACUTE ONLY): 32 min  Problem List:  Patient Active Problem List   Diagnosis Date Noted  . Vision loss of left eye 11/04/2017  . GIB (gastrointestinal bleeding) 11/04/2017  . Hypokalemia 11/04/2017  . Carotid artery stenosis 11/04/2017  . Tobacco dependence 05/30/2016  . Acute respiratory failure with hypercapnia ([place]) 07/02/2015  . Cocaine abuse ([place]) 07/02/2015  . Chronic obstructive pulmonary disease ([place])   . Essential hypertension   . Acute encephalopathy   . Cardiomyopathy, ischemic 09/04/2014  . Protein-calorie malnutrition, severe ([place]) 09/02/2014  . COPD exacerbation ([place]) 09/01/2014  . Acute respiratory failure ([facility]) 09/01/2014  . HTN (hypertension) 09/01/2014  . S/P CABG x 4 10/31/2013  . NSTEMI (non-ST elevated myocardial infarction) ([place]) 10/27/2013  . Pure hypercholesterolemia 10/27/2013  . Acute coronary syndrome ([place]) 10/26/2013  . Influenza A 05/19/2011  . Tobacco abuse 05/19/2011  . Acute bronchitis with chronic obstructive pulmonary disease (COPD) ([place]) 05/18/2011  . PUD (peptic ulcer disease) 05/18/2011   Past Medical History:  Past Medical History:  Diagnosis Date  . Anginal pain ([place])   . CAD (coronary artery disease)    4V CABG 10/31/13  . Cocaine abuse ([place])   . COPD (chronic obstructive pulmonary disease) ([place])   . GERD (gastroesophageal reflux disease)   . H/O: eczema    lower extremities  . H/O: GI bleed    a. 62 y/o ago, r/t ulcer. Denies recent GIB.  Marland Kitchen High cholesterol   . Hypertension   . Myocardial infarction ([place])   . Perforation of duodenal ulcer ([place])    a. 1970s - s/p surgery.   . Shortness of breath dyspnea   . Tobacco abuse    Past Surgical History:  Past Surgical History:  Procedure Laterality Date  . CORONARY ARTERY BYPASS GRAFT N/A 10/31/2013   Procedure: CORONARY ARTERY BYPASS GRAFTING TIMES FOUR ON PUMP USING LEFT INTERNAL MAMMARY ARTERY AND RIGHT GREATER SAPHENOUS VEIN VIA ENDOVEIN HARVEST.;  Surgeon: Melrose Nakayama, MD;  Location: [place] [facility];  Service: Open Heart Surgery;  Laterality: N/A;  . INTRAOPERATIVE TRANSESOPHAGEAL ECHOCARDIOGRAM N/A 10/31/2013   Procedure: INTRAOPERATIVE TRANSESOPHAGEAL ECHOCARDIOGRAM;  Surgeon: Melrose Nakayama, MD;  Location: [place];  Service: Open Heart Surgery;  Laterality: N/A;  . LEFT HEART CATHETERIZATION WITH CORONARY ANGIOGRAM N/A 10/28/2013   Procedure: LEFT HEART CATHETERIZATION WITH CORONARY ANGIOGRAM;  Surgeon: Burnell Blanks, MD;  Location: [facility] CATH LAB;  Service: Cardiovascular;  Laterality: N/A;  . stomach sx     HPI:  62 yo male adm with left eye blindness - gradual over few days and GI symptoms.  He now reports he can see shadows from left eye.  Pt found to have acute anemia with suspected upper GI bleed - associated with melena and coffee ground emesis.  Pt with central venous CT showed white matter hypoattenuation cerebellum, lacunar cva of anterior limb of internal capsule - undetermined time frame.  Left common carotid artery has some narrowing per imaging study.  Pt also has h/o smoking, marijuana and cocaine use.  Speech evaluation ordered as part of stroke work up.     Assessment / Plan / Recommendation Clinical Impression  Original MOCA given to pt with him scoring 25/30 with normal being 26 or above.  Pt with strengths in all areas tested except memory.  He was able to store words but had difficulty with retrieval  requiring multiple choice cues for 4/5 words - (last unable to identify).  SLP reviewed memory compensation strategies with pt verbally.  No dysarthria, aphasia or focal cn deficits impacting speech noted.  No SLP follow up indicated as suspect pt is at baseline given lack of acute event on neurological work up.  Thanks for this consult.     SLP Assessment  SLP  Recommendation/Assessment: Patient does not need any further Speech Lanaguage Pathology Services SLP Visit Diagnosis: Attention and concentration deficit Attention and concentration deficit following: Other cerebrovascular disease    Follow Up Recommendations  None    Frequency and Duration           SLP Evaluation Cognition  Overall Cognitive Status: No family/caregiver present to determine baseline cognitive functioning Arousal/Alertness: Awake/alert Orientation Level: Oriented X4 Attention: Selective Selective Attention: Appears intact Memory: Impaired Memory Impairment: Retrieval deficit(recalled 4/5 words with multiple choice - 1 did not recall) Awareness: Appears intact Problem Solving: Appears intact Safety/Judgment: Appears intact       Comprehension  Auditory Comprehension Overall Auditory Comprehension: Appears within functional limits for tasks assessed Yes/No Questions: Not tested Commands: Within Functional Limits Conversation: Complex Visual Recognition/Discrimination Discrimination: Within Function Limits Reading Comprehension Reading Status: Within funtional limits(for tasks tested)    Expression Expression Primary Mode of Expression: Verbal Verbal Expression Overall Verbal Expression: Appears within functional limits for tasks assessed Initiation: No impairment Repetition: No impairment Naming: No impairment Pragmatics: No impairment Non-Verbal Means of Communication: Not applicable Written Expression Dominant Hand: Right Written Expression: (able to draw clock and copy cube)   Oral / Motor  Oral Motor/Sensory Function Overall Oral Motor/Sensory Function: Within functional limits Motor Speech Overall Motor Speech: Appears within functional limits for tasks assessed Respiration: Within functional limits Resonance: Within functional limits Articulation: Within functional limitis Intelligibility: Intelligible Motor Planning: Witnin functional  limits   GO                    Macario Golds 11/05/2017, 10:47 AM Luanna Salk, [facility] SLP [phone]

## 2017-11-05 NOTE — Progress Notes (Signed)
PROGRESS NOTE                                                                                                                                                                                                             Patient Demographics:    [name], is a 62 y.o. male, DOB - 23-Jan-1956, DQQ:[id number]  Admit date - 11/04/2017   Admitting Physician Kerney Elbe, DO  Outpatient Primary MD for the patient is Dorena Dew, FNP  LOS - 1   Chief Complaint  Patient presents with  . Eye Problem       Brief Narrative   62 y.o. male with medical history significant of CAD status post four-vessel CABG, COPD, history of GERD and history of GI bleed as well as perforation of duodenal ulcer, resents with rapid onset left eye blindness, work-up in ED significant for anemia with hemoglobin of 4.3, has significant left carotid artery disease, anoscopy with no evidence of active GI bleed, give 3 units PRBC, another unit pending, he was seen by neurology, vascular surgery and GI.    Subjective:    [name] today has, No headache, No chest pain, No abdominal pain -   Assessment  & Plan :    Active Problems:   PUD (peptic ulcer disease)   Tobacco abuse   Pure hypercholesterolemia   S/P CABG x 4   HTN (hypertension)   Cardiomyopathy, ischemic   Chronic obstructive pulmonary disease (HCC)   Cocaine abuse (HCC)   Tobacco dependence   Vision loss of left eye   GIB (gastrointestinal bleeding)   Hypokalemia   Carotid artery stenosis   Left eye vision loss/venous -The onset, per patient over 2 to 3 days, for unclear etiology, most likely related to atheroembolism/internal thrombosis from recurrent left carotid artery disease, versus perfusion status from wound anemia. -Ophthalmology has been consulted, Dr. Alanda Slim to evaluate the patient today. -PT/OT consulted -CRP within normal limit  Acute blood loss anemia/iron deficiency  anemia -Multifactorial, most likely blood loss anemia from GI bleed, he had endoscopy done today by GI, no evidence of active bleed(part of proximal fundus could not be evaluated given residual food),. -Globin 4.3 on presentation, so far received 3 units PRBC, another unit pending. -Low iron, ferritin, J94 and folic acid, will replete all. -D/W with GI, patient is clear  to start on blood thinners once appropriate  Carotid artery stenosis -Vascular surgery input appreciated, will need L CEA + CCA stenting he has no artery occlusion, but will need CCA stenting if no evidence of retinal artery occlusion(hypoperfusion from anemia_   Hypokalemia -repleted  Polysubstance Abuse including Cocaine and THC -Patient admits to me that he did cocaine last week and did marijuana last night trying to increase his appetite - counseled  Tobacco Abuse -Smoking cessation counseling given -Continue with nicotine patch 14 mg transdermally every 24 hours  EtOH Abuse/Concern for EtOH Withdrawal -Patient reports drinking couple nights a week, continue with thiamine and folic acid, on CIWA protocol, no evidence of withdrawals  HLD -LDL is 42 -Continue with atorvastatin 80 mg p.o. nightly  HTN -Pressure is soft  Hx of MI/CAD s/p CABG -Patient has had a quadruple bypass in 2015 and history of MI -Takes aspirin 325 mg p.o. daily as well as atorvastatin 80 mg nightly -Resume aspirin tomorrow if hemoglobin stable  GERD  -Protonix drip  COPD -continue with home medications, no active wheezing,.        Code Status : Full  Family Communication  : none at bedside  Disposition Plan  : pending further work up.  Consults  : GI, neurology, vascular surgery, ophthalmology  Procedures  : EGD Dr. Michail Sermon 11/05/2017  DVT Prophylaxis  :  SCD  Lab Results  Component Value Date   PLT 305 11/05/2017    Antibiotics  :    Anti-infectives (From admission, onward)   None         Objective:   Vitals:   11/05/17 1200 11/05/17 1205 11/05/17 1210 11/05/17 1230  BP: (!) 110/46  (!) 108/53 104/61  Pulse: 68 66 65 65  Resp: (!) 22 20 19 16   Temp:    98.8 F (37.1 C)  TempSrc:    Oral  SpO2: 100% 100% 100% 100%  Weight:      Height:        Wt Readings from Last 3 Encounters:  11/05/17 84.7 kg (186 lb 11.7 oz)  06/28/17 88.5 kg (195 lb)  11/28/16 88 kg (194 lb)     Intake/Output Summary (Last 24 hours) at 11/05/2017 1341 Last data filed at 11/05/2017 1141 Gross per 24 hour  Intake 1697.5 ml  Output 400 ml  Net 1297.5 ml     Physical Exam  Awake Alert, Oriented X 3, Symmetrical Chest wall movement, Good air movement bilaterally, CTAB RRR,No Gallops,Rubs or new Murmurs, No Parasternal Heave +ve B.Sounds, Abd Soft, No tenderness,  No rebound - guarding or rigidity. No Cyanosis, Clubbing or edema, No new Rash or bruise      Data Review:    CBC Recent Labs  Lab 11/04/17 1353 11/04/17 1502 11/05/17 0253  WBC 6.6  --  7.1  HGB 4.7* 4.3* 7.2*  HCT 15.4* 14.4* 23.0*  PLT 339  --  305  MCV 74.4*  --  79.6  MCH 22.7*  --  24.9*  MCHC 30.5  --  31.3  RDW 21.6*  --  20.4*  LYMPHSABS 1.7  --   --   MONOABS 0.7  --   --   EOSABS 0.4  --   --   BASOSABS 0.1  --   --     Chemistries  Recent Labs  Lab 11/04/17 1353 11/05/17 0253  NA 136 135  K 2.9* 3.5  CL 101 104  CO2 24 22  GLUCOSE 97 86  BUN  11 7  CREATININE 1.05 0.95  CALCIUM 8.5* 8.4*  AST  --  16  ALT  --  11*  ALKPHOS  --  113  BILITOT  --  2.0*   ------------------------------------------------------------------------------------------------------------------ Recent Labs    11/05/17 0253  CHOL 97  HDL 44  LDLCALC 42  TRIG 57  CHOLHDL 2.2    Lab Results  Component Value Date   HGBA1C 5.2 11/05/2017   ------------------------------------------------------------------------------------------------------------------ Recent Labs    11/05/17 0253  TSH 0.478    ------------------------------------------------------------------------------------------------------------------ Recent Labs    11/04/17 1549  VITAMINB12 274  FOLATE 6.6  FERRITIN 4*  TIBC 479*  IRON 5*  RETICCTPCT 2.2    Coagulation profile Recent Labs  Lab 11/04/17 1353 11/05/17 0253  INR 0.93 1.01    No results for input(s): DDIMER in the last 72 hours.  Cardiac Enzymes No results for input(s): CKMB, TROPONINI, MYOGLOBIN in the last 168 hours.  Invalid input(s): CK ------------------------------------------------------------------------------------------------------------------    Component Value Date/Time   BNP 178.8 (H) 07/02/2015 1134    Inpatient Medications  Scheduled Meds: . aspirin EC  81 mg Oral Daily  . atorvastatin  80 mg Oral q1800  . fluticasone furoate-vilanterol  1 puff Inhalation Daily  . folic acid  1 mg Oral Daily  . multivitamin with minerals  1 tablet Oral Daily  . nicotine  14 mg Transdermal Daily  . [START ON 11/08/2017] pantoprazole  40 mg Intravenous Q12H  . thiamine  100 mg Oral Daily   Continuous Infusions: . 0.9 % NaCl with KCl 40 mEq / L 75 mL/hr (11/05/17 0436)  . pantoprozole (PROTONIX) infusion 8 mg/hr (11/05/17 1008)   PRN Meds:.acetaminophen **OR** acetaminophen, albuterol, fluticasone, LORazepam, ondansetron **OR** ondansetron (ZOFRAN) IV, polyethylene glycol, traMADol  Micro Results Recent Results (from the past 240 hour(s))  MRSA PCR Screening     Status: Abnormal   Collection Time: 11/05/17  2:55 AM  Result Value Ref Range Status   MRSA by PCR INVALID RESULTS, SPECIMEN SENT FOR CULTURE (A) NEGATIVE Final    Comment:        The GeneXpert MRSA Assay (FDA approved for NASAL specimens only), is one component of a comprehensive MRSA colonization surveillance program. It is not intended to diagnose MRSA infection nor to guide or monitor treatment for MRSA infections.     Radiology Reports Ct Angio Head W Or Wo  Contrast  Result Date: 11/04/2017 CLINICAL DATA:  New onset of vision loss in the left eye. The patient has no perception of light in the left eye. He began to have diminished vision in the left eye last evening. He awoke with no vision in the left eye today. EXAM: CT ANGIOGRAPHY HEAD AND NECK TECHNIQUE: Multidetector CT imaging of the head and neck was performed using the standard protocol during bolus administration of intravenous contrast. Multiplanar CT image reconstructions and MIPs were obtained to evaluate the vascular anatomy. Carotid stenosis measurements (when applicable) are obtained utilizing NASCET criteria, using the distal internal carotid diameter as the denominator. CONTRAST:  150mL ISOVUE-370 IOPAMIDOL (ISOVUE-370) INJECTION 76% COMPARISON:  None. FINDINGS: CT HEAD FINDINGS Brain: Noncontrast imaging the brain demonstrates scattered periventricular white matter hypoattenuation bilaterally. In age indeterminate lacunar infarct is present within the anterior limb of the left internal capsule. A ridging is white matter hypoattenuation is present in the inferior cerebellum bilaterally. No acute hemorrhage or mass lesion is present. The ventricles are of normal size. No significant extra-axial fluid collection is present. Vascular: No hyperdense vessel  or unexpected calcification. Skull: Calvarium is intact. No focal lytic or blastic lesions are present. Sinuses: The paranasal sinuses and mastoid air cells are clear. Orbits: Bilateral globes and orbits are within normal limits. Review of the MIP images confirms the above findings CTA NECK FINDINGS Aortic arch: A 3 vessel arch configuration is present. Atherosclerotic calcifications are present without significant stenosis at the origins the great vessels. There is no aneurysm. Right carotid system: The right common carotid artery is within normal limits. Mild tortuosity is present in the proximal cervical right ICA. There is focal calcification along  the posterior wall of the distal right ICA. No stenosis is present. Left carotid system: The left common carotid artery is within normal limits proximally. There is focal stenosis of the mid left common carotid artery. The lumen is narrowed to 1.6 mm. This compares with a more distal segment of 6.5 mm. Soft tissue plaque is noted along the posterior aspect of the left carotid bulb without significant luminal stenosis. Focal calcification is present along the posterior wall of the distal left internal carotid artery just below the skull base without significant stenosis. Vertebral arteries: The vertebral arteries are codominant. Both vertebral arteries originate from the subclavian arteries without significant stenoses. There is no significant stenosis of either vertebral artery in the neck. Skeleton: Focal endplate changes are present with uncovertebral spurring at C6-7. Bilateral osseous foraminal narrowing is worse on the right. Vertebral body heights and alignment are otherwise normal. The patient is status post median sternotomy. An unerupted right maxillary and I tooth is present. An adjacent hypoplastic on are up to tooth is present. The patient is otherwise edentulous along the maxilla. Other neck: Soft tissue lipoma is present along the left occipital scalp. The soft tissues of the neck are unremarkable otherwise. Thyroid is within normal limits. Salivary glands are unremarkable. No significant adenopathy is present. Upper chest: Centrilobular emphysematous changes are present. No focal nodule or mass lesion is present. No significant pleural effusion is present. Review of the MIP images confirms the above findings CTA HEAD FINDINGS Anterior circulation: The internal carotid arteries are within normal limits bilaterally from the skull base through the ICA termini. The right A1 segment is dominant. The anterior communicating artery is patent. MCA bifurcation is normal bilaterally. The ACA and MCA branch  vessels are within normal limits. Posterior circulation: The vertebral arteries are codominant. Vertebrobasilar junction is normal. PICA origins are visualized and within normal limits. Both posterior cerebral arteries originate from the basilar tip. There is mild irregularity of distal branch vessels without a significant proximal stenosis or occlusion. Venous sinuses: The dural sinuses are patent. Right transverse sinus is dominant. The straight sinus and deep cerebral veins are intact. Cortical veins are unremarkable. Anatomic variants: None Delayed phase: Post-contrast images demonstrate no pathologic enhancement. Review of the MIP images confirms the above findings IMPRESSION: 1. High-grade stenosis of the mid left common carotid artery measuring greater than 75% relative to the more distal vessels. 2. Additional soft tissue plaque in the posterior wall of the proximal left internal carotid artery without focal stenosis. 3. Focal calcification in the posterior wall of the distal internal carotid artery bilaterally just below the skull base. This could be related to remote trauma. There is no lumen compromise associated. 4. No focal disease at the ophthalmic segment on the left. 5. Scattered white matter hypoattenuation bilaterally likely reflects the sequela of chronic microvascular ischemia. 6. No acute cortical infarct. These results were called by telephone at the time  of interpretation on 11/04/2017 at 3:58 pm to [facility] , who verbally acknowledged these results. Electronically Signed   By: San Morelle M.D.   On: 11/04/2017 16:02   Ct Head Wo Contrast  Result Date: 11/04/2017 CLINICAL DATA:  New onset of vision loss in the left eye. The patient has no perception of light in the left eye. He began to have diminished vision in the left eye last evening. He awoke with no vision in the left eye today. EXAM: CT ANGIOGRAPHY HEAD AND NECK TECHNIQUE: Multidetector CT imaging of the head  and neck was performed using the standard protocol during bolus administration of intravenous contrast. Multiplanar CT image reconstructions and MIPs were obtained to evaluate the vascular anatomy. Carotid stenosis measurements (when applicable) are obtained utilizing NASCET criteria, using the distal internal carotid diameter as the denominator. CONTRAST:  168mL ISOVUE-370 IOPAMIDOL (ISOVUE-370) INJECTION 76% COMPARISON:  None. FINDINGS: CT HEAD FINDINGS Brain: Noncontrast imaging the brain demonstrates scattered periventricular white matter hypoattenuation bilaterally. In age indeterminate lacunar infarct is present within the anterior limb of the left internal capsule. A ridging is white matter hypoattenuation is present in the inferior cerebellum bilaterally. No acute hemorrhage or mass lesion is present. The ventricles are of normal size. No significant extra-axial fluid collection is present. Vascular: No hyperdense vessel or unexpected calcification. Skull: Calvarium is intact. No focal lytic or blastic lesions are present. Sinuses: The paranasal sinuses and mastoid air cells are clear. Orbits: Bilateral globes and orbits are within normal limits. Review of the MIP images confirms the above findings CTA NECK FINDINGS Aortic arch: A 3 vessel arch configuration is present. Atherosclerotic calcifications are present without significant stenosis at the origins the great vessels. There is no aneurysm. Right carotid system: The right common carotid artery is within normal limits. Mild tortuosity is present in the proximal cervical right ICA. There is focal calcification along the posterior wall of the distal right ICA. No stenosis is present. Left carotid system: The left common carotid artery is within normal limits proximally. There is focal stenosis of the mid left common carotid artery. The lumen is narrowed to 1.6 mm. This compares with a more distal segment of 6.5 mm. Soft tissue plaque is noted along the  posterior aspect of the left carotid bulb without significant luminal stenosis. Focal calcification is present along the posterior wall of the distal left internal carotid artery just below the skull base without significant stenosis. Vertebral arteries: The vertebral arteries are codominant. Both vertebral arteries originate from the subclavian arteries without significant stenoses. There is no significant stenosis of either vertebral artery in the neck. Skeleton: Focal endplate changes are present with uncovertebral spurring at C6-7. Bilateral osseous foraminal narrowing is worse on the right. Vertebral body heights and alignment are otherwise normal. The patient is status post median sternotomy. An unerupted right maxillary and I tooth is present. An adjacent hypoplastic on are up to tooth is present. The patient is otherwise edentulous along the maxilla. Other neck: Soft tissue lipoma is present along the left occipital scalp. The soft tissues of the neck are unremarkable otherwise. Thyroid is within normal limits. Salivary glands are unremarkable. No significant adenopathy is present. Upper chest: Centrilobular emphysematous changes are present. No focal nodule or mass lesion is present. No significant pleural effusion is present. Review of the MIP images confirms the above findings CTA HEAD FINDINGS Anterior circulation: The internal carotid arteries are within normal limits bilaterally from the skull base through the ICA termini.  The right A1 segment is dominant. The anterior communicating artery is patent. MCA bifurcation is normal bilaterally. The ACA and MCA branch vessels are within normal limits. Posterior circulation: The vertebral arteries are codominant. Vertebrobasilar junction is normal. PICA origins are visualized and within normal limits. Both posterior cerebral arteries originate from the basilar tip. There is mild irregularity of distal branch vessels without a significant proximal stenosis or  occlusion. Venous sinuses: The dural sinuses are patent. Right transverse sinus is dominant. The straight sinus and deep cerebral veins are intact. Cortical veins are unremarkable. Anatomic variants: None Delayed phase: Post-contrast images demonstrate no pathologic enhancement. Review of the MIP images confirms the above findings IMPRESSION: 1. High-grade stenosis of the mid left common carotid artery measuring greater than 75% relative to the more distal vessels. 2. Additional soft tissue plaque in the posterior wall of the proximal left internal carotid artery without focal stenosis. 3. Focal calcification in the posterior wall of the distal internal carotid artery bilaterally just below the skull base. This could be related to remote trauma. There is no lumen compromise associated. 4. No focal disease at the ophthalmic segment on the left. 5. Scattered white matter hypoattenuation bilaterally likely reflects the sequela of chronic microvascular ischemia. 6. No acute cortical infarct. These results were called by telephone at the time of interpretation on 11/04/2017 at 3:58 pm to [facility], [place] , who verbally acknowledged these results. Electronically Signed   By: San Morelle M.D.   On: 11/04/2017 16:02   Ct Angio Neck W And/or Wo Contrast  Result Date: 11/04/2017 CLINICAL DATA:  New onset of vision loss in the left eye. The patient has no perception of light in the left eye. He began to have diminished vision in the left eye last evening. He awoke with no vision in the left eye today. EXAM: CT ANGIOGRAPHY HEAD AND NECK TECHNIQUE: Multidetector CT imaging of the head and neck was performed using the standard protocol during bolus administration of intravenous contrast. Multiplanar CT image reconstructions and MIPs were obtained to evaluate the vascular anatomy. Carotid stenosis measurements (when applicable) are obtained utilizing NASCET criteria, using the distal internal carotid diameter as  the denominator. CONTRAST:  129mL ISOVUE-370 IOPAMIDOL (ISOVUE-370) INJECTION 76% COMPARISON:  None. FINDINGS: CT HEAD FINDINGS Brain: Noncontrast imaging the brain demonstrates scattered periventricular white matter hypoattenuation bilaterally. In age indeterminate lacunar infarct is present within the anterior limb of the left internal capsule. A ridging is white matter hypoattenuation is present in the inferior cerebellum bilaterally. No acute hemorrhage or mass lesion is present. The ventricles are of normal size. No significant extra-axial fluid collection is present. Vascular: No hyperdense vessel or unexpected calcification. Skull: Calvarium is intact. No focal lytic or blastic lesions are present. Sinuses: The paranasal sinuses and mastoid air cells are clear. Orbits: Bilateral globes and orbits are within normal limits. Review of the MIP images confirms the above findings CTA NECK FINDINGS Aortic arch: A 3 vessel arch configuration is present. Atherosclerotic calcifications are present without significant stenosis at the origins the great vessels. There is no aneurysm. Right carotid system: The right common carotid artery is within normal limits. Mild tortuosity is present in the proximal cervical right ICA. There is focal calcification along the posterior wall of the distal right ICA. No stenosis is present. Left carotid system: The left common carotid artery is within normal limits proximally. There is focal stenosis of the mid left common carotid artery. The lumen is narrowed to 1.6 mm. This  compares with a more distal segment of 6.5 mm. Soft tissue plaque is noted along the posterior aspect of the left carotid bulb without significant luminal stenosis. Focal calcification is present along the posterior wall of the distal left internal carotid artery just below the skull base without significant stenosis. Vertebral arteries: The vertebral arteries are codominant. Both vertebral arteries originate from  the subclavian arteries without significant stenoses. There is no significant stenosis of either vertebral artery in the neck. Skeleton: Focal endplate changes are present with uncovertebral spurring at C6-7. Bilateral osseous foraminal narrowing is worse on the right. Vertebral body heights and alignment are otherwise normal. The patient is status post median sternotomy. An unerupted right maxillary and I tooth is present. An adjacent hypoplastic on are up to tooth is present. The patient is otherwise edentulous along the maxilla. Other neck: Soft tissue lipoma is present along the left occipital scalp. The soft tissues of the neck are unremarkable otherwise. Thyroid is within normal limits. Salivary glands are unremarkable. No significant adenopathy is present. Upper chest: Centrilobular emphysematous changes are present. No focal nodule or mass lesion is present. No significant pleural effusion is present. Review of the MIP images confirms the above findings CTA HEAD FINDINGS Anterior circulation: The internal carotid arteries are within normal limits bilaterally from the skull base through the ICA termini. The right A1 segment is dominant. The anterior communicating artery is patent. MCA bifurcation is normal bilaterally. The ACA and MCA branch vessels are within normal limits. Posterior circulation: The vertebral arteries are codominant. Vertebrobasilar junction is normal. PICA origins are visualized and within normal limits. Both posterior cerebral arteries originate from the basilar tip. There is mild irregularity of distal branch vessels without a significant proximal stenosis or occlusion. Venous sinuses: The dural sinuses are patent. Right transverse sinus is dominant. The straight sinus and deep cerebral veins are intact. Cortical veins are unremarkable. Anatomic variants: None Delayed phase: Post-contrast images demonstrate no pathologic enhancement. Review of the MIP images confirms the above findings  IMPRESSION: 1. High-grade stenosis of the mid left common carotid artery measuring greater than 75% relative to the more distal vessels. 2. Additional soft tissue plaque in the posterior wall of the proximal left internal carotid artery without focal stenosis. 3. Focal calcification in the posterior wall of the distal internal carotid artery bilaterally just below the skull base. This could be related to remote trauma. There is no lumen compromise associated. 4. No focal disease at the ophthalmic segment on the left. 5. Scattered white matter hypoattenuation bilaterally likely reflects the sequela of chronic microvascular ischemia. 6. No acute cortical infarct. These results were called by telephone at the time of interpretation on 11/04/2017 at 3:58 pm to [facility], [place] , who verbally acknowledged these results. Electronically Signed   By: San Morelle M.D.   On: 11/04/2017 16:02      Phillips Climes M.D on 11/05/2017 at 1:41 PM  Between 7am to 7pm - Pager - [phone]  After 7pm go to [email or web address] - password [email or web address]  [facility] -  Office  [phone]

## 2017-11-05 NOTE — H&P (View-Only) (Signed)
Referring Provider: Dr. Alfredia Ferguson Primary Care Physician:  Dorena Dew, FNP Primary Gastroenterologist:  Althia Forts  Reason for Consultation:  GI bleed; Anemia  HPI: [name] is a 62 y.o. male presented to [facility] ER with left vision loss, shortness of breath and abdominal pain. Has been having intermittent black stools for the last few weeks stating that he has had a few black stools during that time but last episode was over a week ago. Occasional black vomitus over the last 2 weeks. Has intermittent sharp abdominal pain for the past few weeks as well. Denies dizziness or lightheadedness. Lost vision in his left eye for the past 2 days. Remote history of duodenal ulcer with surgery (reports patching done) in the early 1970's. Hgb 4.3 yesterday and after 2 U PRBCs Hgb 7.2 today. Occasional cocaine use and last used on 11/03/17. Drinks 1-2 mixed drinks and 1-2 beers per week. Also smokes marijuana. On daily Aspirin 325 mg/day and denies other NSAIDs.  Past Medical History:  Diagnosis Date  . Anginal pain ([place])   . CAD (coronary artery disease)    4V CABG 10/31/13  . Cocaine abuse ([place])   . COPD (chronic obstructive pulmonary disease) ([place])   . GERD (gastroesophageal reflux disease)   . H/O: eczema    lower extremities  . H/O: GI bleed    a. 62 y/o ago, r/t ulcer. Denies recent GIB.  Marland Kitchen High cholesterol   . Hypertension   . Myocardial infarction ([place])   . Perforation of duodenal ulcer ([place])    a. 1970s - s/p surgery.   . Shortness of breath dyspnea   . Tobacco abuse     Past Surgical History:  Procedure Laterality Date  . CORONARY ARTERY BYPASS GRAFT N/A 10/31/2013   Procedure: CORONARY ARTERY BYPASS GRAFTING TIMES FOUR ON PUMP USING LEFT INTERNAL MAMMARY ARTERY AND RIGHT GREATER SAPHENOUS VEIN VIA ENDOVEIN HARVEST.;  Surgeon: Melrose Nakayama, MD;  Location: [place];  Service: Open Heart Surgery;  Laterality: N/A;  . INTRAOPERATIVE TRANSESOPHAGEAL ECHOCARDIOGRAM N/A 10/31/2013    Procedure: INTRAOPERATIVE TRANSESOPHAGEAL ECHOCARDIOGRAM;  Surgeon: Melrose Nakayama, MD;  Location: [place];  Service: Open Heart Surgery;  Laterality: N/A;  . LEFT HEART CATHETERIZATION WITH CORONARY ANGIOGRAM N/A 10/28/2013   Procedure: LEFT HEART CATHETERIZATION WITH CORONARY ANGIOGRAM;  Surgeon: Burnell Blanks, MD;  Location: [facility] CATH LAB;  Service: Cardiovascular;  Laterality: N/A;  . stomach sx      Prior to Admission medications   Medication Sig Start Date End Date Taking? Authorizing Provider  aspirin 325 MG EC tablet Take 1 tablet (325 mg total) by mouth daily. 11/19/15  Yes Dorena Dew, FNP  atorvastatin (LIPITOR) 80 MG tablet TAKE 1 TABLET BY MOUTH DAILY AT 6 PM. 08/30/17  Yes Dorena Dew, FNP  fluticasone [facility]) 50 MCG/ACT nasal spray Place 2 sprays into both nostrils as needed for allergies or rhinitis.   Yes [provider]  fluticasone furoate-vilanterol (BREO ELLIPTA) 200-25 MCG/INH AEPB Inhale 1 puff into the lungs daily. 01/27/17  Yes Jegede, Olugbemiga E, MD  hydrochlorothiazide (MICROZIDE) 12.5 MG capsule TAKE 1 CAPSULE BY MOUTH DAILY. 08/30/17  Yes Dorena Dew, FNP  triamcinolone (KENALOG) 0.025 % ointment APPLY 1 APPLICATION TOPICALLY 2 TIMES DAILY. Patient taking differently: APPLY 1 APPLICATION TOPICALLY 2 TIMES qd prn rash 10/20/16  Yes Dorena Dew, FNP  VENTOLIN HFA 108 (90 Base) MCG/ACT inhaler INHALE 2 PUFFS INTO THE LUNGS EVERY 6 HOURS AS NEEDED FOR WHEEZING OR  SHORTNESS OF BREATH. 10/10/17  Yes Dorena Dew, FNP  albuterol (PROVENTIL HFA;VENTOLIN HFA) 108 (90 Base) MCG/ACT inhaler Inhale 2 puffs into the lungs every 6 (six) hours as needed for wheezing or shortness of breath. Patient not taking: Reported on 06/28/2017 01/27/17   Tresa Garter, MD  budesonide-formoterol [facility]) 160-4.5 MCG/ACT inhaler Inhale 2 puffs into the lungs 2 (two) times daily. Patient not taking: Reported on 06/28/2017 11/28/16   Dorena Dew, FNP  hydrochlorothiazide (MICROZIDE) 12.5 MG capsule TAKE 1 CAPSULE BY MOUTH DAILY. Patient not taking: Reported on 11/04/2017 01/13/17   Dorena Dew, FNP  hydrochlorothiazide (MICROZIDE) 12.5 MG capsule TAKE 1 CAPSULE BY MOUTH DAILY. Patient not taking: Reported on 11/04/2017 08/30/17   Dorena Dew, FNP  nicotine (NICODERM CQ) 14 mg/24hr patch Place 1 patch (14 mg total) onto the skin daily. Patient not taking: Reported on 06/28/2017 11/28/16   Dorena Dew, FNP  SYMBICORT 160-4.5 MCG/ACT inhaler INHALE 2 PUFFS INTO THE LUNGS 2 TIMES DAILY. Patient not taking: Reported on 11/04/2017 03/16/17   Dorena Dew, FNP    Scheduled Meds: . atorvastatin  80 mg Oral q1800  . fluticasone furoate-vilanterol  1 puff Inhalation Daily  . folic acid  1 mg Oral Daily  . multivitamin with minerals  1 tablet Oral Daily  . nicotine  14 mg Transdermal Daily  . [START ON 11/08/2017] pantoprazole  40 mg Intravenous Q12H  . thiamine  100 mg Oral Daily   Continuous Infusions: . sodium chloride    . 0.9 % NaCl with KCl 40 mEq / L 75 mL/hr (11/05/17 0436)  . pantoprazole (PROTONIX) IVPB    . pantoprozole (PROTONIX) infusion     PRN Meds:.acetaminophen **OR** acetaminophen, albuterol, fluticasone, LORazepam, ondansetron **OR** ondansetron (ZOFRAN) IV, polyethylene glycol, traMADol  Allergies as of 11/04/2017  . (No Known Allergies)    Family History  Problem Relation Age of Onset  . Lung cancer Unknown     Social History   Socioeconomic History  . Marital status: Single    Spouse name: Not on file  . Number of children: Not on file  . Years of education: Not on file  . Highest education level: Not on file  Occupational History  . Not on file  Social Needs  . Financial resource strain: Not on file  . Food insecurity:    Worry: Not on file    Inability: Not on file  . Transportation needs:    Medical: Not on file    Non-medical: Not on file  Tobacco Use  . Smoking status:  Current Every Day Smoker    Packs/day: 0.50    Years: 40.00    Pack years: 20.00    Types: Cigarettes  . Smokeless tobacco: Never Used  Substance and Sexual Activity  . Alcohol use: Yes    Alcohol/week: 1.2 oz    Types: 2 Cans of beer per week    Comment: 6 pack over the weekend   . Drug use: Yes    Types: Marijuana, Cocaine    Comment: "about 1 marijuana cigarette a week"  . Sexual activity: Yes    Birth control/protection: None  Lifestyle  . Physical activity:    Days per week: Not on file    Minutes per session: Not on file  . Stress: Not on file  Relationships  . Social connections:    Talks on phone: Not on file    Gets together: Not on file  Attends religious service: Not on file    Active member of club or organization: Not on file    Attends meetings of clubs or organizations: Not on file    Relationship status: Not on file  . Intimate partner violence:    Fear of current or ex partner: Not on file    Emotionally abused: Not on file    Physically abused: Not on file    Forced sexual activity: Not on file  Other Topics Concern  . Not on file  Social History Narrative  . Not on file    Review of Systems: All negative except as stated above in HPI.  Physical Exam: Vital signs: Vitals:   11/05/17 0800 11/05/17 0907  BP: (!) 94/55   Pulse:  70  Resp: 20 14  Temp: 98.3 F (36.8 C)   SpO2: 100% 100%    Last BM Date: 11/03/17 General:   Alert,  Well-developed, well-nourished, pleasant and cooperative in NAD Head: normocephalic, atraumatic Eyes: anicteric sclera ENT: oropharynx clear Neck: supple, nontender Lungs:  Clear throughout to auscultation.   No wheezes, crackles, or rhonchi. No acute distress. Heart:  Regular rate and rhythm; no murmurs, clicks, rubs,  or gallops. Abdomen: soft, nontender, nondistended, +BS  Rectal:  Deferred Ext: no edema  GI:  Lab Results: Recent Labs    11/04/17 1353 11/04/17 1502 11/05/17 0253  WBC 6.6  --  7.1   HGB 4.7* 4.3* 7.2*  HCT 15.4* 14.4* 23.0*  PLT 339  --  305   BMET Recent Labs    11/04/17 1353 11/05/17 0253  NA 136 135  K 2.9* 3.5  CL 101 104  CO2 24 22  GLUCOSE 97 86  BUN 11 7  CREATININE 1.05 0.95  CALCIUM 8.5* 8.4*   LFT Recent Labs    11/05/17 0253  PROT 6.0*  ALBUMIN 3.2*  AST 16  ALT 11*  ALKPHOS 113  BILITOT 2.0*   PT/INR Recent Labs    11/04/17 1353 11/05/17 0253  LABPROT 12.4 13.2  INR 0.93 1.01     Studies/Results: Ct Angio Head W Or Wo Contrast  Result Date: 11/04/2017 CLINICAL DATA:  New onset of vision loss in the left eye. The patient has no perception of light in the left eye. He began to have diminished vision in the left eye last evening. He awoke with no vision in the left eye today. EXAM: CT ANGIOGRAPHY HEAD AND NECK TECHNIQUE: Multidetector CT imaging of the head and neck was performed using the standard protocol during bolus administration of intravenous contrast. Multiplanar CT image reconstructions and MIPs were obtained to evaluate the vascular anatomy. Carotid stenosis measurements (when applicable) are obtained utilizing NASCET criteria, using the distal internal carotid diameter as the denominator. CONTRAST:  186mL ISOVUE-370 IOPAMIDOL (ISOVUE-370) INJECTION 76% COMPARISON:  None. FINDINGS: CT HEAD FINDINGS Brain: Noncontrast imaging the brain demonstrates scattered periventricular white matter hypoattenuation bilaterally. In age indeterminate lacunar infarct is present within the anterior limb of the left internal capsule. A ridging is white matter hypoattenuation is present in the inferior cerebellum bilaterally. No acute hemorrhage or mass lesion is present. The ventricles are of normal size. No significant extra-axial fluid collection is present. Vascular: No hyperdense vessel or unexpected calcification. Skull: Calvarium is intact. No focal lytic or blastic lesions are present. Sinuses: The paranasal sinuses and mastoid air cells are  clear. Orbits: Bilateral globes and orbits are within normal limits. Review of the MIP images confirms the above findings CTA NECK  FINDINGS Aortic arch: A 3 vessel arch configuration is present. Atherosclerotic calcifications are present without significant stenosis at the origins the great vessels. There is no aneurysm. Right carotid system: The right common carotid artery is within normal limits. Mild tortuosity is present in the proximal cervical right ICA. There is focal calcification along the posterior wall of the distal right ICA. No stenosis is present. Left carotid system: The left common carotid artery is within normal limits proximally. There is focal stenosis of the mid left common carotid artery. The lumen is narrowed to 1.6 mm. This compares with a more distal segment of 6.5 mm. Soft tissue plaque is noted along the posterior aspect of the left carotid bulb without significant luminal stenosis. Focal calcification is present along the posterior wall of the distal left internal carotid artery just below the skull base without significant stenosis. Vertebral arteries: The vertebral arteries are codominant. Both vertebral arteries originate from the subclavian arteries without significant stenoses. There is no significant stenosis of either vertebral artery in the neck. Skeleton: Focal endplate changes are present with uncovertebral spurring at C6-7. Bilateral osseous foraminal narrowing is worse on the right. Vertebral body heights and alignment are otherwise normal. The patient is status post median sternotomy. An unerupted right maxillary and I tooth is present. An adjacent hypoplastic on are up to tooth is present. The patient is otherwise edentulous along the maxilla. Other neck: Soft tissue lipoma is present along the left occipital scalp. The soft tissues of the neck are unremarkable otherwise. Thyroid is within normal limits. Salivary glands are unremarkable. No significant adenopathy is present.  Upper chest: Centrilobular emphysematous changes are present. No focal nodule or mass lesion is present. No significant pleural effusion is present. Review of the MIP images confirms the above findings CTA HEAD FINDINGS Anterior circulation: The internal carotid arteries are within normal limits bilaterally from the skull base through the ICA termini. The right A1 segment is dominant. The anterior communicating artery is patent. MCA bifurcation is normal bilaterally. The ACA and MCA branch vessels are within normal limits. Posterior circulation: The vertebral arteries are codominant. Vertebrobasilar junction is normal. PICA origins are visualized and within normal limits. Both posterior cerebral arteries originate from the basilar tip. There is mild irregularity of distal branch vessels without a significant proximal stenosis or occlusion. Venous sinuses: The dural sinuses are patent. Right transverse sinus is dominant. The straight sinus and deep cerebral veins are intact. Cortical veins are unremarkable. Anatomic variants: None Delayed phase: Post-contrast images demonstrate no pathologic enhancement. Review of the MIP images confirms the above findings IMPRESSION: 1. High-grade stenosis of the mid left common carotid artery measuring greater than 75% relative to the more distal vessels. 2. Additional soft tissue plaque in the posterior wall of the proximal left internal carotid artery without focal stenosis. 3. Focal calcification in the posterior wall of the distal internal carotid artery bilaterally just below the skull base. This could be related to remote trauma. There is no lumen compromise associated. 4. No focal disease at the ophthalmic segment on the left. 5. Scattered white matter hypoattenuation bilaterally likely reflects the sequela of chronic microvascular ischemia. 6. No acute cortical infarct. These results were called by telephone at the time of interpretation on 11/04/2017 at 3:58 pm to [place], [place] , who verbally acknowledged these results. Electronically Signed   By: San Morelle M.D.   On: 11/04/2017 16:02   Ct Head Wo Contrast  Result Date: 11/04/2017 CLINICAL DATA:  New  onset of vision loss in the left eye. The patient has no perception of light in the left eye. He began to have diminished vision in the left eye last evening. He awoke with no vision in the left eye today. EXAM: CT ANGIOGRAPHY HEAD AND NECK TECHNIQUE: Multidetector CT imaging of the head and neck was performed using the standard protocol during bolus administration of intravenous contrast. Multiplanar CT image reconstructions and MIPs were obtained to evaluate the vascular anatomy. Carotid stenosis measurements (when applicable) are obtained utilizing NASCET criteria, using the distal internal carotid diameter as the denominator. CONTRAST:  126mL ISOVUE-370 IOPAMIDOL (ISOVUE-370) INJECTION 76% COMPARISON:  None. FINDINGS: CT HEAD FINDINGS Brain: Noncontrast imaging the brain demonstrates scattered periventricular white matter hypoattenuation bilaterally. In age indeterminate lacunar infarct is present within the anterior limb of the left internal capsule. A ridging is white matter hypoattenuation is present in the inferior cerebellum bilaterally. No acute hemorrhage or mass lesion is present. The ventricles are of normal size. No significant extra-axial fluid collection is present. Vascular: No hyperdense vessel or unexpected calcification. Skull: Calvarium is intact. No focal lytic or blastic lesions are present. Sinuses: The paranasal sinuses and mastoid air cells are clear. Orbits: Bilateral globes and orbits are within normal limits. Review of the MIP images confirms the above findings CTA NECK FINDINGS Aortic arch: A 3 vessel arch configuration is present. Atherosclerotic calcifications are present without significant stenosis at the origins the great vessels. There is no aneurysm. Right carotid system: The  right common carotid artery is within normal limits. Mild tortuosity is present in the proximal cervical right ICA. There is focal calcification along the posterior wall of the distal right ICA. No stenosis is present. Left carotid system: The left common carotid artery is within normal limits proximally. There is focal stenosis of the mid left common carotid artery. The lumen is narrowed to 1.6 mm. This compares with a more distal segment of 6.5 mm. Soft tissue plaque is noted along the posterior aspect of the left carotid bulb without significant luminal stenosis. Focal calcification is present along the posterior wall of the distal left internal carotid artery just below the skull base without significant stenosis. Vertebral arteries: The vertebral arteries are codominant. Both vertebral arteries originate from the subclavian arteries without significant stenoses. There is no significant stenosis of either vertebral artery in the neck. Skeleton: Focal endplate changes are present with uncovertebral spurring at C6-7. Bilateral osseous foraminal narrowing is worse on the right. Vertebral body heights and alignment are otherwise normal. The patient is status post median sternotomy. An unerupted right maxillary and I tooth is present. An adjacent hypoplastic on are up to tooth is present. The patient is otherwise edentulous along the maxilla. Other neck: Soft tissue lipoma is present along the left occipital scalp. The soft tissues of the neck are unremarkable otherwise. Thyroid is within normal limits. Salivary glands are unremarkable. No significant adenopathy is present. Upper chest: Centrilobular emphysematous changes are present. No focal nodule or mass lesion is present. No significant pleural effusion is present. Review of the MIP images confirms the above findings CTA HEAD FINDINGS Anterior circulation: The internal carotid arteries are within normal limits bilaterally from the skull base through the ICA  termini. The right A1 segment is dominant. The anterior communicating artery is patent. MCA bifurcation is normal bilaterally. The ACA and MCA branch vessels are within normal limits. Posterior circulation: The vertebral arteries are codominant. Vertebrobasilar junction is normal. PICA origins are visualized and within normal  limits. Both posterior cerebral arteries originate from the basilar tip. There is mild irregularity of distal branch vessels without a significant proximal stenosis or occlusion. Venous sinuses: The dural sinuses are patent. Right transverse sinus is dominant. The straight sinus and deep cerebral veins are intact. Cortical veins are unremarkable. Anatomic variants: None Delayed phase: Post-contrast images demonstrate no pathologic enhancement. Review of the MIP images confirms the above findings IMPRESSION: 1. High-grade stenosis of the mid left common carotid artery measuring greater than 75% relative to the more distal vessels. 2. Additional soft tissue plaque in the posterior wall of the proximal left internal carotid artery without focal stenosis. 3. Focal calcification in the posterior wall of the distal internal carotid artery bilaterally just below the skull base. This could be related to remote trauma. There is no lumen compromise associated. 4. No focal disease at the ophthalmic segment on the left. 5. Scattered white matter hypoattenuation bilaterally likely reflects the sequela of chronic microvascular ischemia. 6. No acute cortical infarct. These results were called by telephone at the time of interpretation on 11/04/2017 at 3:58 pm to [facility], [place] , who verbally acknowledged these results. Electronically Signed   By: San Morelle M.D.   On: 11/04/2017 16:02   Ct Angio Neck W And/or Wo Contrast  Result Date: 11/04/2017 CLINICAL DATA:  New onset of vision loss in the left eye. The patient has no perception of light in the left eye. He began to have diminished  vision in the left eye last evening. He awoke with no vision in the left eye today. EXAM: CT ANGIOGRAPHY HEAD AND NECK TECHNIQUE: Multidetector CT imaging of the head and neck was performed using the standard protocol during bolus administration of intravenous contrast. Multiplanar CT image reconstructions and MIPs were obtained to evaluate the vascular anatomy. Carotid stenosis measurements (when applicable) are obtained utilizing NASCET criteria, using the distal internal carotid diameter as the denominator. CONTRAST:  183mL ISOVUE-370 IOPAMIDOL (ISOVUE-370) INJECTION 76% COMPARISON:  None. FINDINGS: CT HEAD FINDINGS Brain: Noncontrast imaging the brain demonstrates scattered periventricular white matter hypoattenuation bilaterally. In age indeterminate lacunar infarct is present within the anterior limb of the left internal capsule. A ridging is white matter hypoattenuation is present in the inferior cerebellum bilaterally. No acute hemorrhage or mass lesion is present. The ventricles are of normal size. No significant extra-axial fluid collection is present. Vascular: No hyperdense vessel or unexpected calcification. Skull: Calvarium is intact. No focal lytic or blastic lesions are present. Sinuses: The paranasal sinuses and mastoid air cells are clear. Orbits: Bilateral globes and orbits are within normal limits. Review of the MIP images confirms the above findings CTA NECK FINDINGS Aortic arch: A 3 vessel arch configuration is present. Atherosclerotic calcifications are present without significant stenosis at the origins the great vessels. There is no aneurysm. Right carotid system: The right common carotid artery is within normal limits. Mild tortuosity is present in the proximal cervical right ICA. There is focal calcification along the posterior wall of the distal right ICA. No stenosis is present. Left carotid system: The left common carotid artery is within normal limits proximally. There is focal  stenosis of the mid left common carotid artery. The lumen is narrowed to 1.6 mm. This compares with a more distal segment of 6.5 mm. Soft tissue plaque is noted along the posterior aspect of the left carotid bulb without significant luminal stenosis. Focal calcification is present along the posterior wall of the distal left internal carotid artery just below the  skull base without significant stenosis. Vertebral arteries: The vertebral arteries are codominant. Both vertebral arteries originate from the subclavian arteries without significant stenoses. There is no significant stenosis of either vertebral artery in the neck. Skeleton: Focal endplate changes are present with uncovertebral spurring at C6-7. Bilateral osseous foraminal narrowing is worse on the right. Vertebral body heights and alignment are otherwise normal. The patient is status post median sternotomy. An unerupted right maxillary and I tooth is present. An adjacent hypoplastic on are up to tooth is present. The patient is otherwise edentulous along the maxilla. Other neck: Soft tissue lipoma is present along the left occipital scalp. The soft tissues of the neck are unremarkable otherwise. Thyroid is within normal limits. Salivary glands are unremarkable. No significant adenopathy is present. Upper chest: Centrilobular emphysematous changes are present. No focal nodule or mass lesion is present. No significant pleural effusion is present. Review of the MIP images confirms the above findings CTA HEAD FINDINGS Anterior circulation: The internal carotid arteries are within normal limits bilaterally from the skull base through the ICA termini. The right A1 segment is dominant. The anterior communicating artery is patent. MCA bifurcation is normal bilaterally. The ACA and MCA branch vessels are within normal limits. Posterior circulation: The vertebral arteries are codominant. Vertebrobasilar junction is normal. PICA origins are visualized and within  normal limits. Both posterior cerebral arteries originate from the basilar tip. There is mild irregularity of distal branch vessels without a significant proximal stenosis or occlusion. Venous sinuses: The dural sinuses are patent. Right transverse sinus is dominant. The straight sinus and deep cerebral veins are intact. Cortical veins are unremarkable. Anatomic variants: None Delayed phase: Post-contrast images demonstrate no pathologic enhancement. Review of the MIP images confirms the above findings IMPRESSION: 1. High-grade stenosis of the mid left common carotid artery measuring greater than 75% relative to the more distal vessels. 2. Additional soft tissue plaque in the posterior wall of the proximal left internal carotid artery without focal stenosis. 3. Focal calcification in the posterior wall of the distal internal carotid artery bilaterally just below the skull base. This could be related to remote trauma. There is no lumen compromise associated. 4. No focal disease at the ophthalmic segment on the left. 5. Scattered white matter hypoattenuation bilaterally likely reflects the sequela of chronic microvascular ischemia. 6. No acute cortical infarct. These results were called by telephone at the time of interpretation on 11/04/2017 at 3:58 pm to [facility], [place] , who verbally acknowledged these results. Electronically Signed   By: San Morelle M.D.   On: 11/04/2017 16:02    Impression/Plan: Severe anemia with recent melena and coffee grounds emesis with left vision loss being worked up by neurology. Recent cocaine use could have caused his GI bleed from gut ischemia. Peptic ulcer disease also possible and needs an EGD to further evaluate. Continue Protonix drip. NPO for EGD. Diet recs following EGD will defer to neurology. Supportive care.    LOS: 1 day   [place] C.  11/05/2017, 9:44 AM  Questions please call [phone]

## 2017-11-05 NOTE — Consult Note (Signed)
Referring Provider: Dr. Alfredia Ferguson Primary Care Physician:  Dorena Dew, FNP Primary Gastroenterologist:  Althia Forts  Reason for Consultation:  GI bleed; Anemia  HPI: [name] is a 62 y.o. male presented to [facility] ER with left vision loss, shortness of breath and abdominal pain. Has been having intermittent black stools for the last few weeks stating that he has had a few black stools during that time but last episode was over a week ago. Occasional black vomitus over the last 2 weeks. Has intermittent sharp abdominal pain for the past few weeks as well. Denies dizziness or lightheadedness. Lost vision in his left eye for the past 2 days. Remote history of duodenal ulcer with surgery (reports patching done) in the early 1970's. Hgb 4.3 yesterday and after 2 U PRBCs Hgb 7.2 today. Occasional cocaine use and last used on 11/03/17. Drinks 1-2 mixed drinks and 1-2 beers per week. Also smokes marijuana. On daily Aspirin 325 mg/day and denies other NSAIDs.  Past Medical History:  Diagnosis Date  . Anginal pain ([place])   . CAD (coronary artery disease)    4V CABG 10/31/13  . Cocaine abuse ([place])   . COPD (chronic obstructive pulmonary disease) ([place])   . GERD (gastroesophageal reflux disease)   . H/O: eczema    lower extremities  . H/O: GI bleed    a. 62 y/o ago, r/t ulcer. Denies recent GIB.  Marland Kitchen High cholesterol   . Hypertension   . Myocardial infarction ([place])   . Perforation of duodenal ulcer ([place])    a. 1970s - s/p surgery.   . Shortness of breath dyspnea   . Tobacco abuse     Past Surgical History:  Procedure Laterality Date  . CORONARY ARTERY BYPASS GRAFT N/A 10/31/2013   Procedure: CORONARY ARTERY BYPASS GRAFTING TIMES FOUR ON PUMP USING LEFT INTERNAL MAMMARY ARTERY AND RIGHT GREATER SAPHENOUS VEIN VIA ENDOVEIN HARVEST.;  Surgeon: Melrose Nakayama, MD;  Location: [place];  Service: Open Heart Surgery;  Laterality: N/A;  . INTRAOPERATIVE TRANSESOPHAGEAL ECHOCARDIOGRAM N/A 10/31/2013    Procedure: INTRAOPERATIVE TRANSESOPHAGEAL ECHOCARDIOGRAM;  Surgeon: Melrose Nakayama, MD;  Location: [place];  Service: Open Heart Surgery;  Laterality: N/A;  . LEFT HEART CATHETERIZATION WITH CORONARY ANGIOGRAM N/A 10/28/2013   Procedure: LEFT HEART CATHETERIZATION WITH CORONARY ANGIOGRAM;  Surgeon: Burnell Blanks, MD;  Location[facility] CATH LAB;  Service: Cardiovascular;  Laterality: N/A;  . stomach sx      Prior to Admission medications   Medication Sig Start Date End Date Taking? Authorizing Provider  aspirin 325 MG EC tablet Take 1 tablet (325 mg total) by mouth daily. 11/19/15  Yes Dorena Dew, FNP  atorvastatin (LIPITOR) 80 MG tablet TAKE 1 TABLET BY MOUTH DAILY AT 6 PM. 08/30/17  Yes Dorena Dew, FNP  fluticasone [facility]) 50 MCG/ACT nasal spray Place 2 sprays into both nostrils as needed for allergies or rhinitis.   Yes [provider]  fluticasone furoate-vilanterol (BREO ELLIPTA) 200-25 MCG/INH AEPB Inhale 1 puff into the lungs daily. 01/27/17  Yes Jegede, Olugbemiga E, MD  hydrochlorothiazide (MICROZIDE) 12.5 MG capsule TAKE 1 CAPSULE BY MOUTH DAILY. 08/30/17  Yes Dorena Dew, FNP  triamcinolone (KENALOG) 0.025 % ointment APPLY 1 APPLICATION TOPICALLY 2 TIMES DAILY. Patient taking differently: APPLY 1 APPLICATION TOPICALLY 2 TIMES qd prn rash 10/20/16  Yes Dorena Dew, FNP  VENTOLIN HFA 108 (90 Base) MCG/ACT inhaler INHALE 2 PUFFS INTO THE LUNGS EVERY 6 HOURS AS NEEDED FOR WHEEZING OR  SHORTNESS OF BREATH. 10/10/17  Yes Dorena Dew, FNP  albuterol (PROVENTIL HFA;VENTOLIN HFA) 108 (90 Base) MCG/ACT inhaler Inhale 2 puffs into the lungs every 6 (six) hours as needed for wheezing or shortness of breath. Patient not taking: Reported on 06/28/2017 01/27/17   Tresa Garter, MD  budesonide-formoterol [facility]) 160-4.5 MCG/ACT inhaler Inhale 2 puffs into the lungs 2 (two) times daily. Patient not taking: Reported on 06/28/2017 11/28/16   Dorena Dew, FNP  hydrochlorothiazide (MICROZIDE) 12.5 MG capsule TAKE 1 CAPSULE BY MOUTH DAILY. Patient not taking: Reported on 11/04/2017 01/13/17   Dorena Dew, FNP  hydrochlorothiazide (MICROZIDE) 12.5 MG capsule TAKE 1 CAPSULE BY MOUTH DAILY. Patient not taking: Reported on 11/04/2017 08/30/17   Dorena Dew, FNP  nicotine (NICODERM CQ) 14 mg/24hr patch Place 1 patch (14 mg total) onto the skin daily. Patient not taking: Reported on 06/28/2017 11/28/16   Dorena Dew, FNP  SYMBICORT 160-4.5 MCG/ACT inhaler INHALE 2 PUFFS INTO THE LUNGS 2 TIMES DAILY. Patient not taking: Reported on 11/04/2017 03/16/17   Dorena Dew, FNP    Scheduled Meds: . atorvastatin  80 mg Oral q1800  . fluticasone furoate-vilanterol  1 puff Inhalation Daily  . folic acid  1 mg Oral Daily  . multivitamin with minerals  1 tablet Oral Daily  . nicotine  14 mg Transdermal Daily  . [START ON 11/08/2017] pantoprazole  40 mg Intravenous Q12H  . thiamine  100 mg Oral Daily   Continuous Infusions: . sodium chloride    . 0.9 % NaCl with KCl 40 mEq / L 75 mL/hr (11/05/17 0436)  . pantoprazole (PROTONIX) IVPB    . pantoprozole (PROTONIX) infusion     PRN Meds:.acetaminophen **OR** acetaminophen, albuterol, fluticasone, LORazepam, ondansetron **OR** ondansetron (ZOFRAN) IV, polyethylene glycol, traMADol  Allergies as of 11/04/2017  . (No Known Allergies)    Family History  Problem Relation Age of Onset  . Lung cancer Unknown     Social History   Socioeconomic History  . Marital status: Single    Spouse name: Not on file  . Number of children: Not on file  . Years of education: Not on file  . Highest education level: Not on file  Occupational History  . Not on file  Social Needs  . Financial resource strain: Not on file  . Food insecurity:    Worry: Not on file    Inability: Not on file  . Transportation needs:    Medical: Not on file    Non-medical: Not on file  Tobacco Use  . Smoking status:  Current Every Day Smoker    Packs/day: 0.50    Years: 40.00    Pack years: 20.00    Types: Cigarettes  . Smokeless tobacco: Never Used  Substance and Sexual Activity  . Alcohol use: Yes    Alcohol/week: 1.2 oz    Types: 2 Cans of beer per week    Comment: 6 pack over the weekend   . Drug use: Yes    Types: Marijuana, Cocaine    Comment: "about 1 marijuana cigarette a week"  . Sexual activity: Yes    Birth control/protection: None  Lifestyle  . Physical activity:    Days per week: Not on file    Minutes per session: Not on file  . Stress: Not on file  Relationships  . Social connections:    Talks on phone: Not on file    Gets together: Not on file  Attends religious service: Not on file    Active member of club or organization: Not on file    Attends meetings of clubs or organizations: Not on file    Relationship status: Not on file  . Intimate partner violence:    Fear of current or ex partner: Not on file    Emotionally abused: Not on file    Physically abused: Not on file    Forced sexual activity: Not on file  Other Topics Concern  . Not on file  Social History Narrative  . Not on file    Review of Systems: All negative except as stated above in HPI.  Physical Exam: Vital signs: Vitals:   11/05/17 0800 11/05/17 0907  BP: (!) 94/55   Pulse:  70  Resp: 20 14  Temp: 98.3 F (36.8 C)   SpO2: 100% 100%    Last BM Date: 11/03/17 General:   Alert,  Well-developed, well-nourished, pleasant and cooperative in NAD Head: normocephalic, atraumatic Eyes: anicteric sclera ENT: oropharynx clear Neck: supple, nontender Lungs:  Clear throughout to auscultation.   No wheezes, crackles, or rhonchi. No acute distress. Heart:  Regular rate and rhythm; no murmurs, clicks, rubs,  or gallops. Abdomen: soft, nontender, nondistended, +BS  Rectal:  Deferred Ext: no edema  GI:  Lab Results: Recent Labs    11/04/17 1353 11/04/17 1502 11/05/17 0253  WBC 6.6  --  7.1   HGB 4.7* 4.3* 7.2*  HCT 15.4* 14.4* 23.0*  PLT 339  --  305   BMET Recent Labs    11/04/17 1353 11/05/17 0253  NA 136 135  K 2.9* 3.5  CL 101 104  CO2 24 22  GLUCOSE 97 86  BUN 11 7  CREATININE 1.05 0.95  CALCIUM 8.5* 8.4*   LFT Recent Labs    11/05/17 0253  PROT 6.0*  ALBUMIN 3.2*  AST 16  ALT 11*  ALKPHOS 113  BILITOT 2.0*   PT/INR Recent Labs    11/04/17 1353 11/05/17 0253  LABPROT 12.4 13.2  INR 0.93 1.01     Studies/Results: Ct Angio Head W Or Wo Contrast  Result Date: 11/04/2017 CLINICAL DATA:  New onset of vision loss in the left eye. The patient has no perception of light in the left eye. He began to have diminished vision in the left eye last evening. He awoke with no vision in the left eye today. EXAM: CT ANGIOGRAPHY HEAD AND NECK TECHNIQUE: Multidetector CT imaging of the head and neck was performed using the standard protocol during bolus administration of intravenous contrast. Multiplanar CT image reconstructions and MIPs were obtained to evaluate the vascular anatomy. Carotid stenosis measurements (when applicable) are obtained utilizing NASCET criteria, using the distal internal carotid diameter as the denominator. CONTRAST:  172mL ISOVUE-370 IOPAMIDOL (ISOVUE-370) INJECTION 76% COMPARISON:  None. FINDINGS: CT HEAD FINDINGS Brain: Noncontrast imaging the brain demonstrates scattered periventricular white matter hypoattenuation bilaterally. In age indeterminate lacunar infarct is present within the anterior limb of the left internal capsule. A ridging is white matter hypoattenuation is present in the inferior cerebellum bilaterally. No acute hemorrhage or mass lesion is present. The ventricles are of normal size. No significant extra-axial fluid collection is present. Vascular: No hyperdense vessel or unexpected calcification. Skull: Calvarium is intact. No focal lytic or blastic lesions are present. Sinuses: The paranasal sinuses and mastoid air cells are  clear. Orbits: Bilateral globes and orbits are within normal limits. Review of the MIP images confirms the above findings CTA NECK  FINDINGS Aortic arch: A 3 vessel arch configuration is present. Atherosclerotic calcifications are present without significant stenosis at the origins the great vessels. There is no aneurysm. Right carotid system: The right common carotid artery is within normal limits. Mild tortuosity is present in the proximal cervical right ICA. There is focal calcification along the posterior wall of the distal right ICA. No stenosis is present. Left carotid system: The left common carotid artery is within normal limits proximally. There is focal stenosis of the mid left common carotid artery. The lumen is narrowed to 1.6 mm. This compares with a more distal segment of 6.5 mm. Soft tissue plaque is noted along the posterior aspect of the left carotid bulb without significant luminal stenosis. Focal calcification is present along the posterior wall of the distal left internal carotid artery just below the skull base without significant stenosis. Vertebral arteries: The vertebral arteries are codominant. Both vertebral arteries originate from the subclavian arteries without significant stenoses. There is no significant stenosis of either vertebral artery in the neck. Skeleton: Focal endplate changes are present with uncovertebral spurring at C6-7. Bilateral osseous foraminal narrowing is worse on the right. Vertebral body heights and alignment are otherwise normal. The patient is status post median sternotomy. An unerupted right maxillary and I tooth is present. An adjacent hypoplastic on are up to tooth is present. The patient is otherwise edentulous along the maxilla. Other neck: Soft tissue lipoma is present along the left occipital scalp. The soft tissues of the neck are unremarkable otherwise. Thyroid is within normal limits. Salivary glands are unremarkable. No significant adenopathy is present.  Upper chest: Centrilobular emphysematous changes are present. No focal nodule or mass lesion is present. No significant pleural effusion is present. Review of the MIP images confirms the above findings CTA HEAD FINDINGS Anterior circulation: The internal carotid arteries are within normal limits bilaterally from the skull base through the ICA termini. The right A1 segment is dominant. The anterior communicating artery is patent. MCA bifurcation is normal bilaterally. The ACA and MCA branch vessels are within normal limits. Posterior circulation: The vertebral arteries are codominant. Vertebrobasilar junction is normal. PICA origins are visualized and within normal limits. Both posterior cerebral arteries originate from the basilar tip. There is mild irregularity of distal branch vessels without a significant proximal stenosis or occlusion. Venous sinuses: The dural sinuses are patent. Right transverse sinus is dominant. The straight sinus and deep cerebral veins are intact. Cortical veins are unremarkable. Anatomic variants: None Delayed phase: Post-contrast images demonstrate no pathologic enhancement. Review of the MIP images confirms the above findings IMPRESSION: 1. High-grade stenosis of the mid left common carotid artery measuring greater than 75% relative to the more distal vessels. 2. Additional soft tissue plaque in the posterior wall of the proximal left internal carotid artery without focal stenosis. 3. Focal calcification in the posterior wall of the distal internal carotid artery bilaterally just below the skull base. This could be related to remote trauma. There is no lumen compromise associated. 4. No focal disease at the ophthalmic segment on the left. 5. Scattered white matter hypoattenuation bilaterally likely reflects the sequela of chronic microvascular ischemia. 6. No acute cortical infarct. These results were called by telephone at the time of interpretation on 11/04/2017 at 3:58 pm to [facility], [place] , who verbally acknowledged these results. Electronically Signed   By: San Morelle M.D.   On: 11/04/2017 16:02   Ct Head Wo Contrast  Result Date: 11/04/2017 CLINICAL DATA:  New  onset of vision loss in the left eye. The patient has no perception of light in the left eye. He began to have diminished vision in the left eye last evening. He awoke with no vision in the left eye today. EXAM: CT ANGIOGRAPHY HEAD AND NECK TECHNIQUE: Multidetector CT imaging of the head and neck was performed using the standard protocol during bolus administration of intravenous contrast. Multiplanar CT image reconstructions and MIPs were obtained to evaluate the vascular anatomy. Carotid stenosis measurements (when applicable) are obtained utilizing NASCET criteria, using the distal internal carotid diameter as the denominator. CONTRAST:  134mL ISOVUE-370 IOPAMIDOL (ISOVUE-370) INJECTION 76% COMPARISON:  None. FINDINGS: CT HEAD FINDINGS Brain: Noncontrast imaging the brain demonstrates scattered periventricular white matter hypoattenuation bilaterally. In age indeterminate lacunar infarct is present within the anterior limb of the left internal capsule. A ridging is white matter hypoattenuation is present in the inferior cerebellum bilaterally. No acute hemorrhage or mass lesion is present. The ventricles are of normal size. No significant extra-axial fluid collection is present. Vascular: No hyperdense vessel or unexpected calcification. Skull: Calvarium is intact. No focal lytic or blastic lesions are present. Sinuses: The paranasal sinuses and mastoid air cells are clear. Orbits: Bilateral globes and orbits are within normal limits. Review of the MIP images confirms the above findings CTA NECK FINDINGS Aortic arch: A 3 vessel arch configuration is present. Atherosclerotic calcifications are present without significant stenosis at the origins the great vessels. There is no aneurysm. Right carotid system: The  right common carotid artery is within normal limits. Mild tortuosity is present in the proximal cervical right ICA. There is focal calcification along the posterior wall of the distal right ICA. No stenosis is present. Left carotid system: The left common carotid artery is within normal limits proximally. There is focal stenosis of the mid left common carotid artery. The lumen is narrowed to 1.6 mm. This compares with a more distal segment of 6.5 mm. Soft tissue plaque is noted along the posterior aspect of the left carotid bulb without significant luminal stenosis. Focal calcification is present along the posterior wall of the distal left internal carotid artery just below the skull base without significant stenosis. Vertebral arteries: The vertebral arteries are codominant. Both vertebral arteries originate from the subclavian arteries without significant stenoses. There is no significant stenosis of either vertebral artery in the neck. Skeleton: Focal endplate changes are present with uncovertebral spurring at C6-7. Bilateral osseous foraminal narrowing is worse on the right. Vertebral body heights and alignment are otherwise normal. The patient is status post median sternotomy. An unerupted right maxillary and I tooth is present. An adjacent hypoplastic on are up to tooth is present. The patient is otherwise edentulous along the maxilla. Other neck: Soft tissue lipoma is present along the left occipital scalp. The soft tissues of the neck are unremarkable otherwise. Thyroid is within normal limits. Salivary glands are unremarkable. No significant adenopathy is present. Upper chest: Centrilobular emphysematous changes are present. No focal nodule or mass lesion is present. No significant pleural effusion is present. Review of the MIP images confirms the above findings CTA HEAD FINDINGS Anterior circulation: The internal carotid arteries are within normal limits bilaterally from the skull base through the ICA  termini. The right A1 segment is dominant. The anterior communicating artery is patent. MCA bifurcation is normal bilaterally. The ACA and MCA branch vessels are within normal limits. Posterior circulation: The vertebral arteries are codominant. Vertebrobasilar junction is normal. PICA origins are visualized and within normal  limits. Both posterior cerebral arteries originate from the basilar tip. There is mild irregularity of distal branch vessels without a significant proximal stenosis or occlusion. Venous sinuses: The dural sinuses are patent. Right transverse sinus is dominant. The straight sinus and deep cerebral veins are intact. Cortical veins are unremarkable. Anatomic variants: None Delayed phase: Post-contrast images demonstrate no pathologic enhancement. Review of the MIP images confirms the above findings IMPRESSION: 1. High-grade stenosis of the mid left common carotid artery measuring greater than 75% relative to the more distal vessels. 2. Additional soft tissue plaque in the posterior wall of the proximal left internal carotid artery without focal stenosis. 3. Focal calcification in the posterior wall of the distal internal carotid artery bilaterally just below the skull base. This could be related to remote trauma. There is no lumen compromise associated. 4. No focal disease at the ophthalmic segment on the left. 5. Scattered white matter hypoattenuation bilaterally likely reflects the sequela of chronic microvascular ischemia. 6. No acute cortical infarct. These results were called by telephone at the time of interpretation on 11/04/2017 at 3:58 pm to [facility], [place] , who verbally acknowledged these results. Electronically Signed   By: San Morelle M.D.   On: 11/04/2017 16:02   Ct Angio Neck W And/or Wo Contrast  Result Date: 11/04/2017 CLINICAL DATA:  New onset of vision loss in the left eye. The patient has no perception of light in the left eye. He began to have diminished  vision in the left eye last evening. He awoke with no vision in the left eye today. EXAM: CT ANGIOGRAPHY HEAD AND NECK TECHNIQUE: Multidetector CT imaging of the head and neck was performed using the standard protocol during bolus administration of intravenous contrast. Multiplanar CT image reconstructions and MIPs were obtained to evaluate the vascular anatomy. Carotid stenosis measurements (when applicable) are obtained utilizing NASCET criteria, using the distal internal carotid diameter as the denominator. CONTRAST:  178mL ISOVUE-370 IOPAMIDOL (ISOVUE-370) INJECTION 76% COMPARISON:  None. FINDINGS: CT HEAD FINDINGS Brain: Noncontrast imaging the brain demonstrates scattered periventricular white matter hypoattenuation bilaterally. In age indeterminate lacunar infarct is present within the anterior limb of the left internal capsule. A ridging is white matter hypoattenuation is present in the inferior cerebellum bilaterally. No acute hemorrhage or mass lesion is present. The ventricles are of normal size. No significant extra-axial fluid collection is present. Vascular: No hyperdense vessel or unexpected calcification. Skull: Calvarium is intact. No focal lytic or blastic lesions are present. Sinuses: The paranasal sinuses and mastoid air cells are clear. Orbits: Bilateral globes and orbits are within normal limits. Review of the MIP images confirms the above findings CTA NECK FINDINGS Aortic arch: A 3 vessel arch configuration is present. Atherosclerotic calcifications are present without significant stenosis at the origins the great vessels. There is no aneurysm. Right carotid system: The right common carotid artery is within normal limits. Mild tortuosity is present in the proximal cervical right ICA. There is focal calcification along the posterior wall of the distal right ICA. No stenosis is present. Left carotid system: The left common carotid artery is within normal limits proximally. There is focal  stenosis of the mid left common carotid artery. The lumen is narrowed to 1.6 mm. This compares with a more distal segment of 6.5 mm. Soft tissue plaque is noted along the posterior aspect of the left carotid bulb without significant luminal stenosis. Focal calcification is present along the posterior wall of the distal left internal carotid artery just below the  skull base without significant stenosis. Vertebral arteries: The vertebral arteries are codominant. Both vertebral arteries originate from the subclavian arteries without significant stenoses. There is no significant stenosis of either vertebral artery in the neck. Skeleton: Focal endplate changes are present with uncovertebral spurring at C6-7. Bilateral osseous foraminal narrowing is worse on the right. Vertebral body heights and alignment are otherwise normal. The patient is status post median sternotomy. An unerupted right maxillary and I tooth is present. An adjacent hypoplastic on are up to tooth is present. The patient is otherwise edentulous along the maxilla. Other neck: Soft tissue lipoma is present along the left occipital scalp. The soft tissues of the neck are unremarkable otherwise. Thyroid is within normal limits. Salivary glands are unremarkable. No significant adenopathy is present. Upper chest: Centrilobular emphysematous changes are present. No focal nodule or mass lesion is present. No significant pleural effusion is present. Review of the MIP images confirms the above findings CTA HEAD FINDINGS Anterior circulation: The internal carotid arteries are within normal limits bilaterally from the skull base through the ICA termini. The right A1 segment is dominant. The anterior communicating artery is patent. MCA bifurcation is normal bilaterally. The ACA and MCA branch vessels are within normal limits. Posterior circulation: The vertebral arteries are codominant. Vertebrobasilar junction is normal. PICA origins are visualized and within  normal limits. Both posterior cerebral arteries originate from the basilar tip. There is mild irregularity of distal branch vessels without a significant proximal stenosis or occlusion. Venous sinuses: The dural sinuses are patent. Right transverse sinus is dominant. The straight sinus and deep cerebral veins are intact. Cortical veins are unremarkable. Anatomic variants: None Delayed phase: Post-contrast images demonstrate no pathologic enhancement. Review of the MIP images confirms the above findings IMPRESSION: 1. High-grade stenosis of the mid left common carotid artery measuring greater than 75% relative to the more distal vessels. 2. Additional soft tissue plaque in the posterior wall of the proximal left internal carotid artery without focal stenosis. 3. Focal calcification in the posterior wall of the distal internal carotid artery bilaterally just below the skull base. This could be related to remote trauma. There is no lumen compromise associated. 4. No focal disease at the ophthalmic segment on the left. 5. Scattered white matter hypoattenuation bilaterally likely reflects the sequela of chronic microvascular ischemia. 6. No acute cortical infarct. These results were called by telephone at the time of interpretation on 11/04/2017 at 3:58 pm to [facility], [place] , who verbally acknowledged these results. Electronically Signed   By: San Morelle M.D.   On: 11/04/2017 16:02    Impression/Plan: Severe anemia with recent melena and coffee grounds emesis with left vision loss being worked up by neurology. Recent cocaine use could have caused his GI bleed from gut ischemia. Peptic ulcer disease also possible and needs an EGD to further evaluate. Continue Protonix drip. NPO for EGD. Diet recs following EGD will defer to neurology. Supportive care.    LOS: 1 day   [place] C.  11/05/2017, 9:44 AM  Questions please call [phone]

## 2017-11-05 NOTE — Progress Notes (Addendum)
   Daily Progress Note   Pt down in EGD currently.  Will see once recovered and back in room.  Pt will need Ophthalmology to evaluate eye for retinal artery occlusion.   + Retinal artery occlusion suggests embolization which would lead to L CEA + CCA stenting (which has increased mortality and morbidity in recently published paper)  - Retinal artery occlusion suggests underflow due to CCA stenosis which leads to L carotid open exposure and retrograde CCA stenting.   Adele Barthel, MD, FACS [facility] Office: [phone] Pager: [phone]  11/05/2017, 12:27 PM   Addendum  Ophthalmology was in the room when I came by to see patient.  Will be going to OR with emergency, so may have to see the patient tomorrow.  Adele Barthel, MD, FACS [facility] [place] Office: [phone] Pager: [phone]  11/05/2017, 5:27 PM

## 2017-11-05 NOTE — Anesthesia Preprocedure Evaluation (Signed)
Anesthesia Evaluation  Patient identified by MRN, date of birth, ID band Patient awake    Reviewed: Allergy & Precautions, NPO status , Patient's Chart, lab work & pertinent test results  Airway Mallampati: I  TM Distance: >3 FB Neck ROM: Full    Dental   Pulmonary COPD, Current Smoker,    Pulmonary exam normal        Cardiovascular hypertension, Pt. on medications + CAD and + Past MI  Normal cardiovascular exam     Neuro/Psych    GI/Hepatic GERD  Medicated and Controlled,  Endo/Other    Renal/GU      Musculoskeletal   Abdominal   Peds  Hematology   Anesthesia Other Findings   Reproductive/Obstetrics                             Anesthesia Physical Anesthesia Plan  ASA: III  Anesthesia Plan: MAC   Post-op Pain Management:    Induction: Intravenous  PONV Risk Score and Plan: Treatment may vary due to age or medical condition  Airway Management Planned: Simple Face Mask  Additional Equipment:   Intra-op Plan:   Post-operative Plan:   Informed Consent: I have reviewed the patients History and Physical, chart, labs and discussed the procedure including the risks, benefits and alternatives for the proposed anesthesia with the patient or authorized representative who has indicated his/her understanding and acceptance.     Plan Discussed with: CRNA and Surgeon  Anesthesia Plan Comments:         Anesthesia Quick Evaluation

## 2017-11-05 NOTE — Anesthesia Postprocedure Evaluation (Signed)
Anesthesia Post Note  Patient: [name]  Procedure(s) Performed: ESOPHAGOGASTRODUODENOSCOPY (EGD) WITH PROPOFOL (N/A ) BIOPSY     Patient location during evaluation: PACU Anesthesia Type: MAC Level of consciousness: awake and alert Pain management: pain level controlled Vital Signs Assessment: post-procedure vital signs reviewed and stable Respiratory status: spontaneous breathing, nonlabored ventilation, respiratory function stable and patient connected to nasal cannula oxygen Cardiovascular status: stable and blood pressure returned to baseline Postop Assessment: no apparent nausea or vomiting Anesthetic complications: no    Last Vitals:  Vitals:   11/05/17 1210 11/05/17 1230  BP: (!) 108/53 104/61  Pulse: 65 65  Resp: 19 16  Temp:  37.1 C  SpO2: 100% 100%    Last Pain:  Vitals:   11/05/17 1230  TempSrc: Oral  PainSc: 0-No pain                 Kym Fenter DAVID

## 2017-11-05 NOTE — ED Notes (Signed)
ED TO INPATIENT HANDOFF REPORT  Name/Age/Gender [name] 62 y.o. male  Code Status Code Status History    Date Active Date Inactive Code Status Order ID Comments User Context   07/02/2015 0820 07/06/2015 1632 Full Code [id number]  Rush Farmer, MD ED   09/01/2014 1048 09/04/2014 1956 Full Code [id number]  Samella Parr, NP ED   10/31/2013 1348 11/05/2013 1858 Full Code [id number]  John Giovanni, PA-C Inpatient   10/28/2013 1409 10/31/2013 1347 Full Code [id number]  Burnell Blanks, MD Inpatient   10/26/2013 0832 10/28/2013 1409 Full Code [id number]  Cletus Gash, MD Inpatient   05/19/2011 0725 05/23/2011 1721 Full Code [id number]  Dupell, Gerald Dexter, RN ED      Home/SNF/Other Home  Chief Complaint loss of vision left eye  Level of Care/Admitting Diagnosis ED Disposition    ED Disposition Condition [place]: [place] [[id number]]  Level of Care: Stepdown [14]  Diagnosis: Vision loss of left eye [332951]  Admitting Physician: [place], [place] [[id number]]  Attending Physician: Raiford Noble LATIF [[id number]]  Estimated length of stay: past midnight tomorrow  Certification:: I certify this patient will need inpatient services for at least 2 midnights  PT Class (Do Not Modify): Inpatient [101]  PT Acc Code (Do Not Modify): Private [1]       Medical History Past Medical History:  Diagnosis Date  . Anginal pain ([place])   . CAD (coronary artery disease)    4V CABG 10/31/13  . Cocaine abuse ([place])   . COPD (chronic obstructive pulmonary disease) ([place])   . GERD (gastroesophageal reflux disease)   . H/O: eczema    lower extremities  . H/O: GI bleed    a. 62 y/o ago, r/t ulcer. Denies recent GIB.  Mar[place] Kitchen High cholesterol   . Hypertension   . Myocardial infarction ([place])   . Perforation of duodenal ulcer ([place])    a. 1970s - s/p surgery.   . Shortness of breath dyspnea   . Tobacco abuse     Allergies No Known Allergies  IV  Location/Drains/Wounds Patient Lines/Drains/Airways Status   Active Line/Drains/Airways    Name:   Placement date:   Placement time:   Site:   Days:   Peripheral IV 11/04/17 Left Antecubital   11/04/17    1413    Antecubital   1   Peripheral IV 11/04/17 Right Antecubital   11/04/17    1755    Antecubital   1   Incision (Closed) 10/31/13 Leg Right   10/31/13    1147     1466   Incision (Closed) 10/31/13 Chest Other (Comment)   10/31/13    1147     1466          Labs/Imaging Results for orders placed or performed during the hospital encounter of 11/04/17 (from the past 48 hour(s))  CBC with Differential     Status: Abnormal   Collection Time: 11/04/17  1:53 PM  Result Value Ref Range   WBC 6.6 4.0 - 10.5 K/uL   RBC 2.07 (L) 4.22 - 5.81 MIL/uL   Hemoglobin 4.7 (LL) 13.0 - 17.0 g/dL    Comment: REPEATED TO VERIFY CRITICAL RESULT CALLED TO, READ BACK BY AND VERIFIED WITH: KOOS, CHEALSEA '@14' :43 BLACK,M 11/04/17    HCT 15.4 (L) 39.0 - 52.0 %   MCV 74.4 (L) 78.0 - 100.0 fL   MCH 22.7 (L) 26.0 - 34.0 pg   MCHC 30.5  30.0 - 36.0 g/dL   RDW 21.6 (H) 11.5 - 15.5 %   Platelets 339 150 - 400 K/uL   Neutrophils Relative % 57 %   Lymphocytes Relative 25 %   Monocytes Relative 11 %   Eosinophils Relative 6 %   Basophils Relative 1 %   Neutro Abs 3.7 1.7 - 7.7 K/uL   Lymphs Abs 1.7 0.7 - 4.0 K/uL   Monocytes Absolute 0.7 0.1 - 1.0 K/uL   Eosinophils Absolute 0.4 0.0 - 0.7 K/uL   Basophils Absolute 0.1 0.0 - 0.1 K/uL   RBC Morphology POLYCHROMASIA PRESENT     Comment: Performed at [facility], [place] [address], [place], [place] [address]  Basic metabolic panel     Status: Abnormal   Collection Time: 11/04/17  1:53 PM  Result Value Ref Range   Sodium 136 135 - 145 mmol/L   Potassium 2.9 (L) 3.5 - 5.1 mmol/L   Chloride 101 101 - 111 mmol/L   CO2 24 22 - 32 mmol/L   Glucose, Bld 97 65 - 99 mg/dL   BUN 11 6 - 20 mg/dL   Creatinine, Ser 1.05 0.61 - 1.24 mg/dL   Calcium  8.5 (L) 8.9 - 10.3 mg/dL   GFR calc non Af Amer >60 >60 mL/min   GFR calc Af Amer >60 >60 mL/min    Comment: (NOTE) The eGFR has been calculated using the CKD EPI equation. This calculation has not been validated in all clinical situations. eGFR's persistently <60 mL/min signify possible Chronic Kidney Disease.    Anion gap 11 5 - 15    Comment: Performed at [facility], [place] [address], [place], [place] [address]  Protime-INR     Status: None   Collection Time: 11/04/17  1:53 PM  Result Value Ref Range   Prothrombin Time 12.4 11.4 - 15.2 seconds   INR 0.93     Comment: Performed at [facility], [place] [address] [place], [place] [address]  Sedimentation rate     Status: Abnormal   Collection Time: 11/04/17  1:53 PM  Result Value Ref Range   Sed Rate 45 (H) 0 - 16 mm/hr    Comment: Performed at [facility], [place] [address], [place], [place] [address]  Rapid urine drug screen (hospital performed)     Status: Abnormal   Collection Time: 11/04/17  1:53 PM  Result Value Ref Range   Opiates NONE DETECTED NONE DETECTED   Cocaine POSITIVE (A) NONE DETECTED   Benzodiazepines NONE DETECTED NONE DETECTED   Amphetamines NONE DETECTED NONE DETECTED   Tetrahydrocannabinol POSITIVE (A) NONE DETECTED   Barbiturates NONE DETECTED NONE DETECTED    Comment: (NOTE) DRUG SCREEN FOR MEDICAL PURPOSES ONLY.  IF CONFIRMATION IS NEEDED FOR ANY PURPOSE, NOTIFY LAB WITHIN 5 DAYS. LOWEST DETECTABLE LIMITS FOR URINE DRUG SCREEN Drug Class                     Cutoff (ng/mL) Amphetamine and metabolites    1000 Barbiturate and metabolites    200 Benzodiazepine                 035 Tricyclics and metabolites     300 Opiates and metabolites        300 Cocaine and metabolites        300 THC  50 Performed at [facility], [place] [address], [place], [place] [address]   Hemoglobin and hematocrit, blood      Status: Abnormal   Collection Time: 11/04/17  3:02 PM  Result Value Ref Range   Hemoglobin 4.3 (LL) 13.0 - 17.0 g/dL    Comment: CRITICAL RESULT CALLED TO, READ BACK BY AND VERIFIED WITH: Alanson Aly RN [id number] [place],N    HCT 14.4 (L) 39.0 - 52.0 %    Comment: Performed at [facility], [place] [address], [place], [place] [address]  Sample to Blood Bank     Status: None   Collection Time: 11/04/17  3:02 PM  Result Value Ref Range   Blood Bank Specimen SAMPLE AVAILABLE FOR TESTING    Sample Expiration      11/07/2017 Performed at [facility], [place] [address], [place], [place] [address]   Type and screen [place]     Status: None (Preliminary result)   Collection Time: 11/04/17  3:39 PM  Result Value Ref Range   ABO/RH(D) O POS    Antibody Screen NEG    Sample Expiration 11/07/2017    Unit Number [id number]    Blood Component Type RED CELLS,LR    Unit division 00    Status of Unit ISSUED    Transfusion Status OK TO TRANSFUSE    Crossmatch Result Compatible    Unit Number [id number]    Blood Component Type RED CELLS,LR    Unit division 00    Status of Unit ISSUED    Transfusion Status OK TO TRANSFUSE    Crossmatch Result      Compatible Performed at [facility], [place] [address], [place], [place] [address]   Prepare RBC     Status: None   Collection Time: 11/04/17  3:39 PM  Result Value Ref Range   Order Confirmation      ORDER PROCESSED BY BLOOD BANK Performed at [facility], [place] [address] [place], [place] [address]   ABO/Rh     Status: None   Collection Time: 11/04/17  3:40 PM  Result Value Ref Range   ABO/RH(D)      O POS Performed at [facility], [place] [address], [place], [place] [address]   POC occult blood, ED     Status: None   Collection Time: 11/04/17  3:49 PM  Result Value Ref Range   Fecal Occult Bld NEGATIVE NEGATIVE   Reticulocytes     Status: Abnormal   Collection Time: 11/04/17  3:49 PM  Result Value Ref Range   Retic Ct Pct >23.0 (H) 0.4 - 3.1 %    Comment: RESULTS CONFIRMED BY MANUAL DILUTION   RBC. 1.93 (L) 4.22 - 5.81 MIL/uL   Retic Count, Absolute NOT CALCULATED 19.0 - 186.0 K/uL    Comment: Performed at [facility], [place] [address], [place], [place] [address]  I-Stat Troponin, ED (not at [facility])     Status: None   Collection Time: 11/04/17  4:19 PM  Result Value Ref Range   Troponin i, poc 0.03 0.00 - 0.08 ng/mL   Comment 3            Comment: Due to the release kinetics of cTnI, a negative result within the first hours of the onset of symptoms does not rule out myocardial infarction with certainty. If myocardial infarction is still suspected, repeat the test at appropriate intervals.  Ct Angio Head W Or Wo Contrast  Result Date: 11/04/2017 CLINICAL DATA:  New onset of vision loss in the left eye. The patient has no perception of light in the left eye. He began to have diminished vision in the left eye last evening. He awoke with no vision in the left eye today. EXAM: CT ANGIOGRAPHY HEAD AND NECK TECHNIQUE: Multidetector CT imaging of the head and neck was performed using the standard protocol during bolus administration of intravenous contrast. Multiplanar CT image reconstructions and MIPs were obtained to evaluate the vascular anatomy. Carotid stenosis measurements (when applicable) are obtained utilizing NASCET criteria, using the distal internal carotid diameter as the denominator. CONTRAST:  162m ISOVUE-370 IOPAMIDOL (ISOVUE-370) INJECTION 76% COMPARISON:  None. FINDINGS: CT HEAD FINDINGS Brain: Noncontrast imaging the brain demonstrates scattered periventricular white matter hypoattenuation bilaterally. In age indeterminate lacunar infarct is present within the anterior limb of the left internal capsule. A ridging is white matter hypoattenuation is present in the inferior  cerebellum bilaterally. No acute hemorrhage or mass lesion is present. The ventricles are of normal size. No significant extra-axial fluid collection is present. Vascular: No hyperdense vessel or unexpected calcification. Skull: Calvarium is intact. No focal lytic or blastic lesions are present. Sinuses: The paranasal sinuses and mastoid air cells are clear. Orbits: Bilateral globes and orbits are within normal limits. Review of the MIP images confirms the above findings CTA NECK FINDINGS Aortic arch: A 3 vessel arch configuration is present. Atherosclerotic calcifications are present without significant stenosis at the origins the great vessels. There is no aneurysm. Right carotid system: The right common carotid artery is within normal limits. Mild tortuosity is present in the proximal cervical right ICA. There is focal calcification along the posterior wall of the distal right ICA. No stenosis is present. Left carotid system: The left common carotid artery is within normal limits proximally. There is focal stenosis of the mid left common carotid artery. The lumen is narrowed to 1.6 mm. This compares with a more distal segment of 6.5 mm. Soft tissue plaque is noted along the posterior aspect of the left carotid bulb without significant luminal stenosis. Focal calcification is present along the posterior wall of the distal left internal carotid artery just below the skull base without significant stenosis. Vertebral arteries: The vertebral arteries are codominant. Both vertebral arteries originate from the subclavian arteries without significant stenoses. There is no significant stenosis of either vertebral artery in the neck. Skeleton: Focal endplate changes are present with uncovertebral spurring at C6-7. Bilateral osseous foraminal narrowing is worse on the right. Vertebral body heights and alignment are otherwise normal. The patient is status post median sternotomy. An unerupted right maxillary and I tooth is  present. An adjacent hypoplastic on are up to tooth is present. The patient is otherwise edentulous along the maxilla. Other neck: Soft tissue lipoma is present along the left occipital scalp. The soft tissues of the neck are unremarkable otherwise. Thyroid is within normal limits. Salivary glands are unremarkable. No significant adenopathy is present. Upper chest: Centrilobular emphysematous changes are present. No focal nodule or mass lesion is present. No significant pleural effusion is present. Review of the MIP images confirms the above findings CTA HEAD FINDINGS Anterior circulation: The internal carotid arteries are within normal limits bilaterally from the skull base through the ICA termini. The right A1 segment is dominant. The anterior communicating artery is patent. MCA bifurcation is normal bilaterally. The ACA and MCA branch vessels are within normal limits. Posterior circulation: The  vertebral arteries are codominant. Vertebrobasilar junction is normal. PICA origins are visualized and within normal limits. Both posterior cerebral arteries originate from the basilar tip. There is mild irregularity of distal branch vessels without a significant proximal stenosis or occlusion. Venous sinuses: The dural sinuses are patent. Right transverse sinus is dominant. The straight sinus and deep cerebral veins are intact. Cortical veins are unremarkable. Anatomic variants: None Delayed phase: Post-contrast images demonstrate no pathologic enhancement. Review of the MIP images confirms the above findings IMPRESSION: 1. High-grade stenosis of the mid left common carotid artery measuring greater than 75% relative to the more distal vessels. 2. Additional soft tissue plaque in the posterior wall of the proximal left internal carotid artery without focal stenosis. 3. Focal calcification in the posterior wall of the distal internal carotid artery bilaterally just below the skull base. This could be related to remote  trauma. There is no lumen compromise associated. 4. No focal disease at the ophthalmic segment on the left. 5. Scattered white matter hypoattenuation bilaterally likely reflects the sequela of chronic microvascular ischemia. 6. No acute cortical infarct. These results were called by telephone at the time of interpretation on 11/04/2017 at 3:58 pm to [facility], [place] , who verbally acknowledged these results. Electronically Signed   By: San Morelle M.D.   On: 11/04/2017 16:02   Ct Head Wo Contrast  Result Date: 11/04/2017 CLINICAL DATA:  New onset of vision loss in the left eye. The patient has no perception of light in the left eye. He began to have diminished vision in the left eye last evening. He awoke with no vision in the left eye today. EXAM: CT ANGIOGRAPHY HEAD AND NECK TECHNIQUE: Multidetector CT imaging of the head and neck was performed using the standard protocol during bolus administration of intravenous contrast. Multiplanar CT image reconstructions and MIPs were obtained to evaluate the vascular anatomy. Carotid stenosis measurements (when applicable) are obtained utilizing NASCET criteria, using the distal internal carotid diameter as the denominator. CONTRAST:  170m ISOVUE-370 IOPAMIDOL (ISOVUE-370) INJECTION 76% COMPARISON:  None. FINDINGS: CT HEAD FINDINGS Brain: Noncontrast imaging the brain demonstrates scattered periventricular white matter hypoattenuation bilaterally. In age indeterminate lacunar infarct is present within the anterior limb of the left internal capsule. A ridging is white matter hypoattenuation is present in the inferior cerebellum bilaterally. No acute hemorrhage or mass lesion is present. The ventricles are of normal size. No significant extra-axial fluid collection is present. Vascular: No hyperdense vessel or unexpected calcification. Skull: Calvarium is intact. No focal lytic or blastic lesions are present. Sinuses: The paranasal sinuses and mastoid air  cells are clear. Orbits: Bilateral globes and orbits are within normal limits. Review of the MIP images confirms the above findings CTA NECK FINDINGS Aortic arch: A 3 vessel arch configuration is present. Atherosclerotic calcifications are present without significant stenosis at the origins the great vessels. There is no aneurysm. Right carotid system: The right common carotid artery is within normal limits. Mild tortuosity is present in the proximal cervical right ICA. There is focal calcification along the posterior wall of the distal right ICA. No stenosis is present. Left carotid system: The left common carotid artery is within normal limits proximally. There is focal stenosis of the mid left common carotid artery. The lumen is narrowed to 1.6 mm. This compares with a more distal segment of 6.5 mm. Soft tissue plaque is noted along the posterior aspect of the left carotid bulb without significant luminal stenosis. Focal calcification is present along the  posterior wall of the distal left internal carotid artery just below the skull base without significant stenosis. Vertebral arteries: The vertebral arteries are codominant. Both vertebral arteries originate from the subclavian arteries without significant stenoses. There is no significant stenosis of either vertebral artery in the neck. Skeleton: Focal endplate changes are present with uncovertebral spurring at C6-7. Bilateral osseous foraminal narrowing is worse on the right. Vertebral body heights and alignment are otherwise normal. The patient is status post median sternotomy. An unerupted right maxillary and I tooth is present. An adjacent hypoplastic on are up to tooth is present. The patient is otherwise edentulous along the maxilla. Other neck: Soft tissue lipoma is present along the left occipital scalp. The soft tissues of the neck are unremarkable otherwise. Thyroid is within normal limits. Salivary glands are unremarkable. No significant adenopathy is  present. Upper chest: Centrilobular emphysematous changes are present. No focal nodule or mass lesion is present. No significant pleural effusion is present. Review of the MIP images confirms the above findings CTA HEAD FINDINGS Anterior circulation: The internal carotid arteries are within normal limits bilaterally from the skull base through the ICA termini. The right A1 segment is dominant. The anterior communicating artery is patent. MCA bifurcation is normal bilaterally. The ACA and MCA branch vessels are within normal limits. Posterior circulation: The vertebral arteries are codominant. Vertebrobasilar junction is normal. PICA origins are visualized and within normal limits. Both posterior cerebral arteries originate from the basilar tip. There is mild irregularity of distal branch vessels without a significant proximal stenosis or occlusion. Venous sinuses: The dural sinuses are patent. Right transverse sinus is dominant. The straight sinus and deep cerebral veins are intact. Cortical veins are unremarkable. Anatomic variants: None Delayed phase: Post-contrast images demonstrate no pathologic enhancement. Review of the MIP images confirms the above findings IMPRESSION: 1. High-grade stenosis of the mid left common carotid artery measuring greater than 75% relative to the more distal vessels. 2. Additional soft tissue plaque in the posterior wall of the proximal left internal carotid artery without focal stenosis. 3. Focal calcification in the posterior wall of the distal internal carotid artery bilaterally just below the skull base. This could be related to remote trauma. There is no lumen compromise associated. 4. No focal disease at the ophthalmic segment on the left. 5. Scattered white matter hypoattenuation bilaterally likely reflects the sequela of chronic microvascular ischemia. 6. No acute cortical infarct. These results were called by telephone at the time of interpretation on 11/04/2017 at 3:58 pm to  [facility], [place] , who verbally acknowledged these results. Electronically Signed   By: San Morelle M.D.   On: 11/04/2017 16:02   Ct Angio Neck W And/or Wo Contrast  Result Date: 11/04/2017 CLINICAL DATA:  New onset of vision loss in the left eye. The patient has no perception of light in the left eye. He began to have diminished vision in the left eye last evening. He awoke with no vision in the left eye today. EXAM: CT ANGIOGRAPHY HEAD AND NECK TECHNIQUE: Multidetector CT imaging of the head and neck was performed using the standard protocol during bolus administration of intravenous contrast. Multiplanar CT image reconstructions and MIPs were obtained to evaluate the vascular anatomy. Carotid stenosis measurements (when applicable) are obtained utilizing NASCET criteria, using the distal internal carotid diameter as the denominator. CONTRAST:  138m ISOVUE-370 IOPAMIDOL (ISOVUE-370) INJECTION 76% COMPARISON:  None. FINDINGS: CT HEAD FINDINGS Brain: Noncontrast imaging the brain demonstrates scattered periventricular white matter hypoattenuation bilaterally. In  age indeterminate lacunar infarct is present within the anterior limb of the left internal capsule. A ridging is white matter hypoattenuation is present in the inferior cerebellum bilaterally. No acute hemorrhage or mass lesion is present. The ventricles are of normal size. No significant extra-axial fluid collection is present. Vascular: No hyperdense vessel or unexpected calcification. Skull: Calvarium is intact. No focal lytic or blastic lesions are present. Sinuses: The paranasal sinuses and mastoid air cells are clear. Orbits: Bilateral globes and orbits are within normal limits. Review of the MIP images confirms the above findings CTA NECK FINDINGS Aortic arch: A 3 vessel arch configuration is present. Atherosclerotic calcifications are present without significant stenosis at the origins the great vessels. There is no aneurysm.  Right carotid system: The right common carotid artery is within normal limits. Mild tortuosity is present in the proximal cervical right ICA. There is focal calcification along the posterior wall of the distal right ICA. No stenosis is present. Left carotid system: The left common carotid artery is within normal limits proximally. There is focal stenosis of the mid left common carotid artery. The lumen is narrowed to 1.6 mm. This compares with a more distal segment of 6.5 mm. Soft tissue plaque is noted along the posterior aspect of the left carotid bulb without significant luminal stenosis. Focal calcification is present along the posterior wall of the distal left internal carotid artery just below the skull base without significant stenosis. Vertebral arteries: The vertebral arteries are codominant. Both vertebral arteries originate from the subclavian arteries without significant stenoses. There is no significant stenosis of either vertebral artery in the neck. Skeleton: Focal endplate changes are present with uncovertebral spurring at C6-7. Bilateral osseous foraminal narrowing is worse on the right. Vertebral body heights and alignment are otherwise normal. The patient is status post median sternotomy. An unerupted right maxillary and I tooth is present. An adjacent hypoplastic on are up to tooth is present. The patient is otherwise edentulous along the maxilla. Other neck: Soft tissue lipoma is present along the left occipital scalp. The soft tissues of the neck are unremarkable otherwise. Thyroid is within normal limits. Salivary glands are unremarkable. No significant adenopathy is present. Upper chest: Centrilobular emphysematous changes are present. No focal nodule or mass lesion is present. No significant pleural effusion is present. Review of the MIP images confirms the above findings CTA HEAD FINDINGS Anterior circulation: The internal carotid arteries are within normal limits bilaterally from the skull  base through the ICA termini. The right A1 segment is dominant. The anterior communicating artery is patent. MCA bifurcation is normal bilaterally. The ACA and MCA branch vessels are within normal limits. Posterior circulation: The vertebral arteries are codominant. Vertebrobasilar junction is normal. PICA origins are visualized and within normal limits. Both posterior cerebral arteries originate from the basilar tip. There is mild irregularity of distal branch vessels without a significant proximal stenosis or occlusion. Venous sinuses: The dural sinuses are patent. Right transverse sinus is dominant. The straight sinus and deep cerebral veins are intact. Cortical veins are unremarkable. Anatomic variants: None Delayed phase: Post-contrast images demonstrate no pathologic enhancement. Review of the MIP images confirms the above findings IMPRESSION: 1. High-grade stenosis of the mid left common carotid artery measuring greater than 75% relative to the more distal vessels. 2. Additional soft tissue plaque in the posterior wall of the proximal left internal carotid artery without focal stenosis. 3. Focal calcification in the posterior wall of the distal internal carotid artery bilaterally just below the skull  base. This could be related to remote trauma. There is no lumen compromise associated. 4. No focal disease at the ophthalmic segment on the left. 5. Scattered white matter hypoattenuation bilaterally likely reflects the sequela of chronic microvascular ischemia. 6. No acute cortical infarct. These results were called by telephone at the time of interpretation on 11/04/2017 at 3:58 pm to [facility], [place] , who verbally acknowledged these results. Electronically Signed   By: San Morelle M.D.   On: 11/04/2017 16:02    Pending Labs Unresulted Labs (From admission, onward)   Start     Ordered   11/04/17 2346  Urine culture  Once,   R     11/04/17 2345   11/04/17 1652  Urinalysis, Routine w reflex  microscopic  Once,   R     11/04/17 1651   11/04/17 1549  Vitamin B12  (Anemia Panel (PNL))  STAT,   STAT     11/04/17 1548   11/04/17 1549  Folate  (Anemia Panel (PNL))  STAT,   STAT     11/04/17 1548   11/04/17 1549  Iron and TIBC  (Anemia Panel (PNL))  STAT,   STAT     11/04/17 1548   11/04/17 1549  Ferritin  (Anemia Panel (PNL))  STAT,   STAT     11/04/17 1548   11/04/17 1354  C-reactive protein  Once,   STAT     11/04/17 1354   Signed and Held  HIV antibody (Routine Testing)  Once,   R     Signed and Held   Signed and Held  Hemoglobin A1c  Tomorrow morning,   R     Signed and Held   Signed and Held  Lipid panel  Tomorrow morning,   R    Comments:  Fasting    Signed and Held   Signed and Held  TSH  Add-on,   R     Signed and Held   Signed and Held  Occult blood card to lab, stool  Once,   R     Signed and Held   Signed and Held  Comprehensive metabolic panel  Tomorrow morning,   R     Signed and Held   Signed and Held  CBC  Tomorrow morning,   R     Signed and Held   Signed and Held  Protime-INR  Tomorrow morning,   R     Signed and Held      Vitals/Pain Today's Vitals   11/04/17 2151 11/04/17 2247 11/04/17 2251 11/04/17 2318  BP: (!) 116/49 (!) 101/49 (!) 106/50 (!) 103/59  Pulse: 76 71 72 71  Resp: 17 (!) 23 18 (!) 21  Temp:   98.5 F (36.9 C) 98.3 F (36.8 C)  TempSrc:   Oral Oral  SpO2: 100% 100%  100%  Weight:      Height:      PainSc:        Isolation Precautions No active isolations  Medications Medications  iopamidol (ISOVUE-370) 76 % injection (has no administration in time range)  ondansetron (ZOFRAN) tablet 4 mg (has no administration in time range)    Or  ondansetron (ZOFRAN) injection 4 mg (has no administration in time range)  LORazepam (ATIVAN) injection 2-3 mg (has no administration in time range)  pantoprazole (PROTONIX) injection 40 mg (40 mg Intravenous Given 11/04/17 1804)  albuterol (PROVENTIL) (2.5 MG/3ML) 0.083% nebulizer solution  2.5 mg (has no administration in time range)  potassium chloride  SA (K-DUR,KLOR-CON) CR tablet 40 mEq (40 mEq Oral Given 11/04/17 1501)  iopamidol (ISOVUE-370) 76 % injection 100 mL (100 mLs Intravenous Contrast Given 11/04/17 1520)  0.9 %  sodium chloride infusion (10 mL/hr Intravenous New Bag/Given 11/04/17 1746)  potassium chloride SA (K-DUR,KLOR-CON) CR tablet 40 mEq (40 mEq Oral Given 11/04/17 1850)    Mobility walks

## 2017-11-05 NOTE — Progress Notes (Signed)
PT Cancellation Note  Patient Details Name: [name] MRN: [id number] DOB: [birth year]   Cancelled Treatment:    Reason Eval/Treat Not Completed: Patient at procedure or test/unavailable. Pt now down for MRI.   [place] 11/05/2017, 4:45 PM [place]

## 2017-11-05 NOTE — Op Note (Signed)
[facility] Patient Name: [name] Procedure Date : 11/05/2017 MRN: [id number] Attending MD: Lear Ng , MD Date of Birth: 1955-11-27 CSN: [id number] Age: 62 Admit Type: Inpatient Procedure:                Upper GI endoscopy Indications:              Suspected upper gastrointestinal bleeding, Iron                            deficiency anemia, Coffee-ground emesis, Melena Providers:                Lear Ng, MD, Zenon Mayo, RN,                            William Dalton, Technician Referring MD:             hospital team Medicines:                Propofol per Anesthesia, Monitored Anesthesia Care Complications:            No immediate complications. Estimated Blood Loss:     Estimated blood loss was minimal. Procedure:                Pre-Anesthesia Assessment:                           - Prior to the procedure, a History and Physical                            was performed, and patient medications and                            allergies were reviewed. The patient's tolerance of                            previous anesthesia was also reviewed. The risks                            and benefits of the procedure and the sedation                            options and risks were discussed with the patient.                            All questions were answered, and informed consent                            was obtained. Prior Anticoagulants: The patient has                            taken no previous anticoagulant or antiplatelet                            agents. ASA Grade Assessment: III - A patient with  severe systemic disease. After reviewing the risks                            and benefits, the patient was deemed in                            satisfactory condition to undergo the procedure.                           After obtaining informed consent, the endoscope was                            passed under direct  vision. Throughout the                            procedure, the patient's blood pressure, pulse, and                            oxygen saturations were monitored continuously. The                            EG-[id number] ([id number]) scope was introduced through the                            mouth, and advanced to the second part of duodenum.                            The upper GI endoscopy was accomplished without                            difficulty. The patient tolerated the procedure                            well. Scope In: Scope Out: Findings:      The Z-line was regular and was found 40 cm from the incisors.      A deformity was found in the prepyloric region of the stomach likely due       to previous ulcer surgery (remote history).      Localized mild mucosal changes characterized by congestion were found in       the prepyloric region of the stomach. Biopsies were taken with a cold       forceps for histology. Estimated blood loss was minimal.      A large amount of food (residue) was found in the gastric fundus       preventing visualization of the fundus.      The examined duodenum was normal.      There is no endoscopic evidence of bleeding in the entire examined       stomach but proximal stomach visualization limited by food products.      Patchy, white plaques were found in the middle third of the esophagus. Impression:               - Z-line regular, 40 cm from the incisors.                           -  Post-surgical deformity in the prepyloric region                            of the stomach.                           - Congested mucosa in the prepyloric region of the                            stomach. Biopsied.                           - A large amount of food (residue) in the stomach.                           - Normal examined duodenum.                           - Esophageal plaques were found, consistent with                            candidiasis. Recommendation:            - NPO.                           - Await pathology results.                           - Observe patient's clinical course.                           - Start Nystatin swish and swallow when tolerating                            POs. Procedure Code(s):        --- Professional ---                           [phone], Esophagogastroduodenoscopy, flexible,                            transoral; with biopsy, single or multiple Diagnosis Code(s):        --- Professional ---                           D50.9, Iron deficiency anemia, unspecified                           K92.1, Melena (includes Hematochezia)                           K92.0, Hematemesis                           K22.9, Disease of esophagus, unspecified                           K31.89, Other diseases of  stomach and duodenum                           K91.89, Other postprocedural complications and                            disorders of digestive system CPT copyright 2017 [employer]. All rights reserved. The codes documented in this report are preliminary and upon coder review may  be revised to meet current compliance requirements. Lear Ng, MD 11/05/2017 11:55:29 AM This report has been signed electronically. Number of Addenda: 0

## 2017-11-05 NOTE — ED Notes (Signed)
1st unit PRBC transfusion completed without transfusion reaction, latest V/S within normal ranges.

## 2017-11-05 NOTE — Interval H&P Note (Signed)
History and Physical Interval Note:  11/05/2017 11:25 AM  [name]  has presented today for surgery, with the diagnosis of melena  The various methods of treatment have been discussed with the patient and family. After consideration of risks, benefits and other options for treatment, the patient has consented to  Procedure(s): ESOPHAGOGASTRODUODENOSCOPY (EGD) WITH PROPOFOL (N/A) as a surgical intervention .  The patient's history has been reviewed, patient examined, no change in status, stable for surgery.  I have reviewed the patient's chart and labs.  Questions were answered to the patient's satisfaction.     Keller C.

## 2017-11-06 ENCOUNTER — Inpatient Hospital Stay (HOSPITAL_COMMUNITY): Payer: Self-pay | Admitting: Certified Registered"

## 2017-11-06 ENCOUNTER — Encounter (HOSPITAL_COMMUNITY): Payer: Self-pay | Admitting: Certified Registered"

## 2017-11-06 ENCOUNTER — Encounter (HOSPITAL_COMMUNITY)
Admission: EM | Disposition: A | Discharge status: Home / Self Care | Payer: Self-pay | Source: Home / Self Care | Attending: Internal Medicine

## 2017-11-06 DIAGNOSIS — I6522 Occlusion and stenosis of left carotid artery: Secondary | ICD-10-CM

## 2017-11-06 DIAGNOSIS — I639 Cerebral infarction, unspecified: Secondary | ICD-10-CM

## 2017-11-06 HISTORY — PX: ENDARTERECTOMY: SHX5162

## 2017-11-06 LAB — TYPE AND SCREEN
ABO/RH(D): O POS
ABO/RH(D): O POS
Antibody Screen: NEGATIVE
Antibody Screen: NEGATIVE
Unit division: 0
Unit division: 0
Unit division: 0
Unit division: 0

## 2017-11-06 LAB — URINE CULTURE: CULTURE: NO GROWTH

## 2017-11-06 LAB — CBC
HCT: 28.3 % — ABNORMAL LOW (ref 39.0–52.0)
HEMOGLOBIN: 9.1 g/dL — AB (ref 13.0–17.0)
MCH: 25.2 pg — AB (ref 26.0–34.0)
MCHC: 32.2 g/dL (ref 30.0–36.0)
MCV: 78.4 fL (ref 78.0–100.0)
Platelets: 278 10*3/uL (ref 150–400)
RBC: 3.61 MIL/uL — AB (ref 4.22–5.81)
RDW: 19.1 % — ABNORMAL HIGH (ref 11.5–15.5)
WBC: 10 10*3/uL (ref 4.0–10.5)

## 2017-11-06 LAB — BPAM RBC
BLOOD PRODUCT EXPIRATION DATE: 201906232359
Blood Product Expiration Date: 201906232359
Blood Product Expiration Date: 201906022359
Blood Product Expiration Date: 201906022359
ISSUE DATE / TIME: 201905251736
ISSUE DATE / TIME: 201905252258
ISSUE DATE / TIME: 201905261027
ISSUE DATE / TIME: 201905261751
UNIT TYPE AND RH: 5100
UNIT TYPE AND RH: 5100
Unit Type and Rh: 5100
Unit Type and Rh: 5100

## 2017-11-06 LAB — BASIC METABOLIC PANEL
ANION GAP: 6 (ref 5–15)
BUN: 5 mg/dL — ABNORMAL LOW (ref 6–20)
CALCIUM: 8.2 mg/dL — AB (ref 8.9–10.3)
CO2: 21 mmol/L — AB (ref 22–32)
Chloride: 111 mmol/L (ref 101–111)
Creatinine, Ser: 0.93 mg/dL (ref 0.61–1.24)
GFR calc Af Amer: >60 mL/min (ref >60)
GFR calc non Af Amer: >60 mL/min (ref >60)
Glucose, Bld: 96 mg/dL (ref 65–99)
Potassium: 3.8 mmol/L (ref 3.5–5.1)
Sodium: 138 mmol/L (ref 135–145)

## 2017-11-06 LAB — MRSA CULTURE: Culture: NOT DETECTED

## 2017-11-06 LAB — GLUCOSE, CAPILLARY: GLUCOSE-CAPILLARY: 96 mg/dL (ref 65–99)

## 2017-11-06 SURGERY — ENDARTERECTOMY, CAROTID
Anesthesia: General | Site: Neck | Laterality: Left

## 2017-11-06 MED ORDER — PROMETHAZINE HCL 25 MG/ML IJ SOLN: 6.2500 mg | INTRAMUSCULAR | Status: DC | PRN

## 2017-11-06 MED ORDER — POTASSIUM CHLORIDE CRYS ER 20 MEQ PO TBCR: 20.0000 meq | EXTENDED_RELEASE_TABLET | Freq: Every day | ORAL | Status: DC | PRN

## 2017-11-06 MED ORDER — MAGNESIUM SULFATE 2 GM/50ML IV SOLN: 2.0000 g | Freq: Every day | INTRAVENOUS | Status: DC | PRN

## 2017-11-06 MED ORDER — PHENYLEPHRINE HCL 10 MG/ML IJ SOLN
INTRAMUSCULAR | Status: DC | PRN
  Administered 2017-11-06: 50 ug/min via INTRAVENOUS

## 2017-11-06 MED ORDER — FENTANYL CITRATE (PF) 250 MCG/5ML IJ SOLN
INTRAMUSCULAR | Status: AC
  Filled 2017-11-06: qty 5

## 2017-11-06 MED ORDER — MORPHINE SULFATE (PF) 2 MG/ML IV SOLN: 2.0000 mg | INTRAVENOUS | Status: DC | PRN

## 2017-11-06 MED ORDER — ONDANSETRON HCL 4 MG/2ML IJ SOLN
INTRAMUSCULAR | Status: AC
  Filled 2017-11-06: qty 2

## 2017-11-06 MED ORDER — DOCUSATE SODIUM 100 MG PO CAPS
100.0000 mg | ORAL_CAPSULE | Freq: Every day | ORAL | Status: DC
  Administered 2017-11-07 – 2017-11-08 (×2): 100 mg via ORAL
  Filled 2017-11-06 (×3): qty 1

## 2017-11-06 MED ORDER — SUGAMMADEX SODIUM 200 MG/2ML IV SOLN
INTRAVENOUS | Status: AC
  Filled 2017-11-06: qty 2

## 2017-11-06 MED ORDER — GUAIFENESIN-DM 100-10 MG/5ML PO SYRP
15.0000 mL | ORAL_SOLUTION | ORAL | Status: DC | PRN
  Administered 2017-11-06: 15 mL via ORAL
  Filled 2017-11-06: qty 15

## 2017-11-06 MED ORDER — DEXAMETHASONE SODIUM PHOSPHATE 10 MG/ML IJ SOLN
INTRAMUSCULAR | Status: DC | PRN
  Administered 2017-11-06: 10 mg via INTRAVENOUS

## 2017-11-06 MED ORDER — ASPIRIN 325 MG PO TABS
325.0000 mg | ORAL_TABLET | Freq: Every day | ORAL | Status: DC
  Administered 2017-11-06 – 2017-11-07 (×2): 325 mg via ORAL
  Filled 2017-11-06 (×2): qty 1

## 2017-11-06 MED ORDER — HYDRALAZINE HCL 20 MG/ML IJ SOLN: 5.0000 mg | INTRAMUSCULAR | Status: DC | PRN

## 2017-11-06 MED ORDER — DEXTRAN 40 IN SALINE 10-0.9 % IV SOLN
INTRAVENOUS | Status: AC | PRN
  Administered 2017-11-06: 500 mL

## 2017-11-06 MED ORDER — ALUM & MAG HYDROXIDE-SIMETH 200-200-20 MG/5ML PO SUSP
15.0000 mL | ORAL | Status: DC | PRN
  Administered 2017-11-07: 30 mL via ORAL
  Filled 2017-11-06: qty 30

## 2017-11-06 MED ORDER — HEPARIN SODIUM (PORCINE) 5000 UNIT/ML IJ SOLN
INTRAMUSCULAR | Status: AC
  Filled 2017-11-06: qty 1.2

## 2017-11-06 MED ORDER — 0.9 % SODIUM CHLORIDE (POUR BTL) OPTIME
TOPICAL | Status: DC | PRN
  Administered 2017-11-06: 2000 mL

## 2017-11-06 MED ORDER — HYDROMORPHONE HCL 2 MG/ML IJ SOLN: 0.2500 mg | INTRAMUSCULAR | Status: DC | PRN

## 2017-11-06 MED ORDER — LIDOCAINE HCL 4 % EX SOLN
CUTANEOUS | Status: DC | PRN
  Administered 2017-11-06: 3 mL via TOPICAL

## 2017-11-06 MED ORDER — LABETALOL HCL 5 MG/ML IV SOLN: 10.0000 mg | INTRAVENOUS | Status: DC | PRN

## 2017-11-06 MED ORDER — HEPARIN SODIUM (PORCINE) 1000 UNIT/ML IJ SOLN
INTRAMUSCULAR | Status: DC | PRN
  Administered 2017-11-06: 9000 [IU] via INTRAVENOUS

## 2017-11-06 MED ORDER — BISACODYL 10 MG RE SUPP: 10.0000 mg | Freq: Every day | RECTAL | Status: DC | PRN

## 2017-11-06 MED ORDER — PROPOFOL 10 MG/ML IV BOLUS
INTRAVENOUS | Status: DC | PRN
  Administered 2017-11-06: 130 mg via INTRAVENOUS

## 2017-11-06 MED ORDER — DEXAMETHASONE SODIUM PHOSPHATE 10 MG/ML IJ SOLN
INTRAMUSCULAR | Status: AC
  Filled 2017-11-06: qty 1

## 2017-11-06 MED ORDER — LIDOCAINE HCL (CARDIAC) PF 100 MG/5ML IV SOSY
PREFILLED_SYRINGE | INTRAVENOUS | Status: DC | PRN
  Administered 2017-11-06: 60 mg via INTRAVENOUS

## 2017-11-06 MED ORDER — SODIUM CHLORIDE 0.9 % IV SOLN
510.0000 mg | Freq: Once | INTRAVENOUS | Status: AC
  Administered 2017-11-06: 510 mg via INTRAVENOUS
  Filled 2017-11-06: qty 17

## 2017-11-06 MED ORDER — ROCURONIUM BROMIDE 10 MG/ML (PF) SYRINGE
PREFILLED_SYRINGE | INTRAVENOUS | Status: AC
  Filled 2017-11-06: qty 5

## 2017-11-06 MED ORDER — OXYCODONE HCL 5 MG PO TABS
5.0000 mg | ORAL_TABLET | ORAL | Status: DC | PRN
  Administered 2017-11-06: 10 mg via ORAL
  Filled 2017-11-06: qty 2

## 2017-11-06 MED ORDER — CEFAZOLIN SODIUM-DEXTROSE 2-4 GM/100ML-% IV SOLN
2.0000 g | Freq: Three times a day (TID) | INTRAVENOUS | Status: AC
  Administered 2017-11-06 (×2): 2 g via INTRAVENOUS
  Filled 2017-11-06 (×3): qty 100

## 2017-11-06 MED ORDER — METOPROLOL TARTRATE 5 MG/5ML IV SOLN: 2.0000 mg | INTRAVENOUS | Status: DC | PRN

## 2017-11-06 MED ORDER — PROTAMINE SULFATE 10 MG/ML IV SOLN
INTRAVENOUS | Status: DC | PRN
  Administered 2017-11-06: 50 mg via INTRAVENOUS

## 2017-11-06 MED ORDER — FLEET ENEMA 7-19 GM/118ML RE ENEM: 1.0000 | ENEMA | Freq: Once | RECTAL | Status: DC | PRN

## 2017-11-06 MED ORDER — DEXTRAN 40 IN D5W 10 % IV SOLN
INTRAVENOUS | Status: AC
  Filled 2017-11-06: qty 500

## 2017-11-06 MED ORDER — SUGAMMADEX SODIUM 200 MG/2ML IV SOLN
INTRAVENOUS | Status: DC | PRN
  Administered 2017-11-06: 200 mg via INTRAVENOUS

## 2017-11-06 MED ORDER — LIDOCAINE HCL (PF) 1 % IJ SOLN
INTRAMUSCULAR | Status: AC
  Filled 2017-11-06: qty 30

## 2017-11-06 MED ORDER — LACTATED RINGERS IV SOLN
INTRAVENOUS | Status: DC | PRN
  Administered 2017-11-06 (×2): via INTRAVENOUS

## 2017-11-06 MED ORDER — LABETALOL HCL 5 MG/ML IV SOLN
INTRAVENOUS | Status: DC | PRN
  Administered 2017-11-06: 10 mg via INTRAVENOUS

## 2017-11-06 MED ORDER — PHENOL 1.4 % MT LIQD: 1.0000 | OROMUCOSAL | Status: DC | PRN

## 2017-11-06 MED ORDER — ROCURONIUM BROMIDE 100 MG/10ML IV SOLN
INTRAVENOUS | Status: DC | PRN
  Administered 2017-11-06: 60 mg via INTRAVENOUS
  Administered 2017-11-06: 10 mg via INTRAVENOUS

## 2017-11-06 MED ORDER — LIDOCAINE 2% (20 MG/ML) 5 ML SYRINGE
INTRAMUSCULAR | Status: AC
  Filled 2017-11-06: qty 5

## 2017-11-06 MED ORDER — PROPOFOL 10 MG/ML IV BOLUS
INTRAVENOUS | Status: AC
  Filled 2017-11-06: qty 20

## 2017-11-06 MED ORDER — HEMOSTATIC AGENTS (NO CHARGE) OPTIME
TOPICAL | Status: DC | PRN
  Administered 2017-11-06: 1 via TOPICAL

## 2017-11-06 MED ORDER — ONDANSETRON HCL 4 MG/2ML IJ SOLN
INTRAMUSCULAR | Status: DC | PRN
  Administered 2017-11-06: 4 mg via INTRAVENOUS

## 2017-11-06 MED ORDER — FENTANYL CITRATE (PF) 100 MCG/2ML IJ SOLN
INTRAMUSCULAR | Status: DC | PRN
  Administered 2017-11-06: 100 ug via INTRAVENOUS
  Administered 2017-11-06: 50 ug via INTRAVENOUS

## 2017-11-06 MED ORDER — ENOXAPARIN SODIUM 40 MG/0.4ML ~~LOC~~ SOLN
40.0000 mg | SUBCUTANEOUS | Status: DC
  Administered 2017-11-07: 40 mg via SUBCUTANEOUS
  Filled 2017-11-06 (×2): qty 0.4

## 2017-11-06 MED ORDER — SODIUM CHLORIDE 0.9 % IV SOLN
INTRAVENOUS | Status: DC | PRN
  Administered 2017-11-06: 11:00:00

## 2017-11-06 MED ORDER — CEFAZOLIN SODIUM-DEXTROSE 2-3 GM-%(50ML) IV SOLR
INTRAVENOUS | Status: DC | PRN
  Administered 2017-11-06: 2 g via INTRAVENOUS

## 2017-11-06 MED ORDER — SODIUM CHLORIDE 0.9 % IV SOLN: 500.0000 mL | Freq: Once | INTRAVENOUS | Status: DC | PRN

## 2017-11-06 MED ORDER — SODIUM CHLORIDE 0.9 % IV SOLN: INTRAVENOUS | Status: DC

## 2017-11-06 SURGICAL SUPPLY — 53 items
ADH SKN CLS APL DERMABOND .7 (GAUZE/BANDAGES/DRESSINGS) ×1
ADPR TBG 2 MALE LL ART (MISCELLANEOUS)
AGENT HMST SPONGE THK3/8 (HEMOSTASIS) ×1
BAG DECANTER FOR FLEXI CONT (MISCELLANEOUS) ×3 IMPLANT
CANISTER SUCT 3000ML PPV (MISCELLANEOUS) ×3 IMPLANT
CATH ROBINSON RED A/P 18FR (CATHETERS) ×3 IMPLANT
CLIP VESOCCLUDE MED 24/CT (CLIP) ×3 IMPLANT
CLIP VESOCCLUDE SM WIDE 24/CT (CLIP) ×3 IMPLANT
COVER PROBE W GEL 5X96 (DRAPES) IMPLANT
CRADLE DONUT ADULT HEAD (MISCELLANEOUS) ×3 IMPLANT
DERMABOND ADVANCED (GAUZE/BANDAGES/DRESSINGS) ×2
DERMABOND ADVANCED .7 DNX12 (GAUZE/BANDAGES/DRESSINGS) ×1 IMPLANT
ELECT REM PT RETURN 9FT ADLT (ELECTROSURGICAL) ×3
ELECTRODE REM PT RTRN 9FT ADLT (ELECTROSURGICAL) ×1 IMPLANT
GLOVE BIO SURGEON STRL SZ 6.5 (GLOVE) ×2 IMPLANT
GLOVE BIO SURGEON STRL SZ7 (GLOVE) ×3 IMPLANT
GLOVE BIO SURGEON STRL SZ7.5 (GLOVE) ×2 IMPLANT
GLOVE BIO SURGEONS STRL SZ 6.5 (GLOVE) ×2
GLOVE BIOGEL PI IND STRL 6.5 (GLOVE) IMPLANT
GLOVE BIOGEL PI IND STRL 7.5 (GLOVE) ×1 IMPLANT
GLOVE BIOGEL PI INDICATOR 6.5 (GLOVE) ×4
GLOVE BIOGEL PI INDICATOR 7.5 (GLOVE) ×2
GOWN STRL REUS W/ TWL LRG LVL3 (GOWN DISPOSABLE) ×3 IMPLANT
GOWN STRL REUS W/TWL LRG LVL3 (GOWN DISPOSABLE) ×9
HEMOSTAT SPONGE AVITENE ULTRA (HEMOSTASIS) ×2 IMPLANT
IV ADAPTER SYR DOUBLE MALE LL (MISCELLANEOUS) IMPLANT
KIT BASIN OR (CUSTOM PROCEDURE TRAY) ×3 IMPLANT
KIT SHUNT ARGYLE CAROTID ART 6 (VASCULAR PRODUCTS) ×2 IMPLANT
KIT TURNOVER KIT B (KITS) ×3 IMPLANT
NDL HYPO 25GX1X1/2 BEV (NEEDLE) IMPLANT
NEEDLE HYPO 25GX1X1/2 BEV (NEEDLE) IMPLANT
NS IRRIG 1000ML POUR BTL (IV SOLUTION) ×9 IMPLANT
PACK CAROTID (CUSTOM PROCEDURE TRAY) ×3 IMPLANT
PAD ARMBOARD 7.5X6 YLW CONV (MISCELLANEOUS) ×6 IMPLANT
PATCH VASC XENOSURE 1CMX6CM (Vascular Products) ×3 IMPLANT
PATCH VASC XENOSURE 1X6 (Vascular Products) ×1 IMPLANT
SET COLLECT BLD 21X3/4 12 PB (MISCELLANEOUS) IMPLANT
SHUNT CAROTID BYPASS 10 (VASCULAR PRODUCTS) IMPLANT
SHUNT CAROTID BYPASS 12FRX15.5 (VASCULAR PRODUCTS) IMPLANT
SUT ETHILON 3 0 PS 1 (SUTURE) IMPLANT
SUT MNCRL AB 4-0 PS2 18 (SUTURE) ×3 IMPLANT
SUT PROLENE 6 0 BV (SUTURE) ×7 IMPLANT
SUT PROLENE 7 0 BV 1 (SUTURE) IMPLANT
SUT SILK 3 0 (SUTURE)
SUT SILK 3-0 18XBRD TIE 12 (SUTURE) IMPLANT
SUT VIC AB 3-0 SH 27 (SUTURE) ×3
SUT VIC AB 3-0 SH 27X BRD (SUTURE) ×1 IMPLANT
SYR 20CC LL (SYRINGE) ×3 IMPLANT
SYR TB 1ML LUER SLIP (SYRINGE) IMPLANT
SYSTEM CHEST DRAIN TLS 7FR (DRAIN) IMPLANT
TOWEL GREEN STERILE (TOWEL DISPOSABLE) ×3 IMPLANT
TUBING ART PRESS 48 MALE/FEM (TUBING) IMPLANT
WATER STERILE IRR 1000ML POUR (IV SOLUTION) ×3 IMPLANT

## 2017-11-06 NOTE — Consult Note (Addendum)
New Carotid Patient  Requested by:  Dr. Alfredia Ferguson  Reason for consultation: left carotid stenosis   History of Present Illness   [name] is a 62 y.o. (1956-04-28) male who presents with chief complaint: loss of vision on left eye.   Reportedly patient has had a gradual loss of left eye vision over 2-3 days.  At this point, he has no vision in that eye.  Patient has no history of TIA or stroke symptom.  The patient has never had amaurosis fugax.  The patient has never had facial drooping or hemiplegia.  The patient has never had receptive or expressive aphasia.   The patient's previous neurologic deficits have NOT resolved.  The patient's risks factors for carotid disease include: HLD, HTN and active smoking.  Additionally, pt has a h/o GI bleeding and UDS on admission was + for cocaine and THC.  Pt notes also 1 week prior history of coffee ground emesis and darker stools.   Past Medical History:  Diagnosis Date  . Anginal pain ([place])   . CAD (coronary artery disease)    4V CABG 10/31/13  . Cocaine abuse ([place])   . COPD (chronic obstructive pulmonary disease) ([place])   . GERD (gastroesophageal reflux disease)   . H/O: eczema    lower extremities  . H/O: GI bleed    a. 62 y/o ago, r/t ulcer. Denies recent GIB.  Marland Kitchen High cholesterol   . Hypertension   . Myocardial infarction ([place])   . Perforation of duodenal ulcer ([place])    a. 1970s - s/p surgery.   . Shortness of breath dyspnea   . Tobacco abuse     Past Surgical History:  Procedure Laterality Date  . BIOPSY  11/05/2017   Procedure: BIOPSY;  Surgeon: Wilford Corner, MD;  Location: [place];  Service: Endoscopy;;  . CORONARY ARTERY BYPASS GRAFT N/A 10/31/2013   Procedure: CORONARY ARTERY BYPASS GRAFTING TIMES FOUR ON PUMP USING LEFT INTERNAL MAMMARY ARTERY AND RIGHT GREATER SAPHENOUS VEIN VIA ENDOVEIN HARVEST.;  Surgeon: Melrose Nakayama, MD;  Location: [place];  Service: Open Heart Surgery;  Laterality: N/A;  .  ESOPHAGOGASTRODUODENOSCOPY (EGD) WITH PROPOFOL N/A 11/05/2017   Procedure: ESOPHAGOGASTRODUODENOSCOPY (EGD) WITH PROPOFOL;  Surgeon: Wilford Corner, MD;  Location: [place];  Service: Endoscopy;  Laterality: N/A;  . INTRAOPERATIVE TRANSESOPHAGEAL ECHOCARDIOGRAM N/A 10/31/2013   Procedure: INTRAOPERATIVE TRANSESOPHAGEAL ECHOCARDIOGRAM;  Surgeon: Melrose Nakayama, MD;  Location: [place];  Service: Open Heart Surgery;  Laterality: N/A;  . LEFT HEART CATHETERIZATION WITH CORONARY ANGIOGRAM N/A 10/28/2013   Procedure: LEFT HEART CATHETERIZATION WITH CORONARY ANGIOGRAM;  Surgeon: Burnell Blanks, MD;  Location: [facility] C[facility];  Service: Cardiovascular;  Laterality: N/A;  . stomach sx      Social History   Socioeconomic History  . Marital status: Single    Spouse name: Not on file  . Number of children: Not on file  . Years of education: Not on file  . Highest education level: Not on file  Occupational History  . Not on file  Social Needs  . Financial resource strain: Not on file  . Food insecurity:    Worry: Not on file    Inability: Not on file  . Transportation needs:    Medical: Not on file    Non-medical: Not on file  Tobacco Use  . Smoking status: Current Every Day Smoker    Packs/day: 0.50    Years: 40.00    Pack years: 20.00  Types: Cigarettes  . Smokeless tobacco: Never Used  Substance and Sexual Activity  . Alcohol use: Yes    Alcohol/week: 1.2 oz    Types: 2 Cans of beer per week    Comment: 6 pack over the weekend   . Drug use: Yes    Types: Marijuana, Cocaine    Comment: "about 1 marijuana cigarette a week"  . Sexual activity: Yes    Birth control/protection: None  Lifestyle  . Physical activity:    Days per week: Not on file    Minutes per session: Not on file  . Stress: Not on file  Relationships  . Social connections:    Talks on phone: Not on file    Gets together: Not on file    Attends religious service: Not on file    Active member of  club or organization: Not on file    Attends meetings of clubs or organizations: Not on file    Relationship status: Not on file  . Intimate partner violence:    Fear of current or ex partner: Not on file    Emotionally abused: Not on file    Physically abused: Not on file    Forced sexual activity: Not on file  Other Topics Concern  . Not on file  Social History Narrative  . Not on file    Family History  Problem Relation Age of Onset  . Lung cancer Unknown     Current Facility-Administered Medications  Medication Dose Route Frequency Provider Last Rate Last Dose  . 0.9 % NaCl with KCl 40 mEq / L  infusion   Intravenous Continuous Wilford Corner, MD 75 mL/hr at 11/05/17 2332 75 mL/hr at 11/05/17 2332  . acetaminophen (TYLENOL) tablet 650 mg  650 mg Oral Q6H PRN Wilford Corner, MD       Or  . acetaminophen (TYLENOL) suppository 650 mg  650 mg Rectal Q6H PRN Wilford Corner, MD      . albuterol (PROVENTIL) (2.5 MG/3ML) 0.083% nebulizer solution 2.5 mg  2.5 mg Nebulization Q6H PRN Wilford Corner, MD   2.5 mg at 11/05/17 2159  . atorvastatin (LIPITOR) tablet 80 mg  80 mg Oral q1800 Wilford Corner, MD   80 mg at 11/05/17 1722  . cyanocobalamin ((VITAMIN B-12)) injection 1,000 mcg  1,000 mcg Intramuscular Daily [place], Silver Huguenin, MD   1,000 mcg at 11/05/17 1722  . fluticasone (FLONASE) 50 MCG/ACT nasal spray 2 spray  2 spray Each Nare Daily PRN Wilford Corner, MD      . fluticasone furoate-vilanterol (BREO ELLIPTA) 200-25 MCG/INH 1 puff  1 puff Inhalation Daily Wilford Corner, MD   1 puff at 11/05/17 0907  . folic acid (FOLVITE) tablet 1 mg  1 mg Oral Daily Wilford Corner, MD   1 mg at 11/05/17 0953  . LORazepam (ATIVAN) injection 2-3 mg  2-3 mg Intravenous Q1H PRN Wilford Corner, MD      . multivitamin with minerals tablet 1 tablet  1 tablet Oral Daily Wilford Corner, MD   1 tablet at 11/05/17 0953  . nicotine (NICODERM CQ - dosed in mg/24 hours) patch 14  mg  14 mg Transdermal Daily Wilford Corner, MD   14 mg at 11/05/17 0954  . ondansetron (ZOFRAN) tablet 4 mg  4 mg Oral Q6H PRN Wilford Corner, MD       Or  . ondansetron [facility]) injection 4 mg  4 mg Intravenous Q6H PRN Wilford Corner, MD      . pantoprazole (  PROTONIX) 80 mg in sodium chloride 0.9 % 250 mL (0.32 mg/mL) infusion  8 mg/hr Intravenous Continuous Wilford Corner, MD 25 mL/hr at 11/05/17 1008 8 mg/hr at 11/05/17 1008  . [START ON 11/08/2017] pantoprazole (PROTONIX) injection 40 mg  40 mg Intravenous Q12H Wilford Corner, MD      . polyethylene glycol (MIRALAX / GLYCOLAX) packet 17 g  17 g Oral Daily PRN Wilford Corner, MD      . thiamine (VITAMIN B-1) tablet 100 mg  100 mg Oral Daily Wilford Corner, MD   100 mg at 11/05/17 0954  . traMADol (ULTRAM) tablet 50 mg  50 mg Oral Q6H PRN Wilford Corner, MD        No Known Allergies  REVIEW OF SYSTEMS (negative unless checked):   Cardiac:  []  Chest pain or chest pressure? []  Shortness of breath upon activity? []  Shortness of breath when lying flat? []  Irregular heart rhythm?  Vascular:  []  Pain in calf, thigh, or hip brought on by walking? []  Pain in feet at night that wakes you up from your sleep? []  Blood clot in your veins? []  Leg swelling?  Pulmonary:  []  Oxygen at home? []  Productive cough? []  Wheezing?  Neurologic:  []  Sudden weakness in arms or legs? []  Sudden numbness in arms or legs? []  Sudden onset of difficult speaking or slurred speech? [x]  Loss of vision in one eye? []  Problems with dizziness?  Gastrointestinal:  []  Blood in stool? []  Vomited blood?  Genitourinary:  []  Burning when urinating? []  Blood in urine?  Psychiatric:  []  Major depression  Hematologic:  []  Bleeding problems? []  Problems with blood clotting?  Dermatologic:  []  Rashes or ulcers?  Constitutional:  []  Fever or chills?  Ear/Nose/Throat:  []  Change in hearing? []  Nose bleeds? []  Sore  throat?  Musculoskeletal:  []  Back pain? []  Joint pain? []  Muscle pain?   For VQI Use Only   PRE-ADM LIVING Home  AMB STATUS Ambulatory  CAD Sx History of MI, but no symptoms No MI within 6 months  PRIOR CHF None  STRESS TEST No    Physical Examination     Vitals:   11/05/17 1810 11/05/17 2046 11/05/17 2200 11/05/17 2212  BP: 118/65 117/67  108/63  Pulse: 70 71    Resp: 14 (!) 23  16  Temp: 97.6 F (36.4 C) 98.7 F (37.1 C)  98.8 F (37.1 C)  TempSrc: Oral Oral  Oral  SpO2: 100% 100% 98% 100%  Weight:      Height:       Body mass index is 24.64 kg/m.  General Alert, O x 3, WD, NAD  Head [place]/AT,    Ear/Nose/ Throat Hearing grossly intact, nares without erythema or drainage, oropharynx without Erythema or Exudate, Mallampati score: 3,   Eyes No pupillary reaction in L, dilated R pupils, EOMI,    Neck Supple, mid-line trachea,    Pulmonary Sym exp, good B air movt, CTA B  Cardiac RRR, Nl S1, S2, no Murmurs, No rubs, No S3,S4  Vascular Vessel Right Left  Radial Palpable Palpable  Brachial Palpable Palpable  Carotid Palpable, No Bruit Palpable, No Bruit  Aorta Not palpable N/A  Femoral Palpable Palpable  Popliteal Not palpable Not palpable  PT Not palpable Not palpable  DP Faintly palpable Faintly palpable    Gastro- intestinal soft, non-distended, non-tender to palpation, No guarding or rebound, no HSM, no masses, no CVAT B, No palpable prominent aortic pulse,    Musculo- skeletal M/S  5/5 throughout  , Extremities without ischemic changes  , No edema present, No visible varicosities , No Lipodermatosclerosis present  Neurologic Cranial nerves 2-12 intact , Pain and light touch intact in extremities , Motor exam as listed above  Psychiatric Judgement intact, Mood & affect appropriate for pt's clinical situation  Dermatologic See M/S exam for extremity exam, No rashes otherwise noted  Lymphatic  Palpable lymph nodes: None    Laboratory   CBC CBC Latest Ref  Rng & Units 11/05/2017 11/05/2017 11/04/2017  WBC 4.0 - 10.5 K/uL - 7.1 -  Hemoglobin 13.0 - 17.0 g/dL 9.6(L) 7.2(L) 4.3(LL)  Hematocrit 39.0 - 52.0 % 30.1(L) 23.0(L) 14.4(L)  Platelets 150 - 400 K/uL - 305 -    BMP BMP Latest Ref Rng & Units 11/05/2017 11/04/2017 06/28/2017  Glucose 65 - 99 mg/dL 86 97 83  BUN 6 - 20 mg/dL 7 11 6(L)  Creatinine 0.61 - 1.24 mg/dL 0.95 1.05 1.13  BUN/Creat Ratio 10 - 24 - - 5(L)  Sodium 135 - 145 mmol/L 135 136 141  Potassium 3.5 - 5.1 mmol/L 3.5 2.9(L) 4.1  Chloride 101 - 111 mmol/L 104 101 101  CO2 22 - 32 mmol/L 22 24 24   Calcium 8.9 - 10.3 mg/dL 8.4(L) 8.5(L) 9.1    Coagulation Lab Results  Component Value Date   INR 1.01 11/05/2017   INR 0.93 11/04/2017   INR 1.07 07/02/2015   No results found for: PTT  Lipids    Component Value Date/Time   CHOL 97 11/05/2017 0253   CHOL 156 06/28/2017 1216   TRIG 57 11/05/2017 0253   HDL 44 11/05/2017 0253   HDL 72 06/28/2017 1216   CHOLHDL 2.2 11/05/2017 0253   VLDL 11 11/05/2017 0253   LDLCALC 42 11/05/2017 0253   LDLCALC 65 06/28/2017 1216   Radiology     Ct Angio Head W Or Wo Contrast  Result Date: 11/04/2017 CLINICAL DATA:  New onset of vision loss in the left eye. The patient has no perception of light in the left eye. He began to have diminished vision in the left eye last evening. He awoke with no vision in the left eye today. EXAM: CT ANGIOGRAPHY HEAD AND NECK TECHNIQUE: Multidetector CT imaging of the head and neck was performed using the standard protocol during bolus administration of intravenous contrast. Multiplanar CT image reconstructions and MIPs were obtained to evaluate the vascular anatomy. Carotid stenosis measurements (when applicable) are obtained utilizing NASCET criteria, using the distal internal carotid diameter as the denominator. CONTRAST:  168mL ISOVUE-370 IOPAMIDOL (ISOVUE-370) INJECTION 76% COMPARISON:  None. FINDINGS: CT HEAD FINDINGS Brain: Noncontrast imaging the brain  demonstrates scattered periventricular white matter hypoattenuation bilaterally. In age indeterminate lacunar infarct is present within the anterior limb of the left internal capsule. A ridging is white matter hypoattenuation is present in the inferior cerebellum bilaterally. No acute hemorrhage or mass lesion is present. The ventricles are of normal size. No significant extra-axial fluid collection is present. Vascular: No hyperdense vessel or unexpected calcification. Skull: Calvarium is intact. No focal lytic or blastic lesions are present. Sinuses: The paranasal sinuses and mastoid air cells are clear. Orbits: Bilateral globes and orbits are within normal limits. Review of the MIP images confirms the above findings CTA NECK FINDINGS Aortic arch: A 3 vessel arch configuration is present. Atherosclerotic calcifications are present without significant stenosis at the origins the great vessels. There is no aneurysm. Right carotid system: The right common carotid artery is within normal limits.  Mild tortuosity is present in the proximal cervical right ICA. There is focal calcification along the posterior wall of the distal right ICA. No stenosis is present. Left carotid system: The left common carotid artery is within normal limits proximally. There is focal stenosis of the mid left common carotid artery. The lumen is narrowed to 1.6 mm. This compares with a more distal segment of 6.5 mm. Soft tissue plaque is noted along the posterior aspect of the left carotid bulb without significant luminal stenosis. Focal calcification is present along the posterior wall of the distal left internal carotid artery just below the skull base without significant stenosis. Vertebral arteries: The vertebral arteries are codominant. Both vertebral arteries originate from the subclavian arteries without significant stenoses. There is no significant stenosis of either vertebral artery in the neck. Skeleton: Focal endplate changes are  present with uncovertebral spurring at C6-7. Bilateral osseous foraminal narrowing is worse on the right. Vertebral body heights and alignment are otherwise normal. The patient is status post median sternotomy. An unerupted right maxillary and I tooth is present. An adjacent hypoplastic on are up to tooth is present. The patient is otherwise edentulous along the maxilla. Other neck: Soft tissue lipoma is present along the left occipital scalp. The soft tissues of the neck are unremarkable otherwise. Thyroid is within normal limits. Salivary glands are unremarkable. No significant adenopathy is present. Upper chest: Centrilobular emphysematous changes are present. No focal nodule or mass lesion is present. No significant pleural effusion is present. Review of the MIP images confirms the above findings CTA HEAD FINDINGS Anterior circulation: The internal carotid arteries are within normal limits bilaterally from the skull base through the ICA termini. The right A1 segment is dominant. The anterior communicating artery is patent. MCA bifurcation is normal bilaterally. The ACA and MCA branch vessels are within normal limits. Posterior circulation: The vertebral arteries are codominant. Vertebrobasilar junction is normal. PICA origins are visualized and within normal limits. Both posterior cerebral arteries originate from the basilar tip. There is mild irregularity of distal branch vessels without a significant proximal stenosis or occlusion. Venous sinuses: The dural sinuses are patent. Right transverse sinus is dominant. The straight sinus and deep cerebral veins are intact. Cortical veins are unremarkable. Anatomic variants: None Delayed phase: Post-contrast images demonstrate no pathologic enhancement. Review of the MIP images confirms the above findings IMPRESSION: 1. High-grade stenosis of the mid left common carotid artery measuring greater than 75% relative to the more distal vessels. 2. Additional soft tissue  plaque in the posterior wall of the proximal left internal carotid artery without focal stenosis. 3. Focal calcification in the posterior wall of the distal internal carotid artery bilaterally just below the skull base. This could be related to remote trauma. There is no lumen compromise associated. 4. No focal disease at the ophthalmic segment on the left. 5. Scattered white matter hypoattenuation bilaterally likely reflects the sequela of chronic microvascular ischemia. 6. No acute cortical infarct. These results were called by telephone at the time of interpretation on 11/04/2017 at 3:58 pm to [facility], [place] , who verbally acknowledged these results. Electronically Signed   By: San Morelle M.D.   On: 11/04/2017 16:02   Ct Head Wo Contrast  Result Date: 11/04/2017 CLINICAL DATA:  New onset of vision loss in the left eye. The patient has no perception of light in the left eye. He began to have diminished vision in the left eye last evening. He awoke with no vision in the left  eye today. EXAM: CT ANGIOGRAPHY HEAD AND NECK TECHNIQUE: Multidetector CT imaging of the head and neck was performed using the standard protocol during bolus administration of intravenous contrast. Multiplanar CT image reconstructions and MIPs were obtained to evaluate the vascular anatomy. Carotid stenosis measurements (when applicable) are obtained utilizing NASCET criteria, using the distal internal carotid diameter as the denominator. CONTRAST:  14mL ISOVUE-370 IOPAMIDOL (ISOVUE-370) INJECTION 76% COMPARISON:  None. FINDINGS: CT HEAD FINDINGS Brain: Noncontrast imaging the brain demonstrates scattered periventricular white matter hypoattenuation bilaterally. In age indeterminate lacunar infarct is present within the anterior limb of the left internal capsule. A ridging is white matter hypoattenuation is present in the inferior cerebellum bilaterally. No acute hemorrhage or mass lesion is present. The ventricles are of  normal size. No significant extra-axial fluid collection is present. Vascular: No hyperdense vessel or unexpected calcification. Skull: Calvarium is intact. No focal lytic or blastic lesions are present. Sinuses: The paranasal sinuses and mastoid air cells are clear. Orbits: Bilateral globes and orbits are within normal limits. Review of the MIP images confirms the above findings CTA NECK FINDINGS Aortic arch: A 3 vessel arch configuration is present. Atherosclerotic calcifications are present without significant stenosis at the origins the great vessels. There is no aneurysm. Right carotid system: The right common carotid artery is within normal limits. Mild tortuosity is present in the proximal cervical right ICA. There is focal calcification along the posterior wall of the distal right ICA. No stenosis is present. Left carotid system: The left common carotid artery is within normal limits proximally. There is focal stenosis of the mid left common carotid artery. The lumen is narrowed to 1.6 mm. This compares with a more distal segment of 6.5 mm. Soft tissue plaque is noted along the posterior aspect of the left carotid bulb without significant luminal stenosis. Focal calcification is present along the posterior wall of the distal left internal carotid artery just below the skull base without significant stenosis. Vertebral arteries: The vertebral arteries are codominant. Both vertebral arteries originate from the subclavian arteries without significant stenoses. There is no significant stenosis of either vertebral artery in the neck. Skeleton: Focal endplate changes are present with uncovertebral spurring at C6-7. Bilateral osseous foraminal narrowing is worse on the right. Vertebral body heights and alignment are otherwise normal. The patient is status post median sternotomy. An unerupted right maxillary and I tooth is present. An adjacent hypoplastic on are up to tooth is present. The patient is otherwise  edentulous along the maxilla. Other neck: Soft tissue lipoma is present along the left occipital scalp. The soft tissues of the neck are unremarkable otherwise. Thyroid is within normal limits. Salivary glands are unremarkable. No significant adenopathy is present. Upper chest: Centrilobular emphysematous changes are present. No focal nodule or mass lesion is present. No significant pleural effusion is present. Review of the MIP images confirms the above findings CTA HEAD FINDINGS Anterior circulation: The internal carotid arteries are within normal limits bilaterally from the skull base through the ICA termini. The right A1 segment is dominant. The anterior communicating artery is patent. MCA bifurcation is normal bilaterally. The ACA and MCA branch vessels are within normal limits. Posterior circulation: The vertebral arteries are codominant. Vertebrobasilar junction is normal. PICA origins are visualized and within normal limits. Both posterior cerebral arteries originate from the basilar tip. There is mild irregularity of distal branch vessels without a significant proximal stenosis or occlusion. Venous sinuses: The dural sinuses are patent. Right transverse sinus is dominant. The straight  sinus and deep cerebral veins are intact. Cortical veins are unremarkable. Anatomic variants: None Delayed phase: Post-contrast images demonstrate no pathologic enhancement. Review of the MIP images confirms the above findings IMPRESSION: 1. High-grade stenosis of the mid left common carotid artery measuring greater than 75% relative to the more distal vessels. 2. Additional soft tissue plaque in the posterior wall of the proximal left internal carotid artery without focal stenosis. 3. Focal calcification in the posterior wall of the distal internal carotid artery bilaterally just below the skull base. This could be related to remote trauma. There is no lumen compromise associated. 4. No focal disease at the ophthalmic segment  on the left. 5. Scattered white matter hypoattenuation bilaterally likely reflects the sequela of chronic microvascular ischemia. 6. No acute cortical infarct. These results were called by telephone at the time of interpretation on 11/04/2017 at 3:58 pm to [facility], [place] , who verbally acknowledged these results. Electronically Signed   By: San Morelle M.D.   On: 11/04/2017 16:02   Ct Angio Neck W And/or Wo Contrast  Result Date: 11/04/2017 CLINICAL DATA:  New onset of vision loss in the left eye. The patient has no perception of light in the left eye. He began to have diminished vision in the left eye last evening. He awoke with no vision in the left eye today. EXAM: CT ANGIOGRAPHY HEAD AND NECK TECHNIQUE: Multidetector CT imaging of the head and neck was performed using the standard protocol during bolus administration of intravenous contrast. Multiplanar CT image reconstructions and MIPs were obtained to evaluate the vascular anatomy. Carotid stenosis measurements (when applicable) are obtained utilizing NASCET criteria, using the distal internal carotid diameter as the denominator. CONTRAST:  121mL ISOVUE-370 IOPAMIDOL (ISOVUE-370) INJECTION 76% COMPARISON:  None. FINDINGS: CT HEAD FINDINGS Brain: Noncontrast imaging the brain demonstrates scattered periventricular white matter hypoattenuation bilaterally. In age indeterminate lacunar infarct is present within the anterior limb of the left internal capsule. A ridging is white matter hypoattenuation is present in the inferior cerebellum bilaterally. No acute hemorrhage or mass lesion is present. The ventricles are of normal size. No significant extra-axial fluid collection is present. Vascular: No hyperdense vessel or unexpected calcification. Skull: Calvarium is intact. No focal lytic or blastic lesions are present. Sinuses: The paranasal sinuses and mastoid air cells are clear. Orbits: Bilateral globes and orbits are within normal limits.  Review of the MIP images confirms the above findings CTA NECK FINDINGS Aortic arch: A 3 vessel arch configuration is present. Atherosclerotic calcifications are present without significant stenosis at the origins the great vessels. There is no aneurysm. Right carotid system: The right common carotid artery is within normal limits. Mild tortuosity is present in the proximal cervical right ICA. There is focal calcification along the posterior wall of the distal right ICA. No stenosis is present. Left carotid system: The left common carotid artery is within normal limits proximally. There is focal stenosis of the mid left common carotid artery. The lumen is narrowed to 1.6 mm. This compares with a more distal segment of 6.5 mm. Soft tissue plaque is noted along the posterior aspect of the left carotid bulb without significant luminal stenosis. Focal calcification is present along the posterior wall of the distal left internal carotid artery just below the skull base without significant stenosis. Vertebral arteries: The vertebral arteries are codominant. Both vertebral arteries originate from the subclavian arteries without significant stenoses. There is no significant stenosis of either vertebral artery in the neck. Skeleton: Focal endplate changes  are present with uncovertebral spurring at C6-7. Bilateral osseous foraminal narrowing is worse on the right. Vertebral body heights and alignment are otherwise normal. The patient is status post median sternotomy. An unerupted right maxillary and I tooth is present. An adjacent hypoplastic on are up to tooth is present. The patient is otherwise edentulous along the maxilla. Other neck: Soft tissue lipoma is present along the left occipital scalp. The soft tissues of the neck are unremarkable otherwise. Thyroid is within normal limits. Salivary glands are unremarkable. No significant adenopathy is present. Upper chest: Centrilobular emphysematous changes are present. No  focal nodule or mass lesion is present. No significant pleural effusion is present. Review of the MIP images confirms the above findings CTA HEAD FINDINGS Anterior circulation: The internal carotid arteries are within normal limits bilaterally from the skull base through the ICA termini. The right A1 segment is dominant. The anterior communicating artery is patent. MCA bifurcation is normal bilaterally. The ACA and MCA branch vessels are within normal limits. Posterior circulation: The vertebral arteries are codominant. Vertebrobasilar junction is normal. PICA origins are visualized and within normal limits. Both posterior cerebral arteries originate from the basilar tip. There is mild irregularity of distal branch vessels without a significant proximal stenosis or occlusion. Venous sinuses: The dural sinuses are patent. Right transverse sinus is dominant. The straight sinus and deep cerebral veins are intact. Cortical veins are unremarkable. Anatomic variants: None Delayed phase: Post-contrast images demonstrate no pathologic enhancement. Review of the MIP images confirms the above findings IMPRESSION: 1. High-grade stenosis of the mid left common carotid artery measuring greater than 75% relative to the more distal vessels. 2. Additional soft tissue plaque in the posterior wall of the proximal left internal carotid artery without focal stenosis. 3. Focal calcification in the posterior wall of the distal internal carotid artery bilaterally just below the skull base. This could be related to remote trauma. There is no lumen compromise associated. 4. No focal disease at the ophthalmic segment on the left. 5. Scattered white matter hypoattenuation bilaterally likely reflects the sequela of chronic microvascular ischemia. 6. No acute cortical infarct. These results were called by telephone at the time of interpretation on 11/04/2017 at 3:58 pm to [facility] [place], [place] , who verbally acknowledged these results.  Electronically Signed   By: San Morelle M.D.   On: 11/04/2017 16:02   Mr Jeri Cos IW Contrast  Result Date: 11/05/2017 CLINICAL DATA:  LEFT eye vision loss.  Stroke suspected. EXAM: MRI HEAD AND ORBITS WITHOUT AND WITH CONTRAST TECHNIQUE: Multiplanar, multiecho pulse sequences of the brain and surrounding structures were obtained without and with intravenous contrast. Multiplanar, multiecho pulse sequences of the orbits and surrounding structures were obtained including fat saturation techniques, before and after intravenous contrast administration. CONTRAST:  16mL MULTIHANCE GADOBENATE DIMEGLUMINE 529 MG/ML IV SOLN COMPARISON:  CTA head neck 11/04/2017. FINDINGS: MRI HEAD FINDINGS Brain: Multiple small foci of restricted diffusion are seen in the LEFT hemisphere, affecting the subcortical and periventricular deep white matter largely sparing the cortex, consistent with multiple watershed infarcts. No hemorrhage, mass lesion, hydrocephalus, or extra-axial fluid. Generalized atrophy, premature for age. Moderate T2 and FLAIR hyperintensities in the white matter, consistent with small vessel disease. Remote LEFT periventricular infarct near the caudate. Post infusion, no abnormal enhancement of the brain or meninges. Vascular: Normal flow voids. Skull and upper cervical spine: Normal marrow signal. Other: None. MRI ORBITS FINDINGS Orbits: No traumatic or inflammatory finding. Globes, optic nerves, orbital fat, extraocular muscles, vascular structures,  and lacrimal glands are normal. Visualized sinuses: Clear. Soft tissues: Negative. Limited intracranial: Reported separately. IMPRESSION: MRI brain demonstrating multifocal subcentimeter watershed infarcts of the LEFT hemisphere. High-grade stenosis of the LEFT CCA is likely contributory; multiple emboli are possible, less favored. Atrophy and small vessel disease. Negative MRI of the orbits. Electronically Signed   By: Staci Righter M.D.   On: 11/05/2017  17:24   Mr Rosealee Albee TK Contrast  Result Date: 11/05/2017 CLINICAL DATA:  LEFT eye vision loss.  Stroke suspected. EXAM: MRI HEAD AND ORBITS WITHOUT AND WITH CONTRAST TECHNIQUE: Multiplanar, multiecho pulse sequences of the brain and surrounding structures were obtained without and with intravenous contrast. Multiplanar, multiecho pulse sequences of the orbits and surrounding structures were obtained including fat saturation techniques, before and after intravenous contrast administration. CONTRAST:  13mL MULTIHANCE GADOBENATE DIMEGLUMINE 529 MG/ML IV SOLN COMPARISON:  CTA head neck 11/04/2017. FINDINGS: MRI HEAD FINDINGS Brain: Multiple small foci of restricted diffusion are seen in the LEFT hemisphere, affecting the subcortical and periventricular deep white matter largely sparing the cortex, consistent with multiple watershed infarcts. No hemorrhage, mass lesion, hydrocephalus, or extra-axial fluid. Generalized atrophy, premature for age. Moderate T2 and FLAIR hyperintensities in the white matter, consistent with small vessel disease. Remote LEFT periventricular infarct near the caudate. Post infusion, no abnormal enhancement of the brain or meninges. Vascular: Normal flow voids. Skull and upper cervical spine: Normal marrow signal. Other: None. MRI ORBITS FINDINGS Orbits: No traumatic or inflammatory finding. Globes, optic nerves, orbital fat, extraocular muscles, vascular structures, and lacrimal glands are normal. Visualized sinuses: Clear. Soft tissues: Negative. Limited intracranial: Reported separately. IMPRESSION: MRI brain demonstrating multifocal subcentimeter watershed infarcts of the LEFT hemisphere. High-grade stenosis of the LEFT CCA is likely contributory; multiple emboli are possible, less favored. Atrophy and small vessel disease. Negative MRI of the orbits. Electronically Signed   By: Staci Righter M.D.   On: 11/05/2017 17:24   I reviewed the patient's CTA, pt has hemodynamically significant  stenosis of L CCA in mid-neck.  I don't see much disease in the LICA.  This CCA stenosis should be surgical accessible.   Medical Decision Making   [name] is a 62 y.o. 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  Pt's history is suspicious for GI bleed despite recent findings on EGD.   H/H is stable and EGD doesn't demonstrate active bleeding.  Based on the patient's vascular studies and examination, I have offered the patient: L CCA endarterectomy. I discussed with the patient the risks, benefits, and alternatives to carotid endarterectomy.   I discussed the procedural details of carotid endarterectomy with the patient.   The patient is aware that the risks of carotid endarterectomy include but are not limited to: bleeding, infection, stroke, myocardial infarction, death, cranial nerve injuries both temporary and permanent, neck hematoma, possible airway compromise, labile blood pressure post-operatively, cerebral hyperperfusion syndrome, and possible need for additional interventions in the future.  The patient is aware of the risks and agrees to proceed forward with the procedure.  I will schedule the patient for tomorrow.  Given the lack of sx and potential for low flow state with the lesion, I would not delay this intervention . I discussed in depth with  the patient the nature of atherosclerosis, and emphasized the importance of maximal medical management including strict control of blood pressure, blood glucose, and lipid levels, obtaining regular exercise, antiplatelet agents, and cessation of smoking.    The patient is currently on a statin: Lipitor.   The patient is currently not on anti-platelet due to risk of bleeding.  Obvious  surgery without anti-platelet is also at higher risk of carotid thrombosis and subsequent stroke.  Additionally, heparinization is necessary during the initial period of clamping the common carotid artery.  Heparin will be reversed shortly after endarterectomy completed.  The patient is aware that without maximal medical management the underlying atherosclerotic disease process will progress, limiting the benefit of any interventions.  Thank you for allowing [place] to participate in this patient's care.   Adele Barthel, MD, FACS [facility] Office: [phone] Pager: [phone]  11/06/2017, 12:16 AM

## 2017-11-06 NOTE — Progress Notes (Signed)
PCR recent screen negative per chart review

## 2017-11-06 NOTE — Interval H&P Note (Signed)
   History and Physical Update  The patient was interviewed and re-examined.  The patient's previous History and Physical has been reviewed and is unchanged from my consult.  Given severity of prior anemia and known duodenal ulcer history, there is a reasonable chance of recurrent GI bleeding.  I have concerns to a recurrent GI bleeding was result in additional watershed infarcts in the Left brain due to the Left common carotid artery stenosis >70%.  There is no change in the plan of care: left common carotid artery endarterectomy.   I discussed with the patient the risks, benefits, and alternatives to carotid endarterectomy.    We discussed carotid artery stenting but lesion is easily accessible, so I doubt any advantage to stenting over endarterectomy in this patient.  . I discussed the procedural details of carotid endarterectomy with the patient.    The patient is aware that the risks of carotid endarterectomy include but are not limited to: bleeding, infection, stroke, myocardial infarction, death, cranial nerve injuries both temporary and permanent, neck hematoma, possible airway compromise, labile blood pressure post-operatively, cerebral hyperperfusion syndrome, and possible need for additional interventions in the future.   The patient is aware of the risks and agrees to proceed forward with the procedure. Adele Barthel, MD, FACS [facility] Office: [phone] Pager: [phone]  11/06/2017, 10:08 AM

## 2017-11-06 NOTE — Anesthesia Procedure Notes (Signed)
Procedure Name: Intubation Date/Time: 11/06/2017 9:57 AM Performed by: Maness, Carrie B, CRNA Pre-anesthesia Checklist: Patient identified, Emergency Drugs available, Suction available and Patient being monitored Patient Re-evaluated:Patient Re-evaluated prior to induction Oxygen Delivery Method: Circle System Utilized Preoxygenation: Pre-oxygenation with 100% oxygen Induction Type: IV induction Ventilation: Mask ventilation without difficulty Laryngoscope Size: Mac and 3 Grade View: Grade I Tube type: Oral Tube size: 7.5 mm Number of attempts: 1 Airway Equipment and Method: Stylet,  Oral airway and LTA kit utilized Placement Confirmation: ETT inserted through vocal cords under direct vision,  positive ETCO2 and breath sounds checked- equal and bilateral Tube secured with: Tape Dental Injury: Teeth and Oropharynx as per pre-operative assessment        

## 2017-11-06 NOTE — H&P (View-Only) (Signed)
New Carotid Patient  Requested by:  Dr. Alfredia Ferguson  Reason for consultation: left carotid stenosis   History of Present Illness   [name] is a 61 y.o. (04/17/1956) male who presents with chief complaint: loss of vision on left eye.   Reportedly patient has had a gradual loss of left eye vision over 2-3 days.  At this point, he has no vision in that eye.  Patient has no history of TIA or stroke symptom.  The patient has never had amaurosis fugax.  The patient has never had facial drooping or hemiplegia.  The patient has never had receptive or expressive aphasia.   The patient's previous neurologic deficits have NOT resolved.  The patient's risks factors for carotid disease include: HLD, HTN and active smoking.  Additionally, pt has a h/o GI bleeding and UDS on admission was + for cocaine and THC.  Pt notes also 1 week prior history of coffee ground emesis and darker stools.   Past Medical History:  Diagnosis Date  . Anginal pain ([place])   . CAD (coronary artery disease)    4V CABG 10/31/13  . Cocaine abuse ([place])   . COPD (chronic obstructive pulmonary disease) ([place])   . GERD (gastroesophageal reflux disease)   . H/O: eczema    lower extremities  . H/O: GI bleed    a. 62 y/o ago, r/t ulcer. Denies recent GIB.  Marland Kitchen High cholesterol   . Hypertension   . Myocardial infarction ([place])   . Perforation of duodenal ulcer ([place])    a. 1970s - s/p surgery.   . Shortness of breath dyspnea   . Tobacco abuse     Past Surgical History:  Procedure Laterality Date  . BIOPSY  11/05/2017   Procedure: BIOPSY;  Surgeon: Wilford Corner, MD;  Location: [place] [facility];  Service: Endoscopy;;  . CORONARY ARTERY BYPASS GRAFT N/A 10/31/2013   Procedure: CORONARY ARTERY BYPASS GRAFTING TIMES FOUR ON PUMP USING LEFT INTERNAL MAMMARY ARTERY AND RIGHT GREATER SAPHENOUS VEIN VIA ENDOVEIN HARVEST.;  Surgeon: Melrose Nakayama, MD;  Location: [facility];  Service: Open Heart Surgery;  Laterality: N/A;  .  ESOPHAGOGASTRODUODENOSCOPY (EGD) WITH PROPOFOL N/A 11/05/2017   Procedure: ESOPHAGOGASTRODUODENOSCOPY (EGD) WITH PROPOFOL;  Surgeon: Wilford Corner, MD;  Location: [facility];  Service: Endoscopy;  Laterality: N/A;  . INTRAOPERATIVE TRANSESOPHAGEAL ECHOCARDIOGRAM N/A 10/31/2013   Procedure: INTRAOPERATIVE TRANSESOPHAGEAL ECHOCARDIOGRAM;  Surgeon: Melrose Nakayama, MD;  Location: [facility];  Service: Open Heart Surgery;  Laterality: N/A;  . LEFT HEART CATHETERIZATION WITH CORONARY ANGIOGRAM N/A 10/28/2013   Procedure: LEFT HEART CATHETERIZATION WITH CORONARY ANGIOGRAM;  Surgeon: Burnell Blanks, MD;  Location: [facility] CATH LAB;  Service: Cardiovascular;  Laterality: N/A;  . stomach sx      Social History   Socioeconomic History  . Marital status: Single    Spouse name: Not on file  . Number of children: Not on file  . Years of education: Not on file  . Highest education level: Not on file  Occupational History  . Not on file  Social Needs  . Financial resource strain: Not on file  . Food insecurity:    Worry: Not on file    Inability: Not on file  . Transportation needs:    Medical: Not on file    Non-medical: Not on file  Tobacco Use  . Smoking status: Current Every Day Smoker    Packs/day: 0.50    Years: 40.00    Pack years: 20.00  Types: Cigarettes  . Smokeless tobacco: Never Used  Substance and Sexual Activity  . Alcohol use: Yes    Alcohol/week: 1.2 oz    Types: 2 Cans of beer per week    Comment: 6 pack over the weekend   . Drug use: Yes    Types: Marijuana, Cocaine    Comment: "about 1 marijuana cigarette a week"  . Sexual activity: Yes    Birth control/protection: None  Lifestyle  . Physical activity:    Days per week: Not on file    Minutes per session: Not on file  . Stress: Not on file  Relationships  . Social connections:    Talks on phone: Not on file    Gets together: Not on file    Attends religious service: Not on file    Active member of  club or organization: Not on file    Attends meetings of clubs or organizations: Not on file    Relationship status: Not on file  . Intimate partner violence:    Fear of current or ex partner: Not on file    Emotionally abused: Not on file    Physically abused: Not on file    Forced sexual activity: Not on file  Other Topics Concern  . Not on file  Social History Narrative  . Not on file    Family History  Problem Relation Age of Onset  . Lung cancer Unknown     Current Facility-Administered Medications  Medication Dose Route Frequency Provider Last Rate Last Dose  . 0.9 % NaCl with KCl 40 mEq / L  infusion   Intravenous Continuous Wilford Corner, MD 75 mL/hr at 11/05/17 2332 75 mL/hr at 11/05/17 2332  . acetaminophen (TYLENOL) tablet 650 mg  650 mg Oral Q6H PRN Wilford Corner, MD       Or  . acetaminophen (TYLENOL) suppository 650 mg  650 mg Rectal Q6H PRN Wilford Corner, MD      . albuterol (PROVENTIL) (2.5 MG/3ML) 0.083% nebulizer solution 2.5 mg  2.5 mg Nebulization Q6H PRN Wilford Corner, MD   2.5 mg at 11/05/17 2159  . atorvastatin (LIPITOR) tablet 80 mg  80 mg Oral q1800 Wilford Corner, MD   80 mg at 11/05/17 1722  . cyanocobalamin ((VITAMIN B-12)) injection 1,000 mcg  1,000 mcg Intramuscular Daily [place], Silver Huguenin, MD   1,000 mcg at 11/05/17 1722  . fluticasone (FLONASE) 50 MCG/ACT nasal spray 2 spray  2 spray Each Nare Daily PRN Wilford Corner, MD      . fluticasone furoate-vilanterol (BREO ELLIPTA) 200-25 MCG/INH 1 puff  1 puff Inhalation Daily Wilford Corner, MD   1 puff at 11/05/17 0907  . folic acid (FOLVITE) tablet 1 mg  1 mg Oral Daily Wilford Corner, MD   1 mg at 11/05/17 0953  . LORazepam (ATIVAN) injection 2-3 mg  2-3 mg Intravenous Q1H PRN Wilford Corner, MD      . multivitamin with minerals tablet 1 tablet  1 tablet Oral Daily Wilford Corner, MD   1 tablet at 11/05/17 0953  . nicotine (NICODERM CQ - dosed in mg/24 hours) patch 14  mg  14 mg Transdermal Daily Wilford Corner, MD   14 mg at 11/05/17 0954  . ondansetron (ZOFRAN) tablet 4 mg  4 mg Oral Q6H PRN Wilford Corner, MD       Or  . ondansetron [facility]) injection 4 mg  4 mg Intravenous Q6H PRN Wilford Corner, MD      . pantoprazole (  PROTONIX) 80 mg in sodium chloride 0.9 % 250 mL (0.32 mg/mL) infusion  8 mg/hr Intravenous Continuous Wilford Corner, MD 25 mL/hr at 11/05/17 1008 8 mg/hr at 11/05/17 1008  . [START ON 11/08/2017] pantoprazole (PROTONIX) injection 40 mg  40 mg Intravenous Q12H Wilford Corner, MD      . polyethylene glycol (MIRALAX / GLYCOLAX) packet 17 g  17 g Oral Daily PRN Wilford Corner, MD      . thiamine (VITAMIN B-1) tablet 100 mg  100 mg Oral Daily Wilford Corner, MD   100 mg at 11/05/17 0954  . traMADol (ULTRAM) tablet 50 mg  50 mg Oral Q6H PRN Wilford Corner, MD        No Known Allergies  REVIEW OF SYSTEMS (negative unless checked):   Cardiac:  []  Chest pain or chest pressure? []  Shortness of breath upon activity? []  Shortness of breath when lying flat? []  Irregular heart rhythm?  Vascular:  []  Pain in calf, thigh, or hip brought on by walking? []  Pain in feet at night that wakes you up from your sleep? []  Blood clot in your veins? []  Leg swelling?  Pulmonary:  []  Oxygen at home? []  Productive cough? []  Wheezing?  Neurologic:  []  Sudden weakness in arms or legs? []  Sudden numbness in arms or legs? []  Sudden onset of difficult speaking or slurred speech? [x]  Loss of vision in one eye? []  Problems with dizziness?  Gastrointestinal:  []  Blood in stool? []  Vomited blood?  Genitourinary:  []  Burning when urinating? []  Blood in urine?  Psychiatric:  []  Major depression  Hematologic:  []  Bleeding problems? []  Problems with blood clotting?  Dermatologic:  []  Rashes or ulcers?  Constitutional:  []  Fever or chills?  Ear/Nose/Throat:  []  Change in hearing? []  Nose bleeds? []  Sore  throat?  Musculoskeletal:  []  Back pain? []  Joint pain? []  Muscle pain?   For VQI Use Only   PRE-ADM LIVING Home  AMB STATUS Ambulatory  CAD Sx History of MI, but no symptoms No MI within 6 months  PRIOR CHF None  STRESS TEST No    Physical Examination     Vitals:   11/05/17 1810 11/05/17 2046 11/05/17 2200 11/05/17 2212  BP: 118/65 117/67  108/63  Pulse: 70 71    Resp: 14 (!) 23  16  Temp: 97.6 F (36.4 C) 98.7 F (37.1 C)  98.8 F (37.1 C)  TempSrc: Oral Oral  Oral  SpO2: 100% 100% 98% 100%  Weight:      Height:       Body mass index is 24.64 kg/m.  General Alert, O x 3, WD, NAD  [place]/AT,    Ear/Nose/ Throat Hearing grossly intact, nares without erythema or drainage, oropharynx without Erythema or Exudate, Mallampati score: 3,   Eyes No pupillary reaction in L, dilated R pupils, EOMI,    Neck Supple, mid-line trachea,    Pulmonary Sym exp, good B air movt, CTA B  Cardiac RRR, Nl S1, S2, no Murmurs, No rubs, No S3,S4  Vascular Vessel Right Left  Radial Palpable Palpable  Brachial Palpable Palpable  Carotid Palpable, No Bruit Palpable, No Bruit  Aorta Not palpable N/A  Femoral Palpable Palpable  Popliteal Not palpable Not palpable  PT Not palpable Not palpable  DP Faintly palpable Faintly palpable    Gastro- intestinal soft, non-distended, non-tender to palpation, No guarding or rebound, no HSM, no masses, no CVAT B, No palpable prominent aortic pulse,    Musculo- skeletal M/S  5/5 throughout  , Extremities without ischemic changes  , No edema present, No visible varicosities , No Lipodermatosclerosis present  Neurologic Cranial nerves 2-12 intact , Pain and light touch intact in extremities , Motor exam as listed above  Psychiatric Judgement intact, Mood & affect appropriate for pt's clinical situation  Dermatologic See M/S exam for extremity exam, No rashes otherwise noted  Lymphatic  Palpable lymph nodes: None    Laboratory   CBC CBC Latest Ref  Rng & Units 11/05/2017 11/05/2017 11/04/2017  WBC 4.0 - 10.5 K/uL - 7.1 -  Hemoglobin 13.0 - 17.0 g/dL 9.6(L) 7.2(L) 4.3(LL)  Hematocrit 39.0 - 52.0 % 30.1(L) 23.0(L) 14.4(L)  Platelets 150 - 400 K/uL - 305 -    BMP BMP Latest Ref Rng & Units 11/05/2017 11/04/2017 06/28/2017  Glucose 65 - 99 mg/dL 86 97 83  BUN 6 - 20 mg/dL 7 11 6(L)  Creatinine 0.61 - 1.24 mg/dL 0.95 1.05 1.13  BUN/Creat Ratio 10 - 24 - - 5(L)  Sodium 135 - 145 mmol/L 135 136 141  Potassium 3.5 - 5.1 mmol/L 3.5 2.9(L) 4.1  Chloride 101 - 111 mmol/L 104 101 101  CO2 22 - 32 mmol/L 22 24 24   Calcium 8.9 - 10.3 mg/dL 8.4(L) 8.5(L) 9.1    Coagulation Lab Results  Component Value Date   INR 1.01 11/05/2017   INR 0.93 11/04/2017   INR 1.07 07/02/2015   No results found for: PTT  Lipids    Component Value Date/Time   CHOL 97 11/05/2017 0253   CHOL 156 06/28/2017 1216   TRIG 57 11/05/2017 0253   HDL 44 11/05/2017 0253   HDL 72 06/28/2017 1216   CHOLHDL 2.2 11/05/2017 0253   VLDL 11 11/05/2017 0253   LDLCALC 42 11/05/2017 0253   LDLCALC 65 06/28/2017 1216   Radiology     Ct Angio Head W Or Wo Contrast  Result Date: 11/04/2017 CLINICAL DATA:  New onset of vision loss in the left eye. The patient has no perception of light in the left eye. He began to have diminished vision in the left eye last evening. He awoke with no vision in the left eye today. EXAM: CT ANGIOGRAPHY HEAD AND NECK TECHNIQUE: Multidetector CT imaging of the head and neck was performed using the standard protocol during bolus administration of intravenous contrast. Multiplanar CT image reconstructions and MIPs were obtained to evaluate the vascular anatomy. Carotid stenosis measurements (when applicable) are obtained utilizing NASCET criteria, using the distal internal carotid diameter as the denominator. CONTRAST:  120mL ISOVUE-370 IOPAMIDOL (ISOVUE-370) INJECTION 76% COMPARISON:  None. FINDINGS: CT HEAD FINDINGS Brain: Noncontrast imaging the brain  demonstrates scattered periventricular white matter hypoattenuation bilaterally. In age indeterminate lacunar infarct is present within the anterior limb of the left internal capsule. A ridging is white matter hypoattenuation is present in the inferior cerebellum bilaterally. No acute hemorrhage or mass lesion is present. The ventricles are of normal size. No significant extra-axial fluid collection is present. Vascular: No hyperdense vessel or unexpected calcification. Skull: Calvarium is intact. No focal lytic or blastic lesions are present. Sinuses: The paranasal sinuses and mastoid air cells are clear. Orbits: Bilateral globes and orbits are within normal limits. Review of the MIP images confirms the above findings CTA NECK FINDINGS Aortic arch: A 3 vessel arch configuration is present. Atherosclerotic calcifications are present without significant stenosis at the origins the great vessels. There is no aneurysm. Right carotid system: The right common carotid artery is within normal limits.  Mild tortuosity is present in the proximal cervical right ICA. There is focal calcification along the posterior wall of the distal right ICA. No stenosis is present. Left carotid system: The left common carotid artery is within normal limits proximally. There is focal stenosis of the mid left common carotid artery. The lumen is narrowed to 1.6 mm. This compares with a more distal segment of 6.5 mm. Soft tissue plaque is noted along the posterior aspect of the left carotid bulb without significant luminal stenosis. Focal calcification is present along the posterior wall of the distal left internal carotid artery just below the skull base without significant stenosis. Vertebral arteries: The vertebral arteries are codominant. Both vertebral arteries originate from the subclavian arteries without significant stenoses. There is no significant stenosis of either vertebral artery in the neck. Skeleton: Focal endplate changes are  present with uncovertebral spurring at C6-7. Bilateral osseous foraminal narrowing is worse on the right. Vertebral body heights and alignment are otherwise normal. The patient is status post median sternotomy. An unerupted right maxillary and I tooth is present. An adjacent hypoplastic on are up to tooth is present. The patient is otherwise edentulous along the maxilla. Other neck: Soft tissue lipoma is present along the left occipital scalp. The soft tissues of the neck are unremarkable otherwise. Thyroid is within normal limits. Salivary glands are unremarkable. No significant adenopathy is present. Upper chest: Centrilobular emphysematous changes are present. No focal nodule or mass lesion is present. No significant pleural effusion is present. Review of the MIP images confirms the above findings CTA HEAD FINDINGS Anterior circulation: The internal carotid arteries are within normal limits bilaterally from the skull base through the ICA termini. The right A1 segment is dominant. The anterior communicating artery is patent. MCA bifurcation is normal bilaterally. The ACA and MCA branch vessels are within normal limits. Posterior circulation: The vertebral arteries are codominant. Vertebrobasilar junction is normal. PICA origins are visualized and within normal limits. Both posterior cerebral arteries originate from the basilar tip. There is mild irregularity of distal branch vessels without a significant proximal stenosis or occlusion. Venous sinuses: The dural sinuses are patent. Right transverse sinus is dominant. The straight sinus and deep cerebral veins are intact. Cortical veins are unremarkable. Anatomic variants: None Delayed phase: Post-contrast images demonstrate no pathologic enhancement. Review of the MIP images confirms the above findings IMPRESSION: 1. High-grade stenosis of the mid left common carotid artery measuring greater than 75% relative to the more distal vessels. 2. Additional soft tissue  plaque in the posterior wall of the proximal left internal carotid artery without focal stenosis. 3. Focal calcification in the posterior wall of the distal internal carotid artery bilaterally just below the skull base. This could be related to remote trauma. There is no lumen compromise associated. 4. No focal disease at the ophthalmic segment on the left. 5. Scattered white matter hypoattenuation bilaterally likely reflects the sequela of chronic microvascular ischemia. 6. No acute cortical infarct. These results were called by telephone at the time of interpretation on 11/04/2017 at 3:58 pm to [facility] [place] , who verbally acknowledged these results. Electronically Signed   By: San Morelle M.D.   On: 11/04/2017 16:02   Ct Head Wo Contrast  Result Date: 11/04/2017 CLINICAL DATA:  New onset of vision loss in the left eye. The patient has no perception of light in the left eye. He began to have diminished vision in the left eye last evening. He awoke with no vision in the left  eye today. EXAM: CT ANGIOGRAPHY HEAD AND NECK TECHNIQUE: Multidetector CT imaging of the head and neck was performed using the standard protocol during bolus administration of intravenous contrast. Multiplanar CT image reconstructions and MIPs were obtained to evaluate the vascular anatomy. Carotid stenosis measurements (when applicable) are obtained utilizing NASCET criteria, using the distal internal carotid diameter as the denominator. CONTRAST:  140mL ISOVUE-370 IOPAMIDOL (ISOVUE-370) INJECTION 76% COMPARISON:  None. FINDINGS: CT HEAD FINDINGS Brain: Noncontrast imaging the brain demonstrates scattered periventricular white matter hypoattenuation bilaterally. In age indeterminate lacunar infarct is present within the anterior limb of the left internal capsule. A ridging is white matter hypoattenuation is present in the inferior cerebellum bilaterally. No acute hemorrhage or mass lesion is present. The ventricles are of  normal size. No significant extra-axial fluid collection is present. Vascular: No hyperdense vessel or unexpected calcification. Skull: Calvarium is intact. No focal lytic or blastic lesions are present. Sinuses: The paranasal sinuses and mastoid air cells are clear. Orbits: Bilateral globes and orbits are within normal limits. Review of the MIP images confirms the above findings CTA NECK FINDINGS Aortic arch: A 3 vessel arch configuration is present. Atherosclerotic calcifications are present without significant stenosis at the origins the great vessels. There is no aneurysm. Right carotid system: The right common carotid artery is within normal limits. Mild tortuosity is present in the proximal cervical right ICA. There is focal calcification along the posterior wall of the distal right ICA. No stenosis is present. Left carotid system: The left common carotid artery is within normal limits proximally. There is focal stenosis of the mid left common carotid artery. The lumen is narrowed to 1.6 mm. This compares with a more distal segment of 6.5 mm. Soft tissue plaque is noted along the posterior aspect of the left carotid bulb without significant luminal stenosis. Focal calcification is present along the posterior wall of the distal left internal carotid artery just below the skull base without significant stenosis. Vertebral arteries: The vertebral arteries are codominant. Both vertebral arteries originate from the subclavian arteries without significant stenoses. There is no significant stenosis of either vertebral artery in the neck. Skeleton: Focal endplate changes are present with uncovertebral spurring at C6-7. Bilateral osseous foraminal narrowing is worse on the right. Vertebral body heights and alignment are otherwise normal. The patient is status post median sternotomy. An unerupted right maxillary and I tooth is present. An adjacent hypoplastic on are up to tooth is present. The patient is otherwise  edentulous along the maxilla. Other neck: Soft tissue lipoma is present along the left occipital scalp. The soft tissues of the neck are unremarkable otherwise. Thyroid is within normal limits. Salivary glands are unremarkable. No significant adenopathy is present. Upper chest: Centrilobular emphysematous changes are present. No focal nodule or mass lesion is present. No significant pleural effusion is present. Review of the MIP images confirms the above findings CTA HEAD FINDINGS Anterior circulation: The internal carotid arteries are within normal limits bilaterally from the skull base through the ICA termini. The right A1 segment is dominant. The anterior communicating artery is patent. MCA bifurcation is normal bilaterally. The ACA and MCA branch vessels are within normal limits. Posterior circulation: The vertebral arteries are codominant. Vertebrobasilar junction is normal. PICA origins are visualized and within normal limits. Both posterior cerebral arteries originate from the basilar tip. There is mild irregularity of distal branch vessels without a significant proximal stenosis or occlusion. Venous sinuses: The dural sinuses are patent. Right transverse sinus is dominant. The straight  sinus and deep cerebral veins are intact. Cortical veins are unremarkable. Anatomic variants: None Delayed phase: Post-contrast images demonstrate no pathologic enhancement. Review of the MIP images confirms the above findings IMPRESSION: 1. High-grade stenosis of the mid left common carotid artery measuring greater than 75% relative to the more distal vessels. 2. Additional soft tissue plaque in the posterior wall of the proximal left internal carotid artery without focal stenosis. 3. Focal calcification in the posterior wall of the distal internal carotid artery bilaterally just below the skull base. This could be related to remote trauma. There is no lumen compromise associated. 4. No focal disease at the ophthalmic segment  on the left. 5. Scattered white matter hypoattenuation bilaterally likely reflects the sequela of chronic microvascular ischemia. 6. No acute cortical infarct. These results were called by telephone at the time of interpretation on 11/04/2017 at 3:58 pm to [facility] [place] , who verbally acknowledged these results. Electronically Signed   By: San Morelle M.D.   On: 11/04/2017 16:02   Ct Angio Neck W And/or Wo Contrast  Result Date: 11/04/2017 CLINICAL DATA:  New onset of vision loss in the left eye. The patient has no perception of light in the left eye. He began to have diminished vision in the left eye last evening. He awoke with no vision in the left eye today. EXAM: CT ANGIOGRAPHY HEAD AND NECK TECHNIQUE: Multidetector CT imaging of the head and neck was performed using the standard protocol during bolus administration of intravenous contrast. Multiplanar CT image reconstructions and MIPs were obtained to evaluate the vascular anatomy. Carotid stenosis measurements (when applicable) are obtained utilizing NASCET criteria, using the distal internal carotid diameter as the denominator. CONTRAST:  137mL ISOVUE-370 IOPAMIDOL (ISOVUE-370) INJECTION 76% COMPARISON:  None. FINDINGS: CT HEAD FINDINGS Brain: Noncontrast imaging the brain demonstrates scattered periventricular white matter hypoattenuation bilaterally. In age indeterminate lacunar infarct is present within the anterior limb of the left internal capsule. A ridging is white matter hypoattenuation is present in the inferior cerebellum bilaterally. No acute hemorrhage or mass lesion is present. The ventricles are of normal size. No significant extra-axial fluid collection is present. Vascular: No hyperdense vessel or unexpected calcification. Skull: Calvarium is intact. No focal lytic or blastic lesions are present. Sinuses: The paranasal sinuses and mastoid air cells are clear. Orbits: Bilateral globes and orbits are within normal limits.  Review of the MIP images confirms the above findings CTA NECK FINDINGS Aortic arch: A 3 vessel arch configuration is present. Atherosclerotic calcifications are present without significant stenosis at the origins the great vessels. There is no aneurysm. Right carotid system: The right common carotid artery is within normal limits. Mild tortuosity is present in the proximal cervical right ICA. There is focal calcification along the posterior wall of the distal right ICA. No stenosis is present. Left carotid system: The left common carotid artery is within normal limits proximally. There is focal stenosis of the mid left common carotid artery. The lumen is narrowed to 1.6 mm. This compares with a more distal segment of 6.5 mm. Soft tissue plaque is noted along the posterior aspect of the left carotid bulb without significant luminal stenosis. Focal calcification is present along the posterior wall of the distal left internal carotid artery just below the skull base without significant stenosis. Vertebral arteries: The vertebral arteries are codominant. Both vertebral arteries originate from the subclavian arteries without significant stenoses. There is no significant stenosis of either vertebral artery in the neck. Skeleton: Focal endplate changes  are present with uncovertebral spurring at C6-7. Bilateral osseous foraminal narrowing is worse on the right. Vertebral body heights and alignment are otherwise normal. The patient is status post median sternotomy. An unerupted right maxillary and I tooth is present. An adjacent hypoplastic on are up to tooth is present. The patient is otherwise edentulous along the maxilla. Other neck: Soft tissue lipoma is present along the left occipital scalp. The soft tissues of the neck are unremarkable otherwise. Thyroid is within normal limits. Salivary glands are unremarkable. No significant adenopathy is present. Upper chest: Centrilobular emphysematous changes are present. No  focal nodule or mass lesion is present. No significant pleural effusion is present. Review of the MIP images confirms the above findings CTA HEAD FINDINGS Anterior circulation: The internal carotid arteries are within normal limits bilaterally from the skull base through the ICA termini. The right A1 segment is dominant. The anterior communicating artery is patent. MCA bifurcation is normal bilaterally. The ACA and MCA branch vessels are within normal limits. Posterior circulation: The vertebral arteries are codominant. Vertebrobasilar junction is normal. PICA origins are visualized and within normal limits. Both posterior cerebral arteries originate from the basilar tip. There is mild irregularity of distal branch vessels without a significant proximal stenosis or occlusion. Venous sinuses: The dural sinuses are patent. Right transverse sinus is dominant. The straight sinus and deep cerebral veins are intact. Cortical veins are unremarkable. Anatomic variants: None Delayed phase: Post-contrast images demonstrate no pathologic enhancement. Review of the MIP images confirms the above findings IMPRESSION: 1. High-grade stenosis of the mid left common carotid artery measuring greater than 75% relative to the more distal vessels. 2. Additional soft tissue plaque in the posterior wall of the proximal left internal carotid artery without focal stenosis. 3. Focal calcification in the posterior wall of the distal internal carotid artery bilaterally just below the skull base. This could be related to remote trauma. There is no lumen compromise associated. 4. No focal disease at the ophthalmic segment on the left. 5. Scattered white matter hypoattenuation bilaterally likely reflects the sequela of chronic microvascular ischemia. 6. No acute cortical infarct. These results were called by telephone at the time of interpretation on 11/04/2017 at 3:58 pm to [facility] [place], [place] , who verbally acknowledged these results.  Electronically Signed   By: San Morelle M.D.   On: 11/04/2017 16:02   Mr Jeri Cos FA Contrast  Result Date: 11/05/2017 CLINICAL DATA:  LEFT eye vision loss.  Stroke suspected. EXAM: MRI HEAD AND ORBITS WITHOUT AND WITH CONTRAST TECHNIQUE: Multiplanar, multiecho pulse sequences of the brain and surrounding structures were obtained without and with intravenous contrast. Multiplanar, multiecho pulse sequences of the orbits and surrounding structures were obtained including fat saturation techniques, before and after intravenous contrast administration. CONTRAST:  30mL MULTIHANCE GADOBENATE DIMEGLUMINE 529 MG/ML IV SOLN COMPARISON:  CTA head neck 11/04/2017. FINDINGS: MRI HEAD FINDINGS Brain: Multiple small foci of restricted diffusion are seen in the LEFT hemisphere, affecting the subcortical and periventricular deep white matter largely sparing the cortex, consistent with multiple watershed infarcts. No hemorrhage, mass lesion, hydrocephalus, or extra-axial fluid. Generalized atrophy, premature for age. Moderate T2 and FLAIR hyperintensities in the white matter, consistent with small vessel disease. Remote LEFT periventricular infarct near the caudate. Post infusion, no abnormal enhancement of the brain or meninges. Vascular: Normal flow voids. Skull and upper cervical spine: Normal marrow signal. Other: None. MRI ORBITS FINDINGS Orbits: No traumatic or inflammatory finding. Globes, optic nerves, orbital fat, extraocular muscles, vascular structures,  and lacrimal glands are normal. Visualized sinuses: Clear. Soft tissues: Negative. Limited intracranial: Reported separately. IMPRESSION: MRI brain demonstrating multifocal subcentimeter watershed infarcts of the LEFT hemisphere. High-grade stenosis of the LEFT CCA is likely contributory; multiple emboli are possible, less favored. Atrophy and small vessel disease. Negative MRI of the orbits. Electronically Signed   By: Staci Righter M.D.   On: 11/05/2017  17:24   Mr Rosealee Albee NF Contrast  Result Date: 11/05/2017 CLINICAL DATA:  LEFT eye vision loss.  Stroke suspected. EXAM: MRI HEAD AND ORBITS WITHOUT AND WITH CONTRAST TECHNIQUE: Multiplanar, multiecho pulse sequences of the brain and surrounding structures were obtained without and with intravenous contrast. Multiplanar, multiecho pulse sequences of the orbits and surrounding structures were obtained including fat saturation techniques, before and after intravenous contrast administration. CONTRAST:  25mL MULTIHANCE GADOBENATE DIMEGLUMINE 529 MG/ML IV SOLN COMPARISON:  CTA head neck 11/04/2017. FINDINGS: MRI HEAD FINDINGS Brain: Multiple small foci of restricted diffusion are seen in the LEFT hemisphere, affecting the subcortical and periventricular deep white matter largely sparing the cortex, consistent with multiple watershed infarcts. No hemorrhage, mass lesion, hydrocephalus, or extra-axial fluid. Generalized atrophy, premature for age. Moderate T2 and FLAIR hyperintensities in the white matter, consistent with small vessel disease. Remote LEFT periventricular infarct near the caudate. Post infusion, no abnormal enhancement of the brain or meninges. Vascular: Normal flow voids. Skull and upper cervical spine: Normal marrow signal. Other: None. MRI ORBITS FINDINGS Orbits: No traumatic or inflammatory finding. Globes, optic nerves, orbital fat, extraocular muscles, vascular structures, and lacrimal glands are normal. Visualized sinuses: Clear. Soft tissues: Negative. Limited intracranial: Reported separately. IMPRESSION: MRI brain demonstrating multifocal subcentimeter watershed infarcts of the LEFT hemisphere. High-grade stenosis of the LEFT CCA is likely contributory; multiple emboli are possible, less favored. Atrophy and small vessel disease. Negative MRI of the orbits. Electronically Signed   By: Staci Righter M.D.   On: 11/05/2017 17:24   I reviewed the patient's CTA, pt has hemodynamically significant  stenosis of L CCA in mid-neck.  I don't see much disease in the LICA.  This CCA stenosis should be surgical accessible.   Medical Decision Making   [name] is a 62 y.o. 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  Pt's history is suspicious for GI bleed despite recent findings on EGD.   H/H is stable and EGD doesn't demonstrate active bleeding.  Based on the patient's vascular studies and examination, I have offered the patient: L CCA endarterectomy. I discussed with the patient the risks, benefits, and alternatives to carotid endarterectomy.   I discussed the procedural details of carotid endarterectomy with the patient.   The patient is aware that the risks of carotid endarterectomy include but are not limited to: bleeding, infection, stroke, myocardial infarction, death, cranial nerve injuries both temporary and permanent, neck hematoma, possible airway compromise, labile blood pressure post-operatively, cerebral hyperperfusion syndrome, and possible need for additional interventions in the future.  The patient is aware of the risks and agrees to proceed forward with the procedure.  I will schedule the patient for tomorrow.  Given the lack of sx and potential for low flow state with the lesion, I would not delay this intervention . I discussed in depth with  the patient the nature of atherosclerosis, and emphasized the importance of maximal medical management including strict control of blood pressure, blood glucose, and lipid levels, obtaining regular exercise, antiplatelet agents, and cessation of smoking.    The patient is currently on a statin: Lipitor.   The patient is currently not on anti-platelet due to risk of bleeding.  Obvious  surgery without anti-platelet is also at higher risk of carotid thrombosis and subsequent stroke.  Additionally, heparinization is necessary during the initial period of clamping the common carotid artery.  Heparin will be reversed shortly after endarterectomy completed.  The patient is aware that without maximal medical management the underlying atherosclerotic disease process will progress, limiting the benefit of any interventions.  Thank you for allowing [place] to participate in this patient's care.   Adele Barthel, MD, FACS [facility] Office: [phone] Pager: [phone]  11/06/2017, 12:16 AM

## 2017-11-06 NOTE — Transfer of Care (Signed)
Immediate Anesthesia Transfer of Care Note  Patient: [name]  Procedure(s) Performed: ENDARTERECTOMY CAROTID (Left Neck)  Patient Location: PACU  Anesthesia Type:General  Level of Consciousness: awake, alert  and patient cooperative  Airway & Oxygen Therapy: Patient Spontanous Breathing  Post-op Assessment: Report given to RN, Post -op Vital signs reviewed and stable and Patient moving all extremities X 4  Post vital signs: Reviewed and stable  Last Vitals:  Vitals Value Taken Time  BP 128/76 11/06/2017 12:24 PM  Temp    Pulse 78 11/06/2017 12:35 PM  Resp 17 11/06/2017 12:35 PM  SpO2 96 % 11/06/2017 12:35 PM  Vitals shown include unvalidated device data.  Last Pain:  Vitals:   11/06/17 0810  TempSrc:   PainSc: 0-No pain         Complications: No apparent anesthesia complications

## 2017-11-06 NOTE — Anesthesia Procedure Notes (Signed)
Arterial Line Insertion Start/End5/27/2019 8:55 AM, 11/06/2017 9:00 AM Performed by: Myrtie Soman, MD, Anatalia Kronk, Kathrin Penner, CRNA, CRNA  Patient location: Pre-op. Preanesthetic checklist: patient identified, IV checked, risks and benefits discussed, surgical consent, monitors and equipment checked, pre-op evaluation and timeout performed Lidocaine 1% used for infiltration Right, radial was placed Catheter size: 20 G Hand hygiene performed  and maximum sterile barriers used   Attempts: 1 Procedure performed without using ultrasound guided technique. Ultrasound Notes:anatomy identified Following insertion, dressing applied and Biopatch.

## 2017-11-06 NOTE — Progress Notes (Signed)
Nutrition Brief Note  Patient identified on the Malnutrition Screening Tool (MST) Report Pt out of room during visit. RD to follow up as appropriate.   Wt Readings from Last 15 Encounters:  11/06/17 189 lb 12.8 oz (86.1 kg)  06/28/17 195 lb (88.5 kg)  11/28/16 194 lb (88 kg)  05/30/16 194 lb (88 kg)  11/19/15 194 lb (88 kg)  10/06/15 190 lb (86.2 kg)  07/06/15 183 lb 3.2 oz (83.1 kg)  09/04/14 183 lb 3.2 oz (83.1 kg)  12/10/13 199 lb (90.3 kg)  11/11/13 199 lb (90.3 kg)  11/05/13 196 lb 13.9 oz (89.3 kg)  05/19/11 190 lb 14.4 oz (86.6 kg)    Body mass index is 25.67 kg/m. Patient meets criteria for overweight based on current BMI.   Current diet order is NPO  [place], [place], [place] Pager [phone] After Hours Pager

## 2017-11-06 NOTE — Anesthesia Preprocedure Evaluation (Signed)
Anesthesia Evaluation  Patient identified by MRN, date of birth, ID band Patient awake    Reviewed: Allergy & Precautions, NPO status , Patient's Chart, lab work & pertinent test results  Airway Mallampati: II  TM Distance: >3 FB Neck ROM: Full    Dental no notable dental hx.    Pulmonary COPD, Current Smoker,    Pulmonary exam normal breath sounds clear to auscultation       Cardiovascular hypertension, + CAD, + Past MI, + CABG and + Peripheral Vascular Disease  negative cardio ROS Normal cardiovascular exam Rhythm:Regular Rate:Normal     Neuro/Psych negative neurological ROS  negative psych ROS   GI/Hepatic Neg liver ROS, GERD  ,(+)     substance abuse  cocaine use,   Endo/Other  negative endocrine ROS  Renal/GU negative Renal ROS  negative genitourinary   Musculoskeletal negative musculoskeletal ROS (+)   Abdominal   Peds negative pediatric ROS (+)  Hematology negative hematology ROS (+)   Anesthesia Other Findings   Reproductive/Obstetrics negative OB ROS                             Anesthesia Physical Anesthesia Plan  ASA: IV and emergent  Anesthesia Plan: General   Post-op Pain Management:    Induction: Intravenous  PONV Risk Score and Plan: 2 and Ondansetron, Dexamethasone and Treatment may vary due to age or medical condition  Airway Management Planned: Oral ETT  Additional Equipment: Arterial line  Intra-op Plan:   Post-operative Plan: Extubation in OR  Informed Consent: I have reviewed the patients History and Physical, chart, labs and discussed the procedure including the risks, benefits and alternatives for the proposed anesthesia with the patient or authorized representative who has indicated his/her understanding and acceptance.   Dental advisory given  Plan Discussed with: CRNA and Surgeon  Anesthesia Plan Comments: (Cocaine positive(again) on this  admission. Informed patient he is at significantly higher risk for periop MI and/or CVA)        Anesthesia Quick Evaluation

## 2017-11-06 NOTE — Progress Notes (Signed)
OT Cancellation Note  Patient Details Name: [name] MRN: [id number] DOB: May 10, 1956   Cancelled Treatment:    Reason Eval/Treat Not Completed: Patient at procedure or test/ unavailable(Pt off the floor for surgery. Will return as schedule allows. Thank you.)  [place], OTR/L Acute Rehab Pager: [phone] Office: [phone] 11/06/2017, 8:54 AM

## 2017-11-06 NOTE — Progress Notes (Signed)
PROGRESS NOTE                                                                                                                                                                                                             Patient Demographics:    [name], is a 62 y.o. male, DOB - 1956-01-29, KAJ:[id number]  Admit date - 11/04/2017   Admitting Physician Kerney Elbe, DO  Outpatient Primary MD for the patient is Dorena Dew, FNP  LOS - 2   Chief Complaint  Patient presents with  . Eye Problem       Brief Narrative   62 y.o. male with medical history significant of CAD status post four-vessel CABG, COPD, history of GERD and history of GI bleed as well as perforation of duodenal ulcer, resents with rapid onset left eye blindness, work-up in ED significant for anemia with hemoglobin of 4.3, has significant left carotid artery disease, anoscopy with no evidence of active GI bleed, give 3 units PRBC, another unit pending, he was seen by neurology, vascular surgery and GI.  MRI showing evidence of acute CVA, has been seen by vascular surgery, plan for left PCA endarterectomy today    Subjective:    [name] today has, No headache, No chest pain, No abdominal pain , new complaints   Assessment  & Plan :    Active Problems:   PUD (peptic ulcer disease)   Tobacco abuse   Pure hypercholesterolemia   S/P CABG x 4   HTN (hypertension)   Cardiomyopathy, ischemic   Chronic obstructive pulmonary disease (HCC)   Cocaine abuse (HCC)   Tobacco dependence   Vision loss of left eye   GIB (gastrointestinal bleeding)   Hypokalemia   Carotid artery stenosis   Acute CVA resulting in left eye vision loss  -Presents with left eye vision loss over last 2 to 3 days, RI brain significant for multifocal sub-centimeters watershed infarcts of the left hemisphere, as well as evidence of high-grade stenosis of left CCA, most likely contributing to  his acute CVA . -Patient was cleared for GI to start on blood thinners, will start on full dose aspirin . -Neurology following  -ophthalmology input greatly appreciated nput greatly appreciated there is no evidence of central retinal artery occlusion given  no retinal edema .. -PT/OT consulted -CRP within  normal limit  Bilateral carotid artery stenosis -Vascular surgery input greatly appreciated, severe left CCA stenosis most likely the contributing factor of his acute CVA, plan for endarteriectomy today  Acute blood loss anemia/iron deficiency anemia -Multifactorial, most likely blood loss anemia from GI bleed, he had endoscopy done today by GI, no evidence of active bleed(part of proximal fundus could not be evaluated given residual food),. -Lubin 4.3 on presentation, is used 4 units PRBC, with good response, hemoglobin is 9.1 today -Low iron, ferritin, will give ferehem - R67 and folic acid low as well, started on supplements . -D/W with GI, patient is clear to start on blood thinners once appropriate  Hypokalemia -repleted  Polysubstance Abuse including Cocaine and THC -Patient admits to me that he did cocaine last week and did marijuana last night trying to increase his appetite - counseled  Tobacco Abuse -Smoking cessation counseling given -Continue with nicotine patch 14 mg transdermally every 24 hours  EtOH Abuse/Concern for EtOH Withdrawal -Patient reports drinking couple nights a week, continue with thiamine and folic acid, on CIWA protocol, no evidence of withdrawals  HLD -LDL is 42 -Continue with atorvastatin 80 mg p.o. nightly  HTN -Pressure is soft  Hx of MI/CAD s/p CABG -Patient has had a quadruple bypass in 2015 and history of MI -Takes aspirin 325 mg p.o. daily as well as atorvastatin 80 mg nightly -Resume aspirin tomorrow if hemoglobin stable  GERD  -Protonix drip  COPD -continue with home medications, no active wheezing,.        Code  Status : Full  Family Communication  : none at bedside  Disposition Plan  : pending further work up.  Consults  : GI, neurology, vascular surgery, ophthalmology  Procedures  : EGD Dr. Michail Sermon 11/05/2017  DVT Prophylaxis  :  SCD  Lab Results  Component Value Date   PLT 278 11/06/2017    Antibiotics  :    Anti-infectives (From admission, onward)   None        Objective:   Vitals:   11/06/17 0400 11/06/17 0505 11/06/17 0820 11/06/17 0821  BP:  128/67  124/70  Pulse:  77    Resp:  (!) 31 (!) 28   Temp:  98.6 F (37 C)    TempSrc:  Oral    SpO2:  97% 100%   Weight: 86.1 kg (189 lb 12.8 oz)     Height: 6' 0.1" (1.831 m)       Wt Readings from Last 3 Encounters:  11/06/17 86.1 kg (189 lb 12.8 oz)  06/28/17 88.5 kg (195 lb)  11/28/16 88 kg (194 lb)     Intake/Output Summary (Last 24 hours) at 11/06/2017 1124 Last data filed at 11/06/2017 0441 Gross per 24 hour  Intake 2917 ml  Output 800 ml  Net 2117 ml     Physical Exam  Awake alert oriented x3  Good air entry bilaterally, no wheezing rales rhonchi  Regular rate and rhythm, no rubs murmurs gallops  +ve B.Sounds, Abd Soft, No tenderness,  No rebound - guarding or rigidity. No Cyanosis, Clubbing or edema, No new Rash or bruise      Data Review:    CBC Recent Labs  Lab 11/04/17 1353 11/04/17 1502 11/05/17 0253 11/05/17 2237 11/06/17 0309  WBC 6.6  --  7.1  --  10.0  HGB 4.7* 4.3* 7.2* 9.6* 9.1*  HCT 15.4* 14.4* 23.0* 30.1* 28.3*  PLT 339  --  305  --  278  MCV 74.4*  --  79.6  --  78.4  MCH 22.7*  --  24.9*  --  25.2*  MCHC 30.5  --  31.3  --  32.2  RDW 21.6*  --  20.4*  --  19.1*  LYMPHSABS 1.7  --   --   --   --   MONOABS 0.7  --   --   --   --   EOSABS 0.4  --   --   --   --   BASOSABS 0.1  --   --   --   --     Chemistries  Recent Labs  Lab 11/04/17 1353 11/05/17 0253 11/06/17 0309  NA 136 135 138  K 2.9* 3.5 3.8  CL 101 104 111  CO2 24 22 21*  GLUCOSE 97 86 96  BUN 11 7 5*    CREATININE 1.05 0.95 0.93  CALCIUM 8.5* 8.4* 8.2*  AST  --  16  --   ALT  --  11*  --   ALKPHOS  --  113  --   BILITOT  --  2.0*  --    ------------------------------------------------------------------------------------------------------------------ Recent Labs    11/05/17 0253  CHOL 97  HDL 44  LDLCALC 42  TRIG 57  CHOLHDL 2.2    Lab Results  Component Value Date   HGBA1C 5.2 11/05/2017   ------------------------------------------------------------------------------------------------------------------ Recent Labs    11/05/17 0253  TSH 0.478   ------------------------------------------------------------------------------------------------------------------ Recent Labs    11/04/17 1549  VITAMINB12 274  FOLATE 6.6  FERRITIN 4*  TIBC 479*  IRON 5*  RETICCTPCT 2.2    Coagulation profile Recent Labs  Lab 11/04/17 1353 11/05/17 0253  INR 0.93 1.01    No results for input(s): DDIMER in the last 72 hours.  Cardiac Enzymes No results for input(s): CKMB, TROPONINI, MYOGLOBIN in the last 168 hours.  Invalid input(s): CK ------------------------------------------------------------------------------------------------------------------    Component Value Date/Time   BNP 178.8 (H) 07/02/2015 1134    Inpatient Medications  Scheduled Meds: . [MAR Hold] atorvastatin  80 mg Oral q1800  . [MAR Hold] cyanocobalamin  1,000 mcg Intramuscular Daily  . [MAR Hold] fluticasone furoate-vilanterol  1 puff Inhalation Daily  . [MAR Hold] folic acid  1 mg Oral Daily  . [MAR Hold] multivitamin with minerals  1 tablet Oral Daily  . [MAR Hold] nicotine  14 mg Transdermal Daily  . [MAR Hold] pantoprazole  40 mg Intravenous Q12H  . [MAR Hold] thiamine  100 mg Oral Daily   Continuous Infusions: . 0.9 % NaCl with KCl 40 mEq / L 75 mL/hr (11/06/17 0948)  . dextran 40% in 0.9% NaCl    . pantoprozole (PROTONIX) infusion 8 mg/hr (11/06/17 0948)   PRN Meds:.0.9 % irrigation (POUR  BTL), [MAR Hold] acetaminophen **OR** [MAR Hold] acetaminophen, [MAR Hold] albuterol, dextran 40% in 0.9% NaCl, [MAR Hold] fluticasone, heparin irrigation 6000 unit, [MAR Hold] LORazepam, [MAR Hold] ondansetron **OR** [MAR Hold] ondansetron (ZOFRAN) IV, [MAR Hold] polyethylene glycol, [MAR Hold] traMADol  Micro Results Recent Results (from the past 240 hour(s))  Urine culture     Status: None   Collection Time: 11/04/17 11:46 PM  Result Value Ref Range Status   Specimen Description   Final    URINE, CLEAN CATCH Performed at [facility], [place] [address], [place], [place] [address]    Special Requests   Final    NONE Performed at [facility], [place] [address], [place], [place] [address]    Culture  Final    NO GROWTH Performed at [facility], [place] [address], [place], [place] [address]    Report Status 11/06/2017 FINAL  Final  MRSA PCR Screening     Status: Abnormal   Collection Time: 11/05/17  2:55 AM  Result Value Ref Range Status   MRSA by PCR INVALID RESULTS, SPECIMEN SENT FOR CULTURE (A) NEGATIVE Final    Comment:        The GeneXpert MRSA Assay (FDA approved for NASAL specimens only), is one component of a comprehensive MRSA colonization surveillance program. It is not intended to diagnose MRSA infection nor to guide or monitor treatment for MRSA infections.   MRSA culture     Status: None   Collection Time: 11/05/17  5:45 AM  Result Value Ref Range Status   Specimen Description NASAL SWAB  Final   Special Requests NONE  Final   Culture   Final    NO MRSA DETECTED Performed at [facility], [address], [place], [place] [address]    Report Status 11/06/2017 FINAL  Final  MRSA PCR Screening     Status: None   Collection Time: 11/05/17  6:10 PM  Result Value Ref Range Status   MRSA by PCR NEGATIVE NEGATIVE Final    Comment:        The GeneXpert MRSA Assay (FDA approved for NASAL specimens only), is one  component of a comprehensive MRSA colonization surveillance program. It is not intended to diagnose MRSA infection nor to guide or monitor treatment for MRSA infections. Performed at [facility], [place] [address], [place], [place] [address]     Radiology Reports Ct Angio Head W Or Wo Contrast  Result Date: 11/04/2017 CLINICAL DATA:  New onset of vision loss in the left eye. The patient has no perception of light in the left eye. He began to have diminished vision in the left eye last evening. He awoke with no vision in the left eye today. EXAM: CT ANGIOGRAPHY HEAD AND NECK TECHNIQUE: Multidetector CT imaging of the head and neck was performed using the standard protocol during bolus administration of intravenous contrast. Multiplanar CT image reconstructions and MIPs were obtained to evaluate the vascular anatomy. Carotid stenosis measurements (when applicable) are obtained utilizing NASCET criteria, using the distal internal carotid diameter as the denominator. CONTRAST:  154mL ISOVUE-370 IOPAMIDOL (ISOVUE-370) INJECTION 76% COMPARISON:  None. FINDINGS: CT HEAD FINDINGS Brain: Noncontrast imaging the brain demonstrates scattered periventricular white matter hypoattenuation bilaterally. In age indeterminate lacunar infarct is present within the anterior limb of the left internal capsule. A ridging is white matter hypoattenuation is present in the inferior cerebellum bilaterally. No acute hemorrhage or mass lesion is present. The ventricles are of normal size. No significant extra-axial fluid collection is present. Vascular: No hyperdense vessel or unexpected calcification. Skull: Calvarium is intact. No focal lytic or blastic lesions are present. Sinuses: The paranasal sinuses and mastoid air cells are clear. Orbits: Bilateral globes and orbits are within normal limits. Review of the MIP images confirms the above findings CTA NECK FINDINGS Aortic arch: A 3 vessel arch configuration is present.  Atherosclerotic calcifications are present without significant stenosis at the origins the great vessels. There is no aneurysm. Right carotid system: The right common carotid artery is within normal limits. Mild tortuosity is present in the proximal cervical right ICA. There is focal calcification along the posterior wall of the distal right ICA. No stenosis is present. Left carotid system: The left  common carotid artery is within normal limits proximally. There is focal stenosis of the mid left common carotid artery. The lumen is narrowed to 1.6 mm. This compares with a more distal segment of 6.5 mm. Soft tissue plaque is noted along the posterior aspect of the left carotid bulb without significant luminal stenosis. Focal calcification is present along the posterior wall of the distal left internal carotid artery just below the skull base without significant stenosis. Vertebral arteries: The vertebral arteries are codominant. Both vertebral arteries originate from the subclavian arteries without significant stenoses. There is no significant stenosis of either vertebral artery in the neck. Skeleton: Focal endplate changes are present with uncovertebral spurring at C6-7. Bilateral osseous foraminal narrowing is worse on the right. Vertebral body heights and alignment are otherwise normal. The patient is status post median sternotomy. An unerupted right maxillary and I tooth is present. An adjacent hypoplastic on are up to tooth is present. The patient is otherwise edentulous along the maxilla. Other neck: Soft tissue lipoma is present along the left occipital scalp. The soft tissues of the neck are unremarkable otherwise. Thyroid is within normal limits. Salivary glands are unremarkable. No significant adenopathy is present. Upper chest: Centrilobular emphysematous changes are present. No focal nodule or mass lesion is present. No significant pleural effusion is present. Review of the MIP images confirms the above  findings CTA HEAD FINDINGS Anterior circulation: The internal carotid arteries are within normal limits bilaterally from the skull base through the ICA termini. The right A1 segment is dominant. The anterior communicating artery is patent. MCA bifurcation is normal bilaterally. The ACA and MCA branch vessels are within normal limits. Posterior circulation: The vertebral arteries are codominant. Vertebrobasilar junction is normal. PICA origins are visualized and within normal limits. Both posterior cerebral arteries originate from the basilar tip. There is mild irregularity of distal branch vessels without a significant proximal stenosis or occlusion. Venous sinuses: The dural sinuses are patent. Right transverse sinus is dominant. The straight sinus and deep cerebral veins are intact. Cortical veins are unremarkable. Anatomic variants: None Delayed phase: Post-contrast images demonstrate no pathologic enhancement. Review of the MIP images confirms the above findings IMPRESSION: 1. High-grade stenosis of the mid left common carotid artery measuring greater than 75% relative to the more distal vessels. 2. Additional soft tissue plaque in the posterior wall of the proximal left internal carotid artery without focal stenosis. 3. Focal calcification in the posterior wall of the distal internal carotid artery bilaterally just below the skull base. This could be related to remote trauma. There is no lumen compromise associated. 4. No focal disease at the ophthalmic segment on the left. 5. Scattered white matter hypoattenuation bilaterally likely reflects the sequela of chronic microvascular ischemia. 6. No acute cortical infarct. These results were called by telephone at the time of interpretation on 11/04/2017 at 3:58 pm to [facility], [place] , who verbally acknowledged these results. Electronically Signed   By: San Morelle M.D.   On: 11/04/2017 16:02   Ct Head Wo Contrast  Result Date: 11/04/2017 CLINICAL  DATA:  New onset of vision loss in the left eye. The patient has no perception of light in the left eye. He began to have diminished vision in the left eye last evening. He awoke with no vision in the left eye today. EXAM: CT ANGIOGRAPHY HEAD AND NECK TECHNIQUE: Multidetector CT imaging of the head and neck was performed using the standard protocol during bolus administration of intravenous contrast. Multiplanar CT image  reconstructions and MIPs were obtained to evaluate the vascular anatomy. Carotid stenosis measurements (when applicable) are obtained utilizing NASCET criteria, using the distal internal carotid diameter as the denominator. CONTRAST:  166mL ISOVUE-370 IOPAMIDOL (ISOVUE-370) INJECTION 76% COMPARISON:  None. FINDINGS: CT HEAD FINDINGS Brain: Noncontrast imaging the brain demonstrates scattered periventricular white matter hypoattenuation bilaterally. In age indeterminate lacunar infarct is present within the anterior limb of the left internal capsule. A ridging is white matter hypoattenuation is present in the inferior cerebellum bilaterally. No acute hemorrhage or mass lesion is present. The ventricles are of normal size. No significant extra-axial fluid collection is present. Vascular: No hyperdense vessel or unexpected calcification. Skull: Calvarium is intact. No focal lytic or blastic lesions are present. Sinuses: The paranasal sinuses and mastoid air cells are clear. Orbits: Bilateral globes and orbits are within normal limits. Review of the MIP images confirms the above findings CTA NECK FINDINGS Aortic arch: A 3 vessel arch configuration is present. Atherosclerotic calcifications are present without significant stenosis at the origins the great vessels. There is no aneurysm. Right carotid system: The right common carotid artery is within normal limits. Mild tortuosity is present in the proximal cervical right ICA. There is focal calcification along the posterior wall of the distal right ICA.  No stenosis is present. Left carotid system: The left common carotid artery is within normal limits proximally. There is focal stenosis of the mid left common carotid artery. The lumen is narrowed to 1.6 mm. This compares with a more distal segment of 6.5 mm. Soft tissue plaque is noted along the posterior aspect of the left carotid bulb without significant luminal stenosis. Focal calcification is present along the posterior wall of the distal left internal carotid artery just below the skull base without significant stenosis. Vertebral arteries: The vertebral arteries are codominant. Both vertebral arteries originate from the subclavian arteries without significant stenoses. There is no significant stenosis of either vertebral artery in the neck. Skeleton: Focal endplate changes are present with uncovertebral spurring at C6-7. Bilateral osseous foraminal narrowing is worse on the right. Vertebral body heights and alignment are otherwise normal. The patient is status post median sternotomy. An unerupted right maxillary and I tooth is present. An adjacent hypoplastic on are up to tooth is present. The patient is otherwise edentulous along the maxilla. Other neck: Soft tissue lipoma is present along the left occipital scalp. The soft tissues of the neck are unremarkable otherwise. Thyroid is within normal limits. Salivary glands are unremarkable. No significant adenopathy is present. Upper chest: Centrilobular emphysematous changes are present. No focal nodule or mass lesion is present. No significant pleural effusion is present. Review of the MIP images confirms the above findings CTA HEAD FINDINGS Anterior circulation: The internal carotid arteries are within normal limits bilaterally from the skull base through the ICA termini. The right A1 segment is dominant. The anterior communicating artery is patent. MCA bifurcation is normal bilaterally. The ACA and MCA branch vessels are within normal limits. Posterior  circulation: The vertebral arteries are codominant. Vertebrobasilar junction is normal. PICA origins are visualized and within normal limits. Both posterior cerebral arteries originate from the basilar tip. There is mild irregularity of distal branch vessels without a significant proximal stenosis or occlusion. Venous sinuses: The dural sinuses are patent. Right transverse sinus is dominant. The straight sinus and deep cerebral veins are intact. Cortical veins are unremarkable. Anatomic variants: None Delayed phase: Post-contrast images demonstrate no pathologic enhancement. Review of the MIP images confirms the above findings IMPRESSION:  1. High-grade stenosis of the mid left common carotid artery measuring greater than 75% relative to the more distal vessels. 2. Additional soft tissue plaque in the posterior wall of the proximal left internal carotid artery without focal stenosis. 3. Focal calcification in the posterior wall of the distal internal carotid artery bilaterally just below the skull base. This could be related to remote trauma. There is no lumen compromise associated. 4. No focal disease at the ophthalmic segment on the left. 5. Scattered white matter hypoattenuation bilaterally likely reflects the sequela of chronic microvascular ischemia. 6. No acute cortical infarct. These results were called by telephone at the time of interpretation on 11/04/2017 at 3:58 pm to [facility], [place] , who verbally acknowledged these results. Electronically Signed   By: San Morelle M.D.   On: 11/04/2017 16:02   Ct Angio Neck W And/or Wo Contrast  Result Date: 11/04/2017 CLINICAL DATA:  New onset of vision loss in the left eye. The patient has no perception of light in the left eye. He began to have diminished vision in the left eye last evening. He awoke with no vision in the left eye today. EXAM: CT ANGIOGRAPHY HEAD AND NECK TECHNIQUE: Multidetector CT imaging of the head and neck was performed using  the standard protocol during bolus administration of intravenous contrast. Multiplanar CT image reconstructions and MIPs were obtained to evaluate the vascular anatomy. Carotid stenosis measurements (when applicable) are obtained utilizing NASCET criteria, using the distal internal carotid diameter as the denominator. CONTRAST:  150mL ISOVUE-370 IOPAMIDOL (ISOVUE-370) INJECTION 76% COMPARISON:  None. FINDINGS: CT HEAD FINDINGS Brain: Noncontrast imaging the brain demonstrates scattered periventricular white matter hypoattenuation bilaterally. In age indeterminate lacunar infarct is present within the anterior limb of the left internal capsule. A ridging is white matter hypoattenuation is present in the inferior cerebellum bilaterally. No acute hemorrhage or mass lesion is present. The ventricles are of normal size. No significant extra-axial fluid collection is present. Vascular: No hyperdense vessel or unexpected calcification. Skull: Calvarium is intact. No focal lytic or blastic lesions are present. Sinuses: The paranasal sinuses and mastoid air cells are clear. Orbits: Bilateral globes and orbits are within normal limits. Review of the MIP images confirms the above findings CTA NECK FINDINGS Aortic arch: A 3 vessel arch configuration is present. Atherosclerotic calcifications are present without significant stenosis at the origins the great vessels. There is no aneurysm. Right carotid system: The right common carotid artery is within normal limits. Mild tortuosity is present in the proximal cervical right ICA. There is focal calcification along the posterior wall of the distal right ICA. No stenosis is present. Left carotid system: The left common carotid artery is within normal limits proximally. There is focal stenosis of the mid left common carotid artery. The lumen is narrowed to 1.6 mm. This compares with a more distal segment of 6.5 mm. Soft tissue plaque is noted along the posterior aspect of the left  carotid bulb without significant luminal stenosis. Focal calcification is present along the posterior wall of the distal left internal carotid artery just below the skull base without significant stenosis. Vertebral arteries: The vertebral arteries are codominant. Both vertebral arteries originate from the subclavian arteries without significant stenoses. There is no significant stenosis of either vertebral artery in the neck. Skeleton: Focal endplate changes are present with uncovertebral spurring at C6-7. Bilateral osseous foraminal narrowing is worse on the right. Vertebral body heights and alignment are otherwise normal. The patient is status post median sternotomy. An  unerupted right maxillary and I tooth is present. An adjacent hypoplastic on are up to tooth is present. The patient is otherwise edentulous along the maxilla. Other neck: Soft tissue lipoma is present along the left occipital scalp. The soft tissues of the neck are unremarkable otherwise. Thyroid is within normal limits. Salivary glands are unremarkable. No significant adenopathy is present. Upper chest: Centrilobular emphysematous changes are present. No focal nodule or mass lesion is present. No significant pleural effusion is present. Review of the MIP images confirms the above findings CTA HEAD FINDINGS Anterior circulation: The internal carotid arteries are within normal limits bilaterally from the skull base through the ICA termini. The right A1 segment is dominant. The anterior communicating artery is patent. MCA bifurcation is normal bilaterally. The ACA and MCA branch vessels are within normal limits. Posterior circulation: The vertebral arteries are codominant. Vertebrobasilar junction is normal. PICA origins are visualized and within normal limits. Both posterior cerebral arteries originate from the basilar tip. There is mild irregularity of distal branch vessels without a significant proximal stenosis or occlusion. Venous sinuses: The  dural sinuses are patent. Right transverse sinus is dominant. The straight sinus and deep cerebral veins are intact. Cortical veins are unremarkable. Anatomic variants: None Delayed phase: Post-contrast images demonstrate no pathologic enhancement. Review of the MIP images confirms the above findings IMPRESSION: 1. High-grade stenosis of the mid left common carotid artery measuring greater than 75% relative to the more distal vessels. 2. Additional soft tissue plaque in the posterior wall of the proximal left internal carotid artery without focal stenosis. 3. Focal calcification in the posterior wall of the distal internal carotid artery bilaterally just below the skull base. This could be related to remote trauma. There is no lumen compromise associated. 4. No focal disease at the ophthalmic segment on the left. 5. Scattered white matter hypoattenuation bilaterally likely reflects the sequela of chronic microvascular ischemia. 6. No acute cortical infarct. These results were called by telephone at the time of interpretation on 11/04/2017 at 3:58 pm to [facility], [place] , who verbally acknowledged these results. Electronically Signed   By: San Morelle M.D.   On: 11/04/2017 16:02   Mr Jeri Cos XI Contrast  Result Date: 11/05/2017 CLINICAL DATA:  LEFT eye vision loss.  Stroke suspected. EXAM: MRI HEAD AND ORBITS WITHOUT AND WITH CONTRAST TECHNIQUE: Multiplanar, multiecho pulse sequences of the brain and surrounding structures were obtained without and with intravenous contrast. Multiplanar, multiecho pulse sequences of the orbits and surrounding structures were obtained including fat saturation techniques, before and after intravenous contrast administration. CONTRAST:  66mL MULTIHANCE GADOBENATE DIMEGLUMINE 529 MG/ML IV SOLN COMPARISON:  CTA head neck 11/04/2017. FINDINGS: MRI HEAD FINDINGS Brain: Multiple small foci of restricted diffusion are seen in the LEFT hemisphere, affecting the subcortical  and periventricular deep white matter largely sparing the cortex, consistent with multiple watershed infarcts. No hemorrhage, mass lesion, hydrocephalus, or extra-axial fluid. Generalized atrophy, premature for age. Moderate T2 and FLAIR hyperintensities in the white matter, consistent with small vessel disease. Remote LEFT periventricular infarct near the caudate. Post infusion, no abnormal enhancement of the brain or meninges. Vascular: Normal flow voids. Skull and upper cervical spine: Normal marrow signal. Other: None. MRI ORBITS FINDINGS Orbits: No traumatic or inflammatory finding. Globes, optic nerves, orbital fat, extraocular muscles, vascular structures, and lacrimal glands are normal. Visualized sinuses: Clear. Soft tissues: Negative. Limited intracranial: Reported separately. IMPRESSION: MRI brain demonstrating multifocal subcentimeter watershed infarcts of the LEFT hemisphere. High-grade stenosis of the LEFT  CCA is likely contributory; multiple emboli are possible, less favored. Atrophy and small vessel disease. Negative MRI of the orbits. Electronically Signed   By: Staci Righter M.D.   On: 11/05/2017 17:24   Mr Rosealee Albee TD Contrast  Result Date: 11/05/2017 CLINICAL DATA:  LEFT eye vision loss.  Stroke suspected. EXAM: MRI HEAD AND ORBITS WITHOUT AND WITH CONTRAST TECHNIQUE: Multiplanar, multiecho pulse sequences of the brain and surrounding structures were obtained without and with intravenous contrast. Multiplanar, multiecho pulse sequences of the orbits and surrounding structures were obtained including fat saturation techniques, before and after intravenous contrast administration. CONTRAST:  52mL MULTIHANCE GADOBENATE DIMEGLUMINE 529 MG/ML IV SOLN COMPARISON:  CTA head neck 11/04/2017. FINDINGS: MRI HEAD FINDINGS Brain: Multiple small foci of restricted diffusion are seen in the LEFT hemisphere, affecting the subcortical and periventricular deep white matter largely sparing the cortex,  consistent with multiple watershed infarcts. No hemorrhage, mass lesion, hydrocephalus, or extra-axial fluid. Generalized atrophy, premature for age. Moderate T2 and FLAIR hyperintensities in the white matter, consistent with small vessel disease. Remote LEFT periventricular infarct near the caudate. Post infusion, no abnormal enhancement of the brain or meninges. Vascular: Normal flow voids. Skull and upper cervical spine: Normal marrow signal. Other: None. MRI ORBITS FINDINGS Orbits: No traumatic or inflammatory finding. Globes, optic nerves, orbital fat, extraocular muscles, vascular structures, and lacrimal glands are normal. Visualized sinuses: Clear. Soft tissues: Negative. Limited intracranial: Reported separately. IMPRESSION: MRI brain demonstrating multifocal subcentimeter watershed infarcts of the LEFT hemisphere. High-grade stenosis of the LEFT CCA is likely contributory; multiple emboli are possible, less favored. Atrophy and small vessel disease. Negative MRI of the orbits. Electronically Signed   By: Staci Righter M.D.   On: 11/05/2017 17:24      Phillips Climes M.D on 11/06/2017 at 11:24 AM  Between 7am to 7pm - Pager - [phone]  After 7pm go to [email or web address] - password [facility]  [facility] -  Office  [phone]

## 2017-11-06 NOTE — Progress Notes (Signed)
PT Cancellation Note  Patient Details Name: [name] MRN: [id number] DOB: 08-Jun-1956   Cancelled Treatment:    Reason Eval/Treat Not Completed: Patient at procedure or test/unavailable. Will continue attempts to evaluate pt.   [place] 11/06/2017, 8:42 AM [place]

## 2017-11-06 NOTE — Op Note (Addendum)
OPERATIVE NOTE  PROCEDURE:   1.  left common carotid endarterectomy with bovine patch angioplasty 2.  left intraoperative carotid ultrasound  PRE-OPERATIVE DIAGNOSIS: right symptomatic carotid stenosis >70%  POST-OPERATIVE DIAGNOSIS: same as above   SURGEON: Adele Barthel, MD  ASSISTANT(S): Dagoberto Ligas, PAC   ANESTHESIA: general  ESTIMATED BLOOD LOSS: 100 cc  FINDING(S): 1.  Continuous Doppler audible flow signatures are appropriate for the common carotid artery  2.  No evidence of intimal flap visualized on transverse or longitudinal ultrasonography. 3.  Carotid plaque: smooth, non-necrotic core, doubt this is source of embolization 4.  Vagus nerve: anterior position, required mobilization  SPECIMEN(S):  None  INDICATIONS:   [name] is a 62 y.o. male who presents with left symptomatic common carotid artery >70%.  This patient present with severe anemia and left common carotid artery stenosis >70% which was felt might have caused his watershed distribution of left sided stroke.  I discussed with the patient the risks, benefits, and alternatives to carotid endarterectomy.  I discussed the procedural details of carotid endarterectomy with the patient.  The patient is aware that the risks of carotid endarterectomy include but are not limited to: bleeding, infection, stroke, myocardial infarction, death, cranial nerve injuries both temporary and permanent, neck hematoma, possible airway compromise, labile blood pressure post-operatively, cerebral hyperperfusion syndrome, and possible need for additional interventions in the future. The patient is aware of the risks and agrees to proceed forward with the procedure.   DESCRIPTION: After full informed written consent was obtained from the patient, the patient was brought back to the operating room and placed supine upon the operating table.  Prior to induction, the patient received IV antibiotics.  After obtaining adequate  anesthesia, the patient was placed into semi-Fowler position with a shoulder roll in place and the patient's neck slightly hyperextended and rotated away from the surgical site.    The patient was prepped in the standard fashion for a left carotid endarterectomy.   I scanned the left neck with a doppler to identify the most likely segment of stenosis in the left common carotid artery.   I gave 9000 units of Heparin which was a therapeutic bolus.  I made an incision anterior to the sternocleidomastoid muscle, centered on the stenotic segment, and dissected down through the subcutaneous tissue.  The platysmas was opened with electrocautery.  Then I dissected down to the internal jugular vein.  This was dissected posteriorly until I obtained visualization of the common carotid artery. The vagus nerve was found to be adherent to the anterior wall of the common carotid artery.  I dissected this nerve off the common carotid artery and moved it out of the active dissection field.  The proximal common carotid artery was dissected out and then an umbilical tape was placed around the common carotid artery and I loosely applied a Rumel tourniquet.  I then dissected in a periadventitial fashion along the common carotid artery until I found the area most likely to be the stenotic lesion.  I verified this with the [place].  I then dissected another 3 cm distal.  I did NOT formal dissect out the bifurcation, as I do not plan on working on the internal carotid artery, which did not appear to have significant disease on CT scan.  I dissected out the distal common carotid artery and placed an umbilical tape around this segment and loosely applied a Rumel tourniquet.    I clamped the common carotid artery proximally and  distally.  I then made an arteriotomy in the common carotid artery with a 11 blade, and extended the arteriotomy with a Potts scissor proximally and distally, identifying the stenosis in the process.  This did  not appear to be an ulcerated plaque.   At this point, I took the 10 shunt that previously been prepared and I inserted it into the distal common carotid artery.  The Rumel tourniquet was then applied to this end of the shunt.  I unclamped the shunt to verify retrograde blood flow in the internal carotid artery.  I then placed the other end of the shunt into the common carotid artery after unclamping the artery.  The Rumel was tightened down around the shunt.  At this point, I verified blood flow in the shunt with a continuous doppler.    At this point, I started the endarterectomy in the common carotid artery with a Technical brewer and carried this dissection down into the common carotid artery circumferentially.  Then I transected the plaque at a segment proximally where it was adherent.  I then carried this dissection distally into the distal common carotid artery.  I was able to removed the plaque intact.  There did not appear to be a necrotic core or and friable calcification.  The lesion appeared to >70% but not sub-total occluded.  I then spent the next 10 minutes removing intimal flaps and loose debris.  Eventually I reached the point where the residual plaque was densely adherent and any further dissection would compromise the integrity of the wall.  After verifying that there was no more loose intimal flaps or debris, I re-interrogated the entirety of this carotid artery.  At this point, I was satisfied that the minimal remaining disease was densely adherent to the wall and wall integrity was intact.    At this point, I then fashioned a bovine pericardial patch for the geometry of this artery and sewed it in place with two running stitch of 6-0 Prolene, one from each end.  Prior to completing this patch angioplasty, I tried to removed the distal end of the shunt, but both ends came out at the same time.  I compressed the common carotid artery proximally and distally and removed the shunt.  I  clamped the distal common carotid artery first, taking care to avoid clamping the vagus.  I then clamped the proximal common carotid artery, similarly avoiding the vagus nerve.  I then backbled the distal common carotid artery.  No clot was noted.  I then allowed the proximal common carotid artery to bleed antegrade.  There was no clot noted.  Then I instilled heparinized saline in this patched artery and then completed the patch angioplasty in the usual fashion.    I then interrogated this patient's common carotid artery with the continuous Doppler.  The audible waveforms were consistent with the expected characteristics for the common carotid artery.  The Sonosite probe was then used to interrogate the carotid artery in both longitudinal and transverse views.  No intimal flaps or residual disease were noted.  At this point, I washed out the wound, and placed Avitene throughout.  I also gave the patient 50 mg of protamine to reverse his anticoagulation.   After waiting a few minutes, I removed the Avitene and washed out the wound.  There was no more active bleeding in the surgical site.     I then reapproximated the platysma muscle with a running stitch of 3-0 Vicryl.  The skin  was then reapproximated with a running subcuticular 4-0 Monocryl stitch.  The skin was then cleaned, dried and Dermabond was used to reinforce the skin closure.    The patient woke without any problems, neurologically intact.    COMPLICATIONS: none  CONDITION: stable   Adele Barthel, MD, [facility] [place] Office: [phone] Pager: [phone]  11/06/2017, 11:50 AM

## 2017-11-07 ENCOUNTER — Telehealth: Payer: Self-pay | Admitting: Vascular Surgery

## 2017-11-07 ENCOUNTER — Encounter (HOSPITAL_COMMUNITY): Payer: Self-pay | Admitting: *Deleted

## 2017-11-07 LAB — CBC
HEMATOCRIT: 27.2 % — AB (ref 39.0–52.0)
HEMOGLOBIN: 8.6 g/dL — AB (ref 13.0–17.0)
MCH: 25.1 pg — ABNORMAL LOW (ref 26.0–34.0)
MCHC: 31.6 g/dL (ref 30.0–36.0)
MCV: 79.5 fL (ref 78.0–100.0)
Platelets: 258 10*3/uL (ref 150–400)
RBC: 3.42 MIL/uL — AB (ref 4.22–5.81)
RDW: 20.1 % — AB (ref 11.5–15.5)
WBC: 10.6 10*3/uL — ABNORMAL HIGH (ref 4.0–10.5)

## 2017-11-07 LAB — BASIC METABOLIC PANEL
ANION GAP: 7 (ref 5–15)
BUN: 6 mg/dL (ref 6–20)
CALCIUM: 8.3 mg/dL — AB (ref 8.9–10.3)
CO2: 20 mmol/L — AB (ref 22–32)
Chloride: 112 mmol/L — ABNORMAL HIGH (ref 101–111)
Creatinine, Ser: 0.97 mg/dL (ref 0.61–1.24)
Glucose, Bld: 133 mg/dL — ABNORMAL HIGH (ref 65–99)
Potassium: 4.5 mmol/L (ref 3.5–5.1)
Sodium: 139 mmol/L (ref 135–145)

## 2017-11-07 LAB — GLUCOSE, CAPILLARY: Glucose-Capillary: 130 mg/dL — ABNORMAL HIGH (ref 65–99)

## 2017-11-07 MED ORDER — NYSTATIN 100000 UNIT/ML MT SUSP
5.0000 mL | Freq: Four times a day (QID) | OROMUCOSAL | Status: DC
  Administered 2017-11-07 – 2017-11-08 (×3): 500000 [IU] via ORAL
  Filled 2017-11-07 (×2): qty 5

## 2017-11-07 MED ORDER — CLOPIDOGREL BISULFATE 75 MG PO TABS
75.0000 mg | ORAL_TABLET | Freq: Every day | ORAL | Status: DC
  Administered 2017-11-07 – 2017-11-08 (×2): 75 mg via ORAL
  Filled 2017-11-07 (×2): qty 1

## 2017-11-07 NOTE — Progress Notes (Signed)
Stroke Team Progress Note  HPI ( Dr Mannie Stabile) [name] is an 62 y.o. male.  HPI: 2 day history of graying out vision and then woke up with total vision loss OS today.  He has NOT been seen by an ophthalmologist.  No eye pain .  No headache.  CT Brain was normal.  CTA Brain and Neck is normal except for high grade stenosis of left common carotid artery.  He also has known duodenal ulcers with recent bleeding causing Hg 4.  He is getting blood transfusion now and GI is to see him soon.  He has not been given an antiplatelets due to GI bleed. UDS is positive for cocaine and THC.   SUBJECTIVE  patient had elective leftt carotid endarterectomy yesterday by Dr. Bridgett Larsson and the procedure went well. He has persistent left eye vision loss but states his gait and balance has improved. He has no new complaints today. HPI** OBJECTIVE Most recent Vital Signs: Temp: 97.5 F (36.4 C) (05/28 1116) Temp Source: Oral (05/28 1116) BP: 132/64 (05/28 0813) Pulse Rate: 63 (05/28 1116) Respiratory Rate: 19 O2 Saturdation: 100%  CBG (last 3)  Recent Labs    11/05/17 0620 11/06/17 0617 11/07/17 0553  GLUCAP 93 96 130*       Studies:  CTA neck and brain : 1. High-grade stenosis of the mid left common carotid artery measuring greater than 75% relative to the more distal vessels. 2. Additional soft tissue plaque in the posterior wall of the proximal left internal carotid artery without focal stenosis. 3. Focal calcification in the posterior wall of the distal internal carotid artery bilaterally just below the skull base. This could be related to remote trauma. There is no lumen compromise associated. 4. No focal disease at the ophthalmic segment on the left. 5. Scattered white matter hypoattenuation bilaterally likely reflects the sequela of chronic microvascular ischemia. 6. No acute cortical infarct. MRI   brain demonstrating multifocal subcentimeter watershed infarcts of the LEFT hemisphere.    ECHO pending LDL 42 mg% HbA1c  5.2  Physical Exam:    Pleasant middle-aged African-American male currently not in distress. Has left carotid and endarterectomy surgical wound in the left neck. . Afebrile. Head is nontraumatic. Neck is supple without bruit.    Cardiac exam no murmur or gallop. Lungs are clear to auscultation. Distal pulses are well felt. Neurological Exam ;  Awake  Alert oriented x 3. Normal speech and language.eye movements full without nystagmus.fundi were not visualized. Vision acuity is limited to only light perception in the left eye and  is normal in the right eye. Left pupil is unreactive . Hearing is normal. Palatal movements are normal. Face symmetric. Tongue midline. Normal strength, tone, reflexes and coordination. Normal sensation. Gait deferred.  ASSESSMENT [name] is a 62 y.o. male with a subacute vision loss in the left eye and small watershed left hemispheric infarctsand likely due to atheroembolism from high-grade left proximal carotid stenosis . He underwent emergent left carotid endarterectomy on 11/06/2017  Hospital day # 3  TREATMENT/PLAN  I have personally examined this patient, reviewed notes, independently viewed imaging studies, participated in medical decision making and plan of care.ROS completed by me personally and pertinent positives fully documented  I have made any additions or clarifications directly to the above note. Continue Plavix for stroke prevention and aggressive risk factor modification with LDL cholesterol goal below 70 mg percent, hypertension with blood pressure goal below 130/90 and diabetes with hemoglobin A1c  goal below 6.5%. Patient was also counseled to eat a healthy diet and be active and exercise regularly.follow-up as an outpatient in stroke clinic in 6 weeks. I spent  35 minutes in total face-to-face time with the patient, more than 50% of which was spent in counseling and coordination of care, reviewing test  results, reviewing medication and discussing or reviewing the diagnosis of symptomatic left carotid stenosis   , the prognosis and treatment options.   Antony Contras, MD Medical Director [facility] Pager: [phone] 11/07/2017 12:51 PM

## 2017-11-07 NOTE — Evaluation (Signed)
Physical Therapy Evaluation Patient Details Name: [name] MRN: [id number] DOB: 30-Sep-1955 Today's Date: 11/07/2017   History of Present Illness  Pt adm with severe anemia and sudden onset lt eye blindness. MRI showed multifocal subcentimeter watershed infarcts of lt hemisphere. Pt transfused 4 units and underwent EGD which showed no active bleeding. Pt underwent lt carotid endarterectomy on 5/27.  PMH - GIB, copd, Cabg, polysubstance abuse  Clinical Impression  Pt doing well with mobility and no further PT needed.  Ready for dc from PT standpoint.      Follow Up Recommendations No PT follow up    Equipment Recommendations  None recommended by PT    Recommendations for Other Services       Precautions / Restrictions Precautions Precautions: None Restrictions Weight Bearing Restrictions: No      Mobility  Bed Mobility               General bed mobility comments: Pt up in chair  Transfers Overall transfer level: Independent Equipment used: None                Ambulation/Gait Ambulation/Gait assistance: Independent Ambulation Distance (Feet): 950 Feet Assistive device: None Gait Pattern/deviations: WFL(Within Functional Limits)   Gait velocity interpretation: >4.37 ft/sec, indicative of normal walking speed General Gait Details: Steady gait  Stairs            Wheelchair Mobility    Modified Rankin (Stroke Patients Only) Modified Rankin (Stroke Patients Only) Pre-Morbid Rankin Score: No symptoms Modified Rankin: No symptoms     Balance Overall balance assessment: No apparent balance deficits (not formally assessed)                                           Pertinent Vitals/Pain Pain Assessment: No/denies pain    Home Living Family/patient expects to be discharged to:: Private residence Living Arrangements: Alone   Type of Home: House Home Access: Stairs to enter Entrance Stairs-Rails: Right Entrance  Stairs-Number of Steps: 3-4 Home Layout: One level Home Equipment: None      Prior Function Level of Independence: Independent               Hand Dominance   Dominant Hand: Right    Extremity/Trunk Assessment   Upper Extremity Assessment Upper Extremity Assessment: Defer to OT evaluation    Lower Extremity Assessment Lower Extremity Assessment: Overall WFL for tasks assessed       Communication   Communication: No difficulties  Cognition Arousal/Alertness: Awake/alert Behavior During Therapy: WFL for tasks assessed/performed Overall Cognitive Status: Within Functional Limits for tasks assessed                                        General Comments      Exercises     Assessment/Plan    PT Assessment Patent does not need any further PT services  PT Problem List         PT Treatment Interventions      PT Goals (Current goals can be found in the Care Plan section)  Acute Rehab PT Goals PT Goal Formulation: All assessment and education complete, DC therapy    Frequency     Barriers to discharge        Co-evaluation  AM-PAC PT "6 Clicks" Daily Activity  Outcome Measure Difficulty turning over in bed (including adjusting bedclothes, sheets and blankets)?: None Difficulty moving from lying on back to sitting on the side of the bed? : None Difficulty sitting down on and standing up from a chair with arms (e.g., wheelchair, bedside commode, etc,.)?: None Help needed moving to and from a bed to chair (including a wheelchair)?: None Help needed walking in hospital room?: None Help needed climbing 3-5 steps with a railing? : None 6 Click Score: 24    End of Session   Activity Tolerance: Patient tolerated treatment well Patient left: in chair;with call bell/phone within reach   PT Visit Diagnosis: Other abnormalities of gait and mobility (R26.89)    Time: 7530-0511 PT Time Calculation (min) (ACUTE ONLY): 19  min   Charges:   PT Evaluation $PT Eval Low Complexity: 1 Low     PT G CodesMarland Kitchen        [facility] [place] 11/07/2017, 10:18 AM

## 2017-11-07 NOTE — Progress Notes (Addendum)
   Daily Progress Note   Assessment/Planning:   POD #1 s/p L CCA endarterectomy with BPA   No neck hematoma  Neuro intact   Advance diet   Will arrange follow up in 2 weeks  Would prefer pt be discharged on ECASA 81 mg PO daily minimally  Plaque morphology suggests severe anemia + flow restriction as the mechanism of watershed injury to L brain   Subjective  - 1 Day Post-Op   No complaints   Objective   Vitals:   11/06/17 1900 11/06/17 2130 11/06/17 2340 11/07/17 0419  BP: 113/70  123/74 111/64  Pulse: 76  70 80  Resp: 16  (!) 22 15  Temp: 98.6 F (37 C)  98.2 F (36.8 C) 98.1 F (36.7 C)  TempSrc: Oral  Oral Oral  SpO2: 97% 96% 99% 99%  Weight:      Height:         Intake/Output Summary (Last 24 hours) at 11/07/2017 0713 Last data filed at 11/07/2017 2458 Gross per 24 hour  Intake 3215 ml  Output 595 ml  Net 2620 ml    PULM  CTAB  CV  RRR  GI  soft, NTND  VASC Neck without hematoma, inc c/d/i,  NEURO Tongue midline, M/S 5/5 B, speech fluid    Laboratory   CBC CBC Latest Ref Rng & Units 11/07/2017 11/06/2017 11/05/2017  WBC 4.0 - 10.5 K/uL 10.6(H) 10.0 -  Hemoglobin 13.0 - 17.0 g/dL 8.6(L) 9.1(L) 9.6(L)  Hematocrit 39.0 - 52.0 % 27.2(L) 28.3(L) 30.1(L)  Platelets 150 - 400 K/uL 258 278 -    BMET    Component Value Date/Time   NA 139 11/07/2017 0556   NA 141 06/28/2017 1216   K 4.5 11/07/2017 0556   CL 112 (H) 11/07/2017 0556   CO2 20 (L) 11/07/2017 0556   GLUCOSE 133 (H) 11/07/2017 0556   BUN 6 11/07/2017 0556   BUN 6 (L) 06/28/2017 1216   CREATININE 0.97 11/07/2017 0556   CREATININE 1.27 (H) 11/28/2016 1235   CALCIUM 8.3 (L) 11/07/2017 0556   GFRNONAA >60 11/07/2017 0556   GFRNONAA 60 11/28/2016 1235   GFRAA >60 11/07/2017 0556   GFRAA 69 11/28/2016 1235     Adele Barthel, MD, FACS [facility]: [phone] Pager: [phone]  11/07/2017, 7:13 AM    --- For VQI Registry use  --- Instructions: Press F2 to tab through selections.  Delete question if not applicable.   Modified Rankin score at D/C (0-6): Rankin Score=0  IV medication needed for:  1. Hypertension: No 2. Hypotension: No  Post-op Complications: No  1. Post-op CVA or TIA: No  2. CN injury: No  3. Myocardial infarction: No  4.  CHF: No  5.  Dysrhythmia (new): No  6. Wound infection: No  7. Reperfusion symptoms: No  8. Return to OR: No   Discharge medications: Statin use:  Yes ASA use:  Yes Beta blocker use:  No  for medical reason not indicated ACE-Inhibitor use:  No  for medical reason Not indicated P2Y12 Antagonist use: [x ] None, [ ]  Plavix, [ ]  Plasugrel, [ ]  Ticlopinine, [ ]  Ticagrelor, [ ]  Other, [ ]  No for medical reason, [ ]  Non-compliant, [x ] Not-indicated Anti-coagulant use:  [x]  None, [ ]  Warfarin, [ ]  Rivaroxaban, [ ]  Dabigatran, [ ]  Other, [ ]  No for medical reason, [ ]  Non-compliant, [x]  Not-indicated

## 2017-11-07 NOTE — Plan of Care (Signed)
Pt continue to progress with treatment plan

## 2017-11-07 NOTE — Telephone Encounter (Signed)
sch appt lvm 11/22/17 215pm p/o MD s/p carotid endarterectomy per stf msg

## 2017-11-07 NOTE — Evaluation (Signed)
Occupational Therapy Evaluation Patient Details Name: [name] MRN: [id number] DOB: 02/27/1956 Today's Date: 11/07/2017    History of Present Illness Pt adm with severe anemia and sudden onset lt eye blindness. MRI showed multifocal subcentimeter watershed infarcts of lt hemisphere. Pt transfused 4 units and underwent EGD which showed no active bleeding. Pt underwent lt carotid endarterectomy on 5/27.  PMH - GIB, copd, Cabg, polysubstance abuse   Clinical Impression   PTA, pt was living alone and was independent. Pt presenting at Mod I level with increased time as needed for ADLs and functional mobility. When covering right eye, pt reports he is seeing some light ("it is like shadows") and movement through left eye. Educating pt on follow up with eye MD and reframe from driving until cleared by MD.  Recommend dc home once medically stable per physician. All acute OT needs met and will sign off.    Follow Up Recommendations  No OT follow up(Follow up with eye MD)    Equipment Recommendations  None recommended by OT    Recommendations for Other Services       Precautions / Restrictions Precautions Precautions: None Restrictions Weight Bearing Restrictions: No      Mobility Bed Mobility               General bed mobility comments: Pt up in chair  Transfers Overall transfer level: Independent Equipment used: None                  Balance Overall balance assessment: No apparent balance deficits (not formally assessed)                                         ADL either performed or assessed with clinical judgement   ADL Overall ADL's : Modified independent                                       General ADL Comments: Pt performing ADLs and functional mobility at a Mod I level with increased time. Discussed vision deficits and compensatory techniques. Educating pt to follow up with eye MD and to wait to drive until cleared  by MD. Educating pt on depth perception and safety.     Vision Patient Visual Report: Other (comment)(left eye blindness) Vision Assessment?: Yes Eye Alignment: Within Functional Limits Ocular Range of Motion: Within Functional Limits Alignment/Gaze Preference: Within Defined Limits Tracking/Visual Pursuits: Able to track stimulus in all quads without difficulty Saccades: Within functional limits Convergence: Impaired (comment) Depth Perception: Undershoots Additional Comments: Pt presenitng WFL for eye ROM and tracking. Left eye not converging. When covering right eye, pt reporting he sees shadows with left eye and can see movement. Slight undershoot during depth perception.      Perception     Praxis      Pertinent Vitals/Pain Pain Assessment: No/denies pain     Hand Dominance Right   Extremity/Trunk Assessment Upper Extremity Assessment Upper Extremity Assessment: Overall WFL for tasks assessed   Lower Extremity Assessment Lower Extremity Assessment: Defer to PT evaluation   Cervical / Trunk Assessment Cervical / Trunk Assessment: Normal   Communication Communication Communication: No difficulties   Cognition Arousal/Alertness: Awake/alert Behavior During Therapy: WFL for tasks assessed/performed Overall Cognitive Status: Within Functional Limits for tasks assessed  General Comments       Exercises     Shoulder Instructions      Home Living Family/patient expects to be discharged to:: Private residence Living Arrangements: Alone   Type of Home: House Home Access: Stairs to enter [employer] of Steps: 3-4 Entrance Stairs-Rails: Right Home Layout: One level     Bathroom Shower/Tub: Tub/shower unit;Walk-in shower   Bathroom Toilet: Standard     Home Equipment: Grab bars - toilet;Grab bars - tub/shower;Shower seat          Prior Functioning/Environment Level of Independence:  Independent        Comments: Retired, ADLs, IADLs, and driving        OT Problem List: Impaired balance (sitting and/or standing);Impaired vision/perception      OT Treatment/Interventions:      OT Goals(Current goals can be found in the care plan section) Acute Rehab OT Goals Patient Stated Goal: Go home OT Goal Formulation: All assessment and education complete, DC therapy  OT Frequency:     Barriers to D/C:            Co-evaluation              AM-PAC PT "6 Clicks" Daily Activity     Outcome Measure Help from another person eating meals?: None Help from another person taking care of personal grooming?: None Help from another person toileting, which includes using toliet, bedpan, or urinal?: None Help from another person bathing (including washing, rinsing, drying)?: None Help from another person to put on and taking off regular upper body clothing?: None Help from another person to put on and taking off regular lower body clothing?: None 6 Click Score: 24   End of Session Nurse Communication: Mobility status  Activity Tolerance: Patient tolerated treatment well Patient left: in chair;with call bell/phone within reach  OT Visit Diagnosis: Other abnormalities of gait and mobility (R26.89);Low vision, both eyes (H54.2)                Time: 1126-1140 OT Time Calculation (min): 14 min Charges:  OT General Charges $OT Visit: 1 Visit OT Evaluation $OT Eval Low Complexity: 1 Low G-Codes:     [name] MSOT, OTR/L Acute Rehab Pager: [phone] Office: [place] 11/07/2017, 2:28 PM

## 2017-11-07 NOTE — Telephone Encounter (Signed)
-----   Message from Mena Goes, RN sent at 11/07/2017 10:07 AM EDT ----- Regarding: 2 weeks postop CEA   ----- Message ----- From: Conrad St. Maries, MD Sent: 11/06/2017  12:23 PM To: Vvs Charge Pool  EARON RIVEST [id number] 10-30-1955   PROCEDURE:   1.  left common carotid endarterectomy with bovine patch angioplasty 2.  left intraoperative carotid ultrasound  Asst: Dagoberto Ligas, [facility]   Follow-up: 2 weeks

## 2017-11-07 NOTE — Progress Notes (Signed)
PROGRESS NOTE  MCCLAIN SHALL ALP:[id number] DOB: 1955-07-19 DOA: 11/04/2017 PCP: Dorena Dew, FNP   LOS: 3 days   Brief Narrative / Interim history: 62 y.o.malewith medical history significant ofCAD status post four-vessel CABG, COPD, history of GERD and history of GI bleed as well as perforation of duodenal ulcer, resents with rapid onset left eye blindness, work-up in ED significant for anemia with hemoglobin of 4.3, has significant left carotid artery disease, anoscopy with no evidence of active GI bleed, give 3 units PRBC, another unit pending, he was seen by neurology, vascular surgery and GI.  MRI showing evidence of acute CVA, has been seen by vascular surgery, he is s/p left PCA endarterectomy 5/27  Assessment & Plan: Active Problems:   PUD (peptic ulcer disease)   Tobacco abuse   Pure hypercholesterolemia   S/P CABG x 4   HTN (hypertension)   Cardiomyopathy, ischemic   Chronic obstructive pulmonary disease (HCC)   Cocaine abuse (HCC)   Tobacco dependence   Vision loss of left eye   GIB (gastrointestinal bleeding)   Hypokalemia   Carotid artery stenosis   Acute CVA resulting in left eye vision loss  -Presents with left eye vision loss over last 2 to 3 days, RI brain significant for multifocal sub-centimeters watershed infarcts of the left hemisphere, as well as evidence of high-grade stenosis of left CCA, most likely contributing to his acute CVA . -Patient was cleared for GI to start on blood thinners, initially on aspirin but now change to Plavix per neurology recommendations. -Neurology following  -ophthalmology Dr. Alanda Slim evaluated patient 5/26, no interventions needed, outpatient follow up -PT/OT - no follow up  Bilateral carotid artery stenosis -Vascular surgery input greatly appreciated, severe left CCA stenosis most likely the contributing factor of his acute CVA -Status post endarterectomy 5/27  Acute blood loss anemia/iron deficiency  anemia -Multifactorial, most likely blood loss anemia from GI bleed, he had endoscopy done by GI, no evidence of active bleed (part of proximal fundus could not be evaluated given residual food) -Hb 4.3 on presentation, is used 4 units PRBC, with good response, hemoglobin has remained stable, recheck in the morning -Low iron, ferritin,  has received ferehem -D53 and folic acid low as well, started on supplements . -D/W with GI, patient is clear to start on blood thinners, now on Plavix  Hypokalemia -repleted  Polysubstance Abuse including Cocaine and THC -Patient admitted to cocainelast week and marijuana the night PTA  Tobacco Abuse -Continue with nicotine patch 14 mg transdermally every 24 hours  EtOH Abuse/Concern for EtOH Withdrawal -Patient reports drinking couple nights a week, continue with thiamine and folic acid, on CIWA protocol, no evidence of withdrawals  HLD -LDL is 42 -Continue with atorvastatin 80 mg p.o. nightly  HTN -Pressure is soft  Hx of MI/CAD s/p CABG -Patient has had a quadruple bypass in 2015 and history of MI -Takes aspirin 325 mg p.o. daily as well as atorvastatin 80 mg nightly. Change to Plavix today -Resume aspirin tomorrow if hemoglobin stable  GERD  -Protonix drip  COPD -continue with home medications, no active wheezing,.    DVT prophylaxis: SCDs Code Status: Full code Family Communication: no family at bedside Disposition Plan: home when ready, 1 day   Consultants:   Neurology   Vascular surgery   GI  Ophthalmology   Procedures:   EGD No bleeding or blood products seen. Minimal Candida esophagitis. No ulcer seen (proximal stomach limited view due to food products). Change to  Protonix 40 mg IV Q 12 hours. Diet recs per neurology. No plans for additional GI procedures at this time (needs outpt colonoscopy). Anemia likely multifactorial and cocaine use likely contributed to it. Start Nystatin swish and swallow for the  Candida esophagitis when he is tolerating solid food   Left carotid endarterectomy   Antimicrobials:  None   Subjective: -Doing well, denies any headaches, no lightheadedness or dizziness, no abdominal pain, nausea or vomiting  Objective: Vitals:   11/07/17 0900 11/07/17 1000 11/07/17 1100 11/07/17 1116  BP:      Pulse:    63  Resp: 13 (!) 21 18 19   Temp:    (!) 97.5 F (36.4 C)  TempSrc:    Oral  SpO2: 100%  100%   Weight:      Height:        Intake/Output Summary (Last 24 hours) at 11/07/2017 1454 Last data filed at 11/07/2017 1300 Gross per 24 hour  Intake 3080 ml  Output 1075 ml  Net 2005 ml   Filed Weights   11/04/17 1211 11/05/17 0118 11/06/17 0400  Weight: 88.5 kg (195 lb) 84.7 kg (186 lb 11.7 oz) 86.1 kg (189 lb 12.8 oz)    Examination:  Constitutional: NAD Eyes: lids and conjunctivae normal ENMT: Mucous membranes are moist.  Neck: normal, supple.  Left neck surgical scar without bleeding/drainage Respiratory: clear to auscultation bilaterally, no wheezing, no crackles. Normal respiratory effort. No accessory muscle use.  Cardiovascular: Regular rate and rhythm, no murmurs / rubs / gallops. No LE edema. 2+ pedal pulses.  Abdomen: no tenderness. Bowel sounds positive.  Skin: no rashes, lesions, ulcers. No induration Neurologic: non focal    Data Reviewed: I have independently reviewed following labs and imaging studies   CBC: Recent Labs  Lab 11/04/17 1353 11/04/17 1502 11/05/17 0253 11/05/17 2237 11/06/17 0309 11/07/17 0556  WBC 6.6  --  7.1  --  10.0 10.6*  NEUTROABS 3.7  --   --   --   --   --   HGB 4.7* 4.3* 7.2* 9.6* 9.1* 8.6*  HCT 15.4* 14.4* 23.0* 30.1* 28.3* 27.2*  MCV 74.4*  --  79.6  --  78.4 79.5  PLT 339  --  305  --  278 408   Basic Metabolic Panel: Recent Labs  Lab 11/04/17 1353 11/05/17 0253 11/06/17 0309 11/07/17 0556  NA 136 135 138 139  K 2.9* 3.5 3.8 4.5  CL 101 104 111 112*  CO2 24 22 21* 20*  GLUCOSE 97 86 96  133*  BUN 11 7 5* 6  CREATININE 1.05 0.95 0.93 0.97  CALCIUM 8.5* 8.4* 8.2* 8.3*   GFR: Estimated Creatinine Clearance: 84.7 mL/min (by C-G formula based on SCr of 0.97 mg/dL). Liver Function Tests: Recent Labs  Lab 11/05/17 0253  AST 16  ALT 11*  ALKPHOS 113  BILITOT 2.0*  PROT 6.0*  ALBUMIN 3.2*   No results for input(s): LIPASE, AMYLASE in the last 168 hours. No results for input(s): AMMONIA in the last 168 hours. Coagulation Profile: Recent Labs  Lab 11/04/17 1353 11/05/17 0253  INR 0.93 1.01   Cardiac Enzymes: No results for input(s): CKTOTAL, CKMB, CKMBINDEX, TROPONINI in the last 168 hours. BNP (last 3 results) No results for input(s): PROBNP in the last 8760 hours. HbA1C: Recent Labs    11/05/17 0253  HGBA1C 5.2   CBG: Recent Labs  Lab 11/05/17 0620 11/06/17 0617 11/07/17 0553  GLUCAP 93 96 130*   Lipid Profile:  Recent Labs    11/05/17 0253  CHOL 97  HDL 44  LDLCALC 42  TRIG 57  CHOLHDL 2.2   Thyroid Function Tests: Recent Labs    11/05/17 0253  TSH 0.478   Anemia Panel: Recent Labs    11/04/17 1549  VITAMINB12 274  FOLATE 6.6  FERRITIN 4*  TIBC 479*  IRON 5*  RETICCTPCT 2.2   Urine analysis:    Component Value Date/Time   COLORURINE YELLOW 11/04/2017 [place] 11/04/2017 1652   LABSPEC 1.023 11/04/2017 1652   PHURINE 7.0 11/04/2017 1652   GLUCOSEU NEGATIVE 11/04/2017 1652   HGBUR NEGATIVE 11/04/2017 1652   BILIRUBINUR NEGATIVE 11/04/2017 1652   KETONESUR NEGATIVE 11/04/2017 1652   PROTEINUR NEGATIVE 11/04/2017 1652   UROBILINOGEN 0.2 06/28/2017 1152   NITRITE NEGATIVE 11/04/2017 1652   LEUKOCYTESUR NEGATIVE 11/04/2017 1652   Sepsis Labs: Invalid input(s): PROCALCITONIN, LACTICIDVEN  Recent Results (from the past 240 hour(s))  Urine culture     Status: None   Collection Time: 11/04/17 11:46 PM  Result Value Ref Range Status   Specimen Description   Final    URINE, CLEAN CATCH Performed at [facility], [place] [address], [place], [place] [address]    Special Requests   Final    NONE Performed at [facility], [place] [address] [place], [place] [address]    Culture   Final    NO GROWTH Performed at [facility], [place] [address], [place], [place] [address]    Report Status 11/06/2017 FINAL  Final  MRSA PCR Screening     Status: Abnormal   Collection Time: 11/05/17  2:55 AM  Result Value Ref Range Status   MRSA by PCR INVALID RESULTS, SPECIMEN SENT FOR CULTURE (A) NEGATIVE Final    Comment:        The GeneXpert MRSA Assay (FDA approved for NASAL specimens only), is one component of a comprehensive MRSA colonization surveillance program. It is not intended to diagnose MRSA infection nor to guide or monitor treatment for MRSA infections.   MRSA culture     Status: None   Collection Time: 11/05/17  5:45 AM  Result Value Ref Range Status   Specimen Description NASAL SWAB  Final   Special Requests NONE  Final   Culture   Final    NO MRSA DETECTED Performed at [facility], [address] [place], [place] [address]    Report Status 11/06/2017 FINAL  Final  MRSA PCR Screening     Status: None   Collection Time: 11/05/17  6:10 PM  Result Value Ref Range Status   MRSA by PCR NEGATIVE NEGATIVE Final    Comment:        The GeneXpert MRSA Assay (FDA approved for NASAL specimens only), is one component of a comprehensive MRSA colonization surveillance program. It is not intended to diagnose MRSA infection nor to guide or monitor treatment for MRSA infections. Performed at [facility], [place] [address] [place], [place] [address]       Radiology Studies: Mr Jeri Cos JH Contrast  Result Date: 11/05/2017 CLINICAL DATA:  LEFT eye vision loss.  Stroke suspected. EXAM: MRI HEAD AND ORBITS WITHOUT AND WITH CONTRAST TECHNIQUE: Multiplanar, multiecho pulse sequences of the brain and surrounding structures were obtained without  and with intravenous contrast. Multiplanar, multiecho pulse sequences of the orbits and surrounding structures were obtained including fat saturation techniques, before and after intravenous contrast  administration. CONTRAST:  76mL MULTIHANCE GADOBENATE DIMEGLUMINE 529 MG/ML IV SOLN COMPARISON:  CTA head neck 11/04/2017. FINDINGS: MRI HEAD FINDINGS Brain: Multiple small foci of restricted diffusion are seen in the LEFT hemisphere, affecting the subcortical and periventricular deep white matter largely sparing the cortex, consistent with multiple watershed infarcts. No hemorrhage, mass lesion, hydrocephalus, or extra-axial fluid. Generalized atrophy, premature for age. Moderate T2 and FLAIR hyperintensities in the white matter, consistent with small vessel disease. Remote LEFT periventricular infarct near the caudate. Post infusion, no abnormal enhancement of the brain or meninges. Vascular: Normal flow voids. Skull and upper cervical spine: Normal marrow signal. Other: None. MRI ORBITS FINDINGS Orbits: No traumatic or inflammatory finding. Globes, optic nerves, orbital fat, extraocular muscles, vascular structures, and lacrimal glands are normal. Visualized sinuses: Clear. Soft tissues: Negative. Limited intracranial: Reported separately. IMPRESSION: MRI brain demonstrating multifocal subcentimeter watershed infarcts of the LEFT hemisphere. High-grade stenosis of the LEFT CCA is likely contributory; multiple emboli are possible, less favored. Atrophy and small vessel disease. Negative MRI of the orbits. Electronically Signed   By: Staci Righter M.D.   On: 11/05/2017 17:24   Mr [name] PO Contrast  Result Date: 11/05/2017 CLINICAL DATA:  LEFT eye vision loss.  Stroke suspected. EXAM: MRI HEAD AND ORBITS WITHOUT AND WITH CONTRAST TECHNIQUE: Multiplanar, multiecho pulse sequences of the brain and surrounding structures were obtained without and with intravenous contrast. Multiplanar, multiecho pulse sequences of  the orbits and surrounding structures were obtained including fat saturation techniques, before and after intravenous contrast administration. CONTRAST:  70mL MULTIHANCE GADOBENATE DIMEGLUMINE 529 MG/ML IV SOLN COMPARISON:  CTA head neck 11/04/2017. FINDINGS: MRI HEAD FINDINGS Brain: Multiple small foci of restricted diffusion are seen in the LEFT hemisphere, affecting the subcortical and periventricular deep white matter largely sparing the cortex, consistent with multiple watershed infarcts. No hemorrhage, mass lesion, hydrocephalus, or extra-axial fluid. Generalized atrophy, premature for age. Moderate T2 and FLAIR hyperintensities in the white matter, consistent with small vessel disease. Remote LEFT periventricular infarct near the caudate. Post infusion, no abnormal enhancement of the brain or meninges. Vascular: Normal flow voids. Skull and upper cervical spine: Normal marrow signal. Other: None. MRI ORBITS FINDINGS Orbits: No traumatic or inflammatory finding. Globes, optic nerves, orbital fat, extraocular muscles, vascular structures, and lacrimal glands are normal. Visualized sinuses: Clear. Soft tissues: Negative. Limited intracranial: Reported separately. IMPRESSION: MRI brain demonstrating multifocal subcentimeter watershed infarcts of the LEFT hemisphere. High-grade stenosis of the LEFT CCA is likely contributory; multiple emboli are possible, less favored. Atrophy and small vessel disease. Negative MRI of the orbits. Electronically Signed   By: Staci Righter M.D.   On: 11/05/2017 17:24     Scheduled Meds: . atorvastatin  80 mg Oral q1800  . clopidogrel  75 mg Oral Daily  . cyanocobalamin  1,000 mcg Intramuscular Daily  . docusate sodium  100 mg Oral Daily  . enoxaparin (LOVENOX) injection  40 mg Subcutaneous Q24H  . fluticasone furoate-vilanterol  1 puff Inhalation Daily  . folic acid  1 mg Oral Daily  . multivitamin with minerals  1 tablet Oral Daily  . nicotine  14 mg Transdermal Daily   . [START ON 11/08/2017] pantoprazole  40 mg Intravenous Q12H  . thiamine  100 mg Oral Daily   Continuous Infusions: . sodium chloride    . sodium chloride    . 0.9 % NaCl with KCl 40 mEq / L 75 mL/hr at 11/07/17 1300  . magnesium sulfate 1 - 4 g bolus  IVPB    . pantoprozole (PROTONIX) infusion 8 mg/hr (11/07/17 1300)    Marzetta Board, MD, PhD [facility] Pager [phone]  If 7PM-7AM, please contact night-coverage [email or web address] Password [facility] 11/07/2017, 2:54 PM

## 2017-11-08 ENCOUNTER — Inpatient Hospital Stay (HOSPITAL_COMMUNITY): Payer: Self-pay

## 2017-11-08 DIAGNOSIS — I361 Nonrheumatic tricuspid (valve) insufficiency: Secondary | ICD-10-CM

## 2017-11-08 LAB — BASIC METABOLIC PANEL
Anion gap: 12 (ref 5–15)
BUN: 5 mg/dL — AB (ref 6–20)
CO2: 21 mmol/L — ABNORMAL LOW (ref 22–32)
CREATININE: 1.07 mg/dL (ref 0.61–1.24)
Calcium: 8.6 mg/dL — ABNORMAL LOW (ref 8.9–10.3)
Chloride: 111 mmol/L (ref 101–111)
Glucose, Bld: 79 mg/dL (ref 65–99)
POTASSIUM: 5.2 mmol/L — AB (ref 3.5–5.1)
SODIUM: 144 mmol/L (ref 135–145)

## 2017-11-08 LAB — ECHOCARDIOGRAM COMPLETE
Height: 72.1 in
Weight: 3224.01 oz

## 2017-11-08 LAB — CBC
HEMATOCRIT: 28.4 % — AB (ref 39.0–52.0)
Hemoglobin: 8.6 g/dL — ABNORMAL LOW (ref 13.0–17.0)
MCH: 25.3 pg — ABNORMAL LOW (ref 26.0–34.0)
MCHC: 30.3 g/dL (ref 30.0–36.0)
MCV: 83.5 fL (ref 78.0–100.0)
Platelets: 223 10*3/uL (ref 150–400)
RBC: 3.4 MIL/uL — ABNORMAL LOW (ref 4.22–5.81)
RDW: 21.9 % — ABNORMAL HIGH (ref 11.5–15.5)
WBC: 9.8 10*3/uL (ref 4.0–10.5)

## 2017-11-08 LAB — GLUCOSE, CAPILLARY: Glucose-Capillary: 173 mg/dL — ABNORMAL HIGH (ref 65–99)

## 2017-11-08 MED ORDER — ADULT MULTIVITAMIN W/MINERALS CH: 1.0000 | ORAL_TABLET | Freq: Every day | ORAL | 1 refills | Status: AC

## 2017-11-08 MED ORDER — NICOTINE 14 MG/24HR TD PT24: 14.0000 mg | MEDICATED_PATCH | Freq: Every day | TRANSDERMAL | 0 refills | Status: DC

## 2017-11-08 MED ORDER — CLOPIDOGREL BISULFATE 75 MG PO TABS: 75.0000 mg | ORAL_TABLET | Freq: Every day | ORAL | 1 refills | Status: DC

## 2017-11-08 MED ORDER — PANTOPRAZOLE SODIUM 40 MG PO TBEC: 40.0000 mg | DELAYED_RELEASE_TABLET | Freq: Every day | ORAL | 1 refills | Status: DC

## 2017-11-08 MED ORDER — ATORVASTATIN CALCIUM 80 MG PO TABS: 80.0000 mg | ORAL_TABLET | Freq: Every day | ORAL | 1 refills | Status: DC

## 2017-11-08 NOTE — Plan of Care (Signed)
  Problem: Education: Goal: Knowledge of General Education information will improve Outcome: Progressing   Problem: Health Behavior/Discharge Planning: Goal: Ability to manage health-related needs will improve Outcome: Progressing   

## 2017-11-08 NOTE — Discharge Summary (Signed)
Physician Discharge Summary  [name] WJX:[id number] DOB: 11/05/1955 DOA: 11/04/2017  PCP: Dorena Dew, FNP  Admit date: 11/04/2017 Discharge date: 11/08/2017  Admitted From: home Disposition:  home  Recommendations for Outpatient Follow-up:  1. Follow up with PCP in 1-2 weeks 2. Follow up with Dr. Bridgett Larsson in 2 weeks 3. Follow up with Dr. Leonie Man in 6 weeks  Home Health: none Equipment/Devices: none  Discharge Condition: stable CODE STATUS: Full code Diet recommendation: heart healthy  HPI: Per Dr. Alfredia Ferguson, [name] is a 62 y.o. male with medical history significant of CAD status post four-vessel CABG, COPD, history of GERD and history of GI bleed as well as perforation of duodenal ulcer, Tobacco abuse, history of polysubstance abuse including cocaine, hypertension, hyperlipidemia, history of eczema and other comorbidities who presented to [facility] emergency room with a chief complaint of left eye vision loss.  Patient states that about 2 to 3 days ago he lost vision in his eye however states that it got worse today and was not able to see properly and described it as if everything was "black".  Patient also endorsed abdominal pain and shortness of breath have been going 2 to 3 weeks.  States that he felt like he was having another ulcer which it felt similar to in the past.  Endorsed coffee-ground emesis as well as abdominal pain and dark black stools for 2 to 3 weeks however they have stopped.  Patient states that he continues to drink alcohol, smoke tobacco, as well as do cocaine and marijuana.  Denies any other complaints of concern except he states that he had some nausea and poor appetite after he is abdomen started hurting.  Denied chest pain but does endorse weakness, shortness of breath especially with ambulation, lightheadedness and occasional dizziness.  No burning or discomfort in urine.  [facility] was called to admit this patient for left eye vision loss as well as  suspected upper GI bleed. ED Course: Had basic blood work done as well as CTA of the head and neck.  Neurology was called and patient was given potassium of treatment.  Patient to be transferred to [facility] for further evaluation.  Hospital Course: Acute CVA resulting in left eye vision loss-Presents with left eye vision loss over last 2 to 3 days, MRI brain significant for multifocal sub-centimeters watershed infarcts of the left hemisphere, as well as evidence of high-grade stenosis of left CCA, most likely contributing to his acute CVA. Neurology was consulted and followed patient while hospitalized. He was placed on Plavix. Ophthalmology Dr. Alanda Slim evaluated patient 5/26, no interventions needed, outpatient follow up. LDL 42. A1C 5.2. Continue statin Bilateral carotid artery stenosis -Vascular surgery input greatly appreciated, severe left CCA stenosis most likely the contributing factor of his acute CVA. He is status post endarterectomy 5/27. Outpatient follow up with [facility] in 2 weeks Acute blood loss anemia/iron deficiency anemia -Multifactorial, most likely blood loss anemia from GI bleed, he had endoscopy done by GI, no evidence of active bleed (part of proximal fundus could not be evaluated given residual food). Hb4.3 on presentation, has been transfused 4 units PRBC, with good response, hemoglobin has remained stable and had no further bleeding. Patient is clear to start on blood thinners, now on Plavix Polysubstance Abuse including Cocaine and THC -Patient admitted to cocainelast week and marijuana the night PTA, counseled Tobacco Abuse -Continue with nicotine patch 14 mg transdermally every 24 hours EtOH Abuse/Concern for EtOH Withdrawal -Patient reports drinking  couple nights a week, continue with thiamine and folic acid, on CIWA protocol, no evidence of withdrawals HLD -continue with statin  HTN -on low dose HCTZ Hx of MI/CAD s/p CABG -Patient has had a quadruple bypass in 2015 and  history of MI, continue plavix and statin GERD -continue protonix on d/c COPD -continue with home medications, no active wheezing,.    Discharge Diagnoses:  Active Problems:   PUD (peptic ulcer disease)   Tobacco abuse   Pure hypercholesterolemia   S/P CABG x 4   HTN (hypertension)   Cardiomyopathy, ischemic   Chronic obstructive pulmonary disease (HCC)   Cocaine abuse (HCC)   Tobacco dependence   Vision loss of left eye   GIB (gastrointestinal bleeding)   Hypokalemia   Carotid artery stenosis     Discharge Instructions   Allergies as of 11/08/2017   No Known Allergies     Medication List    STOP taking these medications   aspirin 325 MG EC tablet   budesonide-formoterol 160-4.5 MCG/ACT inhaler Commonly known as:  SYMBICORT   SYMBICORT 160-4.5 MCG/ACT inhaler Generic drug:  budesonide-formoterol     TAKE these medications   atorvastatin 80 MG tablet Commonly known as:  LIPITOR Take 1 tablet (80 mg total) by mouth daily at 6 PM. What changed:  See the new instructions.   clopidogrel 75 MG tablet Commonly known as:  PLAVIX Take 1 tablet (75 mg total) by mouth daily. Start taking on:  11/09/2017   fluticasone 50 MCG/ACT nasal spray Commonly known as:  FLONASE Place 2 sprays into both nostrils as needed for allergies or rhinitis.   fluticasone furoate-vilanterol 200-25 MCG/INH Aepb Commonly known as:  BREO ELLIPTA Inhale 1 puff into the lungs daily.   hydrochlorothiazide 12.5 MG capsule Commonly known as:  MICROZIDE TAKE 1 CAPSULE BY MOUTH DAILY. What changed:  Another medication with the same name was removed. Continue taking this medication, and follow the directions you see here.   multivitamin with minerals Tabs tablet Take 1 tablet by mouth daily. Start taking on:  11/09/2017   nicotine 14 mg/24hr patch Commonly known as:  NICODERM CQ Place 1 patch (14 mg total) onto the skin daily.   pantoprazole 40 MG tablet Commonly known as:   PROTONIX Take 1 tablet (40 mg total) by mouth daily.   triamcinolone 0.025 % ointment Commonly known as:  KENALOG APPLY 1 APPLICATION TOPICALLY 2 TIMES DAILY. What changed:  See the new instructions.   VENTOLIN HFA 108 (90 Base) MCG/ACT inhaler Generic drug:  albuterol INHALE 2 PUFFS INTO THE LUNGS EVERY 6 HOURS AS NEEDED FOR WHEEZING OR SHORTNESS OF BREATH. What changed:  Another medication with the same name was removed. Continue taking this medication, and follow the directions you see here.      Follow-up Information    Garvin Fila, MD. Schedule an appointment as soon as possible for a visit in 6 week(s).   Specialties:  Neurology, Radiology Contact information: [address] [place] [address] [phone]        Conrad Duque, MD. Schedule an appointment as soon as possible for a visit in 2 week(s).   Specialties:  Vascular Surgery, Cardiology Contact information: [address] [place] [address] [phone]           Consultations:  Neurology   Vascular surgery   Procedures/Studies:  2D echo  Impressions:  - LVEF 60-65%, mild LVH, normal wall motion, normal diastolic function, normal LA size, mild  TR, RVSP 47 mmHg, dilated IVC.  Ct Angio Head W Or Wo Contrast  Result Date: 11/04/2017 CLINICAL DATA:  New onset of vision loss in the left eye. The patient has no perception of light in the left eye. He began to have diminished vision in the left eye last evening. He awoke with no vision in the left eye today. EXAM: CT ANGIOGRAPHY HEAD AND NECK TECHNIQUE: Multidetector CT imaging of the head and neck was performed using the standard protocol during bolus administration of intravenous contrast. Multiplanar CT image reconstructions and MIPs were obtained to evaluate the vascular anatomy. Carotid stenosis measurements (when applicable) are obtained utilizing NASCET criteria, using the distal internal carotid diameter as the denominator.  CONTRAST:  171mL ISOVUE-370 IOPAMIDOL (ISOVUE-370) INJECTION 76% COMPARISON:  None. FINDINGS: CT HEAD FINDINGS Brain: Noncontrast imaging the brain demonstrates scattered periventricular white matter hypoattenuation bilaterally. In age indeterminate lacunar infarct is present within the anterior limb of the left internal capsule. A ridging is white matter hypoattenuation is present in the inferior cerebellum bilaterally. No acute hemorrhage or mass lesion is present. The ventricles are of normal size. No significant extra-axial fluid collection is present. Vascular: No hyperdense vessel or unexpected calcification. Skull: Calvarium is intact. No focal lytic or blastic lesions are present. Sinuses: The paranasal sinuses and mastoid air cells are clear. Orbits: Bilateral globes and orbits are within normal limits. Review of the MIP images confirms the above findings CTA NECK FINDINGS Aortic arch: A 3 vessel arch configuration is present. Atherosclerotic calcifications are present without significant stenosis at the origins the great vessels. There is no aneurysm. Right carotid system: The right common carotid artery is within normal limits. Mild tortuosity is present in the proximal cervical right ICA. There is focal calcification along the posterior wall of the distal right ICA. No stenosis is present. Left carotid system: The left common carotid artery is within normal limits proximally. There is focal stenosis of the mid left common carotid artery. The lumen is narrowed to 1.6 mm. This compares with a more distal segment of 6.5 mm. Soft tissue plaque is noted along the posterior aspect of the left carotid bulb without significant luminal stenosis. Focal calcification is present along the posterior wall of the distal left internal carotid artery just below the skull base without significant stenosis. Vertebral arteries: The vertebral arteries are codominant. Both vertebral arteries originate from the subclavian  arteries without significant stenoses. There is no significant stenosis of either vertebral artery in the neck. Skeleton: Focal endplate changes are present with uncovertebral spurring at C6-7. Bilateral osseous foraminal narrowing is worse on the right. Vertebral body heights and alignment are otherwise normal. The patient is status post median sternotomy. An unerupted right maxillary and I tooth is present. An adjacent hypoplastic on are up to tooth is present. The patient is otherwise edentulous along the maxilla. Other neck: Soft tissue lipoma is present along the left occipital scalp. The soft tissues of the neck are unremarkable otherwise. Thyroid is within normal limits. Salivary glands are unremarkable. No significant adenopathy is present. Upper chest: Centrilobular emphysematous changes are present. No focal nodule or mass lesion is present. No significant pleural effusion is present. Review of the MIP images confirms the above findings CTA HEAD FINDINGS Anterior circulation: The internal carotid arteries are within normal limits bilaterally from the skull base through the ICA termini. The right A1 segment is dominant. The anterior communicating artery is patent. MCA bifurcation is normal bilaterally. The ACA and MCA branch vessels  are within normal limits. Posterior circulation: The vertebral arteries are codominant. Vertebrobasilar junction is normal. PICA origins are visualized and within normal limits. Both posterior cerebral arteries originate from the basilar tip. There is mild irregularity of distal branch vessels without a significant proximal stenosis or occlusion. Venous sinuses: The dural sinuses are patent. Right transverse sinus is dominant. The straight sinus and deep cerebral veins are intact. Cortical veins are unremarkable. Anatomic variants: None Delayed phase: Post-contrast images demonstrate no pathologic enhancement. Review of the MIP images confirms the above findings IMPRESSION: 1.  High-grade stenosis of the mid left common carotid artery measuring greater than 75% relative to the more distal vessels. 2. Additional soft tissue plaque in the posterior wall of the proximal left internal carotid artery without focal stenosis. 3. Focal calcification in the posterior wall of the distal internal carotid artery bilaterally just below the skull base. This could be related to remote trauma. There is no lumen compromise associated. 4. No focal disease at the ophthalmic segment on the left. 5. Scattered white matter hypoattenuation bilaterally likely reflects the sequela of chronic microvascular ischemia. 6. No acute cortical infarct. These results were called by telephone at the time of interpretation on 11/04/2017 at 3:58 pm to [facility], [place] , who verbally acknowledged these results. Electronically Signed   By: San Morelle M.D.   On: 11/04/2017 16:02   Ct Head Wo Contrast  Result Date: 11/04/2017 CLINICAL DATA:  New onset of vision loss in the left eye. The patient has no perception of light in the left eye. He began to have diminished vision in the left eye last evening. He awoke with no vision in the left eye today. EXAM: CT ANGIOGRAPHY HEAD AND NECK TECHNIQUE: Multidetector CT imaging of the head and neck was performed using the standard protocol during bolus administration of intravenous contrast. Multiplanar CT image reconstructions and MIPs were obtained to evaluate the vascular anatomy. Carotid stenosis measurements (when applicable) are obtained utilizing NASCET criteria, using the distal internal carotid diameter as the denominator. CONTRAST:  156mL ISOVUE-370 IOPAMIDOL (ISOVUE-370) INJECTION 76% COMPARISON:  None. FINDINGS: CT HEAD FINDINGS Brain: Noncontrast imaging the brain demonstrates scattered periventricular white matter hypoattenuation bilaterally. In age indeterminate lacunar infarct is present within the anterior limb of the left internal capsule. A ridging is  white matter hypoattenuation is present in the inferior cerebellum bilaterally. No acute hemorrhage or mass lesion is present. The ventricles are of normal size. No significant extra-axial fluid collection is present. Vascular: No hyperdense vessel or unexpected calcification. Skull: Calvarium is intact. No focal lytic or blastic lesions are present. Sinuses: The paranasal sinuses and mastoid air cells are clear. Orbits: Bilateral globes and orbits are within normal limits. Review of the MIP images confirms the above findings CTA NECK FINDINGS Aortic arch: A 3 vessel arch configuration is present. Atherosclerotic calcifications are present without significant stenosis at the origins the great vessels. There is no aneurysm. Right carotid system: The right common carotid artery is within normal limits. Mild tortuosity is present in the proximal cervical right ICA. There is focal calcification along the posterior wall of the distal right ICA. No stenosis is present. Left carotid system: The left common carotid artery is within normal limits proximally. There is focal stenosis of the mid left common carotid artery. The lumen is narrowed to 1.6 mm. This compares with a more distal segment of 6.5 mm. Soft tissue plaque is noted along the posterior aspect of the left carotid bulb without significant luminal  stenosis. Focal calcification is present along the posterior wall of the distal left internal carotid artery just below the skull base without significant stenosis. Vertebral arteries: The vertebral arteries are codominant. Both vertebral arteries originate from the subclavian arteries without significant stenoses. There is no significant stenosis of either vertebral artery in the neck. Skeleton: Focal endplate changes are present with uncovertebral spurring at C6-7. Bilateral osseous foraminal narrowing is worse on the right. Vertebral body heights and alignment are otherwise normal. The patient is status post median  sternotomy. An unerupted right maxillary and I tooth is present. An adjacent hypoplastic on are up to tooth is present. The patient is otherwise edentulous along the maxilla. Other neck: Soft tissue lipoma is present along the left occipital scalp. The soft tissues of the neck are unremarkable otherwise. Thyroid is within normal limits. Salivary glands are unremarkable. No significant adenopathy is present. Upper chest: Centrilobular emphysematous changes are present. No focal nodule or mass lesion is present. No significant pleural effusion is present. Review of the MIP images confirms the above findings CTA HEAD FINDINGS Anterior circulation: The internal carotid arteries are within normal limits bilaterally from the skull base through the ICA termini. The right A1 segment is dominant. The anterior communicating artery is patent. MCA bifurcation is normal bilaterally. The ACA and MCA branch vessels are within normal limits. Posterior circulation: The vertebral arteries are codominant. Vertebrobasilar junction is normal. PICA origins are visualized and within normal limits. Both posterior cerebral arteries originate from the basilar tip. There is mild irregularity of distal branch vessels without a significant proximal stenosis or occlusion. Venous sinuses: The dural sinuses are patent. Right transverse sinus is dominant. The straight sinus and deep cerebral veins are intact. Cortical veins are unremarkable. Anatomic variants: None Delayed phase: Post-contrast images demonstrate no pathologic enhancement. Review of the MIP images confirms the above findings IMPRESSION: 1. High-grade stenosis of the mid left common carotid artery measuring greater than 75% relative to the more distal vessels. 2. Additional soft tissue plaque in the posterior wall of the proximal left internal carotid artery without focal stenosis. 3. Focal calcification in the posterior wall of the distal internal carotid artery bilaterally just  below the skull base. This could be related to remote trauma. There is no lumen compromise associated. 4. No focal disease at the ophthalmic segment on the left. 5. Scattered white matter hypoattenuation bilaterally likely reflects the sequela of chronic microvascular ischemia. 6. No acute cortical infarct. These results were called by telephone at the time of interpretation on 11/04/2017 at 3:58 pm to [facility], [place] , who verbally acknowledged these results. Electronically Signed   By: San Morelle M.D.   On: 11/04/2017 16:02   Ct Angio Neck W And/or Wo Contrast  Result Date: 11/04/2017 CLINICAL DATA:  New onset of vision loss in the left eye. The patient has no perception of light in the left eye. He began to have diminished vision in the left eye last evening. He awoke with no vision in the left eye today. EXAM: CT ANGIOGRAPHY HEAD AND NECK TECHNIQUE: Multidetector CT imaging of the head and neck was performed using the standard protocol during bolus administration of intravenous contrast. Multiplanar CT image reconstructions and MIPs were obtained to evaluate the vascular anatomy. Carotid stenosis measurements (when applicable) are obtained utilizing NASCET criteria, using the distal internal carotid diameter as the denominator. CONTRAST:  147mL ISOVUE-370 IOPAMIDOL (ISOVUE-370) INJECTION 76% COMPARISON:  None. FINDINGS: CT HEAD FINDINGS Brain: Noncontrast imaging the brain demonstrates  scattered periventricular white matter hypoattenuation bilaterally. In age indeterminate lacunar infarct is present within the anterior limb of the left internal capsule. A ridging is white matter hypoattenuation is present in the inferior cerebellum bilaterally. No acute hemorrhage or mass lesion is present. The ventricles are of normal size. No significant extra-axial fluid collection is present. Vascular: No hyperdense vessel or unexpected calcification. Skull: Calvarium is intact. No focal lytic or blastic  lesions are present. Sinuses: The paranasal sinuses and mastoid air cells are clear. Orbits: Bilateral globes and orbits are within normal limits. Review of the MIP images confirms the above findings CTA NECK FINDINGS Aortic arch: A 3 vessel arch configuration is present. Atherosclerotic calcifications are present without significant stenosis at the origins the great vessels. There is no aneurysm. Right carotid system: The right common carotid artery is within normal limits. Mild tortuosity is present in the proximal cervical right ICA. There is focal calcification along the posterior wall of the distal right ICA. No stenosis is present. Left carotid system: The left common carotid artery is within normal limits proximally. There is focal stenosis of the mid left common carotid artery. The lumen is narrowed to 1.6 mm. This compares with a more distal segment of 6.5 mm. Soft tissue plaque is noted along the posterior aspect of the left carotid bulb without significant luminal stenosis. Focal calcification is present along the posterior wall of the distal left internal carotid artery just below the skull base without significant stenosis. Vertebral arteries: The vertebral arteries are codominant. Both vertebral arteries originate from the subclavian arteries without significant stenoses. There is no significant stenosis of either vertebral artery in the neck. Skeleton: Focal endplate changes are present with uncovertebral spurring at C6-7. Bilateral osseous foraminal narrowing is worse on the right. Vertebral body heights and alignment are otherwise normal. The patient is status post median sternotomy. An unerupted right maxillary and I tooth is present. An adjacent hypoplastic on are up to tooth is present. The patient is otherwise edentulous along the maxilla. Other neck: Soft tissue lipoma is present along the left occipital scalp. The soft tissues of the neck are unremarkable otherwise. Thyroid is within normal  limits. Salivary glands are unremarkable. No significant adenopathy is present. Upper chest: Centrilobular emphysematous changes are present. No focal nodule or mass lesion is present. No significant pleural effusion is present. Review of the MIP images confirms the above findings CTA HEAD FINDINGS Anterior circulation: The internal carotid arteries are within normal limits bilaterally from the skull base through the ICA termini. The right A1 segment is dominant. The anterior communicating artery is patent. MCA bifurcation is normal bilaterally. The ACA and MCA branch vessels are within normal limits. Posterior circulation: The vertebral arteries are codominant. Vertebrobasilar junction is normal. PICA origins are visualized and within normal limits. Both posterior cerebral arteries originate from the basilar tip. There is mild irregularity of distal branch vessels without a significant proximal stenosis or occlusion. Venous sinuses: The dural sinuses are patent. Right transverse sinus is dominant. The straight sinus and deep cerebral veins are intact. Cortical veins are unremarkable. Anatomic variants: None Delayed phase: Post-contrast images demonstrate no pathologic enhancement. Review of the MIP images confirms the above findings IMPRESSION: 1. High-grade stenosis of the mid left common carotid artery measuring greater than 75% relative to the more distal vessels. 2. Additional soft tissue plaque in the posterior wall of the proximal left internal carotid artery without focal stenosis. 3. Focal calcification in the posterior wall of the distal internal  carotid artery bilaterally just below the skull base. This could be related to remote trauma. There is no lumen compromise associated. 4. No focal disease at the ophthalmic segment on the left. 5. Scattered white matter hypoattenuation bilaterally likely reflects the sequela of chronic microvascular ischemia. 6. No acute cortical infarct. These results were called  by telephone at the time of interpretation on 11/04/2017 at 3:58 pm to [facility], [place] , who verbally acknowledged these results. Electronically Signed   By: San Morelle M.D.   On: 11/04/2017 16:02   Mr Jeri Cos XB Contrast  Result Date: 11/05/2017 CLINICAL DATA:  LEFT eye vision loss.  Stroke suspected. EXAM: MRI HEAD AND ORBITS WITHOUT AND WITH CONTRAST TECHNIQUE: Multiplanar, multiecho pulse sequences of the brain and surrounding structures were obtained without and with intravenous contrast. Multiplanar, multiecho pulse sequences of the orbits and surrounding structures were obtained including fat saturation techniques, before and after intravenous contrast administration. CONTRAST:  10mL MULTIHANCE GADOBENATE DIMEGLUMINE 529 MG/ML IV SOLN COMPARISON:  CTA head neck 11/04/2017. FINDINGS: MRI HEAD FINDINGS Brain: Multiple small foci of restricted diffusion are seen in the LEFT hemisphere, affecting the subcortical and periventricular deep white matter largely sparing the cortex, consistent with multiple watershed infarcts. No hemorrhage, mass lesion, hydrocephalus, or extra-axial fluid. Generalized atrophy, premature for age. Moderate T2 and FLAIR hyperintensities in the white matter, consistent with small vessel disease. Remote LEFT periventricular infarct near the caudate. Post infusion, no abnormal enhancement of the brain or meninges. Vascular: Normal flow voids. Skull and upper cervical spine: Normal marrow signal. Other: None. MRI ORBITS FINDINGS Orbits: No traumatic or inflammatory finding. Globes, optic nerves, orbital fat, extraocular muscles, vascular structures, and lacrimal glands are normal. Visualized sinuses: Clear. Soft tissues: Negative. Limited intracranial: Reported separately. IMPRESSION: MRI brain demonstrating multifocal subcentimeter watershed infarcts of the LEFT hemisphere. High-grade stenosis of the LEFT CCA is likely contributory; multiple emboli are possible, less  favored. Atrophy and small vessel disease. Negative MRI of the orbits. Electronically Signed   By: Staci Righter M.D.   On: 11/05/2017 17:24   Mr Rosealee Albee JY Contrast  Result Date: 11/05/2017 CLINICAL DATA:  LEFT eye vision loss.  Stroke suspected. EXAM: MRI HEAD AND ORBITS WITHOUT AND WITH CONTRAST TECHNIQUE: Multiplanar, multiecho pulse sequences of the brain and surrounding structures were obtained without and with intravenous contrast. Multiplanar, multiecho pulse sequences of the orbits and surrounding structures were obtained including fat saturation techniques, before and after intravenous contrast administration. CONTRAST:  85mL MULTIHANCE GADOBENATE DIMEGLUMINE 529 MG/ML IV SOLN COMPARISON:  CTA head neck 11/04/2017. FINDINGS: MRI HEAD FINDINGS Brain: Multiple small foci of restricted diffusion are seen in the LEFT hemisphere, affecting the subcortical and periventricular deep white matter largely sparing the cortex, consistent with multiple watershed infarcts. No hemorrhage, mass lesion, hydrocephalus, or extra-axial fluid. Generalized atrophy, premature for age. Moderate T2 and FLAIR hyperintensities in the white matter, consistent with small vessel disease. Remote LEFT periventricular infarct near the caudate. Post infusion, no abnormal enhancement of the brain or meninges. Vascular: Normal flow voids. Skull and upper cervical spine: Normal marrow signal. Other: None. MRI ORBITS FINDINGS Orbits: No traumatic or inflammatory finding. Globes, optic nerves, orbital fat, extraocular muscles, vascular structures, and lacrimal glands are normal. Visualized sinuses: Clear. Soft tissues: Negative. Limited intracranial: Reported separately. IMPRESSION: MRI brain demonstrating multifocal subcentimeter watershed infarcts of the LEFT hemisphere. High-grade stenosis of the LEFT CCA is likely contributory; multiple emboli are possible, less favored. Atrophy and small vessel disease. Negative MRI  of the orbits.  Electronically Signed   By: Staci Righter M.D.   On: 11/05/2017 17:24     Subjective: - no chest pain, shortness of breath, no abdominal pain, nausea or vomiting.   Discharge Exam: Vitals:   11/08/17 0012 11/08/17 0416  BP: 125/73 (!) 148/67  Pulse: 81 85  Resp: 17 (!) 29  Temp: 98.9 F (37.2 C) 98.6 F (37 C)  SpO2: 100%     General: Pt is alert, awake, not in acute distress Cardiovascular: RRR, S1/S2 +, no rubs, no gallops Respiratory: CTA bilaterally, no wheezing, no rhonchi Abdominal: Soft, NT, ND, bowel sounds + Extremities: no edema, no cyanosis    The results of significant diagnostics from this hospitalization (including imaging, microbiology, ancillary and laboratory) are listed below for reference.     Microbiology: Recent Results (from the past 240 hour(s))  Urine culture     Status: None   Collection Time: 11/04/17 11:46 PM  Result Value Ref Range Status   Specimen Description   Final    URINE, CLEAN CATCH Performed at [facility], [place] [address] [place], [place] [address]    Special Requests   Final    NONE Performed at [facility], [place] [address], [place], [place] [address]    Culture   Final    NO GROWTH Performed at [facility], [place] [address], [place], [place] [address]    Report Status 11/06/2017 FINAL  Final  MRSA PCR Screening     Status: Abnormal   Collection Time: 11/05/17  2:55 AM  Result Value Ref Range Status   MRSA by PCR INVALID RESULTS, SPECIMEN SENT FOR CULTURE (A) NEGATIVE Final    Comment:        The GeneXpert MRSA Assay (FDA approved for NASAL specimens only), is one component of a comprehensive MRSA colonization surveillance program. It is not intended to diagnose MRSA infection nor to guide or monitor treatment for MRSA infections.   MRSA culture     Status: None   Collection Time: 11/05/17  5:45 AM  Result Value Ref Range Status   Specimen Description NASAL SWAB  Final     Special Requests NONE  Final   Culture   Final    NO MRSA DETECTED Performed at [facility], [address] [place], [place] [address]    Report Status 11/06/2017 FINAL  Final  MRSA PCR Screening     Status: None   Collection Time: 11/05/17  6:10 PM  Result Value Ref Range Status   MRSA by PCR NEGATIVE NEGATIVE Final    Comment:        The GeneXpert MRSA Assay (FDA approved for NASAL specimens only), is one component of a comprehensive MRSA colonization surveillance program. It is not intended to diagnose MRSA infection nor to guide or monitor treatment for MRSA infections. Performed at [facility], [place] [address] [place], [place] [address]      Labs: BNP (last 3 results) No results for input(s): BNP in the last 8760 hours. Basic Metabolic Panel: Recent Labs  Lab 11/04/17 1353 11/05/17 0253 11/06/17 0309 11/07/17 0556 11/08/17 0446  NA 136 135 138 139 144  K 2.9* 3.5 3.8 4.5 5.2*  CL 101 104 111 112* 111  CO2 24 22 21* 20* 21*  GLUCOSE 97 86 96 133* 79  BUN 11 7 5* 6 5*  CREATININE 1.05 0.95 0.93 0.97 1.07  CALCIUM 8.5* 8.4* 8.2*  8.3* 8.6*   Liver Function Tests: Recent Labs  Lab 11/05/17 0253  AST 16  ALT 11*  ALKPHOS 113  BILITOT 2.0*  PROT 6.0*  ALBUMIN 3.2*   No results for input(s): LIPASE, AMYLASE in the last 168 hours. No results for input(s): AMMONIA in the last 168 hours. CBC: Recent Labs  Lab 11/04/17 1353  11/05/17 0253 11/05/17 2237 11/06/17 0309 11/07/17 0556 11/08/17 0811  WBC 6.6  --  7.1  --  10.0 10.6* 9.8  NEUTROABS 3.7  --   --   --   --   --   --   HGB 4.7*   < > 7.2* 9.6* 9.1* 8.6* 8.6*  HCT 15.4*   < > 23.0* 30.1* 28.3* 27.2* 28.4*  MCV 74.4*  --  79.6  --  78.4 79.5 83.5  PLT 339  --  305  --  278 258 223   < > = values in this interval not displayed.   Cardiac Enzymes: No results for input(s): CKTOTAL, CKMB, CKMBINDEX, TROPONINI in the last 168 hours. BNP: Invalid input(s): POCBNP CBG: Recent  Labs  Lab 11/05/17 0620 11/06/17 0617 11/07/17 0553 11/08/17 0616  GLUCAP 93 96 130* 173*   D-Dimer No results for input(s): DDIMER in the last 72 hours. Hgb A1c No results for input(s): HGBA1C in the last 72 hours. Lipid Profile No results for input(s): CHOL, HDL, LDLCALC, TRIG, CHOLHDL, LDLDIRECT in the last 72 hours. Thyroid function studies No results for input(s): TSH, T4TOTAL, T3FREE, THYROIDAB in the last 72 hours.  Invalid input(s): FREET3 Anemia work up No results for input(s): VITAMINB12, FOLATE, FERRITIN, TIBC, IRON, RETICCTPCT in the last 72 hours. Urinalysis    Component Value Date/Time   COLORURINE YELLOW 11/04/2017 [place] 11/04/2017 1652   LABSPEC 1.023 11/04/2017 1652   PHURINE 7.0 11/04/2017 1652   GLUCOSEU NEGATIVE 11/04/2017 1652   HGBUR NEGATIVE 11/04/2017 1652   BILIRUBINUR NEGATIVE 11/04/2017 1652   KETONESUR NEGATIVE 11/04/2017 1652   PROTEINUR NEGATIVE 11/04/2017 1652   UROBILINOGEN 0.2 06/28/2017 1152   NITRITE NEGATIVE 11/04/2017 1652   LEUKOCYTESUR NEGATIVE 11/04/2017 1652   Sepsis Labs Invalid input(s): PROCALCITONIN,  WBC,  LACTICIDVEN   Time coordinating discharge: 40 minutes  SIGNED:  Marzetta Board, MD  [facility] 11/08/2017, 2:08 PM Pager [phone]  If 7PM-7AM, please contact night-coverage [email or web address] Password [email or web address]

## 2017-11-08 NOTE — Anesthesia Postprocedure Evaluation (Signed)
Anesthesia Post Note  Patient: [name]  Procedure(s) Performed: ENDARTERECTOMY CAROTID (Left Neck)     Patient location during evaluation: PACU Anesthesia Type: General Level of consciousness: awake and alert Pain management: pain level controlled Vital Signs Assessment: post-procedure vital signs reviewed and stable Respiratory status: spontaneous breathing, nonlabored ventilation, respiratory function stable and patient connected to nasal cannula oxygen Cardiovascular status: blood pressure returned to baseline and stable Postop Assessment: no apparent nausea or vomiting Anesthetic complications: no    Last Vitals:  Vitals:   11/08/17 0012 11/08/17 0416  BP: 125/73 (!) 148/67  Pulse: 81 85  Resp: 17 (!) 29  Temp: 37.2 C 37 C  SpO2: 100%     Last Pain:  Vitals:   11/08/17 0416  TempSrc: Oral  PainSc:                  Cashlynn Yearwood S

## 2017-11-08 NOTE — Progress Notes (Signed)
Pages on call provider about patient potassium level of 5.2 and continues on NS with 40 of potassium infusing at 75? Wants clarification about the continuous infusion.

## 2017-11-08 NOTE — Plan of Care (Signed)
  Problem: Education: Goal: Knowledge of General Education information will improve Outcome: Progressing   Problem: Health Behavior/Discharge Planning: Goal: Ability to manage health-related needs will improve Outcome: Progressing   Problem: Education: Goal: Knowledge of disease or condition will improve Outcome: Progressing

## 2017-11-08 NOTE — Discharge Instructions (Signed)
Follow with Dorena Dew, FNP in 5-7 days  Please get a complete blood count and chemistry panel checked by your Primary MD at your next visit, and again as instructed by your Primary MD. Please get your medications reviewed and adjusted by your Primary MD.  Please request your Primary MD to go over all Hospital Tests and Procedure/Radiological results at the follow up, please get all Hospital records sent to your Prim MD by signing hospital release before you go home.  If you had Pneumonia of Lung problems at the Hospital: Please get a 2 view Chest X ray done in 6-8 weeks after hospital discharge or sooner if instructed by your Primary MD.  If you have Congestive Heart Failure: Please call your Cardiologist or Primary MD anytime you have any of the following symptoms:  1) 3 pound weight gain in 24 hours or 5 pounds in 1 week  2) shortness of breath, with or without a dry hacking cough  3) swelling in the hands, feet or stomach  4) if you have to sleep on extra pillows at night in order to breathe  Follow cardiac low salt diet and 1.5 lit/day fluid restriction.  If you have diabetes Accuchecks 4 times/day, Once in AM empty stomach and then before each meal. Log in all results and show them to your primary doctor at your next visit. If any glucose reading is under 80 or above 300 call your primary MD immediately.  If you have Seizure/Convulsions/Epilepsy: Please do not drive, operate heavy machinery, participate in activities at heights or participate in high speed sports until you have seen by Primary MD or a Neurologist and advised to do so again.  If you had Gastrointestinal Bleeding: Please ask your Primary MD to check a complete blood count within one week of discharge or at your next visit. Your endoscopic/colonoscopic biopsies that are pending at the time of discharge, will also need to followed by your Primary MD.  Get Medicines reviewed and adjusted. Please take all your  medications with you for your next visit with your Primary MD  Please request your Primary MD to go over all hospital tests and procedure/radiological results at the follow up, please ask your Primary MD to get all Hospital records sent to his/her office.  If you experience worsening of your admission symptoms, develop shortness of breath, life threatening emergency, suicidal or homicidal thoughts you must seek medical attention immediately by calling 911 or calling your MD immediately  if symptoms less severe.  You must read complete instructions/literature along with all the possible adverse reactions/side effects for all the Medicines you take and that have been prescribed to you. Take any new Medicines after you have completely understood and accpet all the possible adverse reactions/side effects.   Do not drive or operate heavy machinery when taking Pain medications.   Do not take more than prescribed Pain, Sleep and Anxiety Medications  Special Instructions: If you have smoked or chewed Tobacco  in the last 2 yrs please stop smoking, stop any regular Alcohol  and or any Recreational drug use.  Wear Seat belts while driving.  Please note You were cared for by a hospitalist during your hospital stay. If you have any questions about your discharge medications or the care you received while you were in the hospital after you are discharged, you can call the unit and asked to speak with the hospitalist on call if the hospitalist that took care of you is not available.  Once you are discharged, your primary care physician will handle any further medical issues. Please note that NO REFILLS for any discharge medications will be authorized once you are discharged, as it is imperative that you return to your primary care physician (or establish a relationship with a primary care physician if you do not have one) for your aftercare needs so that they can reassess your need for medications and monitor your  lab values.  You can reach the hospitalist office at phone [phone] or fax [phone]   If you do not have a primary care physician, you can call [phone] for a physician referral.  Activity: As tolerated with Full fall precautions use walker/cane & assistance as needed  Diet: heart healthy  Disposition Home

## 2017-11-08 NOTE — Progress Notes (Signed)
  Echocardiogram 2D Echocardiogram has been performed.  [name] 11/08/2017, 10:22 AM

## 2017-11-08 NOTE — Progress Notes (Signed)
Pt reviewed discharge summary and medication list prior to discharge.  IV's removed.  Pt assisted by staff to private vehicle.   [name]

## 2017-11-09 MED FILL — CLOPIDOGREL 75 MG TABLET: 75 | 30 days supply | Qty: 30 | Fill #0

## 2017-11-09 MED FILL — NICOTINE 14 MG/24HR PATCH: 14 | 28 days supply | Qty: 28 | Fill #0

## 2017-11-09 MED FILL — ATORVASTATIN 80 MG TABLET: 80 | 30 days supply | Qty: 30 | Fill #2

## 2017-11-09 MED FILL — PANTOPRAZOLE SOD DR 40 MG T: 40 | 30 days supply | Qty: 30 | Fill #0

## 2017-11-21 NOTE — Progress Notes (Deleted)
Postoperative Visit   History of Present Illness   [name] is a 62 y.o. male who presents for postoperative follow-up for: left CEA (Date: 11/06/17) for L sx CCA stenosis >70%.  The patient had severe anemia felt due to GI bleeding.  The patient's L CVA was felt to be due to severe anemia leading to a watershed ischemia in the L hemisphere due to the >70% stenosis in the CCA.  The patient's neck incision is *** healed.  The patient has had *** stroke or TIA symptoms.  Current Outpatient Medications  Medication Sig Dispense Refill  . atorvastatin (LIPITOR) 80 MG tablet Take 1 tablet (80 mg total) by mouth daily at 6 PM. 30 tablet 1  . clopidogrel (PLAVIX) 75 MG tablet Take 1 tablet (75 mg total) by mouth daily. 30 tablet 1  . fluticasone (FLONASE) 50 MCG/ACT nasal spray Place 2 sprays into both nostrils as needed for allergies or rhinitis.    . fluticasone furoate-vilanterol (BREO ELLIPTA) 200-25 MCG/INH AEPB Inhale 1 puff into the lungs daily. 180 each 3  . hydrochlorothiazide (MICROZIDE) 12.5 MG capsule TAKE 1 CAPSULE BY MOUTH DAILY. 90 capsule 1  . Multiple Vitamin (MULTIVITAMIN WITH MINERALS) TABS tablet Take 1 tablet by mouth daily. 30 tablet 1  . nicotine (NICODERM CQ) 14 mg/24hr patch Place 1 patch (14 mg total) onto the skin daily. 28 patch 0  . pantoprazole (PROTONIX) 40 MG tablet Take 1 tablet (40 mg total) by mouth daily. 30 tablet 1  . triamcinolone (KENALOG) 0.025 % ointment APPLY 1 APPLICATION TOPICALLY 2 TIMES DAILY. (Patient taking differently: APPLY 1 APPLICATION TOPICALLY 2 TIMES qd prn rash) 30 g 2  . VENTOLIN HFA 108 (90 Base) MCG/ACT inhaler INHALE 2 PUFFS INTO THE LUNGS EVERY 6 HOURS AS NEEDED FOR WHEEZING OR SHORTNESS OF BREATH. 18 g 2   No current facility-administered medications for this visit.     For VQI Use Only   PRE-ADM LIVING: {VQI Pre-admission Living:20973}  AMB STATUS: {VQI Ambulatory Status:20974}   Physical Examination  ***There were no  vitals filed for this visit.  left Neck: Incision is *** healed  Neuro: CN 2-12 are intact *** except ***, Motor strength is ***/5 *** bilaterally, sensation is *** grossly intact   Medical Decision Making   [name] is a 62 y.o. male who presents s/p left CEA for sx L CCA stenosis >70%   The patient's neck incision is *** healing with no stroke symptoms. I discussed in depth with the patient the nature of atherosclerosis, and emphasized the importance of maximal medical management including strict control of blood pressure, blood glucose, and lipid levels, obtaining regular exercise, anti-platelet use and cessation of smoking.   The patient is currently on a statin: Lipitor.  The patient is currently on an anti-platelet: Plavix. The patient is aware that without maximal medical management the underlying atherosclerotic disease process will progress, limiting the benefit of any interventions. The patient's surveillance will included routine carotid duplex studies which will be completed in: *** months, at which time the patient will be re-evaluated.   I emphasized the importance of routine surveillance of the carotid arteries as recurrence of stenosis is possible, especially with proper management of underlying atherosclerotic disease. The patient agrees to participate in their maximal medical care and routine surveillance.  Thank you for allowing [place] to participate in this patient's care.  Adele Barthel, MD, FACS [facility] Office: [phone] Pager: [phone]

## 2017-11-22 ENCOUNTER — Encounter: Payer: Self-pay | Admitting: Vascular Surgery

## 2017-11-28 MED FILL — HYDROCHLOROTHIAZIDE 12.5 MG: 12.5 | 30 days supply | Qty: 30 | Fill #2

## 2017-12-18 ENCOUNTER — Other Ambulatory Visit: Payer: Self-pay

## 2017-12-18 ENCOUNTER — Other Ambulatory Visit: Payer: Self-pay | Admitting: Family Medicine

## 2017-12-18 DIAGNOSIS — J449 Chronic obstructive pulmonary disease, unspecified: Secondary | ICD-10-CM

## 2017-12-18 DIAGNOSIS — F17200 Nicotine dependence, unspecified, uncomplicated: Secondary | ICD-10-CM

## 2017-12-18 MED ORDER — NICOTINE 14 MG/24HR TD PT24: 14.0000 mg | MEDICATED_PATCH | Freq: Every day | TRANSDERMAL | 0 refills | Status: DC

## 2017-12-18 MED FILL — $BREO ELLIPTA 200-25 MCG IN: 200-25 | 90 days supply | Qty: 180 | Fill #5

## 2017-12-18 MED FILL — CLOPIDOGREL 75 MG TABLET: 75 | 30 days supply | Qty: 30 | Fill #1

## 2017-12-18 MED FILL — ATORVASTATIN 80 MG TABLET: 80 | 30 days supply | Qty: 30 | Fill #3

## 2017-12-18 MED FILL — PANTOPRAZOLE SOD DR 40 MG T: 40 | 30 days supply | Qty: 30 | Fill #1

## 2017-12-18 MED FILL — $VENTOLIN HFA 18G INHALER: 108 (90 BAS | 75 days supply | Qty: 54 | Fill #0

## 2017-12-27 ENCOUNTER — Ambulatory Visit: Payer: Self-pay | Admitting: Family Medicine

## 2018-01-01 ENCOUNTER — Encounter: Payer: Self-pay | Admitting: Family Medicine

## 2018-01-01 ENCOUNTER — Ambulatory Visit (INDEPENDENT_AMBULATORY_CARE_PROVIDER_SITE_OTHER): Payer: Self-pay | Admitting: Family Medicine

## 2018-01-01 VITALS — BP 121/67 | HR 95 | Temp 98.7°F | Resp 16 | Ht 73.0 in | Wt 194.0 lb

## 2018-01-01 DIAGNOSIS — H5462 Unqualified visual loss, left eye, normal vision right eye: Secondary | ICD-10-CM

## 2018-01-01 DIAGNOSIS — I6350 Cerebral infarction due to unspecified occlusion or stenosis of unspecified cerebral artery: Secondary | ICD-10-CM

## 2018-01-01 DIAGNOSIS — I10 Essential (primary) hypertension: Secondary | ICD-10-CM

## 2018-01-01 DIAGNOSIS — Z720 Tobacco use: Secondary | ICD-10-CM

## 2018-01-01 DIAGNOSIS — J449 Chronic obstructive pulmonary disease, unspecified: Secondary | ICD-10-CM

## 2018-01-01 DIAGNOSIS — I639 Cerebral infarction, unspecified: Secondary | ICD-10-CM | POA: Insufficient documentation

## 2018-01-01 LAB — POCT URINALYSIS DIPSTICK
Bilirubin, UA: NEGATIVE
Blood, UA: NEGATIVE
Glucose, UA: NEGATIVE
Ketones, UA: NEGATIVE
Leukocytes, UA: NEGATIVE
Nitrite, UA: NEGATIVE
Protein, UA: NEGATIVE
Spec Grav, UA: 1.02 (ref 1.010–1.025)
Urobilinogen, UA: 0.2 E.U./dL
pH, UA: 5.5 (ref 5.0–8.0)

## 2018-01-01 MED ORDER — FLUTICASONE FUROATE-VILANTEROL 200-25 MCG/INH IN AEPB: 1.0000 | INHALATION_SPRAY | Freq: Every day | RESPIRATORY_TRACT | 3 refills | Status: DC

## 2018-01-01 MED ORDER — ALBUTEROL SULFATE HFA 108 (90 BASE) MCG/ACT IN AERS: 2.0000 | INHALATION_SPRAY | Freq: Four times a day (QID) | RESPIRATORY_TRACT | 2 refills | Status: DC | PRN

## 2018-01-01 MED ORDER — NICOTINE 14 MG/24HR TD PT24: 14.0000 mg | MEDICATED_PATCH | Freq: Every day | TRANSDERMAL | 0 refills | Status: DC

## 2018-01-01 MED ORDER — HYDROCHLOROTHIAZIDE 12.5 MG PO CAPS: 12.5000 mg | ORAL_CAPSULE | Freq: Every day | ORAL | 1 refills | Status: DC

## 2018-01-01 MED ORDER — VARENICLINE TARTRATE 0.5 MG X 11 & 1 MG X 42 PO MISC: ORAL | 0 refills | Status: DC

## 2018-01-01 MED FILL — NICOTINE 14 MG/24HR PATCH: 14 | 28 days supply | Qty: 28 | Fill #0

## 2018-01-01 MED FILL — ALBUTEROL SULFATE HFA 108 (: 108 (90 BAS | 25 days supply | Qty: 9 | Fill #0

## 2018-01-01 MED FILL — HYDROCHLOROTHIAZIDE 12.5 MG: 12.5 | 30 days supply | Qty: 30 | Fill #0

## 2018-01-01 NOTE — Progress Notes (Signed)
Subjective   [name] 62 y.o. male  [id number]  [id number]  [birth year]    Chief Complaint  Patient presents with  . Hypertension    Ms. [name], a 62 year old male with a history of hypertension and COPD presents for a 6 month follow up. Patient has been taking hydrochlorothiazide consistently, exercising, and following a low fat, low sodium diet.  He does not check blood pressures at home. Patient denies chest pain, chest pressure/discomfort, dyspnea, fatigue, lower extremity edema, orthopnea, palpitations, syncope and tachypnea.  Cardiovascular risk factors include: sedentary lifestyle and smoking/ tobacco exposure. He is trying to quit smoking. Has tried chantix in the past, but did not use for appropriate amount of time. Denied side effects from this medications. Using otc nicotine patch without relief.  Patient seen in the ED 11/04/2017 due to loss of vision in the left eye. Acute CVA resulting in left eye vision loss.  MRI brain significant for multifocal sub-centimeters watershed infarcts of the left hemisphere, as well as evidence of high-grade stenosis of left CCA.  Patient states that he contributes his poor health to smoking and hx of illicit drug use.    Review of Systems  Constitutional: Negative.   HENT: Negative.   Eyes:       Loss of vision in the left eye. States that he can see shadows and that vision is improving.   Gastrointestinal: Negative.   Musculoskeletal: Negative.   Neurological: Negative.   Psychiatric/Behavioral: Negative.     Objective   Physical Exam  Constitutional: He is oriented to person, place, and time. He appears well-developed and well-nourished.  HENT:  Head: Normocephalic and atraumatic.  Right Ear: External ear normal.  Left Ear: External ear normal.  Nose: Nose normal.  Mouth/Throat: Oropharynx is clear and moist.  Eyes: Pupils are equal, round, and reactive to light. Conjunctivae and EOM are normal.  Neck: Normal  range of motion. Neck supple. No tracheal deviation present. No thyromegaly present.  Cardiovascular: Normal rate, regular rhythm, normal heart sounds and intact distal pulses. Exam reveals no friction rub.  No murmur heard. Pulmonary/Chest: Effort normal and breath sounds normal. No stridor. No respiratory distress. He has no wheezes.  Abdominal: Soft. Bowel sounds are normal. He exhibits no distension and no mass. There is no tenderness.  Musculoskeletal: Normal range of motion. He exhibits no edema.  Lymphadenopathy:    He has no cervical adenopathy.  Neurological: He is alert and oriented to person, place, and time.  Skin: Skin is warm and dry.  Psychiatric: He has a normal mood and affect. His behavior is normal. Judgment and thought content normal.  Nursing note and vitals reviewed.   BP 121/67 (BP Location: Right Arm, Patient Position: Sitting, Cuff Size: Large)   Pulse 95   Temp 98.7 F (37.1 C) (Oral)   Resp 16   Ht 6\' 1"  (1.854 m)   Wt 194 lb (88 kg)   SpO2 99%   BMI 25.60 kg/m   Assessment   Encounter Diagnosis  Name Primary?  . Essential hypertension Yes     Plan  1. Essential hypertension Continue with current medications  - Urinalysis Dipstick - hydrochlorothiazide (MICROZIDE) 12.5 MG capsule; Take 1 capsule (12.5 mg total) by mouth daily.  Dispense: 90 capsule; Refill: 1 - CBC With Differential; Future - Comprehensive metabolic panel; Future - PSA; Future - Lipid Panel; Future  2. Chronic obstructive pulmonary disease, unspecified COPD type ([place]) Continue with current medications  -  albuterol (VENTOLIN HFA) 108 (90 Base) MCG/ACT inhaler; Inhale 2 puffs into the lungs every 6 (six) hours as needed for wheezing or shortness of breath.  Dispense: 18 g; Refill: 2 - fluticasone furoate-vilanterol (BREO ELLIPTA) 200-25 MCG/INH AEPB; Inhale 1 puff into the lungs daily.  Dispense: 180 each; Refill: 3  3. Vision loss of left eye Patient has not followed up with  ophthalmology or neurology since leaving hospital.   - Ambulatory referral to Ophthalmology  4. Tobacco abuse - varenicline (CHANTIX STARTING MONTH PAK) 0.5 MG X 11 & 1 MG X 42 tablet; Take 1 0.5 mg tablet by mouth once daily for 3 days,  increase to one 0.5 mg tablet bid for 4 days, then increase to one 1 mg BID  Dispense: 53 tablet; Refill: 0 - nicotine (NICODERM CQ - DOSED IN MG/24 HOURS) 14 mg/24hr patch; Place 1 patch (14 mg total) onto the skin daily.  Dispense: 28 patch; Refill: 0   5. Cerebrovascular accident (CVA) due to occlusion of cerebral artery [facility]) Referral to neurology.     This note has been created with Surveyor, quantity. Any transcriptional errors are unintentional.

## 2018-01-01 NOTE — Patient Instructions (Signed)
It was a pleasure meeting you. I will see you in 6 months.  I sent the Chantix started pack for you to the pharmacy. If you are having any side effects including suicidal thoughts, bad or vivid dreams, or sickness, stop the medication and call the office   Don't forget to get your fasting labs one morning. Nothing to eat or drink except water and BLACK coffee (no cream or sugar).  Steps to Quit Smoking Smoking tobacco can be bad for your health. It can also affect almost every organ in your body. Smoking puts you and people around you at risk for many serious long-lasting (chronic) diseases. Quitting smoking is hard, but it is one of the best things that you can do for your health. It is never too late to quit. What are the benefits of quitting smoking? When you quit smoking, you lower your risk for getting serious diseases and conditions. They can include:  Lung cancer or lung disease.  Heart disease.  Stroke.  Heart attack.  Not being able to have children (infertility).  Weak bones (osteoporosis) and broken bones (fractures).  If you have coughing, wheezing, and shortness of breath, those symptoms may get better when you quit. You may also get sick less often. If you are pregnant, quitting smoking can help to lower your chances of having a baby of low birth weight. What can I do to help me quit smoking? Talk with your doctor about what can help you quit smoking. Some things you can do (strategies) include:  Quitting smoking totally, instead of slowly cutting back how much you smoke over a period of time.  Going to in-person counseling. You are more likely to quit if you go to many counseling sessions.  Using resources and support systems, such as: ? Database administrator with a Social worker. ? Phone quitlines. ? Careers information officer. ? Support groups or group counseling. ? Text messaging programs. ? Mobile phone apps or applications.  Taking medicines. Some of these medicines may  have nicotine in them. If you are pregnant or breastfeeding, do not take any medicines to quit smoking unless your doctor says it is okay. Talk with your doctor about counseling or other things that can help you.  Talk with your doctor about using more than one strategy at the same time, such as taking medicines while you are also going to in-person counseling. This can help make quitting easier. What things can I do to make it easier to quit? Quitting smoking might feel very hard at first, but there is a lot that you can do to make it easier. Take these steps:  Talk to your family and friends. Ask them to support and encourage you.  Call phone quitlines, reach out to support groups, or work with a Social worker.  Ask people who smoke to not smoke around you.  Avoid places that make you want (trigger) to smoke, such as: ? Bars. ? Parties. ? Smoke-break areas at work.  Spend time with people who do not smoke.  Lower the stress in your life. Stress can make you want to smoke. Try these things to help your stress: ? Getting regular exercise. ? Deep-breathing exercises. ? Yoga. ? Meditating. ? Doing a body scan. To do this, close your eyes, focus on one area of your body at a time from head to toe, and notice which parts of your body are tense. Try to relax the muscles in those areas.  Download or buy apps on your  mobile phone or tablet that can help you stick to your quit plan. There are many free apps, such as QuitGuide from the [employer] Office manager for Disease Control and Prevention). You can find more support from smokefree.gov and other websites.  This information is not intended to replace advice given to you by your health care provider. Make sure you discuss any questions you have with your health care provider. Document Released: 03/26/2009 Document Revised: 01/26/2016 Document Reviewed: 10/14/2014 Elsevier Interactive Patient Education  2018 [employer].

## 2018-01-04 ENCOUNTER — Ambulatory Visit: Payer: Self-pay | Admitting: Neurology

## 2018-01-04 ENCOUNTER — Other Ambulatory Visit: Payer: Self-pay

## 2018-01-04 ENCOUNTER — Encounter: Payer: Self-pay | Admitting: Neurology

## 2018-01-04 VITALS — BP 130/76 | HR 78 | Ht 72.0 in | Wt 193.0 lb

## 2018-01-04 DIAGNOSIS — I10 Essential (primary) hypertension: Secondary | ICD-10-CM

## 2018-01-04 DIAGNOSIS — E785 Hyperlipidemia, unspecified: Secondary | ICD-10-CM

## 2018-01-04 DIAGNOSIS — I6522 Occlusion and stenosis of left carotid artery: Secondary | ICD-10-CM

## 2018-01-04 DIAGNOSIS — H3412 Central retinal artery occlusion, left eye: Secondary | ICD-10-CM

## 2018-01-04 DIAGNOSIS — Z951 Presence of aortocoronary bypass graft: Secondary | ICD-10-CM

## 2018-01-04 DIAGNOSIS — I63232 Cerebral infarction due to unspecified occlusion or stenosis of left carotid arteries: Secondary | ICD-10-CM

## 2018-01-04 DIAGNOSIS — F17200 Nicotine dependence, unspecified, uncomplicated: Secondary | ICD-10-CM

## 2018-01-04 NOTE — Progress Notes (Signed)
NEUROLOGY CONSULTATION NOTE  [name] MRN: [id number] DOB: [birth year]  Referring provider: Lanae Boast, FNP Primary care provider: Lanae Boast, FNP  Reason for consult:  stroke  HISTORY OF PRESENT ILLNESS: [name] is a 62 year old right-handed male with HTN, HLD< CAD with history of MI s/p CABG, COPD, substance abuse (cocaine and marijuana), tobacco abuse who presents for recent stroke.  History supplemented by hospital records.  CT and MRI from hospitalization personally reviewed.  He was admitted to [facility] on 11/04/17 after he woke up with total vision loss in the left eye.  There was no associated headache or ocular pain.  CT of brain showed no acute abnormalities.  CTA of head and neck showed high-grade stenosis of the mid left common carotid artery greater than 75%.  Soft plaque noted in the proximal left internal carotid artery without focal stenosis.  No focal stenosis of the ophthalmic segment on the left.  MRI of brain showed multifocal subcentimeter watershed infarcts in the left hemisphere.  LDL was 42.  Hgb A1c was 5.2.  Echocardiogram 60-65%.  He underwent left endarterectomy on 5/27.  Hospitalization was complicated by acute blood loss due to GI bleed.  Endoscopy revealed no active bleeding.  ASA was changed to Plavix 75mg  daily and he was continued on atorvastastin 80mg .  He still has vision loss in the left eye.  PAST MEDICAL HISTORY: Past Medical History:  Diagnosis Date  . Anginal pain ([place])   . CAD (coronary artery disease)    4V CABG 10/31/13  . Cocaine abuse ([place])   . COPD (chronic obstructive pulmonary disease) ([place])   . GERD (gastroesophageal reflux disease)   . H/O: eczema    lower extremities  . H/O: GI bleed    a. 62 y/o ago, r/t ulcer. Denies recent GIB.  Marland Kitchen High cholesterol   . Hypertension   . Myocardial infarction ([place])   . Perforation of duodenal ulcer ([place])    a. 1970s - s/p surgery.   . Shortness of breath dyspnea     . Tobacco abuse     PAST SURGICAL HISTORY: Past Surgical History:  Procedure Laterality Date  . BIOPSY  11/05/2017   Procedure: BIOPSY;  Surgeon: Wilford Corner, MD;  Location: [place];  Service: Endoscopy;;  . CORONARY ARTERY BYPASS GRAFT N/A 10/31/2013   Procedure: CORONARY ARTERY BYPASS GRAFTING TIMES FOUR ON PUMP USING LEFT INTERNAL MAMMARY ARTERY AND RIGHT GREATER SAPHENOUS VEIN VIA ENDOVEIN HARVEST.;  Surgeon: Melrose Nakayama, MD;  Location: [place];  Service: Open Heart Surgery;  Laterality: N/A;  . ENDARTERECTOMY Left 11/06/2017   Procedure: ENDARTERECTOMY CAROTID;  Surgeon: Conrad , MD;  Location: [place];  Service: Vascular;  Laterality: Left;  . ESOPHAGOGASTRODUODENOSCOPY (EGD) WITH PROPOFOL N/A 11/05/2017   Procedure: ESOPHAGOGASTRODUODENOSCOPY (EGD) WITH PROPOFOL;  Surgeon: Wilford Corner, MD;  Location: [place];  Service: Endoscopy;  Laterality: N/A;  . INTRAOPERATIVE TRANSESOPHAGEAL ECHOCARDIOGRAM N/A 10/31/2013   Procedure: INTRAOPERATIVE TRANSESOPHAGEAL ECHOCARDIOGRAM;  Surgeon: Melrose Nakayama, MD;  Location: [place];  Service: Open Heart Surgery;  Laterality: N/A;  . LEFT HEART CATHETERIZATION WITH CORONARY ANGIOGRAM N/A 10/28/2013   Procedure: LEFT HEART CATHETERIZATION WITH CORONARY ANGIOGRAM;  Surgeon: Burnell Blanks, MD;  Location: [facility] CATH LAB;  Service: Cardiovascular;  Laterality: N/A;  . stomach sx      MEDICATIONS: Current Outpatient Medications on File Prior to Visit  Medication Sig Dispense Refill  . albuterol (VENTOLIN HFA) 108 (90 Base) MCG/ACT  inhaler Inhale 2 puffs into the lungs every 6 (six) hours as needed for wheezing or shortness of breath. 18 g 2  . atorvastatin (LIPITOR) 80 MG tablet Take 1 tablet (80 mg total) by mouth daily at 6 PM. 30 tablet 1  . clopidogrel (PLAVIX) 75 MG tablet Take 1 tablet (75 mg total) by mouth daily. 30 tablet 1  . fluticasone (FLONASE) 50 MCG/ACT nasal spray Place 2 sprays into both nostrils as  needed for allergies or rhinitis.    . fluticasone furoate-vilanterol (BREO ELLIPTA) 200-25 MCG/INH AEPB Inhale 1 puff into the lungs daily. 180 each 3  . hydrochlorothiazide (MICROZIDE) 12.5 MG capsule Take 1 capsule (12.5 mg total) by mouth daily. 90 capsule 1  . Multiple Vitamin (MULTIVITAMIN WITH MINERALS) TABS tablet Take 1 tablet by mouth daily. 30 tablet 1  . nicotine (NICODERM CQ - DOSED IN MG/24 HOURS) 14 mg/24hr patch Place 1 patch (14 mg total) onto the skin daily. 28 patch 0  . pantoprazole (PROTONIX) 40 MG tablet Take 1 tablet (40 mg total) by mouth daily. 30 tablet 1  . triamcinolone (KENALOG) 0.025 % ointment APPLY 1 APPLICATION TOPICALLY 2 TIMES DAILY. (Patient taking differently: APPLY 1 APPLICATION TOPICALLY 2 TIMES qd prn rash) 30 g 2  . varenicline (CHANTIX STARTING MONTH PAK) 0.5 MG X 11 & 1 MG X 42 tablet Take 1 0.5 mg tablet by mouth once daily for 3 days,  increase to one 0.5 mg tablet bid for 4 days, then increase to one 1 mg BID 53 tablet 0   No current facility-administered medications on file prior to visit.     ALLERGIES: No Known Allergies  FAMILY HISTORY: Family History  Problem Relation Age of Onset  . Lung cancer Mother   . Alzheimer's disease Mother   . Lung cancer Father   . HIV/AIDS Brother   . Obesity Brother   . Lung cancer Unknown     SOCIAL HISTORY: Social History   Socioeconomic History  . Marital status: Single    Spouse name: Not on file  . Number of children: 1  . Years of education: Not on file  . Highest education level: Bachelor's degree (e.g., BA, AB, BS)  Occupational History  . Occupation: retired  Scientific laboratory technician  . Financial resource strain: Not on file  . Food insecurity:    Worry: Not on file    Inability: Not on file  . Transportation needs:    Medical: Not on file    Non-medical: Not on file  Tobacco Use  . Smoking status: Current Every Day Smoker    Packs/day: 0.50    Years: 40.00    Pack years: 20.00    Types:  Cigarettes  . Smokeless tobacco: Never Used  . Tobacco comment: starting Chantix 01/04/18  Substance and Sexual Activity  . Alcohol use: Yes    Alcohol/week: 2.4 oz    Types: 4 Standard drinks or equivalent per week    Comment: vodka  . Drug use: Yes    Types: Marijuana, Cocaine    Comment: "about 1 marijuana cigarette a week"  . Sexual activity: Yes    Birth control/protection: None  Lifestyle  . Physical activity:    Days per week: Not on file    Minutes per session: Not on file  . Stress: Not on file  Relationships  . Social connections:    Talks on phone: Not on file    Gets together: Not on file    Attends  religious service: Not on file    Active member of club or organization: Not on file    Attends meetings of clubs or organizations: Not on file    Relationship status: Not on file  . Intimate partner violence:    Fear of current or ex partner: Not on file    Emotionally abused: Not on file    Physically abused: Not on file    Forced sexual activity: Not on file  Other Topics Concern  . Not on file  Social History Narrative   Pt is right-handed. He lives alone in a one story house. He drinks 2 cups coffee a day, and a liter of ginger-ale. He walks 4 x a week.    REVIEW OF SYSTEMS: Constitutional: No fevers, chills, or sweats, no generalized fatigue, change in appetite Eyes: No visual changes, double vision, eye pain Ear, nose and throat: No hearing loss, ear pain, nasal congestion, sore throat Cardiovascular: No chest pain, palpitations Respiratory:  No shortness of breath at rest or with exertion, wheezes GastrointestinaI: No nausea, vomiting, diarrhea, abdominal pain, fecal incontinence Genitourinary:  No dysuria, urinary retention or frequency Musculoskeletal:  No neck pain, back pain Integumentary: No rash, pruritus, skin lesions Neurological: as above Psychiatric: No depression, insomnia, anxiety Endocrine: No palpitations, fatigue, diaphoresis, mood swings,  change in appetite, change in weight, increased thirst Hematologic/Lymphatic:  No purpura, petechiae. Allergic/Immunologic: no itchy/runny eyes, nasal congestion, recent allergic reactions, rashes  PHYSICAL EXAM: Vitals:   01/04/18 1023  BP: 130/76  Pulse: 78  SpO2: 99%   General: No acute distress.  Patient appears well-groomed.  Head:  Normocephalic/atraumatic Eyes:  fundi examined but not visualized Neck: supple, no paraspinal tenderness, full range of motion Back: No paraspinal tenderness Heart: regular rate and rhythm Lungs: Clear to auscultation bilaterally. Vascular: No carotid bruits. Neurological Exam: Mental status: alert and oriented to person, place, and time, recent and remote memory intact, fund of knowledge intact, attention and concentration intact, speech fluent and not dysarthric, language intact. Cranial nerves: CN I: not tested CN II: pupils equal, round and reactive to light, monocular vision loss in left eye CN III, IV, VI:  full range of motion, no nystagmus, no ptosis CN V: facial sensation intact CN VII: upper and lower face symmetric CN VIII: hearing intact CN IX, X: gag intact, uvula midline CN XI: sternocleidomastoid and trapezius muscles intact CN XII: tongue midline Bulk & Tone: normal, no fasciculations. Motor:  5/5 throughout  Sensation: temperature and vibration sensation intact. Deep Tendon Reflexes:  2+ throughout, toes downgoing.  Finger to nose testing:  Without dysmetria.  Heel to shin:  Without dysmetria.  Gait:  Normal station and stride.  Able to turn and tandem walk. Romberg negative.  IMPRESSION: Left hemispheric watershed infarct secondary to left carotid artery stenosis Left central retinal artery occlusion secondary to left carotid artery stenosis Left carotid artery stenosis s/p endarterectomy HTN HLD CAD/MI s/p CABG x4 Tobacco use disorder  PLAN: 1.  Continue Plavix 75mg  daily 2.  Continue atorvastatin 80mg  daily (LDL  at goal less than 70) 3.  Continue blood pressure control 4.  Refrain from cocaine use (patient has not used since stroke) 5.  Mediterranean diet 6.  Tobacco cessation counseling (CPT 99406):  Tobacco with history of CAD, stroke, or cancer  - Currently smoking 10 cigarettes/day   - Patient was informed of the dangers of tobacco abuse including stroke, cancer, and MI, as well as benefits of tobacco cessation. - Patient iswilling to  quit at this time.  He is currently using the patch and will soon start Chantix - Approximately 5 mins were spent counseling patient cessation techniques. We discussed various methods to help quit smoking, including deciding on a date to quit, joining a support group, pharmacological agents- nicotine gum/patch/lozenges, chantix.  - I will reassess his progress at the next follow-up visit 7.  Follow up in 4 months.  Thank you for allowing me to take part in the care of this patient.  Metta Clines, DO  CC:  Johnny Boast, FNP

## 2018-01-04 NOTE — Patient Instructions (Signed)
1.  Continue Plavix 75mg  daily 2.  Continue atorvastatin 80mg  daily 3.  Continue blood pressure control 4.  Try to continue trying to quit smoking 5.  Refrain from cocaine 6.  Follow Mediterranean diet (see below) 7.  Follow up in 4 months   Mediterranean Diet A Mediterranean diet refers to food and lifestyle choices that are based on the traditions of countries located on the [employer]. This way of eating has been shown to help prevent certain conditions and improve outcomes for people who have chronic diseases, like kidney disease and heart disease. What are tips for following this plan? Lifestyle  Cook and eat meals together with your family, when possible.  Drink enough fluid to keep your urine clear or pale yellow.  Be physically active every day. This includes: ? Aerobic exercise like running or swimming. ? Leisure activities like gardening, walking, or housework.  Get 7-8 hours of sleep each night.  If recommended by your health care provider, drink red wine in moderation. This means 1 glass a day for nonpregnant women and 2 glasses a day for men. A glass of wine equals 5 oz (150 mL). Reading food labels  Check the serving size of packaged foods. For foods such as rice and pasta, the serving size refers to the amount of cooked product, not dry.  Check the total fat in packaged foods. Avoid foods that have saturated fat or trans fats.  Check the ingredients list for added sugars, such as corn syrup. Shopping  At the grocery store, buy most of your food from the areas near the walls of the store. This includes: ? Fresh fruits and vegetables (produce). ? Grains, beans, nuts, and seeds. Some of these may be available in unpackaged forms or large amounts (in bulk). ? Fresh seafood. ? Poultry and eggs. ? Low-fat dairy products.  Buy whole ingredients instead of prepackaged foods.  Buy fresh fruits and vegetables in-season from local farmers markets.  Buy frozen  fruits and vegetables in resealable bags.  If you do not have access to quality fresh seafood, buy precooked frozen shrimp or canned fish, such as tuna, salmon, or sardines.  Buy small amounts of raw or cooked vegetables, salads, or olives from the deli or salad bar at your store.  Stock your pantry so you always have certain foods on hand, such as olive oil, canned tuna, canned tomatoes, rice, pasta, and beans. Cooking  Cook foods with extra-virgin olive oil instead of using butter or other vegetable oils.  Have meat as a side dish, and have vegetables or grains as your main dish. This means having meat in small portions or adding small amounts of meat to foods like pasta or stew.  Use beans or vegetables instead of meat in common dishes like chili or lasagna.  Experiment with different cooking methods. Try roasting or broiling vegetables instead of steaming or sauteing them.  Add frozen vegetables to soups, stews, pasta, or rice.  Add nuts or seeds for added healthy fat at each meal. You can add these to yogurt, salads, or vegetable dishes.  Marinate fish or vegetables using olive oil, lemon juice, garlic, and fresh herbs. Meal planning  Plan to eat 1 vegetarian meal one day each week. Try to work up to 2 vegetarian meals, if possible.  Eat seafood 2 or more times a week.  Have healthy snacks readily available, such as: ? Vegetable sticks with hummus. ? [place] yogurt. ? Fruit and nut trail mix.  Eat  balanced meals throughout the week. This includes: ? Fruit: 2-3 servings a day ? Vegetables: 4-5 servings a day ? Low-fat dairy: 2 servings a day ? Fish, poultry, or lean meat: 1 serving a day ? Beans and legumes: 2 or more servings a week ? Nuts and seeds: 1-2 servings a day ? Whole grains: 6-8 servings a day ? Extra-virgin olive oil: 3-4 servings a day  Limit red meat and sweets to only a few servings a month What are my food choices?  Mediterranean  diet ? Recommended ? Grains: Whole-grain pasta. Brown rice. Bulgar wheat. Polenta. Couscous. Whole-wheat bread. [place]. ? Vegetables: Artichokes. Beets. Broccoli. Cabbage. Carrots. Eggplant. Green beans. Chard. Kale. Spinach. Onions. Leeks. Peas. Squash. Tomatoes. Peppers. Radishes. ? Fruits: Apples. Apricots. Avocado. Berries. Bananas. Cherries. Dates. Figs. Grapes. Lemons. Melon. Oranges. Peaches. Plums. Pomegranate. ? Meats and other protein foods: Beans. Almonds. Sunflower seeds. Pine nuts. Peanuts. [place]. Salmon. Scallops. Shrimp. [place]. Tilapia. Clams. Oysters. Eggs. ? Dairy: Low-fat milk. Cheese. Greek yogurt. ? Beverages: Water. Red wine. Herbal tea. ? Fats and oils: Extra virgin olive oil. Avocado oil. Grape seed oil. ? Sweets and desserts: [place] yogurt with honey. Baked apples. Poached pears. Trail mix. ? Seasoning and other foods: Basil. Cilantro. Coriander. Cumin. Mint. Parsley. Sage. Rosemary. Tarragon. Garlic. Oregano. Thyme. Pepper. Balsalmic vinegar. Tahini. Hummus. Tomato sauce. Olives. Mushrooms. ? Limit these ? Grains: Prepackaged pasta or rice dishes. Prepackaged cereal with added sugar. ? Vegetables: Deep fried potatoes (french fries). ? Fruits: Fruit canned in syrup. ? Meats and other protein foods: Beef. Pork. Lamb. Poultry with skin. Hot dogs. [place] Salines. ? Dairy: Ice cream. Sour cream. Whole milk. ? Beverages: Juice. Sugar-sweetened soft drinks. Beer. Liquor and spirits. ? Fats and oils: Butter. Canola oil. Vegetable oil. Beef fat (tallow). Lard. ? Sweets and desserts: Cookies. Cakes. Pies. Candy. ? Seasoning and other foods: Mayonnaise. Premade sauces and marinades. ? The items listed may not be a complete list. Talk with your dietitian about what dietary choices are right for you. Summary  The Mediterranean diet includes both food and lifestyle choices.  Eat a variety of fresh fruits and vegetables, beans, nuts, seeds, and whole grains.  Limit the amount of red  meat and sweets that you eat.  Talk with your health care provider about whether it is safe for you to drink red wine in moderation. This means 1 glass a day for nonpregnant women and 2 glasses a day for men. A glass of wine equals 5 oz (150 mL). This information is not intended to replace advice given to you by your health care provider. Make sure you discuss any questions you have with your health care provider. Document Released: 01/21/2016 Document Revised: 02/23/2016 Document Reviewed: 01/21/2016 Elsevier Interactive Patient Education  [employer].

## 2018-01-24 ENCOUNTER — Other Ambulatory Visit: Payer: Self-pay

## 2018-01-24 MED ORDER — PANTOPRAZOLE SODIUM 40 MG PO TBEC: 40.0000 mg | DELAYED_RELEASE_TABLET | Freq: Every day | ORAL | 1 refills | Status: DC

## 2018-01-24 MED ORDER — CLOPIDOGREL BISULFATE 75 MG PO TABS: 75.0000 mg | ORAL_TABLET | Freq: Every day | ORAL | 1 refills | Status: DC

## 2018-01-24 MED FILL — PANTOPRAZOLE SOD DR 40 MG T: 40 | 30 days supply | Qty: 30 | Fill #0

## 2018-01-24 MED FILL — CLOPIDOGREL 75 MG TABLET: 75 | 30 days supply | Qty: 30 | Fill #0

## 2018-02-08 MED FILL — HYDROCHLOROTHIAZIDE 12.5 MG: 12.5 | 30 days supply | Qty: 30 | Fill #1

## 2018-02-15 MED FILL — $CHANTIX STARTING MONTH BOX: 0.5 MG X 11 | 28 days supply | Qty: 53 | Fill #0

## 2018-02-27 MED FILL — CLOPIDOGREL 75 MG TABLET: 75 | 30 days supply | Qty: 30 | Fill #1

## 2018-02-27 MED FILL — PANTOPRAZOLE SOD DR 40 MG T: 40 | 30 days supply | Qty: 30 | Fill #1

## 2018-02-27 MED FILL — ALBUTEROL SULFATE HFA 108 (: 108 (90 BAS | 25 days supply | Qty: 9 | Fill #1

## 2018-02-27 MED FILL — !BREO ELLIPTA 200-25 MCG: 200-25 | 30 days supply | Qty: 30 | Fill #0

## 2018-03-21 MED FILL — HYDROCHLOROTHIAZIDE 12.5 MG: 12.5 | 30 days supply | Qty: 30 | Fill #2

## 2018-03-21 MED FILL — $VENTOLIN HFA 18G INHALER: 108 (90 BAS | 25 days supply | Qty: 18 | Fill #0

## 2018-04-04 ENCOUNTER — Other Ambulatory Visit: Payer: Self-pay | Admitting: Family Medicine

## 2018-04-04 DIAGNOSIS — J449 Chronic obstructive pulmonary disease, unspecified: Secondary | ICD-10-CM

## 2018-04-04 MED FILL — CLOPIDOGREL 75 MG TABLET: 75 | 30 days supply | Qty: 30 | Fill #0

## 2018-04-04 MED FILL — ATORVASTATIN 80 MG TABLET: 80 | 30 days supply | Qty: 30 | Fill #4

## 2018-04-04 MED FILL — !PROVENTIL HFA 90 MCG INH: 108 (90 BAS | 25 days supply | Qty: 1 | Fill #0

## 2018-04-04 MED FILL — PANTOPRAZOLE SOD DR 40 MG T: 40 | 30 days supply | Qty: 30 | Fill #0

## 2018-05-01 ENCOUNTER — Other Ambulatory Visit: Payer: Self-pay | Admitting: Family Medicine

## 2018-05-01 DIAGNOSIS — J449 Chronic obstructive pulmonary disease, unspecified: Secondary | ICD-10-CM

## 2018-05-01 MED FILL — HYDROCHLOROTHIAZIDE 12.5 MG: 12.5 | 30 days supply | Qty: 30 | Fill #3

## 2018-05-01 MED FILL — !BREO ELLIPTA 200-25 MCG: 200-25 | 30 days supply | Qty: 30 | Fill #1

## 2018-05-07 MED FILL — CLOPIDOGREL 75 MG TABLET: 75 | 30 days supply | Qty: 30 | Fill #1

## 2018-05-07 MED FILL — ATORVASTATIN 80 MG TABLET: 80 | 30 days supply | Qty: 30 | Fill #5

## 2018-05-07 MED FILL — PANTOPRAZOLE SOD DR 40 MG T: 40 | 30 days supply | Qty: 30 | Fill #1

## 2018-05-09 ENCOUNTER — Other Ambulatory Visit: Payer: Self-pay | Admitting: Family Medicine

## 2018-05-09 DIAGNOSIS — J449 Chronic obstructive pulmonary disease, unspecified: Secondary | ICD-10-CM

## 2018-05-14 MED FILL — $VENTOLIN HFA 18G INHALER: 108 (90 BAS | 25 days supply | Qty: 18 | Fill #0

## 2018-06-11 ENCOUNTER — Other Ambulatory Visit: Payer: Self-pay | Admitting: Family Medicine

## 2018-06-11 DIAGNOSIS — J449 Chronic obstructive pulmonary disease, unspecified: Secondary | ICD-10-CM

## 2018-06-11 MED FILL — $BREO ELLIPTA 200-25 MCG IN: 200-25 | 90 days supply | Qty: 90 | Fill #2

## 2018-06-11 MED FILL — $VENTOLIN HFA 18G INHALER: 108 (90 BAS | 18 days supply | Qty: 18 | Fill #0

## 2018-06-11 MED FILL — PANTOPRAZOLE SOD DR 40 MG T: 40 | 30 days supply | Qty: 30 | Fill #0

## 2018-06-11 MED FILL — HYDROCHLOROTHIAZIDE 12.5 MG: 12.5 | 30 days supply | Qty: 30 | Fill #4

## 2018-06-11 MED FILL — CLOPIDOGREL 75 MG TABLET: 75 | 30 days supply | Qty: 30 | Fill #0

## 2018-06-11 MED FILL — ATORVASTATIN 80 MG TABLET: 80 | 30 days supply | Qty: 30 | Fill #0

## 2018-06-11 NOTE — Progress Notes (Deleted)
NEUROLOGY FOLLOW UP OFFICE NOTE  [facility] TISSUE [id number]  HISTORY OF PRESENT ILLNESS: [name] is a 62 year old right-handed male with hypertension, hyperlipidemia, CAD with history of MI S/P CABG, COPD, substance abuse (cocaine and marijuana), tobacco abuse who follows up for stroke.  UPDATE:  Current medications: Plavix 75 mg daily, atorvastatin 80 mg daily, hydrochlorothiazide, Chantix  ***  HISTORY:  He was admitted to [facility] on 11/04/2017 after he woke up with total vision loss in the left eye.  There was no associated headache or ocular pain.  CT of the brain showed no acute abnormalities.  CTA of the head and neck showed high-grade stenosis of the mid left common carotid artery greater than 75%.  Soft plaque noted in the proximal left internal carotid artery without focal stenosis.  No focal stenosis of the ophthalmic segment of the left internal carotid artery.  MRI of the brain showed multifocal subcentimeter watershed infarcts in the left hemisphere.  LDL was 42.  Hemoglobin A1c was 5.2.  Echocardiogram showed ejection fraction 60-65%.  He underwent left carotid endarterectomy on 11/06/2017.  Hospitalization was complicated by acute blood loss due to GI bleed.  Endoscopy revealed no active bleeding.  Aspirin was changed to Plavix 75 mg daily and he was continued on atorvastatin 80 mg daily.  PAST MEDICAL HISTORY: Past Medical History:  Diagnosis Date  . Anginal pain ([place])   . CAD (coronary artery disease)    4V CABG 10/31/13  . Cocaine abuse ([place])   . COPD (chronic obstructive pulmonary disease) ([place])   . GERD (gastroesophageal reflux disease)   . H/O: eczema    lower extremities  . H/O: GI bleed    a. 62 y/o ago, r/t ulcer. Denies recent GIB.  Marland Kitchen High cholesterol   . Hypertension   . Myocardial infarction ([place])   . Perforation of duodenal ulcer ([place])    a. 1970s - s/p surgery.   . Shortness of breath dyspnea   . Tobacco abuse      MEDICATIONS: Current Outpatient Medications on File Prior to Visit  Medication Sig Dispense Refill  . atorvastatin (LIPITOR) 80 MG tablet Take 1 tablet (80 mg total) by mouth daily at 6 PM. 30 tablet 1  . CHANTIX 0.5 MG tablet TAKE 1 1/2 MG TABLET BY MOUTH ONCE DAILY FOR 3 DAYS, INCREASE TO ONE 1/2 MG TABLET TWO TIMES DAILY FOR 4 DAYS, THEN INCREASE TO ONE 1 MG TWO  0  . clopidogrel (PLAVIX) 75 MG tablet TAKE 1 TABLET BY MOUTH DAILY. 30 tablet 1  . fluticasone (FLONASE) 50 MCG/ACT nasal spray Place 2 sprays into both nostrils as needed for allergies or rhinitis.    . fluticasone furoate-vilanterol (BREO ELLIPTA) 200-25 MCG/INH AEPB Inhale 1 puff into the lungs daily. 180 each 3  . hydrochlorothiazide (MICROZIDE) 12.5 MG capsule Take 1 capsule (12.5 mg total) by mouth daily. 90 capsule 1  . Multiple Vitamin (MULTIVITAMIN WITH MINERALS) TABS tablet Take 1 tablet by mouth daily. 30 tablet 1  . nicotine (NICODERM CQ - DOSED IN MG/24 HOURS) 14 mg/24hr patch Place 1 patch (14 mg total) onto the skin daily. 28 patch 0  . pantoprazole (PROTONIX) 40 MG tablet TAKE 1 TABLET BY MOUTH DAILY. 30 tablet 1  . PROVENTIL HFA 108 (90 Base) MCG/ACT inhaler INHALE 2 PUFFS INTO THE LUNGS EVERY 6 HOURS AS NEEDED FOR WHEEZING OR SHORTNESS OF BREATH 1 g 0  . PROVENTIL HFA 108 (90 Base) MCG/ACT inhaler INHALE 2  PUFFS INTO THE LUNGS EVERY 6 HOURS AS NEEDED FOR WHEEZING OR SHORTNESS OF BREATH 1 g 0  . triamcinolone (KENALOG) 0.025 % ointment APPLY 1 APPLICATION TOPICALLY 2 TIMES DAILY. (Patient taking differently: APPLY 1 APPLICATION TOPICALLY 2 TIMES qd prn rash) 30 g 2  . varenicline (CHANTIX STARTING MONTH PAK) 0.5 MG X 11 & 1 MG X 42 tablet Take 1 0.5 mg tablet by mouth once daily for 3 days,  increase to one 0.5 mg tablet bid for 4 days, then increase to one 1 mg BID 53 tablet 0   No current facility-administered medications on file prior to visit.     ALLERGIES: No Known Allergies  FAMILY HISTORY: Family  History  Problem Relation Age of Onset  . Lung cancer Mother   . Alzheimer's disease Mother   . Lung cancer Father   . HIV/AIDS Brother   . Obesity Brother   . Lung cancer Unknown     SOCIAL HISTORY: Social History   Socioeconomic History  . Marital status: Single    Spouse name: Not on file  . Number of children: 1  . Years of education: Not on file  . Highest education level: Bachelor's degree (e.g., BA, AB, BS)  Occupational History  . Occupation: retired  Scientific laboratory technician  . Financial resource strain: Not on file  . Food insecurity:    Worry: Not on file    Inability: Not on file  . Transportation needs:    Medical: Not on file    Non-medical: Not on file  Tobacco Use  . Smoking status: Current Every Day Smoker    Packs/day: 0.50    Years: 40.00    Pack years: 20.00    Types: Cigarettes  . Smokeless tobacco: Never Used  . Tobacco comment: starting Chantix 01/04/18  Substance and Sexual Activity  . Alcohol use: Yes    Alcohol/week: 4.0 standard drinks    Types: 4 Standard drinks or equivalent per week    Comment: vodka  . Drug use: Yes    Types: Marijuana, Cocaine    Comment: "about 1 marijuana cigarette a week"  . Sexual activity: Yes    Birth control/protection: None  Lifestyle  . Physical activity:    Days per week: Not on file    Minutes per session: Not on file  . Stress: Not on file  Relationships  . Social connections:    Talks on phone: Not on file    Gets together: Not on file    Attends religious service: Not on file    Active member of club or organization: Not on file    Attends meetings of clubs or organizations: Not on file    Relationship status: Not on file  . Intimate partner violence:    Fear of current or ex partner: Not on file    Emotionally abused: Not on file    Physically abused: Not on file    Forced sexual activity: Not on file  Other Topics Concern  . Not on file  Social History Narrative   Pt is right-handed. He lives alone  in a one story house. He drinks 2 cups coffee a day, and a liter of ginger-ale. He walks 4 x a week.    REVIEW OF SYSTEMS: Constitutional: No fevers, chills, or sweats, no generalized fatigue, change in appetite Eyes: No visual changes, double vision, eye pain Ear, nose and throat: No hearing loss, ear pain, nasal congestion, sore throat Cardiovascular: No chest pain, palpitations  Respiratory:  No shortness of breath at rest or with exertion, wheezes GastrointestinaI: No nausea, vomiting, diarrhea, abdominal pain, fecal incontinence Genitourinary:  No dysuria, urinary retention or frequency Musculoskeletal:  No neck pain, back pain Integumentary: No rash, pruritus, skin lesions Neurological: as above Psychiatric: No depression, insomnia, anxiety Endocrine: No palpitations, fatigue, diaphoresis, mood swings, change in appetite, change in weight, increased thirst Hematologic/Lymphatic:  No purpura, petechiae. Allergic/Immunologic: no itchy/runny eyes, nasal congestion, recent allergic reactions, rashes  PHYSICAL EXAM: *** General: No acute distress.  Patient appears ***-groomed.  *** body habitus. Head:  Normocephalic/atraumatic Eyes:  Fundi examined but not visualized Neck: supple, no paraspinal tenderness, full range of motion Heart:  Regular rate and rhythm Lungs:  Clear to auscultation bilaterally Back: No paraspinal tenderness Neurological Exam: alert and oriented to person, place, and time. Attention span and concentration intact, recent and remote memory intact, fund of knowledge intact.  Speech fluent and not dysarthric, language intact.  CN II-XII intact. Bulk and tone normal, muscle strength 5/5 throughout.  Sensation to light touch, temperature and vibration intact.  Deep tendon reflexes 2+ throughout, toes downgoing.  Finger to nose and heel to shin testing intact.  Gait normal, Romberg negative.  IMPRESSION: 1.  Left hemispheric watershed infarct secondary to left carotid  artery stenosis 2.  Left central retinal artery occlusion secondary to left carotid artery stenosis 3.  Left carotid artery stenosis status post endarterectomy 4.  Hypertension 5.  Hyperlipidemia Next.  Coronary artery disease status post MI and CABG x4 6.  Tobacco use disorder  PLAN: 1.  Plavix 75 mg daily for secondary stroke prevention 2.  Atorvastatin 80 mg daily (LDL goal less than 70) 3.  Blood pressure control 4.  Tobacco use and substance abuse cessation 5.  Mediterranean diet  Metta Clines, DO  CC: ***

## 2018-06-12 ENCOUNTER — Ambulatory Visit: Payer: Self-pay | Admitting: Neurology

## 2018-06-21 ENCOUNTER — Ambulatory Visit: Payer: Self-pay | Admitting: Family Medicine

## 2018-07-04 ENCOUNTER — Ambulatory Visit: Payer: Self-pay | Admitting: Family Medicine

## 2018-07-13 ENCOUNTER — Other Ambulatory Visit: Payer: Self-pay | Admitting: Family Medicine

## 2018-07-13 DIAGNOSIS — J449 Chronic obstructive pulmonary disease, unspecified: Secondary | ICD-10-CM

## 2018-07-13 MED FILL — CLOPIDOGREL 75 MG TABLET: 75 | 30 days supply | Qty: 30 | Fill #1

## 2018-07-13 MED FILL — HYDROCHLOROTHIAZIDE 12.5 MG: 12.5 | 30 days supply | Qty: 30 | Fill #5

## 2018-07-13 MED FILL — ATORVASTATIN 80 MG TABLET: 80 | 30 days supply | Qty: 30 | Fill #1

## 2018-07-13 MED FILL — PANTOPRAZOLE SOD DR 40 MG T: 40 | 30 days supply | Qty: 30 | Fill #1

## 2018-07-18 ENCOUNTER — Other Ambulatory Visit: Payer: Self-pay | Admitting: Family Medicine

## 2018-07-18 DIAGNOSIS — J449 Chronic obstructive pulmonary disease, unspecified: Secondary | ICD-10-CM

## 2018-07-23 ENCOUNTER — Ambulatory Visit (INDEPENDENT_AMBULATORY_CARE_PROVIDER_SITE_OTHER): Payer: Self-pay | Admitting: Family Medicine

## 2018-07-23 ENCOUNTER — Encounter: Payer: Self-pay | Admitting: Family Medicine

## 2018-07-23 VITALS — BP 113/86 | HR 88 | Temp 98.6°F | Resp 22 | Ht 73.0 in | Wt 184.0 lb

## 2018-07-23 DIAGNOSIS — J440 Chronic obstructive pulmonary disease with acute lower respiratory infection: Secondary | ICD-10-CM

## 2018-07-23 DIAGNOSIS — I214 Non-ST elevation (NSTEMI) myocardial infarction: Secondary | ICD-10-CM

## 2018-07-23 DIAGNOSIS — J449 Chronic obstructive pulmonary disease, unspecified: Secondary | ICD-10-CM

## 2018-07-23 DIAGNOSIS — Z720 Tobacco use: Secondary | ICD-10-CM

## 2018-07-23 DIAGNOSIS — I10 Essential (primary) hypertension: Secondary | ICD-10-CM

## 2018-07-23 DIAGNOSIS — J209 Acute bronchitis, unspecified: Secondary | ICD-10-CM

## 2018-07-23 LAB — POCT URINALYSIS DIPSTICK
Bilirubin, UA: NEGATIVE
Blood, UA: NEGATIVE
Glucose, UA: NEGATIVE
Ketones, UA: NEGATIVE
Nitrite, UA: NEGATIVE
Protein, UA: POSITIVE — AB
Spec Grav, UA: 1.02 (ref 1.010–1.025)
Urobilinogen, UA: 0.2 E.U./dL
pH, UA: 5.5 (ref 5.0–8.0)

## 2018-07-23 MED ORDER — HYDROCHLOROTHIAZIDE 12.5 MG PO CAPS: 12.5000 mg | ORAL_CAPSULE | Freq: Every day | ORAL | 1 refills | Status: DC

## 2018-07-23 MED ORDER — METHYLPREDNISOLONE 4 MG PO TBPK: ORAL_TABLET | ORAL | 0 refills | Status: DC

## 2018-07-23 MED ORDER — CLOPIDOGREL BISULFATE 75 MG PO TABS: 75.0000 mg | ORAL_TABLET | Freq: Every day | ORAL | 1 refills | Status: DC

## 2018-07-23 MED ORDER — METHYLPREDNISOLONE SODIUM SUCC 125 MG IJ SOLR
125.0000 mg | Freq: Once | INTRAMUSCULAR | Status: AC
  Administered 2018-07-23: 125 mg via INTRAMUSCULAR

## 2018-07-23 MED ORDER — ALBUTEROL SULFATE HFA 108 (90 BASE) MCG/ACT IN AERS: INHALATION_SPRAY | RESPIRATORY_TRACT | 0 refills | Status: DC

## 2018-07-23 MED ORDER — VARENICLINE TARTRATE 0.5 MG X 11 & 1 MG X 42 PO MISC: ORAL | 0 refills | Status: DC

## 2018-07-23 MED ORDER — AMOXICILLIN-POT CLAVULANATE 875-125 MG PO TABS: 1.0000 | ORAL_TABLET | Freq: Two times a day (BID) | ORAL | 0 refills | Status: DC

## 2018-07-23 MED ORDER — IPRATROPIUM BROMIDE 0.02 % IN SOLN
0.5000 mg | Freq: Once | RESPIRATORY_TRACT | Status: AC
  Administered 2018-07-23: 0.5 mg via RESPIRATORY_TRACT

## 2018-07-23 MED ORDER — ATORVASTATIN CALCIUM 80 MG PO TABS: 80.0000 mg | ORAL_TABLET | Freq: Every day | ORAL | 1 refills | Status: DC

## 2018-07-23 MED ORDER — ALBUTEROL SULFATE (2.5 MG/3ML) 0.083% IN NEBU
2.5000 mg | INHALATION_SOLUTION | Freq: Once | RESPIRATORY_TRACT | Status: AC
  Administered 2018-07-23: 2.5 mg via RESPIRATORY_TRACT

## 2018-07-23 MED ORDER — ATORVASTATIN CALCIUM 80 MG PO TABS: 80.0000 mg | ORAL_TABLET | Freq: Every day | ORAL | 11 refills | Status: DC

## 2018-07-23 MED FILL — METHYLPREDNISOLONE 4 MG TBP: 4 | 6 days supply | Qty: 21 | Fill #0

## 2018-07-23 MED FILL — AMOX-CLAV 875-125 MG TABLET: 875-125 | 10 days supply | Qty: 20 | Fill #0

## 2018-07-23 MED FILL — $VENTOLIN HFA 18G INHALER: 108 (90 BAS | 25 days supply | Qty: 18 | Fill #0

## 2018-07-23 NOTE — Progress Notes (Signed)
[facility]   Progress Note: General Provider: Lanae Boast, FNP  SUBJECTIVE:   [name] is a 63 y.o. male who  has a past medical history of Anginal pain ([place]), CAD (coronary artery disease), Cocaine abuse ([place]), COPD (chronic obstructive pulmonary disease) ([place]), GERD (gastroesophageal reflux disease), H/O: eczema, H/O: GI bleed, High cholesterol, Hypertension, Myocardial infarction ([place]), Perforation of duodenal ulcer ([place]), Shortness of breath dyspnea, and Tobacco abuse.. Patient presents today for Hypertension and Shortness of Breath   Patient reports shortness of breath x 1 month. He states that he called EMS x 2 and was given breathing treatments with temporary relief.  Patient with a history of COPD.  He states that he ran out of albuterol inhaler. He states that he has sick contacts in his home. Denies fever or chills. Patient reports a 10 pound weight loss.  Review of Systems  Constitutional: Negative.   HENT: Negative.   Eyes: Negative.   Respiratory: Positive for cough, sputum production, shortness of breath and wheezing. Negative for hemoptysis.   Cardiovascular: Negative.   Gastrointestinal: Negative.   Genitourinary: Negative.   Musculoskeletal: Negative.   Skin: Negative.   Neurological: Negative.   Psychiatric/Behavioral: Negative.      OBJECTIVE: BP 113/86 (BP Location: Left Arm, Patient Position: Sitting, Cuff Size: Normal)   Pulse 88   Temp 98.6 F (37 C) (Oral)   Resp (!) 22   Ht 6\' 1"  (1.854 m)   Wt 184 lb (83.5 kg)   SpO2 100%   BMI 24.28 kg/m   Wt Readings from Last 3 Encounters:  07/23/18 184 lb (83.5 kg)  01/04/18 193 lb (87.5 kg)  01/01/18 194 lb (88 kg)     Physical Exam Vitals signs and nursing note reviewed.  Constitutional:      General: He is not in acute distress.    Appearance: He is well-developed.  HENT:     Head: Normocephalic and atraumatic.     Mouth/Throat:     Mouth:  Mucous membranes are moist.     Pharynx: Oropharynx is clear.  Eyes:     Extraocular Movements: Extraocular movements intact.     Conjunctiva/sclera: Conjunctivae normal.     Pupils: Pupils are equal, round, and reactive to light.  Neck:     Musculoskeletal: Normal range of motion and neck supple.  Cardiovascular:     Rate and Rhythm: Normal rate and regular rhythm.     Heart sounds: Normal heart sounds.  Pulmonary:     Effort: Pulmonary effort is normal. No respiratory distress.     Breath sounds: Examination of the right-upper field reveals wheezing. Examination of the left-upper field reveals wheezing. Wheezing present. No rhonchi or rales.  Abdominal:     General: Bowel sounds are normal. There is no distension.     Palpations: Abdomen is soft.  Musculoskeletal: Normal range of motion.  Skin:    General: Skin is warm and dry.  Neurological:     Mental Status: He is alert and oriented to person, place, and time.  Psychiatric:        Mood and Affect: Mood normal.        Behavior: Behavior normal.        Thought Content: Thought content normal.     ASSESSMENT/PLAN:  1. Essential hypertension Continue with current medications.  - Urinalysis Dipstick - atorvastatin (LIPITOR) 80 MG tablet; Take 1 tablet (80 mg total) by mouth daily at 6 PM.  Dispense: 30 tablet; Refill: 1 - hydrochlorothiazide (MICROZIDE) 12.5 MG capsule; Take 1 capsule (12.5 mg total) by mouth daily.  Dispense: 90 capsule; Refill: 1  2. Chronic obstructive pulmonary disease, unspecified COPD type (HCC) - albuterol (VENTOLIN HFA) 108 (90 Base) MCG/ACT inhaler; INHALE 2 PUFFS INTO THE LUNGS EVERY 6 HOURS AS NEEDED FOR WHEEZING OR SHORTNESS OF BREATH  Dispense: 18 g; Refill: 0  3. Acute bronchitis with chronic obstructive pulmonary disease (COPD) ([place]) Patient expressed relief s/p neb treatment. Will start augmentin and medrol outpatient.  - albuterol (VENTOLIN HFA) 108 (90 Base) MCG/ACT inhaler; INHALE 2 PUFFS  INTO THE LUNGS EVERY 6 HOURS AS NEEDED FOR WHEEZING OR SHORTNESS OF BREATH  Dispense: 18 g; Refill: 0 - methylPREDNISolone sodium succinate (SOLU-MEDROL) 125 mg/2 mL injection 125 mg - amoxicillin-clavulanate (AUGMENTIN) 875-125 MG tablet; Take 1 tablet by mouth 2 (two) times daily.  Dispense: 20 tablet; Refill: 0 - methylPREDNISolone (MEDROL DOSEPAK) 4 MG TBPK tablet; Take as directed on pack.  Dispense: 21 tablet; Refill: 0  4. Tobacco use Restart chantix. Encouraged smoking cessation.  - varenicline (CHANTIX STARTING MONTH PAK) 0.5 MG X 11 & 1 MG X 42 tablet; Take one 0.5 mg tablet by mouth once daily for 3 days, increase to one 0.5 mg tablet twice daily for 4 days, then increase to one 1 mgbid  Dispense: 53 tablet; Refill: 0  5. NSTEMI (non-ST elevated myocardial infarction) (HCC) - clopidogrel (PLAVIX) 75 MG tablet; Take 1 tablet (75 mg total) by mouth daily.  Dispense: 30 tablet; Refill: 1     Return in about 2 weeks (around 08/06/2018) for F/u COPD.    The patient was given clear instructions to go to ER or return to medical center if symptoms do not improve, worsen or new problems develop. The patient verbalized understanding and agreed with plan of care.   Ms. Doug Sou. Nathaneil Canary, FNP-BC [facility] [address] [place], [place] [address] [phone]

## 2018-07-23 NOTE — Patient Instructions (Signed)
Chronic Obstructive Pulmonary Disease Chronic obstructive pulmonary disease (COPD) is a long-term (chronic) lung problem. When you have COPD, it is hard for air to get in and out of your lungs. Usually the condition gets worse over time, and your lungs will never return to normal. There are things you can do to keep yourself as healthy as possible.  Your doctor may treat your condition with: ? Medicines. ? Oxygen. ? Lung surgery.  Your doctor may also recommend: ? Rehabilitation. This includes steps to make your body work better. It may involve a team of specialists. ? Quitting smoking, if you smoke. ? Exercise and changes to your diet. ? Comfort measures (palliative care). Follow these instructions at home: Medicines  Take over-the-counter and prescription medicines only as told by your doctor.  Talk to your doctor before taking any cough or allergy medicines. You may need to avoid medicines that cause your lungs to be dry. Lifestyle  If you smoke, stop. Smoking makes the problem worse. If you need help quitting, ask your doctor.  Avoid being around things that make your breathing worse. This may include smoke, chemicals, and fumes.  Stay active, but remember to rest as well.  Learn and use tips on how to relax.  Make sure you get enough sleep. Most adults need at least 7 hours of sleep every night.  Eat healthy foods. Eat smaller meals more often. Rest before meals. Controlled breathing Learn and use tips on how to control your breathing as told by your doctor. Try:  Breathing in (inhaling) through your nose for 1 second. Then, pucker your lips and breath out (exhale) through your lips for 2 seconds.  Putting one hand on your belly (abdomen). Breathe in slowly through your nose for 1 second. Your hand on your belly should move out. Pucker your lips and breathe out slowly through your lips. Your hand on your belly should move in as you breathe out.  Controlled coughing Learn  and use controlled coughing to clear mucus from your lungs. Follow these steps: 1. Lean your head a little forward. 2. Breathe in deeply. 3. Try to hold your breath for 3 seconds. 4. Keep your mouth slightly open while coughing 2 times. 5. Spit any mucus out into a tissue. 6. Rest and do the steps again 1 or 2 times as needed. General instructions  Make sure you get all the shots (vaccines) that your doctor recommends. Ask your doctor about a flu shot and a pneumonia shot.  Use oxygen therapy and pulmonary rehabilitation if told by your doctor. If you need home oxygen therapy, ask your doctor if you should buy a tool to measure your oxygen level (oximeter).  Make a COPD action plan with your doctor. This helps you to know what to do if you feel worse than usual.  Manage any other conditions you have as told by your doctor.  Avoid going outside when it is very hot, cold, or humid.  Avoid people who have a sickness you can catch (contagious).  Keep all follow-up visits as told by your doctor. This is important. Contact a doctor if:  You cough up more mucus than usual.  There is a change in the color or thickness of the mucus.  It is harder to breathe than usual.  Your breathing is faster than usual.  You have trouble sleeping.  You need to use your medicines more often than usual.  You have trouble doing your normal activities such as getting dressed   or walking around the house. Get help right away if:  You have shortness of breath while resting.  You have shortness of breath that stops you from: ? Being able to talk. ? Doing normal activities.  Your chest hurts for longer than 5 minutes.  Your skin color is more blue than usual.  Your pulse oximeter shows that you have low oxygen for longer than 5 minutes.  You have a fever.  You feel too tired to breathe normally. Summary  Chronic obstructive pulmonary disease (COPD) is a long-term lung problem.  The way your  lungs work will never return to normal. Usually the condition gets worse over time. There are things you can do to keep yourself as healthy as possible.  Take over-the-counter and prescription medicines only as told by your doctor.  If you smoke, stop. Smoking makes the problem worse. This information is not intended to replace advice given to you by your health care provider. Make sure you discuss any questions you have with your health care provider. Document Released: 11/16/2007 Document Revised: 07/04/2016 Document Reviewed: 07/04/2016 Elsevier Interactive Patient Education  2019 Elsevier Inc.  

## 2018-08-06 ENCOUNTER — Ambulatory Visit: Payer: Self-pay | Admitting: Family Medicine

## 2018-08-07 ENCOUNTER — Other Ambulatory Visit: Payer: Self-pay | Admitting: Pharmacist

## 2018-08-07 DIAGNOSIS — Z720 Tobacco use: Secondary | ICD-10-CM

## 2018-08-07 MED ORDER — VARENICLINE TARTRATE 1 MG PO TABS: 1.0000 mg | ORAL_TABLET | Freq: Two times a day (BID) | ORAL | 0 refills | Status: DC

## 2018-08-07 MED ORDER — VARENICLINE TARTRATE 0.5 MG PO TABS: ORAL_TABLET | ORAL | 0 refills | Status: DC

## 2018-08-07 MED FILL — !CHANTIX 1 MG TABLET: 1 | 30 days supply | Qty: 60 | Fill #0

## 2018-08-07 MED FILL — $CHANTIX 0.5 MG TABLET: 0.5 | 5 days supply | Qty: 11 | Fill #0

## 2018-08-08 ENCOUNTER — Ambulatory Visit (HOSPITAL_COMMUNITY)
Admission: RE | Admit: 2018-08-08 | Discharge: 2018-08-08 | Disposition: A | Discharge status: Home / Self Care | Payer: Self-pay | Source: Ambulatory Visit | Attending: Family Medicine | Admitting: Family Medicine

## 2018-08-08 ENCOUNTER — Ambulatory Visit (INDEPENDENT_AMBULATORY_CARE_PROVIDER_SITE_OTHER): Payer: Self-pay | Admitting: Family Medicine

## 2018-08-08 ENCOUNTER — Encounter: Payer: Self-pay | Admitting: Family Medicine

## 2018-08-08 VITALS — BP 131/66 | HR 85 | Temp 98.6°F | Resp 16 | Ht 73.0 in | Wt 192.0 lb

## 2018-08-08 DIAGNOSIS — F17209 Nicotine dependence, unspecified, with unspecified nicotine-induced disorders: Secondary | ICD-10-CM

## 2018-08-08 DIAGNOSIS — J449 Chronic obstructive pulmonary disease, unspecified: Secondary | ICD-10-CM | POA: Insufficient documentation

## 2018-08-08 MED ORDER — MONTELUKAST SODIUM 10 MG PO TABS: 10.0000 mg | ORAL_TABLET | Freq: Every day | ORAL | 3 refills | Status: DC

## 2018-08-08 MED ORDER — IPRATROPIUM BROMIDE 0.02 % IN SOLN
0.5000 mg | Freq: Once | RESPIRATORY_TRACT | Status: AC
  Administered 2018-08-08: 0.5 mg via RESPIRATORY_TRACT

## 2018-08-08 MED ORDER — FLUTICASONE FUROATE-VILANTEROL 200-25 MCG/INH IN AEPB: 1.0000 | INHALATION_SPRAY | Freq: Every day | RESPIRATORY_TRACT | 3 refills | Status: DC

## 2018-08-08 MED ORDER — ALBUTEROL SULFATE (2.5 MG/3ML) 0.083% IN NEBU
2.5000 mg | INHALATION_SOLUTION | Freq: Once | RESPIRATORY_TRACT | Status: AC
  Administered 2018-08-08: 2.5 mg via RESPIRATORY_TRACT

## 2018-08-08 MED FILL — $BREO ELLIPTA 200-25 MCG IN: 200-25 | 90 days supply | Qty: 180 | Fill #0

## 2018-08-08 MED FILL — MONTELUKAST SOD 10 MG TAB: 10 | 30 days supply | Qty: 30 | Fill #0

## 2018-08-08 NOTE — Patient Instructions (Signed)
COPD and Physical Activity  Chronic obstructive pulmonary disease (COPD) is a long-term (chronic) condition that affects the lungs. COPD is a general term that can be used to describe many different lung problems that cause lung swelling (inflammation) and limit airflow, including chronic bronchitis and emphysema.  The main symptom of COPD is shortness of breath, which makes it harder to do even simple tasks. This can also make it harder to exercise and be active. Talk with your health care provider about treatments to help you breathe better and actions you can take to prevent breathing problems during physical activity.  What are the benefits of exercising with COPD?  Exercising regularly is an important part of a healthy lifestyle. You can still exercise and do physical activities even though you have COPD. Exercise and physical activity improve your shortness of breath by increasing blood flow (circulation). This causes your heart to pump more oxygen through your body. Moderate exercise can improve your:  · Oxygen use.  · Energy level.  · Shortness of breath.  · Strength in your breathing muscles.  · Heart health.  · Sleep.  · Self-esteem and feelings of self-worth.  · Depression, stress, and anxiety levels.  Exercise can benefit everyone with COPD. The severity of your disease may affect how hard you can exercise, especially at first, but everyone can benefit. Talk with your health care provider about how much exercise is safe for you, and which activities and exercises are safe for you.  What actions can I take to prevent breathing problems during physical activity?  · Sign up for a pulmonary rehabilitation program. This type of program may include:  ? Education about lung diseases.  ? Exercise classes that teach you how to exercise and be more active while improving your breathing. This usually involves:  § Exercise using your lower extremities, such as a stationary bicycle.  § About 30 minutes of exercise, 2  to 5 times per week, for 6 to 12 weeks  § Strength training, such as push ups or leg lifts.  ? Nutrition education.  ? Group classes in which you can talk with others who also have COPD and learn ways to manage stress.  · If you use an oxygen tank, you should use it while you exercise. Work with your health care provider to adjust your oxygen for your physical activity. Your resting flow rate is different from your flow rate during physical activity.  · While you are exercising:  ? Take slow breaths.  ? Pace yourself and do not try to go too fast.  ? Purse your lips while breathing out. Pursing your lips is similar to a kissing or whistling position.  ? If doing exercise that uses a quick burst of effort, such as weight lifting:  § Breathe in before starting the exercise.  § Breathe out during the hardest part of the exercise (such as raising the weights).  Where to find support  You can find support for exercising with COPD from:  · Your health care provider.  · A pulmonary rehabilitation program.  · Your local health department or community health programs.  · Support groups, online or in-person. Your health care provider may be able to recommend support groups.  Where to find more information  You can find more information about exercising with COPD from:  · American Lung Association: lung.org.  · COPD Foundation: copdfoundation.org.  Contact a health care provider if:  · Your symptoms get worse.  · You   have chest pain.  · You have nausea.  · You have a fever.  · You have trouble talking or catching your breath.  · You want to start a new exercise program or a new activity.  Summary  · COPD is a general term that can be used to describe many different lung problems that cause lung swelling (inflammation) and limit airflow. This includes chronic bronchitis and emphysema.  · Exercise and physical activity improve your shortness of breath by increasing blood flow (circulation). This causes your heart to provide more  oxygen to your body.  · Contact your health care provider before starting any exercise program or new activity. Ask your health care provider what exercises and activities are safe for you.  This information is not intended to replace advice given to you by your health care provider. Make sure you discuss any questions you have with your health care provider.  Document Released: 06/22/2017 Document Revised: 06/22/2017 Document Reviewed: 06/22/2017  Elsevier Interactive Patient Education © 2019 Elsevier Inc.

## 2018-08-08 NOTE — Progress Notes (Signed)
Patient [facility]   Progress Note: General Provider: Lanae Boast, FNP  SUBJECTIVE:   [name] is a 63 y.o. male who  has a past medical history of Anginal pain ([place]), CAD (coronary artery disease), Cocaine abuse ([place]), COPD (chronic obstructive pulmonary disease) ([place]), GERD (gastroesophageal reflux disease), H/O: eczema, H/O: GI bleed, High cholesterol, Hypertension, Myocardial infarction ([place]), Perforation of duodenal ulcer ([place]), Shortness of breath dyspnea, and Tobacco abuse.. Patient presents today for COPD (2 week follow up )  Patient states that he felt relief with the steroid and antibiotic. The last 4 days, SOB has returned. He states that it takes him a while to get started in the morning and he feels better as the day progresses. He states that he will get insurance in May and would like to have a complete physical at that time.    Review of Systems  Constitutional: Negative.   HENT: Negative.   Eyes: Negative.   Respiratory: Positive for cough, sputum production and shortness of breath.   Cardiovascular: Negative.   Gastrointestinal: Negative.   Genitourinary: Negative.   Musculoskeletal: Negative.   Skin: Negative.   Neurological: Negative.   Psychiatric/Behavioral: Negative.      OBJECTIVE: BP 131/66 (BP Location: Left Arm, Patient Position: Sitting, Cuff Size: Normal)   Pulse 85   Temp 98.6 F (37 C) (Oral)   Resp 16   Ht 6\' 1"  (1.854 m)   Wt 192 lb (87.1 kg)   SpO2 100%   BMI 25.33 kg/m   Wt Readings from Last 3 Encounters:  08/08/18 192 lb (87.1 kg)  07/23/18 184 lb (83.5 kg)  01/04/18 193 lb (87.5 kg)     Physical Exam Vitals signs and nursing note reviewed.  Constitutional:      General: He is not in acute distress.    Appearance: He is well-developed.  HENT:     Head: Normocephalic and atraumatic.  Eyes:     Conjunctiva/sclera: Conjunctivae normal.     Pupils: Pupils are equal, round, and  reactive to light.  Neck:     Musculoskeletal: Normal range of motion.  Cardiovascular:     Rate and Rhythm: Normal rate and regular rhythm.     Heart sounds: Normal heart sounds.  Pulmonary:     Effort: Pulmonary effort is normal. No respiratory distress.     Breath sounds: Decreased breath sounds present.  Abdominal:     General: Bowel sounds are normal. There is no distension.     Palpations: Abdomen is soft.  Musculoskeletal: Normal range of motion.  Skin:    General: Skin is warm and dry.  Neurological:     Mental Status: He is alert and oriented to person, place, and time.  Psychiatric:        Behavior: Behavior normal.        Thought Content: Thought content normal.     ASSESSMENT/PLAN:  1. Chronic obstructive pulmonary disease, unspecified COPD type ([place]) - DG Chest 2 View; Future - fluticasone furoate-vilanterol (BREO ELLIPTA) 200-25 MCG/INH AEPB; Inhale 1 puff into the lungs daily.  Dispense: 180 each; Refill: 3 - montelukast (SINGULAIR) 10 MG tablet; Take 1 tablet (10 mg total) by mouth at bedtime.  Dispense: 30 tablet; Refill: 3 - albuterol (PROVENTIL) (2.5 MG/3ML) 0.083% nebulizer solution 2.5 mg - ipratropium (ATROVENT) nebulizer solution 0.5 mg  2. Tobacco use disorder, continuous Smoking cessation instruction/counseling given:  counseled patient on the dangers of tobacco use, advised patient  to stop smoking, and reviewed strategies to maximize success   Return in about 3 months (around 11/06/2018) for COPD.    The patient was given clear instructions to go to ER or return to medical center if symptoms do not improve, worsen or new problems develop. The patient verbalized understanding and agreed with plan of care.   Ms. Doug Sou. Nathaneil Canary, FNP-BC Patient [facility] [address] [place], [place] [address] [phone]

## 2018-08-10 ENCOUNTER — Telehealth: Payer: Self-pay

## 2018-08-10 NOTE — Telephone Encounter (Signed)
-----   Message from Lanae Boast, [place] sent at 08/09/2018  5:23 PM EST ----- Please let patient know that his chest x ray was normal. No signs of pneumonia.

## 2018-08-10 NOTE — Telephone Encounter (Signed)
Called, no answer. Left a message that recent xray was normal and if any questions to call back to our office. Thanks!

## 2018-08-14 ENCOUNTER — Other Ambulatory Visit: Payer: Self-pay | Admitting: Family Medicine

## 2018-08-15 ENCOUNTER — Other Ambulatory Visit: Payer: Self-pay

## 2018-08-15 MED ORDER — PANTOPRAZOLE SODIUM 40 MG PO TBEC: 40.0000 mg | DELAYED_RELEASE_TABLET | Freq: Every day | ORAL | 1 refills | Status: DC

## 2018-09-05 MED FILL — MONTELUKAST SOD 10 MG TAB: 10 | 30 days supply | Qty: 30 | Fill #1

## 2018-09-05 MED FILL — CLOPIDOGREL 75 MG TABLET: 75 | 30 days supply | Qty: 30 | Fill #0

## 2018-09-05 MED FILL — HYDROCHLOROTHIAZIDE 12.5 MG: 12.5 | 30 days supply | Qty: 30 | Fill #0

## 2018-09-05 MED FILL — ATORVASTATIN 80 MG TABLET: 80 | 30 days supply | Qty: 30 | Fill #0

## 2018-09-05 MED FILL — PANTOPRAZOLE SOD DR 40 MG T: 40 | 30 days supply | Qty: 30 | Fill #0

## 2018-09-15 ENCOUNTER — Other Ambulatory Visit: Payer: Self-pay | Admitting: Family Medicine

## 2018-09-15 DIAGNOSIS — J440 Chronic obstructive pulmonary disease with acute lower respiratory infection: Secondary | ICD-10-CM

## 2018-09-15 DIAGNOSIS — J209 Acute bronchitis, unspecified: Secondary | ICD-10-CM

## 2018-09-15 DIAGNOSIS — Z720 Tobacco use: Secondary | ICD-10-CM

## 2018-09-15 DIAGNOSIS — J449 Chronic obstructive pulmonary disease, unspecified: Secondary | ICD-10-CM

## 2018-09-17 ENCOUNTER — Telehealth: Payer: Self-pay

## 2018-09-17 MED FILL — $CHANTIX 1 MG TABLET: 1 | 30 days supply | Qty: 60 | Fill #0

## 2018-09-17 MED FILL — $VENTOLIN HFA 18G INHALER: 108 (90 BAS | 25 days supply | Qty: 18 | Fill #0

## 2018-09-17 NOTE — Telephone Encounter (Signed)
Pharmacy needs to script for Chantix as patient has completed the started pack. Please send to [facility].

## 2018-10-09 ENCOUNTER — Other Ambulatory Visit: Payer: Self-pay | Admitting: Family Medicine

## 2018-10-09 DIAGNOSIS — J440 Chronic obstructive pulmonary disease with acute lower respiratory infection: Secondary | ICD-10-CM

## 2018-10-09 DIAGNOSIS — J449 Chronic obstructive pulmonary disease, unspecified: Secondary | ICD-10-CM

## 2018-10-09 DIAGNOSIS — J209 Acute bronchitis, unspecified: Secondary | ICD-10-CM

## 2018-10-09 MED FILL — PANTOPRAZOLE SOD DR 40 MG T: 40 | 30 days supply | Qty: 30 | Fill #1

## 2018-10-09 MED FILL — HYDROCHLOROTHIAZIDE 12.5 MG: 12.5 | 30 days supply | Qty: 30 | Fill #1

## 2018-10-09 MED FILL — MONTELUKAST SOD 10 MG TAB: 10 | 30 days supply | Qty: 30 | Fill #2

## 2018-10-09 MED FILL — CLOPIDOGREL 75 MG TABLET: 75 | 30 days supply | Qty: 30 | Fill #1

## 2018-10-09 MED FILL — $VENTOLIN HFA 18G INHALER: 108 (90 BAS | 20 days supply | Qty: 18 | Fill #0

## 2018-11-06 ENCOUNTER — Telehealth: Payer: Self-pay

## 2018-11-06 NOTE — Telephone Encounter (Signed)
Called to do COVID Screening for appointment tomorrow. No answer. Left a message to call back. Thanks! 

## 2018-11-07 ENCOUNTER — Other Ambulatory Visit: Payer: Self-pay

## 2018-11-07 ENCOUNTER — Ambulatory Visit (INDEPENDENT_AMBULATORY_CARE_PROVIDER_SITE_OTHER): Payer: Medicare Other | Admitting: Family Medicine

## 2018-11-07 ENCOUNTER — Encounter: Payer: Self-pay | Admitting: Family Medicine

## 2018-11-07 VITALS — BP 132/78 | HR 85 | Temp 98.2°F | Resp 16 | Ht 73.0 in | Wt 187.0 lb

## 2018-11-07 DIAGNOSIS — Z720 Tobacco use: Secondary | ICD-10-CM | POA: Diagnosis not present

## 2018-11-07 DIAGNOSIS — Z23 Encounter for immunization: Secondary | ICD-10-CM | POA: Diagnosis not present

## 2018-11-07 DIAGNOSIS — I10 Essential (primary) hypertension: Secondary | ICD-10-CM | POA: Diagnosis not present

## 2018-11-07 LAB — POCT URINALYSIS DIPSTICK
Bilirubin, UA: NEGATIVE
Blood, UA: NEGATIVE
Glucose, UA: NEGATIVE
Ketones, UA: NEGATIVE
Leukocytes, UA: NEGATIVE
Nitrite, UA: NEGATIVE
Protein, UA: NEGATIVE
Spec Grav, UA: 1.02 (ref 1.010–1.025)
Urobilinogen, UA: 0.2 E.U./dL
pH, UA: 5.5 (ref 5.0–8.0)

## 2018-11-07 MED ORDER — VARENICLINE TARTRATE 1 MG PO TABS: 1.0000 mg | ORAL_TABLET | Freq: Two times a day (BID) | ORAL | 0 refills | Status: DC

## 2018-11-07 MED FILL — !CHANTIX CONT MONTH BOX: 1 | 28 days supply | Qty: 56 | Fill #0

## 2018-11-07 NOTE — Patient Instructions (Signed)
Health Maintenance, Male A healthy lifestyle and preventive care is important for your health and wellness. Ask your health care provider about what schedule of regular examinations is right for you. What should I know about weight and diet? Eat a Healthy Diet  Eat plenty of vegetables, fruits, whole grains, low-fat dairy products, and lean protein.  Do not eat a lot of foods high in solid fats, added sugars, or salt.  Maintain a Healthy Weight Regular exercise can help you achieve or maintain a healthy weight. You should:  Do at least 150 minutes of exercise each week. The exercise should increase your heart rate and make you sweat (moderate-intensity exercise).  Do strength-training exercises at least twice a week. Watch Your Levels of Cholesterol and Blood Lipids  Have your blood tested for lipids and cholesterol every 5 years starting at 65 years of age. If you are at high risk for heart disease, you should start having your blood tested when you are 65 years old. You may need to have your cholesterol levels checked more often if: ? Your lipid or cholesterol levels are high. ? You are older than 65 years of age. ? You are at high risk for heart disease. What should I know about cancer screening? Many types of cancers can be detected early and may often be prevented. Lung Cancer  You should be screened every year for lung cancer if: ? You are a current smoker who has smoked for at least 30 years. ? You are a former smoker who has quit within the past 15 years.  Talk to your health care provider about your screening options, when you should start screening, and how often you should be screened. Colorectal Cancer  Routine colorectal cancer screening usually begins at 65 years of age and should be repeated every 5-10 years until you are 65 years old. You may need to be screened more often if early forms of precancerous polyps or small growths are found. Your health care provider may  recommend screening at an earlier age if you have risk factors for colon cancer.  Your health care provider may recommend using home test kits to check for hidden blood in the stool.  A small camera at the end of a tube can be used to examine your colon (sigmoidoscopy or colonoscopy). This checks for the earliest forms of colorectal cancer. Prostate and Testicular Cancer  Depending on your age and overall health, your health care provider may do certain tests to screen for prostate and testicular cancer.  Talk to your health care provider about any symptoms or concerns you have about testicular or prostate cancer. Skin Cancer  Check your skin from head to toe regularly.  Tell your health care provider about any new moles or changes in moles, especially if: ? There is a change in a mole's size, shape, or color. ? You have a mole that is larger than a pencil eraser.  Always use sunscreen. Apply sunscreen liberally and repeat throughout the day.  Protect yourself by wearing long sleeves, pants, a wide-brimmed hat, and sunglasses when outside. What should I know about heart disease, diabetes, and high blood pressure?  If you are 18-39 years of age, have your blood pressure checked every 3-5 years. If you are 40 years of age or older, have your blood pressure checked every year. You should have your blood pressure measured twice-once when you are at a hospital or clinic, and once when you are not at a hospital   or clinic. Record the average of the two measurements. To check your blood pressure when you are not at a hospital or clinic, you can use: ? An automated blood pressure machine at a pharmacy. ? A home blood pressure monitor.  Talk to your health care provider about your target blood pressure.  If you are between 82-15 years old, ask your health care provider if you should take aspirin to prevent heart disease.  Have regular diabetes screenings by checking your fasting blood sugar  level. ? If you are at a normal weight and have a low risk for diabetes, have this test once every three years after the age of 42. ? If you are overweight and have a high risk for diabetes, consider being tested at a younger age or more often.  A one-time screening for abdominal aortic aneurysm (AAA) by ultrasound is recommended for men aged 62-75 years who are current or former smokers. What should I know about preventing infection? Hepatitis B If you have a higher risk for hepatitis B, you should be screened for this virus. Talk with your health care provider to find out if you are at risk for hepatitis B infection. Hepatitis C Blood testing is recommended for:  Everyone born from [birth year] through 1967.  Anyone with known risk factors for hepatitis C. Sexually Transmitted Diseases (STDs)  You should be screened each year for STDs including gonorrhea and chlamydia if: ? You are sexually active and are younger than 64 years of age. ? You are older than 65 years of age and your health care provider tells you that you are at risk for this type of infection. ? Your sexual activity has changed since you were last screened and you are at an increased risk for chlamydia or gonorrhea. Ask your health care provider if you are at risk.  Talk with your health care provider about whether you are at high risk of being infected with HIV. Your health care provider may recommend a prescription medicine to help prevent HIV infection. What else can I do?  Schedule regular health, dental, and eye exams.  Stay current with your vaccines (immunizations).  Do not use any tobacco products, such as cigarettes, chewing tobacco, and e-cigarettes. If you need help quitting, ask your health care provider.  Limit alcohol intake to no more than 2 drinks per day. One drink equals 12 ounces of beer, 5 ounces of wine, or 1 ounces of hard liquor.  Do not use street drugs.  Do not share needles.  Ask your health  care provider for help if you need support or information about quitting drugs.  Tell your health care provider if you often feel depressed.  Tell your health care provider if you have ever been abused or do not feel safe at home. This information is not intended to replace advice given to you by your health care provider. Make sure you discuss any questions you have with your health care provider. Document Released: 11/26/2007 Document Revised: 01/27/2016 Document Reviewed: 03/03/2015 Elsevier Interactive Patient Education  2019 [employer].    Steps to Quit Smoking  Smoking tobacco can be bad for your health. It can also affect almost every organ in your body. Smoking puts you and people around you at risk for many serious long-lasting (chronic) diseases. Quitting smoking is hard, but it is one of the best things that you can do for your health. It is never too late to quit. What are the benefits of quitting  smoking? When you quit smoking, you lower your risk for getting serious diseases and conditions. They can include:  Lung cancer or lung disease.  Heart disease.  Stroke.  Heart attack.  Not being able to have children (infertility).  Weak bones (osteoporosis) and broken bones (fractures). If you have coughing, wheezing, and shortness of breath, those symptoms may get better when you quit. You may also get sick less often. If you are pregnant, quitting smoking can help to lower your chances of having a baby of low birth weight. What can I do to help me quit smoking? Talk with your doctor about what can help you quit smoking. Some things you can do (strategies) include:  Quitting smoking totally, instead of slowly cutting back how much you smoke over a period of time.  Going to in-person counseling. You are more likely to quit if you go to many counseling sessions.  Using resources and support systems, such as: ? Database administrator with a Social worker. ? Phone quitlines. ? Event organiser. ? Support groups or group counseling. ? Text messaging programs. ? Mobile phone apps or applications.  Taking medicines. Some of these medicines may have nicotine in them. If you are pregnant or breastfeeding, do not take any medicines to quit smoking unless your doctor says it is okay. Talk with your doctor about counseling or other things that can help you. Talk with your doctor about using more than one strategy at the same time, such as taking medicines while you are also going to in-person counseling. This can help make quitting easier. What things can I do to make it easier to quit? Quitting smoking might feel very hard at first, but there is a lot that you can do to make it easier. Take these steps:  Talk to your family and friends. Ask them to support and encourage you.  Call phone quitlines, reach out to support groups, or work with a Social worker.  Ask people who smoke to not smoke around you.  Avoid places that make you want (trigger) to smoke, such as: ? Bars. ? Parties. ? Smoke-break areas at work.  Spend time with people who do not smoke.  Lower the stress in your life. Stress can make you want to smoke. Try these things to help your stress: ? Getting regular exercise. ? Deep-breathing exercises. ? Yoga. ? Meditating. ? Doing a body scan. To do this, close your eyes, focus on one area of your body at a time from head to toe, and notice which parts of your body are tense. Try to relax the muscles in those areas.  Download or buy apps on your mobile phone or tablet that can help you stick to your quit plan. There are many free apps, such as QuitGuide from the [employer] Office manager for Disease Control and Prevention). You can find more support from smokefree.gov and other websites. This information is not intended to replace advice given to you by your health care provider. Make sure you discuss any questions you have with your health care provider. Document  Released: 03/26/2009 Document Revised: 01/26/2016 Document Reviewed: 10/14/2014 Elsevier Interactive Patient Education  2019 [employer].

## 2018-11-07 NOTE — Progress Notes (Signed)
Patient [facility]   Progress Note: General Provider: Lanae Boast, FNP  SUBJECTIVE:   [name] is a 63 y.o. male who  has a past medical history of Anginal pain ([place]), CAD (coronary artery disease), Cocaine abuse ([place]), COPD (chronic obstructive pulmonary disease) ([place]), GERD (gastroesophageal reflux disease), H/O: eczema, H/O: GI bleed, High cholesterol, Hypertension, Myocardial infarction ([place]), Perforation of duodenal ulcer ([place]), Shortness of breath dyspnea, and Tobacco abuse.. Patient presents today for Hypertension and COPD Patient reports compliance with all medications. He states that he has been out of chantix and has begun smoking 5 cigs per day. He has decreased his smoking significantly with chantix. He states that he has noticed improved breathing. He would like to have a physical today, however, his insurance card has not arrived and he would like to wait until his next visit. He states that his urinary stream has decreased and he contributes this to an enlarged prostate. He denies painful urination, but endorses nocturia.   Review of Systems  Constitutional: Negative.   HENT: Negative.   Eyes: Negative.   Respiratory: Negative.   Cardiovascular: Negative.   Gastrointestinal: Negative.   Genitourinary: Positive for frequency (nocturia).  Musculoskeletal: Negative.   Skin: Negative.   Neurological: Negative.   Psychiatric/Behavioral: Negative.      OBJECTIVE: BP 132/78 (BP Location: Right Arm, Patient Position: Sitting, Cuff Size: Normal)   Pulse 85   Temp 98.2 F (36.8 C) (Oral)   Resp 16   Ht 6\' 1"  (1.854 m)   Wt 187 lb (84.8 kg)   SpO2 100%   BMI 24.67 kg/m   Wt Readings from Last 3 Encounters:  11/07/18 187 lb (84.8 kg)  08/08/18 192 lb (87.1 kg)  07/23/18 184 lb (83.5 kg)     Physical Exam Vitals signs and nursing note reviewed.  Constitutional:      General: He is not in acute distress.  Appearance: He is well-developed.  HENT:     Head: Normocephalic and atraumatic.  Eyes:     Conjunctiva/sclera: Conjunctivae normal.     Pupils: Pupils are equal, round, and reactive to light.  Neck:     Musculoskeletal: Normal range of motion.  Cardiovascular:     Rate and Rhythm: Normal rate and regular rhythm.     Heart sounds: Normal heart sounds.  Pulmonary:     Effort: Pulmonary effort is normal. No respiratory distress.     Breath sounds: Normal breath sounds.  Abdominal:     General: Bowel sounds are normal. There is no distension.     Palpations: Abdomen is soft.  Musculoskeletal: Normal range of motion.  Skin:    General: Skin is warm and dry.  Neurological:     Mental Status: He is alert and oriented to person, place, and time.  Psychiatric:        Mood and Affect: Mood normal.        Behavior: Behavior normal.        Thought Content: Thought content normal.        Judgment: Judgment normal.     ASSESSMENT/PLAN: 1. Essential hypertension No medication changes warranted at the present time.   - Urinalysis Dipstick  2. Tobacco use Smoking cessation instruction/counseling given:  counseled patient on the dangers of tobacco use, advised patient to stop smoking, and reviewed strategies to maximize success  - varenicline (CHANTIX) 1 MG tablet; Take 1 tablet (1 mg total) by mouth 2 (two) times daily.  Dispense: 60 tablet; Refill: 0  Return in about 3 months (around 02/07/2019) for Full physical with EKG and labs.    The patient was given clear instructions to go to ER or return to medical center if symptoms do not improve, worsen or new problems develop. The patient verbalized understanding and agreed with plan of care.   Ms. Doug Sou. Nathaneil Canary, FNP-BC Patient [facility] [address] [place], [place] [address] [phone]

## 2018-11-13 ENCOUNTER — Other Ambulatory Visit: Payer: Self-pay | Admitting: Family Medicine

## 2018-11-13 DIAGNOSIS — I214 Non-ST elevation (NSTEMI) myocardial infarction: Secondary | ICD-10-CM

## 2018-11-13 DIAGNOSIS — J440 Chronic obstructive pulmonary disease with acute lower respiratory infection: Secondary | ICD-10-CM

## 2018-11-13 DIAGNOSIS — J449 Chronic obstructive pulmonary disease, unspecified: Secondary | ICD-10-CM

## 2018-11-13 DIAGNOSIS — J209 Acute bronchitis, unspecified: Secondary | ICD-10-CM

## 2018-11-13 MED FILL — MONTELUKAST SOD 10 MG TAB: 10 | 30 days supply | Qty: 30 | Fill #3

## 2018-11-13 MED FILL — $BREO ELLIPTA 200-25 MCG IN: 200-25 | 90 days supply | Qty: 180 | Fill #1

## 2018-11-13 MED FILL — HYDROCHLOROTHIAZIDE 12.5 MG: 12.5 | 30 days supply | Qty: 30 | Fill #2

## 2018-11-14 MED FILL — $VENTOLIN HFA 18G INHALER: 108 (90 BAS | 25 days supply | Qty: 18 | Fill #0

## 2018-11-14 MED FILL — CLOPIDOGREL 75 MG TABLET: 75 | 30 days supply | Qty: 30 | Fill #0

## 2018-11-14 MED FILL — ?PANTOPRAZOLE SOD DR 40MGTA: 40 | 30 days supply | Qty: 30 | Fill #0

## 2018-12-26 ENCOUNTER — Other Ambulatory Visit: Payer: Self-pay | Admitting: Family Medicine

## 2018-12-26 DIAGNOSIS — Z720 Tobacco use: Secondary | ICD-10-CM

## 2018-12-26 DIAGNOSIS — J209 Acute bronchitis, unspecified: Secondary | ICD-10-CM

## 2018-12-26 DIAGNOSIS — J449 Chronic obstructive pulmonary disease, unspecified: Secondary | ICD-10-CM

## 2018-12-26 DIAGNOSIS — J440 Chronic obstructive pulmonary disease with acute lower respiratory infection: Secondary | ICD-10-CM

## 2018-12-26 MED FILL — HYDROCHLOROTHIAZIDE 12.5 MG: 12.5 | 30 days supply | Qty: 30 | Fill #3

## 2018-12-26 MED FILL — CLOPIDOGREL 75 MG TABLET: 75 | 30 days supply | Qty: 30 | Fill #1

## 2018-12-26 MED FILL — ATORVASTATIN 80 MG TABLET: 80 | 30 days supply | Qty: 30 | Fill #1

## 2018-12-26 MED FILL — ?PANTOPRAZOLE SO DR 40MG TA: 40 | 30 days supply | Qty: 30 | Fill #1

## 2018-12-27 MED FILL — $VENTOLIN HFA 18G INHALER: 108 (90 BAS | 25 days supply | Qty: 18 | Fill #0

## 2018-12-27 MED FILL — MONTELUKAST SOD 10 MG TAB: 10 | 30 days supply | Qty: 30 | Fill #0

## 2018-12-27 MED FILL — $CHANTIX 1 MG TABLET: 1 | 28 days supply | Qty: 56 | Fill #0

## 2019-02-05 ENCOUNTER — Other Ambulatory Visit: Payer: Self-pay | Admitting: Family Medicine

## 2019-02-05 DIAGNOSIS — I214 Non-ST elevation (NSTEMI) myocardial infarction: Secondary | ICD-10-CM

## 2019-02-05 DIAGNOSIS — J440 Chronic obstructive pulmonary disease with acute lower respiratory infection: Secondary | ICD-10-CM

## 2019-02-05 DIAGNOSIS — J449 Chronic obstructive pulmonary disease, unspecified: Secondary | ICD-10-CM

## 2019-02-05 DIAGNOSIS — J209 Acute bronchitis, unspecified: Secondary | ICD-10-CM

## 2019-02-05 DIAGNOSIS — Z720 Tobacco use: Secondary | ICD-10-CM

## 2019-02-05 MED FILL — $BREO ELLIPTA 200-25 MCG IN: 200-25 | 90 days supply | Qty: 180 | Fill #2

## 2019-02-05 MED FILL — HYDROCHLOROTHIAZIDE 12.5 MG: 12.5 | 30 days supply | Qty: 30 | Fill #4

## 2019-02-05 MED FILL — MONTELUKAST SOD 10 MG TAB: 10 | 30 days supply | Qty: 30 | Fill #1

## 2019-02-05 MED FILL — ATORVASTATIN 80 MG TABLET: 80 | 30 days supply | Qty: 30 | Fill #2

## 2019-02-06 MED FILL — CLOPIDOGREL 75 MG TABLET: 75 | 30 days supply | Qty: 30 | Fill #0

## 2019-02-06 MED FILL — $VENTOLIN HFA 18G INHALER: 108 (90 BAS | 25 days supply | Qty: 18 | Fill #0

## 2019-02-06 MED FILL — $CHANTIX 1 MG TABLET: 1 | 28 days supply | Qty: 56 | Fill #0

## 2019-02-06 MED FILL — ?PANTOPRAZOLE SODI DR 40MGT: 40 | 30 days supply | Qty: 30 | Fill #0

## 2019-02-07 ENCOUNTER — Emergency Department (HOSPITAL_COMMUNITY): Payer: Medicare Other

## 2019-02-07 ENCOUNTER — Encounter (HOSPITAL_COMMUNITY): Payer: Self-pay | Admitting: Family Medicine

## 2019-02-07 ENCOUNTER — Ambulatory Visit: Payer: Self-pay | Admitting: Family Medicine

## 2019-02-07 ENCOUNTER — Other Ambulatory Visit: Payer: Self-pay

## 2019-02-07 ENCOUNTER — Inpatient Hospital Stay (HOSPITAL_COMMUNITY)
Admission: EM | Admit: 2019-02-07 | Discharge: 2019-02-09 | DRG: 917 | Disposition: A | Discharge status: Home / Self Care | Payer: Medicare Other | Attending: Internal Medicine | Admitting: Internal Medicine

## 2019-02-07 DIAGNOSIS — K279 Peptic ulcer, site unspecified, unspecified as acute or chronic, without hemorrhage or perforation: Secondary | ICD-10-CM | POA: Diagnosis present

## 2019-02-07 DIAGNOSIS — I2510 Atherosclerotic heart disease of native coronary artery without angina pectoris: Secondary | ICD-10-CM | POA: Diagnosis present

## 2019-02-07 DIAGNOSIS — R000 Tachycardia, unspecified: Secondary | ICD-10-CM | POA: Diagnosis not present

## 2019-02-07 DIAGNOSIS — J9601 Acute respiratory failure with hypoxia: Secondary | ICD-10-CM | POA: Diagnosis present

## 2019-02-07 DIAGNOSIS — J441 Chronic obstructive pulmonary disease with (acute) exacerbation: Secondary | ICD-10-CM | POA: Diagnosis not present

## 2019-02-07 DIAGNOSIS — D509 Iron deficiency anemia, unspecified: Secondary | ICD-10-CM | POA: Diagnosis present

## 2019-02-07 DIAGNOSIS — Z801 Family history of malignant neoplasm of trachea, bronchus and lung: Secondary | ICD-10-CM

## 2019-02-07 DIAGNOSIS — Z7902 Long term (current) use of antithrombotics/antiplatelets: Secondary | ICD-10-CM

## 2019-02-07 DIAGNOSIS — J9602 Acute respiratory failure with hypercapnia: Secondary | ICD-10-CM | POA: Diagnosis present

## 2019-02-07 DIAGNOSIS — Z79899 Other long term (current) drug therapy: Secondary | ICD-10-CM

## 2019-02-07 DIAGNOSIS — R0902 Hypoxemia: Secondary | ICD-10-CM | POA: Diagnosis not present

## 2019-02-07 DIAGNOSIS — Z20828 Contact with and (suspected) exposure to other viral communicable diseases: Secondary | ICD-10-CM | POA: Diagnosis not present

## 2019-02-07 DIAGNOSIS — E7800 Pure hypercholesterolemia, unspecified: Secondary | ICD-10-CM | POA: Diagnosis present

## 2019-02-07 DIAGNOSIS — Z7951 Long term (current) use of inhaled steroids: Secondary | ICD-10-CM

## 2019-02-07 DIAGNOSIS — T405X1A Poisoning by cocaine, accidental (unintentional), initial encounter: Secondary | ICD-10-CM | POA: Diagnosis not present

## 2019-02-07 DIAGNOSIS — R7989 Other specified abnormal findings of blood chemistry: Secondary | ICD-10-CM | POA: Diagnosis present

## 2019-02-07 DIAGNOSIS — K219 Gastro-esophageal reflux disease without esophagitis: Secondary | ICD-10-CM | POA: Diagnosis present

## 2019-02-07 DIAGNOSIS — R062 Wheezing: Secondary | ICD-10-CM | POA: Diagnosis not present

## 2019-02-07 DIAGNOSIS — F17210 Nicotine dependence, cigarettes, uncomplicated: Secondary | ICD-10-CM | POA: Diagnosis present

## 2019-02-07 DIAGNOSIS — R0603 Acute respiratory distress: Secondary | ICD-10-CM | POA: Diagnosis not present

## 2019-02-07 DIAGNOSIS — I252 Old myocardial infarction: Secondary | ICD-10-CM

## 2019-02-07 DIAGNOSIS — I248 Other forms of acute ischemic heart disease: Secondary | ICD-10-CM | POA: Diagnosis present

## 2019-02-07 DIAGNOSIS — J80 Acute respiratory distress syndrome: Secondary | ICD-10-CM | POA: Diagnosis not present

## 2019-02-07 DIAGNOSIS — R778 Other specified abnormalities of plasma proteins: Secondary | ICD-10-CM | POA: Diagnosis present

## 2019-02-07 DIAGNOSIS — I10 Essential (primary) hypertension: Secondary | ICD-10-CM | POA: Diagnosis not present

## 2019-02-07 DIAGNOSIS — Z951 Presence of aortocoronary bypass graft: Secondary | ICD-10-CM

## 2019-02-07 DIAGNOSIS — F1410 Cocaine abuse, uncomplicated: Secondary | ICD-10-CM | POA: Diagnosis present

## 2019-02-07 DIAGNOSIS — R0602 Shortness of breath: Secondary | ICD-10-CM | POA: Diagnosis not present

## 2019-02-07 DIAGNOSIS — R9431 Abnormal electrocardiogram [ECG] [EKG]: Secondary | ICD-10-CM | POA: Diagnosis not present

## 2019-02-07 DIAGNOSIS — R0689 Other abnormalities of breathing: Secondary | ICD-10-CM | POA: Diagnosis not present

## 2019-02-07 DIAGNOSIS — J9600 Acute respiratory failure, unspecified whether with hypoxia or hypercapnia: Secondary | ICD-10-CM | POA: Diagnosis present

## 2019-02-07 DIAGNOSIS — R739 Hyperglycemia, unspecified: Secondary | ICD-10-CM | POA: Diagnosis present

## 2019-02-07 LAB — TYPE AND SCREEN
ABO/RH(D): O POS
Antibody Screen: NEGATIVE

## 2019-02-07 LAB — CBC WITH DIFFERENTIAL/PLATELET
Abs Immature Granulocytes: 0.05 10*3/uL (ref 0.00–0.07)
Basophils Absolute: 0.1 10*3/uL (ref 0.0–0.1)
Basophils Relative: 1 %
Eosinophils Absolute: 0.5 10*3/uL (ref 0.0–0.5)
Eosinophils Relative: 4 %
HCT: 35.6 % — ABNORMAL LOW (ref 39.0–52.0)
Hemoglobin: 9.7 g/dL — ABNORMAL LOW (ref 13.0–17.0)
Immature Granulocytes: 0 %
Lymphocytes Relative: 29 %
Lymphs Abs: 3.6 10*3/uL (ref 0.7–4.0)
MCH: 19 pg — ABNORMAL LOW (ref 26.0–34.0)
MCHC: 27.2 g/dL — ABNORMAL LOW (ref 30.0–36.0)
MCV: 69.7 fL — ABNORMAL LOW (ref 80.0–100.0)
Monocytes Absolute: 1.2 10*3/uL — ABNORMAL HIGH (ref 0.1–1.0)
Monocytes Relative: 10 %
Neutro Abs: 6.8 10*3/uL (ref 1.7–7.7)
Neutrophils Relative %: 56 %
Platelets: 400 10*3/uL (ref 150–400)
RBC: 5.11 MIL/uL (ref 4.22–5.81)
RDW: 20.6 % — ABNORMAL HIGH (ref 11.5–15.5)
WBC: 12.3 10*3/uL — ABNORMAL HIGH (ref 4.0–10.5)
nRBC: 0 % (ref 0.0–0.2)

## 2019-02-07 LAB — COMPREHENSIVE METABOLIC PANEL
ALT: 14 U/L (ref 0–44)
AST: 24 U/L (ref 15–41)
Albumin: 4.2 g/dL (ref 3.5–5.0)
Alkaline Phosphatase: 132 U/L — ABNORMAL HIGH (ref 38–126)
Anion gap: 11 (ref 5–15)
BUN: 10 mg/dL (ref 8–23)
CO2: 23 mmol/L (ref 22–32)
Calcium: 9.1 mg/dL (ref 8.9–10.3)
Chloride: 107 mmol/L (ref 98–111)
Creatinine, Ser: 1.14 mg/dL (ref 0.61–1.24)
GFR calc Af Amer: >60 mL/min (ref >60)
GFR calc non Af Amer: >60 mL/min (ref >60)
Glucose, Bld: 156 mg/dL — ABNORMAL HIGH (ref 70–99)
Potassium: 4.4 mmol/L (ref 3.5–5.1)
Sodium: 141 mmol/L (ref 135–145)
Total Bilirubin: 1 mg/dL (ref 0.3–1.2)
Total Protein: 7.6 g/dL (ref 6.5–8.1)

## 2019-02-07 LAB — POCT I-STAT EG7
Acid-base deficit: 1 mmol/L (ref 0.0–2.0)
Bicarbonate: 26.9 mmol/L (ref 20.0–28.0)
Bicarbonate: 30.5 mmol/L — ABNORMAL HIGH (ref 20.0–28.0)
Calcium, Ion: 1.15 mmol/L (ref 1.15–1.40)
Calcium, Ion: 1.39 mmol/L (ref 1.15–1.40)
HCT: 35 % — ABNORMAL LOW (ref 39.0–52.0)
HCT: 35 % — ABNORMAL LOW (ref 39.0–52.0)
Hemoglobin: 11.9 g/dL — ABNORMAL LOW (ref 13.0–17.0)
Hemoglobin: 11.9 g/dL — ABNORMAL LOW (ref 13.0–17.0)
O2 Saturation: 99 %
O2 Saturation: 99 %
Patient temperature: 98.6
Potassium: 4.1 mmol/L (ref 3.5–5.1)
Potassium: 4.2 mmol/L (ref 3.5–5.1)
Sodium: 141 mmol/L (ref 135–145)
Sodium: 142 mmol/L (ref 135–145)
TCO2: 29 mmol/L (ref 22–32)
TCO2: 33 mmol/L — ABNORMAL HIGH (ref 22–32)
pCO2, Ven: 55.1 mmHg (ref 44.0–60.0)
pCO2, Ven: 90.3 mmHg (ref 44.0–60.0)
pH, Ven: 7.137 — CL (ref 7.250–7.430)
pH, Ven: 7.297 (ref 7.250–7.430)
pO2, Ven: 147 mmHg — ABNORMAL HIGH (ref 32.0–45.0)
pO2, Ven: 179 mmHg — ABNORMAL HIGH (ref 32.0–45.0)

## 2019-02-07 LAB — TROPONIN I (HIGH SENSITIVITY)
Troponin I (High Sensitivity): 13 ng/L (ref <18)
Troponin I (High Sensitivity): 39 ng/L — ABNORMAL HIGH (ref <18)
Troponin I (High Sensitivity): 151 ng/L (ref <18)

## 2019-02-07 LAB — PROTIME-INR
INR: 1 (ref 0.8–1.2)
Prothrombin Time: 13.4 seconds (ref 11.4–15.2)

## 2019-02-07 LAB — POCT I-STAT 7, (LYTES, BLD GAS, ICA,H+H)
Acid-base deficit: 2 mmol/L (ref 0.0–2.0)
Bicarbonate: 24.8 mmol/L (ref 20.0–28.0)
Calcium, Ion: 1.26 mmol/L (ref 1.15–1.40)
HCT: 32 % — ABNORMAL LOW (ref 39.0–52.0)
Hemoglobin: 10.9 g/dL — ABNORMAL LOW (ref 13.0–17.0)
O2 Saturation: 93 %
Patient temperature: 98.7
Potassium: 3.8 mmol/L (ref 3.5–5.1)
Sodium: 143 mmol/L (ref 135–145)
TCO2: 26 mmol/L (ref 22–32)
pCO2 arterial: 48.3 mmHg — ABNORMAL HIGH (ref 32.0–48.0)
pH, Arterial: 7.318 — ABNORMAL LOW (ref 7.350–7.450)
pO2, Arterial: 74 mmHg — ABNORMAL LOW (ref 83.0–108.0)

## 2019-02-07 LAB — BRAIN NATRIURETIC PEPTIDE: B Natriuretic Peptide: 145.8 pg/mL — ABNORMAL HIGH (ref 0.0–100.0)

## 2019-02-07 LAB — SARS CORONAVIRUS 2 BY RT PCR (HOSPITAL ORDER, PERFORMED IN ~~LOC~~ HOSPITAL LAB): SARS Coronavirus 2: NEGATIVE

## 2019-02-07 LAB — HEMOGLOBIN A1C
Hgb A1c MFr Bld: 6 % — ABNORMAL HIGH (ref 4.8–5.6)
Mean Plasma Glucose: 125.5 mg/dL

## 2019-02-07 MED ORDER — SODIUM CHLORIDE 0.9% FLUSH: 3.0000 mL | INTRAVENOUS | Status: DC | PRN

## 2019-02-07 MED ORDER — POLYETHYLENE GLYCOL 3350 17 G PO PACK
17.0000 g | PACK | Freq: Every day | ORAL | Status: DC | PRN
  Administered 2019-02-08 – 2019-02-09 (×2): 17 g via ORAL
  Filled 2019-02-07 (×2): qty 1

## 2019-02-07 MED ORDER — ADULT MULTIVITAMIN W/MINERALS CH
1.0000 | ORAL_TABLET | ORAL | Status: DC
  Administered 2019-02-07 – 2019-02-09 (×3): 1 via ORAL
  Filled 2019-02-07 (×3): qty 1

## 2019-02-07 MED ORDER — SODIUM CHLORIDE 0.9 % IV SOLN: 250.0000 mL | INTRAVENOUS | Status: DC | PRN

## 2019-02-07 MED ORDER — ATORVASTATIN CALCIUM 80 MG PO TABS
80.0000 mg | ORAL_TABLET | Freq: Every day | ORAL | Status: DC
  Administered 2019-02-07 – 2019-02-08 (×2): 80 mg via ORAL
  Filled 2019-02-07 (×2): qty 1

## 2019-02-07 MED ORDER — ACETAMINOPHEN 325 MG PO TABS: 650.0000 mg | ORAL_TABLET | Freq: Four times a day (QID) | ORAL | Status: DC | PRN

## 2019-02-07 MED ORDER — SODIUM CHLORIDE 0.9 % IV SOLN: 500.0000 mg | INTRAVENOUS | Status: DC

## 2019-02-07 MED ORDER — ENOXAPARIN SODIUM 40 MG/0.4ML ~~LOC~~ SOLN
40.0000 mg | SUBCUTANEOUS | Status: DC
  Administered 2019-02-07 – 2019-02-09 (×3): 40 mg via SUBCUTANEOUS
  Filled 2019-02-07 (×3): qty 0.4

## 2019-02-07 MED ORDER — SODIUM CHLORIDE 0.9% FLUSH
3.0000 mL | Freq: Two times a day (BID) | INTRAVENOUS | Status: DC
  Administered 2019-02-07 – 2019-02-09 (×5): 3 mL via INTRAVENOUS

## 2019-02-07 MED ORDER — IPRATROPIUM-ALBUTEROL 0.5-2.5 (3) MG/3ML IN SOLN
3.0000 mL | Freq: Four times a day (QID) | RESPIRATORY_TRACT | Status: DC
  Administered 2019-02-07 (×3): 3 mL via RESPIRATORY_TRACT
  Filled 2019-02-07 (×3): qty 3

## 2019-02-07 MED ORDER — HYDROCHLOROTHIAZIDE 12.5 MG PO CAPS
12.5000 mg | ORAL_CAPSULE | Freq: Every day | ORAL | Status: DC
  Administered 2019-02-07 – 2019-02-09 (×3): 12.5 mg via ORAL
  Filled 2019-02-07 (×3): qty 1

## 2019-02-07 MED ORDER — CLOPIDOGREL BISULFATE 75 MG PO TABS
75.0000 mg | ORAL_TABLET | Freq: Every day | ORAL | Status: DC
  Administered 2019-02-07 – 2019-02-09 (×3): 75 mg via ORAL
  Filled 2019-02-07 (×3): qty 1

## 2019-02-07 MED ORDER — ONDANSETRON HCL 4 MG/2ML IJ SOLN: 4.0000 mg | Freq: Four times a day (QID) | INTRAMUSCULAR | Status: DC | PRN

## 2019-02-07 MED ORDER — METHYLPREDNISOLONE SODIUM SUCC 125 MG IJ SOLR
60.0000 mg | Freq: Four times a day (QID) | INTRAMUSCULAR | Status: DC
  Administered 2019-02-07 – 2019-02-08 (×5): 60 mg via INTRAVENOUS
  Filled 2019-02-07 (×5): qty 2

## 2019-02-07 MED ORDER — ACETAMINOPHEN 650 MG RE SUPP: 650.0000 mg | Freq: Four times a day (QID) | RECTAL | Status: DC | PRN

## 2019-02-07 MED ORDER — ONDANSETRON HCL 4 MG PO TABS: 4.0000 mg | ORAL_TABLET | Freq: Four times a day (QID) | ORAL | Status: DC | PRN

## 2019-02-07 MED ORDER — IPRATROPIUM-ALBUTEROL 0.5-2.5 (3) MG/3ML IN SOLN
3.0000 mL | Freq: Three times a day (TID) | RESPIRATORY_TRACT | Status: DC
  Administered 2019-02-08 (×3): 3 mL via RESPIRATORY_TRACT
  Filled 2019-02-07 (×3): qty 3

## 2019-02-07 MED ORDER — FLUTICASONE FUROATE-VILANTEROL 200-25 MCG/INH IN AEPB
1.0000 | INHALATION_SPRAY | Freq: Every day | RESPIRATORY_TRACT | Status: DC
  Administered 2019-02-07: 14:00:00 1 via RESPIRATORY_TRACT
  Filled 2019-02-07: qty 28

## 2019-02-07 MED ORDER — DOXYCYCLINE HYCLATE 100 MG PO TABS
100.0000 mg | ORAL_TABLET | Freq: Two times a day (BID) | ORAL | Status: DC
  Administered 2019-02-07 – 2019-02-09 (×5): 100 mg via ORAL
  Filled 2019-02-07 (×5): qty 1

## 2019-02-07 MED ORDER — NICOTINE 21 MG/24HR TD PT24
21.0000 mg | MEDICATED_PATCH | Freq: Every day | TRANSDERMAL | Status: DC
  Administered 2019-02-07 – 2019-02-09 (×3): 21 mg via TRANSDERMAL
  Filled 2019-02-07 (×3): qty 1

## 2019-02-07 MED ORDER — ALBUTEROL SULFATE HFA 108 (90 BASE) MCG/ACT IN AERS
8.0000 | INHALATION_SPRAY | RESPIRATORY_TRACT | Status: DC | PRN
  Administered 2019-02-07 – 2019-02-09 (×4): 8 via RESPIRATORY_TRACT
  Filled 2019-02-07: qty 6.7

## 2019-02-07 MED ORDER — PANTOPRAZOLE SODIUM 40 MG PO TBEC
40.0000 mg | DELAYED_RELEASE_TABLET | Freq: Every day | ORAL | Status: DC
  Administered 2019-02-07 – 2019-02-09 (×3): 40 mg via ORAL
  Filled 2019-02-07 (×3): qty 1

## 2019-02-07 NOTE — Progress Notes (Signed)
CRITICAL VALUE ALERT  Critical Value:  Troponin 151  Date & Time Notied:  02/07/19 @ [id number]  Provider Notified: Dr Eliseo Squires paged  Orders Received/Actions taken: Echo ordered. Pt stable, will continue to monitor.

## 2019-02-07 NOTE — ED Notes (Signed)
ED TO INPATIENT HANDOFF REPORT  ED Nurse Name and Phone #:  [phone]  S Name/Age/Gender [name] 63 y.o. male Room/Bed: TRACC/TRACC  Code Status   Code Status: Prior  Home/SNF/Other Home Patient oriented to: self, place, time and situation Is this baseline? Yes   Triage Complete: Triage complete  Chief Complaint sob  Triage Note See paper chart for additional documentation froin 01:45a.m. to 03:15 a.m.   Allergies No Known Allergies  Level of Care/Admitting Diagnosis ED Disposition    ED Disposition Condition [facility] Area: [place] [100100]  Level of Care: Telemetry Medical [104]  I expect the patient will be discharged within 24 hours: Yes  LOW acuity---Tx typically complete <24 hrs---ACUTE conditions typically can be evaluated <24 hours---LABS likely to return to acceptable levels <24 hours---IS near functional baseline---EXPECTED to return to current living arrangement---NOT newly hypoxic: Does not meet criteria for 5C-Observation unit  Covid Evaluation: Asymptomatic Screening Protocol (No Symptoms)  Diagnosis: COPD exacerbation ([place]) [id number]  Admitting Physician: Vianne Bulls [id number]  Attending Physician: Vianne Bulls [id number]  PT Class (Do Not Modify): Observation [104]  PT Acc Code (Do Not Modify): Observation [10022]       B Medical/Surgery History Past Medical History:  Diagnosis Date  . Anginal pain ([place])   . CAD (coronary artery disease)    4V CABG 10/31/13  . Cocaine abuse ([place])   . COPD (chronic obstructive pulmonary disease) ([place])   . GERD (gastroesophageal reflux disease)   . H/O: eczema    lower extremities  . H/O: GI bleed    a. 63 y/o ago, r/t ulcer. Denies recent GIB.  Marland Kitchen High cholesterol   . Hypertension   . Myocardial infarction ([place])   . Perforation of duodenal ulcer ([place])    a. 1970s - s/p surgery.   . Shortness of breath dyspnea   . Tobacco abuse    Past Surgical History:   Procedure Laterality Date  . BIOPSY  11/05/2017   Procedure: BIOPSY;  Surgeon: Wilford Corner, MD;  Location: [place];  Service: Endoscopy;;  . CORONARY ARTERY BYPASS GRAFT N/A 10/31/2013   Procedure: CORONARY ARTERY BYPASS GRAFTING TIMES FOUR ON PUMP USING LEFT INTERNAL MAMMARY ARTERY AND RIGHT GREATER SAPHENOUS VEIN VIA ENDOVEIN HARVEST.;  Surgeon: Melrose Nakayama, MD;  Location: [place];  Service: Open Heart Surgery;  Laterality: N/A;  . ENDARTERECTOMY Left 11/06/2017   Procedure: ENDARTERECTOMY CAROTID;  Surgeon: Conrad Dazey, MD;  Location: [facility];  Service: Vascular;  Laterality: Left;  . ESOPHAGOGASTRODUODENOSCOPY (EGD) WITH PROPOFOL N/A 11/05/2017   Procedure: ESOPHAGOGASTRODUODENOSCOPY (EGD) WITH PROPOFOL;  Surgeon: Wilford Corner, MD;  Location: [place];  Service: Endoscopy;  Laterality: N/A;  . INTRAOPERATIVE TRANSESOPHAGEAL ECHOCARDIOGRAM N/A 10/31/2013   Procedure: INTRAOPERATIVE TRANSESOPHAGEAL ECHOCARDIOGRAM;  Surgeon: Melrose Nakayama, MD;  Location: [place];  Service: Open Heart Surgery;  Laterality: N/A;  . LEFT HEART CATHETERIZATION WITH CORONARY ANGIOGRAM N/A 10/28/2013   Procedure: LEFT HEART CATHETERIZATION WITH CORONARY ANGIOGRAM;  Surgeon: Burnell Blanks, MD;  Location[facility] C[facility] LAB;  Service: Cardiovascular;  Laterality: N/A;  . stomach sx       A IV Location/Drains/Wounds Patient Lines/Drains/Airways Status   Active Line/Drains/Airways    Name:   Placement date:   Placement time:   Site:   Days:   Peripheral IV 11/04/17 Left Antecubital   11/04/17    1413    Antecubital   460   Peripheral IV 11/04/17 Right  Antecubital   11/04/17    1755    Antecubital   460   Peripheral IV 11/06/17 Left Hand   11/06/17    0845    Hand   458   Incision (Closed) 10/31/13 Leg Right   10/31/13    1147     1925   Incision (Closed) 10/31/13 Chest Other (Comment)   10/31/13    1147     1925   Incision (Closed) 11/06/17 Neck Left   11/06/17    1051     458           Intake/Output Last 24 hours No intake or output data in the 24 hours ending 02/07/19 [id number]  Labs/Imaging Results for orders placed or performed during the hospital encounter of 02/07/19 (from the past 48 hour(s))  Type and screen Ordered by PROVIDER DEFAULT     Status: None   Collection Time: 02/07/19  2:00 AM  Result Value Ref Range   ABO/RH(D) O POS    Antibody Screen NEG    Sample Expiration      02/10/2019,2359 Performed at [facility], [place] [address], [place], [place] [address]   CBC with Differential     Status: Abnormal   Collection Time: 02/07/19  2:00 AM  Result Value Ref Range   WBC 12.3 (H) 4.0 - 10.5 K/uL   RBC 5.11 4.22 - 5.81 MIL/uL   Hemoglobin 9.7 (L) 13.0 - 17.0 g/dL   HCT 35.6 (L) 39.0 - 52.0 %   MCV 69.7 (L) 80.0 - 100.0 fL   MCH 19.0 (L) 26.0 - 34.0 pg   MCHC 27.2 (L) 30.0 - 36.0 g/dL   RDW 20.6 (H) 11.5 - 15.5 %   Platelets 400 150 - 400 K/uL   nRBC 0.0 0.0 - 0.2 %   Neutrophils Relative % 56 %   Neutro Abs 6.8 1.7 - 7.7 K/uL   Lymphocytes Relative 29 %   Lymphs Abs 3.6 0.7 - 4.0 K/uL   Monocytes Relative 10 %   Monocytes Absolute 1.2 (H) 0.1 - 1.0 K/uL   Eosinophils Relative 4 %   Eosinophils Absolute 0.5 0.0 - 0.5 K/uL   Basophils Relative 1 %   Basophils Absolute 0.1 0.0 - 0.1 K/uL   Immature Granulocytes 0 %   Abs Immature Granulocytes 0.05 0.00 - 0.07 K/uL    Comment: Performed at [facility], [place] [address] [place], [place] [address]  Comprehensive metabolic panel     Status: Abnormal   Collection Time: 02/07/19  2:00 AM  Result Value Ref Range   Sodium 141 135 - 145 mmol/L   Potassium 4.4 3.5 - 5.1 mmol/L   Chloride 107 98 - 111 mmol/L   CO2 23 22 - 32 mmol/L   Glucose, Bld 156 (H) 70 - 99 mg/dL   BUN 10 8 - 23 mg/dL   Creatinine, Ser 1.14 0.61 - 1.24 mg/dL   Calcium 9.1 8.9 - 10.3 mg/dL   Total Protein 7.6 6.5 - 8.1 g/dL   Albumin 4.2 3.5 - 5.0 g/dL   AST 24 15 - 41 U/L   ALT 14 0 - 44 U/L   Alkaline Phosphatase  132 (H) 38 - 126 U/L   Total Bilirubin 1.0 0.3 - 1.2 mg/dL   GFR calc non Af Amer >60 >60 mL/min   GFR calc Af Amer >60 >60 mL/min   Anion gap 11 5 - 15    Comment: Performed at [facility], [place] [address], [place], [place] [address]  Troponin I (High Sensitivity)     Status: None   Collection Time: 02/07/19  2:00 AM  Result Value Ref Range   Troponin I (High Sensitivity) 13 <18 ng/L    Comment: (NOTE) Elevated high sensitivity troponin I (hsTnI) values and significant  changes across serial measurements may suggest ACS but many other  chronic and acute conditions are known to elevate hsTnI results.  Refer to the Links section for chest pain algorithms and additional  guidance. Performed at [facility], [place] [address] [place], [place] [address]   Brain natriuretic peptide     Status: Abnormal   Collection Time: 02/07/19  2:00 AM  Result Value Ref Range   B Natriuretic Peptide 145.8 (H) 0.0 - 100.0 pg/mL    Comment: Performed at [place] [address] [place], [place] [address]  Protime-INR     Status: None   Collection Time: 02/07/19  2:00 AM  Result Value Ref Range   Prothrombin Time 13.4 11.4 - 15.2 seconds   INR 1.0 0.8 - 1.2    Comment: (NOTE) INR goal varies based on device and disease states. Performed at [facility], [place] [address] [place], [place] [address]   I-STAT 7, (LYTES, BLD GAS, ICA, H+H)     Status: Abnormal   Collection Time: 02/07/19  3:57 AM  Result Value Ref Range   pH, Arterial 7.318 (L) 7.350 - 7.450   pCO2 arterial 48.3 (H) 32.0 - 48.0 mmHg   pO2, Arterial 74.0 (L) 83.0 - 108.0 mmHg   Bicarbonate 24.8 20.0 - 28.0 mmol/L   TCO2 26 22 - 32 mmol/L   O2 Saturation 93.0 %   Acid-base deficit 2.0 0.0 - 2.0 mmol/L   Sodium 143 135 - 145 mmol/L   Potassium 3.8 3.5 - 5.1 mmol/L   Calcium, Ion 1.26 1.15 - 1.40 mmol/L   HCT 32.0 (L) 39.0 - 52.0 %   Hemoglobin 10.9 (L) 13.0 - 17.0 g/dL   Patient temperature 98.7 F     Collection site RADIAL, ALLEN'S TEST ACCEPTABLE    Drawn by RT    Sample type ARTERIAL    Dg Chest Port 1 View  Result Date: 02/07/2019 CLINICAL DATA:  Respiratory distress EXAM: PORTABLE CHEST 1 VIEW COMPARISON:  08/08/2018 FINDINGS: Post sternotomy changes. No consolidation or effusion. Normal cardiomediastinal silhouette. No pneumothorax. IMPRESSION: No active disease. Electronically Signed   By: Donavan Foil M.D.   On: 02/07/2019 03:49    Pending Labs Unresulted Labs (From admission, onward)    Start     Ordered   02/08/19 0500  Vitamin B12  (Anemia Panel (PNL))  Tomorrow morning,   R     02/07/19 0538   02/08/19 0500  Folate  (Anemia Panel (PNL))  Tomorrow morning,   R     02/07/19 0538   02/08/19 0500  Iron and TIBC  (Anemia Panel (PNL))  Tomorrow morning,   R     02/07/19 0538   02/08/19 0500  Ferritin  (Anemia Panel (PNL))  Tomorrow morning,   R     02/07/19 0538   02/08/19 0500  Reticulocytes  (Anemia Panel (PNL))  Tomorrow morning,   R     02/07/19 0538   02/07/19 0528  Culture, sputum-assessment  Once,   R     02/07/19 0527   02/07/19 0222  SARS Coronavirus 2 [facility] order, Performed in [facility]  hospital lab)  Once,   R     02/07/19 0222   Signed and Held  HIV antibody (Routine Testing)  Tomorrow morning,   R     Signed and Held   Signed and Held  Creatinine, serum  (enoxaparin (LOVENOX)    CrCl >/= 30 ml/min)  Weekly,   R    Comments: while on enoxaparin therapy    Signed and Held   Signed and Held  Basic metabolic panel  Tomorrow morning,   R     Signed and Held   Signed and Held  CBC WITH DIFFERENTIAL  Tomorrow morning,   R     Signed and Held          Vitals/Pain Today's Vitals   02/07/19 0238 02/07/19 0330 02/07/19 0400 02/07/19 0500  BP:  110/72 108/73 114/77  Pulse:  (!) 104 (!) 104 94  Resp:  (!) 21 18 19   SpO2: 100% 100% 98% 98%  PainSc:  0-No pain 0-No pain     Isolation Precautions No active isolations  Medications Medications   albuterol (VENTOLIN HFA) 108 (90 Base) MCG/ACT inhaler 8 puff (8 puffs Inhalation Given 02/07/19 0238)  hydrochlorothiazide (MICROZIDE) capsule 12.5 mg (has no administration in time range)  acetaminophen (TYLENOL) tablet 650 mg (has no administration in time range)    Or  acetaminophen (TYLENOL) suppository 650 mg (has no administration in time range)  methylPREDNISolone sodium succinate (SOLU-MEDROL) 125 mg/2 mL injection 60 mg (has no administration in time range)  doxycycline (VIBRA-TABS) tablet 100 mg (has no administration in time range)    Mobility walks     Focused Assessments Cardiac Assessment Handoff:  Cardiac Rhythm: Normal sinus rhythm Lab Results  Component Value Date   CKTOTAL 157 07/19/2010   CKMB (HH) 07/19/2010    8.2 CRITICAL VALUE NOTED.  VALUE IS CONSISTENT WITH PREVIOUSLY REPORTED AND CALLED VALUE.   TROPONINI <0.03 07/06/2015   No results found for: DDIMER Does the Patient currently have chest pain? No  , Pulmonary Assessment Handoff:  Lung sounds: Bilateral Breath Sounds: Diminished O2 Device: Nasal Cannula O2 Flow Rate (L/min): 3 L/min      R Recommendations: See Admitting Provider Note  Report given to:   Additional Notes:

## 2019-02-07 NOTE — Progress Notes (Signed)
Pt had just arrived to floor, got bathed and settled. HR and RR elevated. HR came back down. No acute distress noted. Will continue to monitor.    02/07/19 0902  Vitals  Temp 97.6 F (36.4 C)  BP (!) 141/86  MAP (mmHg) 101  BP Location Left Arm  BP Method Automatic  Patient Position (if appropriate) Lying  Pulse Rate (!) 102  Resp (!) 24  Oxygen Therapy  SpO2 100 %  O2 Device Nasal Cannula  O2 Flow Rate (L/min) 3 L/min  MEWS Score  MEWS RR 1  MEWS Pulse 1  MEWS Systolic 0  MEWS LOC 0  MEWS Temp 0  MEWS Score 2  MEWS Score Color Yellow  MEWS Assessment  Is this an acute change? No

## 2019-02-07 NOTE — ED Provider Notes (Signed)
Emergency Department Provider Note   I have reviewed the triage vital signs and the nursing notes.   HISTORY  Chief Complaint Respiratory Distress   HPI [name] is a 63 y.o. male who presents to the emergency department today secondary to respiratory distress.  Patient initially in respiratory distress and not able to give history.  Subsequently he stated that he admits to medication for last 3 to 4 days.  Then he told the nurse that he had done cocaine as well.  No fevers or cough.  No lower extremity swelling.  No other associated symptoms.  Has not tried any make it better.  Is progressively worsened over the last few days but got a lot worse tonight after using cocaine.   No other associated or modifying symptoms.    Past Medical History:  Diagnosis Date  . Anginal pain ([place])   . CAD (coronary artery disease)    4V CABG 10/31/13  . Cocaine abuse ([place])   . COPD (chronic obstructive pulmonary disease) ([place])   . GERD (gastroesophageal reflux disease)   . H/O: eczema    lower extremities  . H/O: GI bleed    a. 63 y/o ago, r/t ulcer. Denies recent GIB.  Marland Kitchen High cholesterol   . Hypertension   . Myocardial infarction ([place])   . Perforation of duodenal ulcer ([place])    a. 1970s - s/p surgery.   . Shortness of breath dyspnea   . Tobacco abuse     Patient Active Problem List   Diagnosis Date Noted  . CAD (coronary artery disease)   . CVA (cerebral vascular accident) ([place]) 01/01/2018  . Vision loss of left eye 11/04/2017  . GIB (gastrointestinal bleeding) 11/04/2017  . Hypokalemia 11/04/2017  . Carotid artery stenosis 11/04/2017  . Tobacco dependence 05/30/2016  . Acute respiratory failure with hypoxia and hypercapnia ([place]) 07/02/2015  . Cocaine abuse ([place]) 07/02/2015  . Chronic obstructive pulmonary disease ([place])   . Essential hypertension   . Acute encephalopathy   . Cardiomyopathy, ischemic 09/04/2014  . Protein-calorie malnutrition, severe ([place]) 09/02/2014   . COPD exacerbation ([place]) 09/01/2014  . Acute respiratory failure ([place]) 09/01/2014  . HTN (hypertension) 09/01/2014  . S/P CABG x 4 10/31/2013  . NSTEMI (non-ST elevated myocardial infarction) ([place]) 10/27/2013  . Pure hypercholesterolemia 10/27/2013  . Acute coronary syndrome ([place]) 10/26/2013  . Influenza A 05/19/2011  . Tobacco abuse 05/19/2011  . Acute bronchitis with chronic obstructive pulmonary disease (COPD) ([place]) 05/18/2011  . PUD (peptic ulcer disease) 05/18/2011    Past Surgical History:  Procedure Laterality Date  . BIOPSY  11/05/2017   Procedure: BIOPSY;  Surgeon: Wilford Corner, MD;  Location: [place];  Service: Endoscopy;;  . CORONARY ARTERY BYPASS GRAFT N/A 10/31/2013   Procedure: CORONARY ARTERY BYPASS GRAFTING TIMES FOUR ON PUMP USING LEFT INTERNAL MAMMARY ARTERY AND RIGHT GREATER SAPHENOUS VEIN VIA ENDOVEIN HARVEST.;  Surgeon: Melrose Nakayama, MD;  Location: [place];  Service: Open Heart Surgery;  Laterality: N/A;  . ENDARTERECTOMY Left 11/06/2017   Procedure: ENDARTERECTOMY CAROTID;  Surgeon: Conrad Colusa, MD;  Location: [facility];  Service: Vascular;  Laterality: Left;  . ESOPHAGOGASTRODUODENOSCOPY (EGD) WITH PROPOFOL N/A 11/05/2017   Procedure: ESOPHAGOGASTRODUODENOSCOPY (EGD) WITH PROPOFOL;  Surgeon: Wilford Corner, MD;  Location: [facility];  Service: Endoscopy;  Laterality: N/A;  . INTRAOPERATIVE TRANSESOPHAGEAL ECHOCARDIOGRAM N/A 10/31/2013   Procedure: INTRAOPERATIVE TRANSESOPHAGEAL ECHOCARDIOGRAM;  Surgeon: Melrose Nakayama, MD;  Location: [facility];  Service: Open Heart Surgery;  Laterality: N/A;  . LEFT HEART CATHETERIZATION WITH CORONARY ANGIOGRAM N/A 10/28/2013   Procedure: LEFT HEART CATHETERIZATION WITH CORONARY ANGIOGRAM;  Surgeon: Burnell Blanks, MD;  Location: [facility] CATH LAB;  Service: Cardiovascular;  Laterality: N/A;  . stomach sx      Current Outpatient Rx  . Order #: [id number] Class: Normal  . Order #: [id number] Class: Normal  . Order  #: [id number] Class: Normal  . Order #: [id number] Class: Normal  . Order #: [id number] Class: Historical Med  . Order #: [id number] Class: Normal  . Order #: [id number] Class: Normal  . Order #: [id number] Class: Normal  . Order #: [id number] Class: Print  . Order #: [id number] Class: Normal  . Order #: [id number] Class: Normal  . Order #: [id number] Class: Normal    Allergies Patient has no known allergies.  Family History  Problem Relation Age of Onset  . Lung cancer Mother   . Alzheimer's disease Mother   . Lung cancer Father   . HIV/AIDS Brother   . Obesity Brother   . Lung cancer Other     Social History Social History   Tobacco Use  . Smoking status: Current Every Day Smoker    Packs/day: 0.50    Years: 40.00    Pack years: 20.00    Types: Cigarettes  . Smokeless tobacco: Never Used  . Tobacco comment: starting Chantix 01/04/18  Substance Use Topics  . Alcohol use: Yes    Alcohol/week: 4.0 standard drinks    Types: 4 Standard drinks or equivalent per week    Comment: vodka  . Drug use: Yes    Types: Marijuana, Cocaine    Comment: "about 1 marijuana cigarette a week"    Review of Systems  All other systems negative except as documented in the HPI. All pertinent positives and negatives as reviewed in the HPI. ____________________________________________   PHYSICAL EXAM:  VITAL SIGNS: ED Triage Vitals [02/07/19 0238]   Vitals:   02/07/19 0330 02/07/19 0400  BP: 110/72 108/73  Pulse: (!) 104 (!) 104  Resp: (!) 21 18  SpO2: 100% 98%    Constitutional: Alert and oriented. Well appearing and in moderate respiratory distress Eyes: Conjunctivae are normal. PERRL. EOMI. Head: Atraumatic. Nose: No congestion/rhinnorhea. Mouth/Throat: Mucous membranes are dry.  Oropharynx non-erythematous. Neck: No stridor.  No meningeal signs.   Cardiovascular: hypertensive, tachycardic rate, regular rhythm. Good peripheral circulation. Grossly normal heart sounds.   Respiratory:  tachypneic respiratory effort.  Intercostal and supraclavicular retractions. Lungs diminished with wheezing in apices Gastrointestinal: Soft and nontender. No distention.  Musculoskeletal: No lower extremity tenderness nor edema. No gross deformities of extremities. Neurologic:  Normal speech and language. No gross focal neurologic deficits are appreciated.  Skin:  Skin is warm, dry and intact. No rash noted.   ____________________________________________   LABS (all labs ordered are listed, but only abnormal results are displayed)  Labs Reviewed  CBC WITH DIFFERENTIAL/PLATELET - Abnormal; Notable for the following components:      Result Value   WBC 12.3 (*)    Hemoglobin 9.7 (*)    HCT 35.6 (*)    MCV 69.7 (*)    MCH 19.0 (*)    MCHC 27.2 (*)    RDW 20.6 (*)    All other components within normal limits  COMPREHENSIVE METABOLIC PANEL - Abnormal; Notable for the following components:   Glucose, Bld 156 (*)    Alkaline Phosphatase 132 (*)    All other components within normal limits  POCT I-STAT 7, (LYTES, BLD GAS, ICA,H+H) - Abnormal; Notable for  the following components:   pH, Arterial 7.318 (*)    pCO2 arterial 48.3 (*)    pO2, Arterial 74.0 (*)    HCT 32.0 (*)    Hemoglobin 10.9 (*)    All other components within normal limits  SARS CORONAVIRUS 2 (HOSPITAL ORDER, PERFORMED IN [facility])  PROTIME-INR  BRAIN NATRIURETIC PEPTIDE  I-STAT VENOUS BLOOD GAS, ED  TYPE AND SCREEN  TROPONIN I (HIGH SENSITIVITY)   ____________________________________________  EKG   EKG Interpretation  Date/Time:    Ventricular Rate:    PR Interval:    QRS Duration:   QT Interval:    QTC Calculation:   R Axis:     Text Interpretation:         ____________________________________________  RADIOLOGY  Dg Chest Port 1 View  Result Date: 02/07/2019 CLINICAL DATA:  Respiratory distress EXAM: PORTABLE CHEST 1 VIEW COMPARISON:  08/08/2018 FINDINGS: Post sternotomy  changes. No consolidation or effusion. Normal cardiomediastinal silhouette. No pneumothorax. IMPRESSION: No active disease. Electronically Signed   By: Donavan Foil M.D.   On: 02/07/2019 03:49    ____________________________________________   PROCEDURES  Procedure(s) performed:   .Critical Care Performed by: Merrily Pew, MD Authorized by: Merrily Pew, MD   Critical care provider statement:    Critical care time (minutes):  45   Critical care was time spent personally by me on the following activities:  Discussions with consultants, evaluation of patient's response to treatment, examination of patient, ordering and performing treatments and interventions, ordering and review of laboratory studies, ordering and review of radiographic studies, pulse oximetry, re-evaluation of patient's condition, obtaining history from patient or surrogate and review of old charts     ____________________________________________   INITIAL IMPRESSION / [place] / ED COURSE  Initial evaluation consider intubation secondary to his work of breathing and not able to use BiPAP until his coronavirus test was negative.  Patient given 8 puffs of albuterol and blood gases drawn.  He he had a significant respiratory acidosis.  Within about 20 minutes of getting albuterol his work of breathing improved significantly from a respiratory rate of 50 to about 25.  His lungs opened up and the patient was able to speak.  This progressively improved over the next hour.  He was weaned down from 15 L to 3 L nasal cannula.  This is when the rest of the history came out.  X-ray overall looked clear by me radiology read not available at this time.  No antibiotics indicated at this time.  Steroids have been given.  We will continue with steroids and albuterol and likely observation secondary to his presentation.  Persistently improving RR and comfort level. BP/HR improved (likely related to cocaine and situation).    Discussed with Dr. Myna Hidalgo for admission.   Pertinent labs & imaging results that were available during my care of the patient were reviewed by me and considered in my medical decision making (see chart for details). ____________________________________________  FINAL CLINICAL IMPRESSION(S) / ED DIAGNOSES  Final diagnoses:  Respiratory distress  COPD exacerbation ([place])     MEDICATIONS GIVEN DURING THIS VISIT:  Medications  albuterol (VENTOLIN HFA) 108 (90 Base) MCG/ACT inhaler 8 puff (8 puffs Inhalation Given 02/07/19 0238)     NEW OUTPATIENT MEDICATIONS STARTED DURING THIS VISIT:  New Prescriptions   No medications on file    Note:  This note was prepared with assistance of Dragon voice recognition software. Occasional wrong-word or sound-a-like substitutions may have occurred  due to the inherent limitations of voice recognition software.   Cal Gindlesperger, Corene Cornea, MD 02/07/19 [phone]

## 2019-02-07 NOTE — Progress Notes (Signed)
Pt admitted to [facility] from ED. A&O x4. Skin intact. IV 20g R hand, SL, clean, dry, intact. Tele monitor placed. VS stable other than tachycardic. All questions/concerns addressed. Call bell within reach. Will continue to monitor.

## 2019-02-07 NOTE — ED Triage Notes (Signed)
See paper chart for additional documentation froin 01:45a.m. to 03:15 a.m.

## 2019-02-07 NOTE — ED Notes (Signed)
Opyd Md at bedside

## 2019-02-07 NOTE — Progress Notes (Addendum)
Patient admitted after midnight with acute respiratory failure with hyoxia secondary to copd exacerbation in setting of cocaine use. He also notes not having inhalers due to finances. Initially intubation considered due to resp distress and bipap not option. He was and provided with nebs and solumedrol. Quickly weaned improved and oxygen sat level 99% on 2L. Will continue nebs and solumedrol and air flow remains diminished. Also with elevated troponin likely related to demand in setting of cocaine. No chest pain. EKG sr with prologed QT.   #1. Acute respiratory failure with hypoxia secondary to copd exacerbation in setting cocaine use. ABG ph 7.31, Co2 48 O2 74 bicarb 24. Chest xray no active disease Oxygen sat level 99% on 2L -continue neb -continue solumedrol  #2.copd exacerbation likely triggered by cocaine use. Has run out of inhalers at home -see #1  #3. Elevated troponin. Related to demand ischemia and cocaine use. No chest pain. ekg as noted above. Hx of cad s/p cabg 2015, mi. -check one more to monitor trend -continue home meds  #4. Hypertension. Controlled. Home meds include HCTZ,  -continue home med  #5. Hyperglycemia. No hx of same. Serum glucose 158 -obtain A1c -OP follow up  #6. Tobacco use: cessation counseling   Santiago Glad m black, NP  Troponin trending up-- ? Demand in the setting of COPD/acute resp failure and cocaine use-- will get updated echo and monitor on tele JV

## 2019-02-07 NOTE — H&P (Signed)
History and Physical    [name] [id number] DOB: 11-14-1955 DOA: 02/07/2019  PCP: Lanae Boast, FNP   Patient coming from: Home   Chief Complaint: SOB   HPI: [name] is a 63 y.o. male with medical history significant for coronary artery disease status post CABG, cocaine abuse, COPD, hypertension, now presenting to the emergency department for evaluation of shortness of breath.  Patient reports that he ran out of his medications about a week ago due to financial constraints, began to develop some dyspnea over the past couple days with a cough productive of clear sputum but no fevers, chills, chest pain, or leg swelling.  He reports abstaining from illicit substances for years up until last night when he smoked cocaine and later experienced acute worsening in his dyspnea.  EMS was called and the patient was found to be in acute distress and saturating 70% on room air.  He was placed on nonrebreather and brought into the ED.  ED Course: Upon arrival to the ED, patient is found to be in acute respiratory distress, severely hypertensive, tachycardic, and breathing about 50 times a minute.  Chest x-ray was negative for acute cardiopulmonary disease, EKG features a diffuse repolarization abnormality and QTc 506 ms.  Chemistry panel is unremarkable and CBC notable for leukocytosis to 12,300 and a microcytic anemia with hemoglobin 9.7.  High-sensitivity troponin was normal.  Patient was given albuterol and IV Solu-Medrol in the ED.  He made a rapid improvement in the emergency department, but continues to be mildly dyspneic at rest and requiring 3 L/min of supplemental oxygen, and hospitalists are consulted for admission.  Review of Systems:  All other systems reviewed and apart from HPI, are negative.  Past Medical History:  Diagnosis Date   Anginal pain ([place])    CAD (coronary artery disease)    4V CABG 10/31/13   Cocaine abuse ([place])    COPD (chronic obstructive pulmonary  disease) (HCC)    GERD (gastroesophageal reflux disease)    H/O: eczema    lower extremities   H/O: GI bleed    a. 63 y/o ago, r/t ulcer. Denies recent GIB.   High cholesterol    Hypertension    Myocardial infarction ([place])    Perforation of duodenal ulcer ([place])    a. 1970s - s/p surgery.    Shortness of breath dyspnea    Tobacco abuse     Past Surgical History:  Procedure Laterality Date   BIOPSY  11/05/2017   Procedure: BIOPSY;  Surgeon: Wilford Corner, MD;  Location: [facility];  Service: Endoscopy;;   CORONARY ARTERY BYPASS GRAFT N/A 10/31/2013   Procedure: CORONARY ARTERY BYPASS GRAFTING TIMES FOUR ON PUMP USING LEFT INTERNAL MAMMARY ARTERY AND RIGHT GREATER SAPHENOUS VEIN VIA ENDOVEIN HARVEST.;  Surgeon: Melrose Nakayama, MD;  Location: [facility];  Service: Open Heart Surgery;  Laterality: N/A;   ENDARTERECTOMY Left 11/06/2017   Procedure: ENDARTERECTOMY CAROTID;  Surgeon: Conrad York, MD;  Location: [facility];  Service: Vascular;  Laterality: Left;   ESOPHAGOGASTRODUODENOSCOPY (EGD) WITH PROPOFOL N/A 11/05/2017   Procedure: ESOPHAGOGASTRODUODENOSCOPY (EGD) WITH PROPOFOL;  Surgeon: Wilford Corner, MD;  Location: [facility];  Service: Endoscopy;  Laterality: N/A;   INTRAOPERATIVE TRANSESOPHAGEAL ECHOCARDIOGRAM N/A 10/31/2013   Procedure: INTRAOPERATIVE TRANSESOPHAGEAL ECHOCARDIOGRAM;  Surgeon: Melrose Nakayama, MD;  Location: [facility];  Service: Open Heart Surgery;  Laterality: N/A;   LEFT HEART CATHETERIZATION WITH CORONARY ANGIOGRAM N/A 10/28/2013   Procedure: LEFT HEART CATHETERIZATION WITH CORONARY ANGIOGRAM;  Surgeon: Burnell Blanks, MD;  Location: [facility] CATH LAB;  Service: Cardiovascular;  Laterality: N/A;   stomach sx       reports that he has been smoking cigarettes. He has a 20.00 pack-year smoking history. He has never used smokeless tobacco. He reports current alcohol use of about 4.0 standard drinks of alcohol per week. He reports current drug  use. Drugs: Marijuana and Cocaine.  No Known Allergies  Family History  Problem Relation Age of Onset   Lung cancer Mother    Alzheimer's disease Mother    Lung cancer Father    HIV/AIDS Brother    Obesity Brother    Lung cancer Other      Prior to Admission medications   Medication Sig Start Date End Date Taking? Authorizing Provider  albuterol (VENTOLIN HFA) 108 (90 Base) MCG/ACT inhaler INHALE 2 PUFFS INTO THE LUNGS EVERY 6 HOURS AS NEEDED FOR WHEEZING OR SHORTNESS OF BREATH Patient taking differently: Inhale 2 puffs into the lungs every 6 (six) hours as needed for wheezing or shortness of breath.  02/06/19  Yes Lanae Boast, FNP  atorvastatin (LIPITOR) 80 MG tablet Take 1 tablet (80 mg total) by mouth daily at 6 PM. 07/23/18  Yes Lanae Boast, FNP  CHANTIX 1 MG tablet TAKE 1 TABLET (1 MG TOTAL) BY MOUTH 2 (TWO) TIMES DAILY. Patient taking differently: Take 1 mg by mouth 2 (two) times daily.  02/06/19  Yes Lanae Boast, FNP  clopidogrel (PLAVIX) 75 MG tablet TAKE 1 TABLET (75 MG TOTAL) BY MOUTH DAILY. 02/06/19  Yes Lanae Boast, FNP  fluticasone (FLONASE) 50 MCG/ACT nasal spray Place 2 sprays into both nostrils as needed for allergies or rhinitis.   Yes [provider]  fluticasone furoate-vilanterol (BREO ELLIPTA) 200-25 MCG/INH AEPB Inhale 1 puff into the lungs daily. 08/08/18  Yes Lanae Boast, FNP  hydrochlorothiazide (MICROZIDE) 12.5 MG capsule Take 1 capsule (12.5 mg total) by mouth daily. 07/23/18  Yes Lanae Boast, FNP  montelukast (SINGULAIR) 10 MG tablet TAKE 1 TABLET (10 MG TOTAL) BY MOUTH AT BEDTIME. 12/27/18  Yes Lanae Boast, FNP  Multiple Vitamin (MULTIVITAMIN WITH MINERALS) TABS tablet Take 1 tablet by mouth daily. 11/09/17  Yes Gherghe, Vella Redhead, MD  pantoprazole (PROTONIX) 40 MG tablet TAKE 1 TABLET (40 MG TOTAL) BY MOUTH DAILY. 02/06/19  Yes Lanae Boast, FNP  PROVENTIL HFA 108 (90 Base) MCG/ACT inhaler INHALE 2 PUFFS INTO THE LUNGS EVERY 6  HOURS AS NEEDED FOR WHEEZING OR SHORTNESS OF BREATH Patient taking differently: Inhale 1-2 puffs into the lungs every 6 (six) hours as needed for wheezing or shortness of breath.  05/01/18  Yes Lanae Boast, FNP  triamcinolone (KENALOG) 0.025 % ointment APPLY 1 APPLICATION TOPICALLY 2 TIMES DAILY. Patient taking differently: Apply 1 application topically 2 (two) times daily as needed (rash).  10/20/16  Yes Dorena Dew, FNP    Physical Exam: Vitals:   02/07/19 0238 02/07/19 0330 02/07/19 0400  BP:  110/72 108/73  Pulse:  (!) 104 (!) 104  Resp:  (!) 21 18  SpO2: 100% 100% 98%    Constitutional: NAD, calm  Eyes: PERTLA, lids and conjunctivae normal ENMT: Mucous membranes are moist. Posterior pharynx clear of any exudate or lesions.   Neck: normal, supple, no masses, no thyromegaly Respiratory: Diminished breath sounds bilaterally, prolonged expiratory phase, occasional wheeze. No pallor or cyanosis.  Cardiovascular: S1 & S2 heard, regular rate and rhythm. No extremity edema.   Abdomen: No distension, no tenderness, soft. Bowel sounds  active.  Musculoskeletal: no clubbing / cyanosis. No joint deformity upper and lower extremities.    Skin: no significant rashes, lesions, ulcers. Warm, dry, well-perfused. Neurologic: No gross facial asymmetry. Sensation intact. Moving all extremities.   Psychiatric:  Alert and oriented x 3. Pleasant, cooperative.    Labs on Admission: I have personally reviewed following labs and imaging studies  CBC: Recent Labs  Lab 02/07/19 0200 02/07/19 0357  WBC 12.3*  --   NEUTROABS PENDING  --   HGB 9.7* 10.9*  HCT 35.6* 32.0*  MCV 69.7*  --   PLT 400  --    Basic Metabolic Panel: Recent Labs  Lab 02/07/19 0200 02/07/19 0357  NA 141 143  K 4.4 3.8  CL 107  --   CO2 23  --   GLUCOSE 156*  --   BUN 10  --   CREATININE 1.14  --   CALCIUM 9.1  --    GFR: CrCl cannot be calculated (Unknown ideal weight.). Liver Function Tests: Recent  Labs  Lab 02/07/19 0200  AST 24  ALT 14  ALKPHOS 132*  BILITOT 1.0  PROT 7.6  ALBUMIN 4.2   No results for input(s): LIPASE, AMYLASE in the last 168 hours. No results for input(s): AMMONIA in the last 168 hours. Coagulation Profile: Recent Labs  Lab 02/07/19 0200  INR 1.0   Cardiac Enzymes: No results for input(s): CKTOTAL, CKMB, CKMBINDEX, TROPONINI in the last 168 hours. BNP (last 3 results) No results for input(s): PROBNP in the last 8760 hours. HbA1C: No results for input(s): HGBA1C in the last 72 hours. CBG: No results for input(s): GLUCAP in the last 168 hours. Lipid Profile: No results for input(s): CHOL, HDL, LDLCALC, TRIG, CHOLHDL, LDLDIRECT in the last 72 hours. Thyroid Function Tests: No results for input(s): TSH, T4TOTAL, FREET4, T3FREE, THYROIDAB in the last 72 hours. Anemia Panel: No results for input(s): VITAMINB12, FOLATE, FERRITIN, TIBC, IRON, RETICCTPCT in the last 72 hours. Urine analysis:    Component Value Date/Time   COLORURINE YELLOW 11/04/2017 [place] 11/04/2017 1652   LABSPEC 1.023 11/04/2017 1652   PHURINE 7.0 11/04/2017 1652   GLUCOSEU NEGATIVE 11/04/2017 1652   HGBUR NEGATIVE 11/04/2017 1652   BILIRUBINUR neg 11/07/2018 1446   KETONESUR NEGATIVE 11/04/2017 1652   PROTEINUR Negative 11/07/2018 1446   PROTEINUR NEGATIVE 11/04/2017 1652   UROBILINOGEN 0.2 11/07/2018 1446   UROBILINOGEN 0.2 06/28/2017 1152   NITRITE neg 11/07/2018 1446   NITRITE NEGATIVE 11/04/2017 1652   LEUKOCYTESUR Negative 11/07/2018 1446   Sepsis Labs: @LABRCNTIP (procalcitonin:4,lacticidven:4) )No results found for this or any previous visit (from the past 240 hour(s)).   Radiological Exams on Admission: Dg Chest Port 1 View  Result Date: 02/07/2019 CLINICAL DATA:  Respiratory distress EXAM: PORTABLE CHEST 1 VIEW COMPARISON:  08/08/2018 FINDINGS: Post sternotomy changes. No consolidation or effusion. Normal cardiomediastinal silhouette. No  pneumothorax. IMPRESSION: No active disease. Electronically Signed   By: Donavan Foil M.D.   On: 02/07/2019 03:49    EKG: Independently reviewed. Sinus rhythm, diffuse repolarization abnormality is more pronounced than on 2017/11/30, QTc 506 ms.   Assessment/Plan   1. COPD with acute exacerbation; acute hypoxic respiratory failure  - Presents with ~2 days of SOB and cough that worsened acutely after smoking cocaine  - He was in acute distress on arrival, CXR was clear, no chest pain and troponin normal, no evidence for PE, and he improved dramatically with albuterol and steroids  - Check sputum culture, follow-up  COVID-19 test in process (denies fever or contacts), continue systemic steroid, start doxycycline, continue ICS/LABA, and continue albuterol    2. CAD  - No anginal complaints, HS troponin normal  - Continue statin and Plavix   3. Hypertension  - BP was elevated on presentation and normalized as his respiratory distress improved  - Continue HCTZ as tolerated    4. Cocaine abuse  - Patient reports maintaining abstinence for years until shortly prior to presentation  - He expresses regret for the relapse, seems to be genuine in his plan to abstain going forward, and declines offer of social work consultation for possible resources to help him    5. Microcytic anemia  - Hgb appears to be stable at 9.6 but MCV is 69.7 and previously normal  - No bleeding, check anemia panel    6. Prolonged QT  - QTc is 506 ms in ED  - Continue cardiac monitoring, keep potassium 4 and mag 2, minimize QT-prolonging medications    PPE: Mask, face shield  DVT prophylaxis: Lovenox  Code Status: Full  Family Communication: Discussed with patient  Consults called: None  Admission status: Observation     Vianne Bulls, MD [facility] Pager [phone]  If 7PM-7AM, please contact night-coverage [email or web address] Password [facility]  02/07/2019, 5:19 AM

## 2019-02-07 NOTE — ED Notes (Signed)
Pt brought to room 38 and attached to the monitor. Pt complains of no pain or discomfort at this time. Pt requested breakfast, dietary will be notified. Will continue to monitor.

## 2019-02-08 ENCOUNTER — Observation Stay (HOSPITAL_BASED_OUTPATIENT_CLINIC_OR_DEPARTMENT_OTHER): Payer: Medicare Other

## 2019-02-08 DIAGNOSIS — I10 Essential (primary) hypertension: Secondary | ICD-10-CM | POA: Diagnosis present

## 2019-02-08 DIAGNOSIS — I340 Nonrheumatic mitral (valve) insufficiency: Secondary | ICD-10-CM | POA: Diagnosis not present

## 2019-02-08 DIAGNOSIS — Z20828 Contact with and (suspected) exposure to other viral communicable diseases: Secondary | ICD-10-CM | POA: Diagnosis present

## 2019-02-08 DIAGNOSIS — I2510 Atherosclerotic heart disease of native coronary artery without angina pectoris: Secondary | ICD-10-CM | POA: Diagnosis present

## 2019-02-08 DIAGNOSIS — T405X1A Poisoning by cocaine, accidental (unintentional), initial encounter: Secondary | ICD-10-CM | POA: Diagnosis present

## 2019-02-08 DIAGNOSIS — F1410 Cocaine abuse, uncomplicated: Secondary | ICD-10-CM | POA: Diagnosis present

## 2019-02-08 DIAGNOSIS — E7800 Pure hypercholesterolemia, unspecified: Secondary | ICD-10-CM | POA: Diagnosis present

## 2019-02-08 DIAGNOSIS — Z7902 Long term (current) use of antithrombotics/antiplatelets: Secondary | ICD-10-CM | POA: Diagnosis not present

## 2019-02-08 DIAGNOSIS — R739 Hyperglycemia, unspecified: Secondary | ICD-10-CM | POA: Diagnosis present

## 2019-02-08 DIAGNOSIS — J9602 Acute respiratory failure with hypercapnia: Secondary | ICD-10-CM | POA: Diagnosis not present

## 2019-02-08 DIAGNOSIS — Z79899 Other long term (current) drug therapy: Secondary | ICD-10-CM | POA: Diagnosis not present

## 2019-02-08 DIAGNOSIS — Z951 Presence of aortocoronary bypass graft: Secondary | ICD-10-CM | POA: Diagnosis not present

## 2019-02-08 DIAGNOSIS — J441 Chronic obstructive pulmonary disease with (acute) exacerbation: Secondary | ICD-10-CM | POA: Diagnosis present

## 2019-02-08 DIAGNOSIS — Z7951 Long term (current) use of inhaled steroids: Secondary | ICD-10-CM | POA: Diagnosis not present

## 2019-02-08 DIAGNOSIS — K219 Gastro-esophageal reflux disease without esophagitis: Secondary | ICD-10-CM | POA: Diagnosis present

## 2019-02-08 DIAGNOSIS — F17210 Nicotine dependence, cigarettes, uncomplicated: Secondary | ICD-10-CM | POA: Diagnosis present

## 2019-02-08 DIAGNOSIS — J9601 Acute respiratory failure with hypoxia: Secondary | ICD-10-CM | POA: Diagnosis not present

## 2019-02-08 DIAGNOSIS — D509 Iron deficiency anemia, unspecified: Secondary | ICD-10-CM | POA: Diagnosis present

## 2019-02-08 DIAGNOSIS — Z801 Family history of malignant neoplasm of trachea, bronchus and lung: Secondary | ICD-10-CM | POA: Diagnosis not present

## 2019-02-08 DIAGNOSIS — I252 Old myocardial infarction: Secondary | ICD-10-CM | POA: Diagnosis not present

## 2019-02-08 DIAGNOSIS — R0602 Shortness of breath: Secondary | ICD-10-CM | POA: Diagnosis present

## 2019-02-08 DIAGNOSIS — I248 Other forms of acute ischemic heart disease: Secondary | ICD-10-CM | POA: Diagnosis present

## 2019-02-08 LAB — CBC WITH DIFFERENTIAL/PLATELET
Abs Immature Granulocytes: 0.1 10*3/uL — ABNORMAL HIGH (ref 0.00–0.07)
Basophils Absolute: 0 10*3/uL (ref 0.0–0.1)
Basophils Relative: 0 %
Eosinophils Absolute: 0 10*3/uL (ref 0.0–0.5)
Eosinophils Relative: 0 %
HCT: 29.2 % — ABNORMAL LOW (ref 39.0–52.0)
Hemoglobin: 8.4 g/dL — ABNORMAL LOW (ref 13.0–17.0)
Lymphocytes Relative: 8 %
Lymphs Abs: 0.5 10*3/uL — ABNORMAL LOW (ref 0.7–4.0)
MCH: 19.1 pg — ABNORMAL LOW (ref 26.0–34.0)
MCHC: 28.8 g/dL — ABNORMAL LOW (ref 30.0–36.0)
MCV: 66.5 fL — ABNORMAL LOW (ref 80.0–100.0)
Metamyelocytes Relative: 2 %
Monocytes Absolute: 0 10*3/uL — ABNORMAL LOW (ref 0.1–1.0)
Monocytes Relative: 0 %
Neutro Abs: 5.2 10*3/uL (ref 1.7–7.7)
Neutrophils Relative %: 90 %
Platelets: 342 10*3/uL (ref 150–400)
RBC: 4.39 MIL/uL (ref 4.22–5.81)
RDW: 19.1 % — ABNORMAL HIGH (ref 11.5–15.5)
WBC: 5.8 10*3/uL (ref 4.0–10.5)
nRBC: 0.3 % — ABNORMAL HIGH (ref 0.0–0.2)
nRBC: 2 /100 WBC — ABNORMAL HIGH

## 2019-02-08 LAB — FOLATE: Folate: 5.7 ng/mL — ABNORMAL LOW (ref >5.9)

## 2019-02-08 LAB — VITAMIN B12: Vitamin B-12: 231 pg/mL (ref 180–914)

## 2019-02-08 LAB — MRSA PCR SCREENING: MRSA by PCR: NEGATIVE

## 2019-02-08 LAB — ECHOCARDIOGRAM COMPLETE
Height: 73 in
Weight: 2991.2 oz

## 2019-02-08 LAB — RETICULOCYTES
Immature Retic Fract: 21.1 % — ABNORMAL HIGH (ref 2.3–15.9)
RBC.: 4.39 MIL/uL (ref 4.22–5.81)
Retic Count, Absolute: 36.9 10*3/uL (ref 19.0–186.0)
Retic Ct Pct: 0.8 % (ref 0.4–3.1)

## 2019-02-08 LAB — BASIC METABOLIC PANEL
Anion gap: 11 (ref 5–15)
BUN: 13 mg/dL (ref 8–23)
CO2: 24 mmol/L (ref 22–32)
Calcium: 8.9 mg/dL (ref 8.9–10.3)
Chloride: 104 mmol/L (ref 98–111)
Creatinine, Ser: 1.1 mg/dL (ref 0.61–1.24)
GFR calc Af Amer: >60 mL/min (ref >60)
GFR calc non Af Amer: >60 mL/min (ref >60)
Glucose, Bld: 160 mg/dL — ABNORMAL HIGH (ref 70–99)
Potassium: 3.6 mmol/L (ref 3.5–5.1)
Sodium: 139 mmol/L (ref 135–145)

## 2019-02-08 LAB — HIV ANTIBODY (ROUTINE TESTING W REFLEX): HIV Screen 4th Generation wRfx: NONREACTIVE

## 2019-02-08 LAB — IRON AND TIBC
Iron: 15 ug/dL — ABNORMAL LOW (ref 45–182)
Saturation Ratios: 3 % — ABNORMAL LOW (ref 17.9–39.5)
TIBC: 469 ug/dL — ABNORMAL HIGH (ref 250–450)
UIBC: 454 ug/dL

## 2019-02-08 LAB — TROPONIN I (HIGH SENSITIVITY): Troponin I (High Sensitivity): 87 ng/L — ABNORMAL HIGH (ref <18)

## 2019-02-08 LAB — FERRITIN: Ferritin: 7 ng/mL — ABNORMAL LOW (ref 24–336)

## 2019-02-08 LAB — GLUCOSE, CAPILLARY: Glucose-Capillary: 246 mg/dL — ABNORMAL HIGH (ref 70–99)

## 2019-02-08 MED ORDER — METHYLPREDNISOLONE SODIUM SUCC 40 MG IJ SOLR
40.0000 mg | Freq: Four times a day (QID) | INTRAMUSCULAR | Status: DC
  Administered 2019-02-08 – 2019-02-09 (×4): 40 mg via INTRAVENOUS
  Filled 2019-02-08 (×4): qty 1

## 2019-02-08 MED ORDER — FLUTICASONE FUROATE-VILANTEROL 200-25 MCG/INH IN AEPB
1.0000 | INHALATION_SPRAY | Freq: Every day | RESPIRATORY_TRACT | Status: DC
  Administered 2019-02-08 – 2019-02-09 (×2): 1 via RESPIRATORY_TRACT

## 2019-02-08 NOTE — Progress Notes (Signed)
  Echocardiogram 2D Echocardiogram has been performed.  [name] 02/08/2019, 10:40 AM

## 2019-02-08 NOTE — Plan of Care (Signed)
  Problem: Elimination: Goal: Will not experience complications related to bowel motility Outcome: Not Progressing  Patient constipated. Medication provided as ordered.

## 2019-02-08 NOTE — Progress Notes (Signed)
1300: Patient placed on RA. Sats 99-100%

## 2019-02-08 NOTE — Progress Notes (Signed)
Progress Note    [name]  [id number] DOB: 23-Sep-1955  DOA: 02/07/2019 PCP: Lanae Boast, FNP    Brief Narrative:     Medical records reviewed and are as summarized below:  [name] is an 63 y.o. male with medical history significant for coronary artery disease status post CABG, cocaine abuse, COPD, hypertension, now presenting to the emergency department for evaluation of shortness of breath.  Patient reports that he ran out of his medications about a week ago due to financial constraints, began to develop some dyspnea over the past couple days with a cough productive of clear sputum but no fevers, chills, chest pain, or leg swelling.  Assessment/Plan:   Principal Problem:   Acute respiratory failure with hypoxia and hypercapnia (HCC) Active Problems:   PUD (peptic ulcer disease)   COPD exacerbation (HCC)   Acute respiratory failure (HCC)   HTN (hypertension)   CAD (coronary artery disease)   Prolonged QT interval   Elevated troponin   Hyperglycemia  #1. Acute respiratory failure with hypoxia secondary to copd exacerbation in setting cocaine use. -Chest xray no active disease Oxygen sat level 99% on 2L -wean off O2 and do home O2 evaluation-- ordered placed but not yet done -continue nebs -continue solumedrol-- wean from 60 mg to 40 mg  #2.copd exacerbation likely triggered by cocaine use and running out of inhalers at home -see #1  #3. Elevated troponin. Related to demand ischemia and cocaine use. No chest pain. ekg as noted above. Hx of cad s/p cabg 2015, mi. -trending down -echo: The left ventricle has normal systolic function with an ejection fraction of 60-65%. The cavity size was normal. There is moderately increased left ventricular wall thickness. Left ventricular diastolic Doppler parameters are consistent with impaired  relaxation. Indeterminate filling pressures The E/e' is 8-15. No evidence of left ventricular regional wall motion  abnormalities.  #4. Hypertension. Controlled. Home meds include HCTZ,  -continue home meds  #5. Tobacco use: cessation counseling   Family Communication/Anticipated D/C date and plan/Code Status   DVT prophylaxis: Lovenox ordered. Code Status: Full Code.  Family Communication:  Disposition Plan: home in 24-48 hours pending ability to wean to room air    Subjective:   Still on O2 Denies chest pain  Objective:    Vitals:   02/08/19 0804 02/08/19 1300 02/08/19 1339 02/08/19 1426  BP:   135/80   Pulse: (!) 101  100 (!) 106  Resp: 16  18 18   Temp:   98 F (36.7 C)   TempSrc:      SpO2: 99% 99% 100% 97%  Weight:      Height:        Intake/Output Summary (Last 24 hours) at 02/08/2019 1451 Last data filed at 02/08/2019 1247 Gross per 24 hour  Intake 1329 ml  Output 1875 ml  Net -546 ml   Filed Weights   02/07/19 0728  Weight: 84.8 kg    Exam: Pleasant and cooperative rrr Diminished breath sounds with occasional wheeze No LE edema A+OX3  Data Reviewed:   I have personally reviewed following labs and imaging studies:  Labs: Labs show the following:   Basic Metabolic Panel: Recent Labs  Lab 02/07/19 0200 02/07/19 0205 02/07/19 0223 02/07/19 0357 02/08/19 0348  NA 141 141 142 143 139  K 4.4 4.1 4.2 3.8 3.6  CL 107  --   --   --  104  CO2 23  --   --   --  24  GLUCOSE 156*  --   --   --  160*  BUN 10  --   --   --  13  CREATININE 1.14  --   --   --  1.10  CALCIUM 9.1  --   --   --  8.9   GFR Estimated Creatinine Clearance: 75.7 mL/min (by C-G formula based on SCr of 1.1 mg/dL). Liver Function Tests: Recent Labs  Lab 02/07/19 0200  AST 24  ALT 14  ALKPHOS 132*  BILITOT 1.0  PROT 7.6  ALBUMIN 4.2   No results for input(s): LIPASE, AMYLASE in the last 168 hours. No results for input(s): AMMONIA in the last 168 hours. Coagulation profile Recent Labs  Lab 02/07/19 0200  INR 1.0    CBC: Recent Labs  Lab 02/07/19 0200 02/07/19  0205 02/07/19 0223 02/07/19 0357 02/08/19 0348  WBC 12.3*  --   --   --  5.8  NEUTROABS 6.8  --   --   --  5.2  HGB 9.7* 11.9* 11.9* 10.9* 8.4*  HCT 35.6* 35.0* 35.0* 32.0* 29.2*  MCV 69.7*  --   --   --  66.5*  PLT 400  --   --   --  342   Cardiac Enzymes: No results for input(s): CKTOTAL, CKMB, CKMBINDEX, TROPONINI in the last 168 hours. BNP (last 3 results) No results for input(s): PROBNP in the last 8760 hours. CBG: Recent Labs  Lab 02/08/19 0833  GLUCAP 246*   D-Dimer: No results for input(s): DDIMER in the last 72 hours. Hgb A1c: Recent Labs    02/07/19 1517  HGBA1C 6.0*   Lipid Profile: No results for input(s): CHOL, HDL, LDLCALC, TRIG, CHOLHDL, LDLDIRECT in the last 72 hours. Thyroid function studies: No results for input(s): TSH, T4TOTAL, T3FREE, THYROIDAB in the last 72 hours.  Invalid input(s): FREET3 Anemia work up: Recent Labs    02/08/19 0348  VITAMINB12 231  FOLATE 5.7*  FERRITIN 7*  TIBC 469*  IRON 15*  RETICCTPCT 0.8   Sepsis Labs: Recent Labs  Lab 02/07/19 0200 02/08/19 0348  WBC 12.3* 5.8    Microbiology Recent Results (from the past 240 hour(s))  SARS Coronavirus 2 [facility] order, Performed in [place] hospital lab)     Status: None   Collection Time: 02/07/19  2:22 AM  Result Value Ref Range Status   SARS Coronavirus 2 NEGATIVE NEGATIVE Final    Comment: (NOTE) If result is NEGATIVE SARS-CoV-2 target nucleic acids are NOT DETECTED. The SARS-CoV-2 RNA is generally detectable in upper and lower  respiratory specimens during the acute phase of infection. The lowest  concentration of SARS-CoV-2 viral copies this assay can detect is 250  copies / mL. A negative result does not preclude SARS-CoV-2 infection  and should not be used as the sole basis for treatment or other  patient management decisions.  A negative result may occur with  improper specimen collection / handling, submission of specimen other  than nasopharyngeal  swab, presence of viral mutation(s) within the  areas targeted by this assay, and inadequate number of viral copies  (<250 copies / mL). A negative result must be combined with clinical  observations, patient history, and epidemiological information. If result is POSITIVE SARS-CoV-2 target nucleic acids are DETECTED. The SARS-CoV-2 RNA is generally detectable in upper and lower  respiratory specimens dur ing the acute phase of infection.  Positive  results are indicative of active infection with SARS-CoV-2.  Clinical  correlation  with patient history and other diagnostic information is  necessary to determine patient infection status.  Positive results do  not rule out bacterial infection or co-infection with other viruses. If result is PRESUMPTIVE POSTIVE SARS-CoV-2 nucleic acids MAY BE PRESENT.   A presumptive positive result was obtained on the submitted specimen  and confirmed on repeat testing.  While 2019 novel coronavirus  (SARS-CoV-2) nucleic acids may be present in the submitted sample  additional confirmatory testing may be necessary for epidemiological  and / or clinical management purposes  to differentiate between  SARS-CoV-2 and other Sarbecovirus currently known to infect humans.  If clinically indicated additional testing with an alternate test  methodology [id number]) is advised. The SARS-CoV-2 RNA is generally  detectable in upper and lower respiratory sp ecimens during the acute  phase of infection. The expected result is Negative. Fact Sheet for Patients:  StrictlyIdeas.no Fact Sheet for Healthcare Providers: BankingDealers.co.za This test is not yet approved or cleared by the [place] FDA and has been authorized for detection and/or diagnosis of SARS-CoV-2 by FDA under an Emergency Use Authorization (EUA).  This EUA will remain in effect (meaning this test can be used) for the duration of the COVID-19 declaration  under Section 564(b)(1) of the Act, 21 U.S.C. section 360bbb-3(b)(1), unless the authorization is terminated or revoked sooner. Performed at [facility], [place] [address] [place], [place] [address]   MRSA PCR Screening     Status: None   Collection Time: 02/07/19 11:21 PM   Specimen: Nasal Mucosa; Nasopharyngeal  Result Value Ref Range Status   MRSA by PCR NEGATIVE NEGATIVE Final    Comment:        The GeneXpert MRSA Assay (FDA approved for NASAL specimens only), is one component of a comprehensive MRSA colonization surveillance program. It is not intended to diagnose MRSA infection nor to guide or monitor treatment for MRSA infections. Performed at [facility], [place] [address], [place], [place] [address]     Procedures and diagnostic studies:  Dg Chest Port 1 View  Result Date: 02/07/2019 CLINICAL DATA:  Respiratory distress EXAM: PORTABLE CHEST 1 VIEW COMPARISON:  08/08/2018 FINDINGS: Post sternotomy changes. No consolidation or effusion. Normal cardiomediastinal silhouette. No pneumothorax. IMPRESSION: No active disease. Electronically Signed   By: Donavan Foil M.D.   On: 02/07/2019 03:49    Medications:   . atorvastatin  80 mg Oral q1800  . clopidogrel  75 mg Oral Daily  . doxycycline  100 mg Oral BID  . enoxaparin (LOVENOX) injection  40 mg Subcutaneous Q24H  . fluticasone furoate-vilanterol  1 puff Inhalation Daily  . hydrochlorothiazide  12.5 mg Oral Daily  . ipratropium-albuterol  3 mL Nebulization TID  . methylPREDNISolone (SOLU-MEDROL) injection  40 mg Intravenous Q6H  . multivitamin with minerals  1 tablet Oral Q24H  . nicotine  21 mg Transdermal Daily  . pantoprazole  40 mg Oral Daily  . sodium chloride flush  3 mL Intravenous Q12H   Continuous Infusions: . sodium chloride       LOS: 0 days   Geradine Girt  [facility]   How to contact the [facility] Attending or Consulting provider [place] or covering provider during after hours [place], for this patient?  1. Check the care team in [facility] and look for a) attending/consulting [facility] provider listed and b) the [facility] team listed 2. Log into [email or web address] and use [place]'s universal password to access. If you do not  have the password, please contact the hospital operator. 3. Locate the [facility] provider you are looking for under [facility] and page to a number that you can be directly reached. 4. If you still have difficulty reaching the provider, please page the [facility] (Director on Call) for the Hospitalists listed on amion for assistance.  02/08/2019, 2:51 PM

## 2019-02-09 LAB — GLUCOSE, CAPILLARY: Glucose-Capillary: 159 mg/dL — ABNORMAL HIGH (ref 70–99)

## 2019-02-09 MED ORDER — VITAMIN B-12 100 MCG PO TABS
500.0000 ug | ORAL_TABLET | Freq: Every day | ORAL | Status: DC
  Administered 2019-02-09: 500 ug via ORAL
  Filled 2019-02-09: qty 5

## 2019-02-09 MED ORDER — DOXYCYCLINE HYCLATE 100 MG PO TABS: 100.0000 mg | ORAL_TABLET | Freq: Two times a day (BID) | ORAL | 0 refills | Status: DC

## 2019-02-09 MED ORDER — SODIUM CHLORIDE 0.9 % IV SOLN
510.0000 mg | Freq: Once | INTRAVENOUS | Status: AC
  Administered 2019-02-09: 13:00:00 510 mg via INTRAVENOUS
  Filled 2019-02-09: qty 17

## 2019-02-09 MED ORDER — CYANOCOBALAMIN 500 MCG PO TABS: 500.0000 ug | ORAL_TABLET | Freq: Every day | ORAL | 0 refills | Status: DC

## 2019-02-09 MED ORDER — PREDNISONE 10 MG PO TABS: ORAL_TABLET | ORAL | 0 refills | Status: DC

## 2019-02-09 MED ORDER — FLEET ENEMA 7-19 GM/118ML RE ENEM
1.0000 | ENEMA | Freq: Once | RECTAL | Status: AC
  Administered 2019-02-09: 1 via RECTAL
  Filled 2019-02-09: qty 1

## 2019-02-09 MED ORDER — FERROUS FUMARATE 325 (106 FE) MG PO TABS: 1.0000 | ORAL_TABLET | Freq: Every day | ORAL | 0 refills | Status: DC

## 2019-02-09 MED ORDER — PREDNISONE 20 MG PO TABS: 40.0000 mg | ORAL_TABLET | Freq: Every day | ORAL | Status: DC

## 2019-02-09 MED ORDER — IPRATROPIUM-ALBUTEROL 0.5-2.5 (3) MG/3ML IN SOLN
3.0000 mL | Freq: Two times a day (BID) | RESPIRATORY_TRACT | Status: DC
  Administered 2019-02-09: 3 mL via RESPIRATORY_TRACT
  Filled 2019-02-09: qty 3

## 2019-02-09 NOTE — Discharge Summary (Signed)
Physician Discharge Summary  [name] [id number] DOB: 1956/05/08 DOA: 02/07/2019  PCP: Lanae Boast, FNP  Admit date: 02/07/2019 Discharge date: 02/09/2019  Admitted From: home Discharge disposition: home   Recommendations for Outpatient Follow-Up:   1. Needs referral for colonoscopy 2. CBC in 1 week 3. Smoking cessation   Discharge Diagnosis:   Principal Problem:   Acute respiratory failure with hypoxia and hypercapnia (HCC) Active Problems:   PUD (peptic ulcer disease)   COPD exacerbation (HCC)   Acute respiratory failure (HCC)   HTN (hypertension)   CAD (coronary artery disease)   Prolonged QT interval   Elevated troponin   Hyperglycemia    Discharge Condition: Improved.  Diet recommendation: Low sodium, heart healthy.  Wound care: None.  Code status: Full.   History of Present Illness:   [name] is a 63 y.o. male with medical history significant for coronary artery disease status post CABG, cocaine abuse, COPD, hypertension, now presenting to the emergency department for evaluation of shortness of breath.  Patient reports that he ran out of his medications about a week ago due to financial constraints, began to develop some dyspnea over the past couple days with a cough productive of clear sputum but no fevers, chills, chest pain, or leg swelling.  He reports abstaining from illicit substances for years up until last night when he smoked cocaine and later experienced acute worsening in his dyspnea.  EMS was called and the patient was found to be in acute distress and saturating 70% on room air.  He was placed on nonrebreather and brought into the ED.   Hospital Course by Problem:   #1. Acute respiratory failure with hypoxia secondary to copd exacerbation in setting cocaine use. -Chest xray no active disease Oxygen sat level 99% on 2L -wean off O2  -doxy -prednisone taper -patient to take home inhalers from hospital as he ran out  previously  #2.copd exacerbation likely triggered by cocaine use and running out of inhalers at home -see #1  #3. Elevated troponin. Related to demand ischemia and cocaine use. No chest pain. ekg as noted above. Hx of cad s/p cabg 2015, mi. -trending down -echo: The left ventricle has normal systolic function with an ejection fraction of 60-65%. The cavity size was normal. There is moderately increased left ventricular wall thickness. Left ventricular diastolic Doppler parameters are consistent with impaired relaxation. Indeterminate filling pressures The E/e' is 8-15. No evidence of left ventricular regional wall motion abnormalities.   #4. Hypertension. Controlled. Home meds include HCTZ,  -continue home meds  #5. Tobacco use: cessation counseling  #6 fe def anemia -needs colonoscopy -IV Fe x 1 -PO Fe -will also replace B12 (lower end of normal)   Medical Consultants:      Discharge Exam:   Vitals:   02/09/19 0829 02/09/19 1301  BP:  134/76  Pulse:  91  Resp:  18  Temp:  98.2 F (36.8 C)  SpO2: 99% 100%   Vitals:   02/08/19 2310 02/09/19 0614 02/09/19 0829 02/09/19 1301  BP: 139/89 124/75  134/76  Pulse: 98 88  91  Resp: 18 17  18   Temp: (!) 97.5 F (36.4 C) 98 F (36.7 C)  98.2 F (36.8 C)  TempSrc:      SpO2: 99% 98% 99% 100%  Weight:      Height:        General exam: Appears calm and comfortable.   The results of significant diagnostics from  this hospitalization (including imaging, microbiology, ancillary and laboratory) are listed below for reference.     Procedures and Diagnostic Studies:   Dg Chest Port 1 View  Result Date: 02/07/2019 CLINICAL DATA:  Respiratory distress EXAM: PORTABLE CHEST 1 VIEW COMPARISON:  08/08/2018 FINDINGS: Post sternotomy changes. No consolidation or effusion. Normal cardiomediastinal silhouette. No pneumothorax. IMPRESSION: No active disease. Electronically Signed   By: Donavan Foil M.D.   On: 02/07/2019 03:49      Labs:   Basic Metabolic Panel: Recent Labs  Lab 02/07/19 0200 02/07/19 0205 02/07/19 0223 02/07/19 0357 02/08/19 0348  NA 141 141 142 143 139  K 4.4 4.1 4.2 3.8 3.6  CL 107  --   --   --  104  CO2 23  --   --   --  24  GLUCOSE 156*  --   --   --  160*  BUN 10  --   --   --  13  CREATININE 1.14  --   --   --  1.10  CALCIUM 9.1  --   --   --  8.9   GFR Estimated Creatinine Clearance: 75.7 mL/min (by C-G formula based on SCr of 1.1 mg/dL). Liver Function Tests: Recent Labs  Lab 02/07/19 0200  AST 24  ALT 14  ALKPHOS 132*  BILITOT 1.0  PROT 7.6  ALBUMIN 4.2   No results for input(s): LIPASE, AMYLASE in the last 168 hours. No results for input(s): AMMONIA in the last 168 hours. Coagulation profile Recent Labs  Lab 02/07/19 0200  INR 1.0    CBC: Recent Labs  Lab 02/07/19 0200 02/07/19 0205 02/07/19 0223 02/07/19 0357 02/08/19 0348  WBC 12.3*  --   --   --  5.8  NEUTROABS 6.8  --   --   --  5.2  HGB 9.7* 11.9* 11.9* 10.9* 8.4*  HCT 35.6* 35.0* 35.0* 32.0* 29.2*  MCV 69.7*  --   --   --  66.5*  PLT 400  --   --   --  342   Cardiac Enzymes: No results for input(s): CKTOTAL, CKMB, CKMBINDEX, TROPONINI in the last 168 hours. BNP: Invalid input(s): POCBNP CBG: Recent Labs  Lab 02/08/19 0833 02/09/19 0827  GLUCAP 246* 159*   D-Dimer No results for input(s): DDIMER in the last 72 hours. Hgb A1c Recent Labs    02/07/19 1517  HGBA1C 6.0*   Lipid Profile No results for input(s): CHOL, HDL, LDLCALC, TRIG, CHOLHDL, LDLDIRECT in the last 72 hours. Thyroid function studies No results for input(s): TSH, T4TOTAL, T3FREE, THYROIDAB in the last 72 hours.  Invalid input(s): FREET3 Anemia work up Recent Labs    02/08/19 0348  VITAMINB12 231  FOLATE 5.7*  FERRITIN 7*  TIBC 469*  IRON 15*  RETICCTPCT 0.8   Microbiology Recent Results (from the past 240 hour(s))  SARS Coronavirus 2 [facility] order, Performed in [place] hospital lab)     Status:  None   Collection Time: 02/07/19  2:22 AM  Result Value Ref Range Status   SARS Coronavirus 2 NEGATIVE NEGATIVE Final    Comment: (NOTE) If result is NEGATIVE SARS-CoV-2 target nucleic acids are NOT DETECTED. The SARS-CoV-2 RNA is generally detectable in upper and lower  respiratory specimens during the acute phase of infection. The lowest  concentration of SARS-CoV-2 viral copies this assay can detect is 250  copies / mL. A negative result does not preclude SARS-CoV-2 infection  and should not be used as  the sole basis for treatment or other  patient management decisions.  A negative result may occur with  improper specimen collection / handling, submission of specimen other  than nasopharyngeal swab, presence of viral mutation(s) within the  areas targeted by this assay, and inadequate number of viral copies  (<250 copies / mL). A negative result must be combined with clinical  observations, patient history, and epidemiological information. If result is POSITIVE SARS-CoV-2 target nucleic acids are DETECTED. The SARS-CoV-2 RNA is generally detectable in upper and lower  respiratory specimens dur ing the acute phase of infection.  Positive  results are indicative of active infection with SARS-CoV-2.  Clinical  correlation with patient history and other diagnostic information is  necessary to determine patient infection status.  Positive results do  not rule out bacterial infection or co-infection with other viruses. If result is PRESUMPTIVE POSTIVE SARS-CoV-2 nucleic acids MAY BE PRESENT.   A presumptive positive result was obtained on the submitted specimen  and confirmed on repeat testing.  While 2019 novel coronavirus  (SARS-CoV-2) nucleic acids may be present in the submitted sample  additional confirmatory testing may be necessary for epidemiological  and / or clinical management purposes  to differentiate between  SARS-CoV-2 and other Sarbecovirus currently known to infect  humans.  If clinically indicated additional testing with an alternate test  methodology [phone]) is advised. The SARS-CoV-2 RNA is generally  detectable in upper and lower respiratory sp ecimens during the acute  phase of infection. The expected result is Negative. Fact Sheet for Patients:  StrictlyIdeas.no Fact Sheet for Healthcare Providers: BankingDealers.co.za This test is not yet approved or cleared by the [place] FDA and has been authorized for detection and/or diagnosis of SARS-CoV-2 by FDA under an Emergency Use Authorization (EUA).  This EUA will remain in effect (meaning this test can be used) for the duration of the COVID-19 declaration under Section 564(b)(1) of the Act, 21 U.S.C. section 360bbb-3(b)(1), unless the authorization is terminated or revoked sooner. Performed at [facility], [place] [address], [place], [place] [address]   MRSA PCR Screening     Status: None   Collection Time: 02/07/19 11:21 PM   Specimen: Nasal Mucosa; Nasopharyngeal  Result Value Ref Range Status   MRSA by PCR NEGATIVE NEGATIVE Final    Comment:        The GeneXpert MRSA Assay (FDA approved for NASAL specimens only), is one component of a comprehensive MRSA colonization surveillance program. It is not intended to diagnose MRSA infection nor to guide or monitor treatment for MRSA infections. Performed at [facility], [place] [address] [place], [place] [address]      Discharge Instructions:   Discharge Instructions    Diet - low sodium heart healthy   Complete by: As directed    Discharge instructions   Complete by: As directed    Referral for colonoscopy   Increase activity slowly   Complete by: As directed      Allergies as of 02/09/2019   No Known Allergies     Medication List    TAKE these medications   albuterol 108 (90 Base) MCG/ACT inhaler Commonly known as: Ventolin HFA INHALE 2 PUFFS INTO THE LUNGS  EVERY 6 HOURS AS NEEDED FOR WHEEZING OR SHORTNESS OF BREATH What changed:   how much to take  how to take this  when to take this  reasons to take this  additional instructions  Another medication with the same name was  removed. Continue taking this medication, and follow the directions you see here.   atorvastatin 80 MG tablet Commonly known as: LIPITOR Take 1 tablet (80 mg total) by mouth daily at 6 PM.   Chantix 1 MG tablet Generic drug: varenicline TAKE 1 TABLET (1 MG TOTAL) BY MOUTH 2 (TWO) TIMES DAILY. What changed: See the new instructions.   clopidogrel 75 MG tablet Commonly known as: PLAVIX TAKE 1 TABLET (75 MG TOTAL) BY MOUTH DAILY.   doxycycline 100 MG tablet Commonly known as: VIBRA-TABS Take 1 tablet (100 mg total) by mouth 2 (two) times daily.   ferrous fumarate 325 (106 Fe) MG Tabs tablet Commonly known as: HEMOCYTE - 106 mg FE Take 1 tablet (106 mg of iron total) by mouth daily.   fluticasone 50 MCG/ACT nasal spray Commonly known as: FLONASE Place 2 sprays into both nostrils as needed for allergies or rhinitis.   fluticasone furoate-vilanterol 200-25 MCG/INH Aepb Commonly known as: Breo Ellipta Inhale 1 puff into the lungs daily.   hydrochlorothiazide 12.5 MG capsule Commonly known as: MICROZIDE Take 1 capsule (12.5 mg total) by mouth daily.   montelukast 10 MG tablet Commonly known as: SINGULAIR TAKE 1 TABLET (10 MG TOTAL) BY MOUTH AT BEDTIME.   multivitamin with minerals Tabs tablet Take 1 tablet by mouth daily.   pantoprazole 40 MG tablet Commonly known as: PROTONIX TAKE 1 TABLET (40 MG TOTAL) BY MOUTH DAILY.   predniSONE 10 MG tablet Commonly known as: DELTASONE 40 mg x 2 days then 30 mg x 2 days then 20 mg x 2 days then 10mg  x 2 days then stop   triamcinolone 0.025 % ointment Commonly known as: KENALOG APPLY 1 APPLICATION TOPICALLY 2 TIMES DAILY. What changed: See the new instructions.   vitamin B-12 500 MCG tablet Commonly  known as: CYANOCOBALAMIN Take 1 tablet (500 mcg total) by mouth daily. Start taking on: February 10, 2019      Follow-up Information    Lanae Boast, FNP Follow up in 1 week(s).   Specialty: Family Medicine Why: needs referral to GI for colonoscopy Contact information: [place] [address] [phone]            Time coordinating discharge: 35 min  Signed:  Geradine Girt DO  [facility] 02/09/2019, 5:12 PM

## 2019-02-09 NOTE — Progress Notes (Signed)
Nsg Discharge Note  Admit Date:  02/07/2019 Discharge date: 02/09/2019   Crist Infante to be D/C'd Home per MD order.  AVS completed.  Copy for chart, and copy for patient signed, and dated. Patient/caregiver able to verbalize understanding.  Discharge Medication: Allergies as of 02/09/2019   No Known Allergies     Medication List    TAKE these medications   albuterol 108 (90 Base) MCG/ACT inhaler Commonly known as: Ventolin HFA INHALE 2 PUFFS INTO THE LUNGS EVERY 6 HOURS AS NEEDED FOR WHEEZING OR SHORTNESS OF BREATH What changed:   how much to take  how to take this  when to take this  reasons to take this  additional instructions  Another medication with the same name was removed. Continue taking this medication, and follow the directions you see here.   atorvastatin 80 MG tablet Commonly known as: LIPITOR Take 1 tablet (80 mg total) by mouth daily at 6 PM.   Chantix 1 MG tablet Generic drug: varenicline TAKE 1 TABLET (1 MG TOTAL) BY MOUTH 2 (TWO) TIMES DAILY. What changed: See the new instructions.   clopidogrel 75 MG tablet Commonly known as: PLAVIX TAKE 1 TABLET (75 MG TOTAL) BY MOUTH DAILY.   doxycycline 100 MG tablet Commonly known as: VIBRA-TABS Take 1 tablet (100 mg total) by mouth 2 (two) times daily.   ferrous fumarate 325 (106 Fe) MG Tabs tablet Commonly known as: HEMOCYTE - 106 mg FE Take 1 tablet (106 mg of iron total) by mouth daily.   fluticasone 50 MCG/ACT nasal spray Commonly known as: FLONASE Place 2 sprays into both nostrils as needed for allergies or rhinitis.   fluticasone furoate-vilanterol 200-25 MCG/INH Aepb Commonly known as: Breo Ellipta Inhale 1 puff into the lungs daily.   hydrochlorothiazide 12.5 MG capsule Commonly known as: MICROZIDE Take 1 capsule (12.5 mg total) by mouth daily.   montelukast 10 MG tablet Commonly known as: SINGULAIR TAKE 1 TABLET (10 MG TOTAL) BY MOUTH AT BEDTIME.   multivitamin with minerals Tabs  tablet Take 1 tablet by mouth daily.   pantoprazole 40 MG tablet Commonly known as: PROTONIX TAKE 1 TABLET (40 MG TOTAL) BY MOUTH DAILY.   predniSONE 10 MG tablet Commonly known as: DELTASONE 40 mg x 2 days then 30 mg x 2 days then 20 mg x 2 days then 10mg  x 2 days then stop   triamcinolone 0.025 % ointment Commonly known as: KENALOG APPLY 1 APPLICATION TOPICALLY 2 TIMES DAILY. What changed: See the new instructions.   vitamin B-12 500 MCG tablet Commonly known as: CYANOCOBALAMIN Take 1 tablet (500 mcg total) by mouth daily. Start taking on: February 10, 2019       Discharge Assessment: Vitals:   02/09/19 0829 02/09/19 1301  BP:  134/76  Pulse:  91  Resp:  18  Temp:  98.2 F (36.8 C)  SpO2: 99% 100%   Skin clean, dry and intact without evidence of skin break down, no evidence of skin tears noted. IV catheter discontinued intact. Site without signs and symptoms of complications - no redness or edema noted at insertion site, patient denies c/o pain - only slight tenderness at site.  Dressing with slight pressure applied.  D/c Instructions-Education: Discharge instructions given to patient/family with verbalized understanding. D/c education completed with patient/family including follow up instructions, medication list, d/c activities limitations if indicated, with other d/c instructions as indicated by MD - patient able to verbalize understanding, all questions fully answered. Patient instructed to return  to ED, call 911, or call MD for any changes in condition.  Patient escorted via [place], and D/C home via private auto.  Tresa Endo, RN 02/09/2019 4:13 PM

## 2019-02-11 MED FILL — ?PREDNISONE 10 MG TABLET: 10 | 8 days supply | Qty: 20 | Fill #0

## 2019-02-11 MED FILL — ?DOXYCYCLINE HYCL 100MG: 100 | 3 days supply | Qty: 6 | Fill #0

## 2019-02-13 ENCOUNTER — Ambulatory Visit: Payer: Medicare Other | Admitting: Pharmacist

## 2019-02-14 ENCOUNTER — Inpatient Hospital Stay: Payer: Medicare Other | Admitting: Family Medicine

## 2019-02-20 ENCOUNTER — Encounter (HOSPITAL_COMMUNITY): Payer: Self-pay

## 2019-02-20 ENCOUNTER — Encounter (HOSPITAL_COMMUNITY): Payer: Self-pay | Admitting: *Deleted

## 2019-02-21 ENCOUNTER — Encounter: Payer: Self-pay | Admitting: Family Medicine

## 2019-02-21 ENCOUNTER — Other Ambulatory Visit: Payer: Self-pay

## 2019-02-21 ENCOUNTER — Ambulatory Visit (INDEPENDENT_AMBULATORY_CARE_PROVIDER_SITE_OTHER): Payer: Medicare Other | Admitting: Family Medicine

## 2019-02-21 VITALS — BP 128/66 | HR 85 | Temp 98.9°F | Resp 18 | Ht 73.0 in | Wt 204.0 lb

## 2019-02-21 DIAGNOSIS — D649 Anemia, unspecified: Secondary | ICD-10-CM

## 2019-02-21 DIAGNOSIS — Z1211 Encounter for screening for malignant neoplasm of colon: Secondary | ICD-10-CM

## 2019-02-21 DIAGNOSIS — J449 Chronic obstructive pulmonary disease, unspecified: Secondary | ICD-10-CM

## 2019-02-21 MED ORDER — ALBUTEROL SULFATE (2.5 MG/3ML) 0.083% IN NEBU: 2.5000 mg | INHALATION_SOLUTION | Freq: Four times a day (QID) | RESPIRATORY_TRACT | 1 refills | Status: DC | PRN

## 2019-02-21 MED FILL — ALBUTEROL SUL 2.5 MG/3 ML S: (2.5 MG/3ML | 12 days supply | Qty: 150 | Fill #0

## 2019-02-21 NOTE — Progress Notes (Signed)
Patient [facility]   Progress Note: General Provider: Lanae Boast, FNP  SUBJECTIVE:   [name] is a 63 y.o. male who  has a past medical history of Anginal pain ([place]), CAD (coronary artery disease), Cocaine abuse ([place]), COPD (chronic obstructive pulmonary disease) ([place]), GERD (gastroesophageal reflux disease), H/O: eczema, H/O: GI bleed, High cholesterol, Hypertension, Myocardial infarction ([place]), Perforation of duodenal ulcer ([place]), Shortness of breath dyspnea, and Tobacco abuse.. Patient presents today for Hospitalization Follow-up (copd ) Patient states that he is feeling better, but continues to have mild SOB that is exacerbated by exertion. He is not followed by pulmonology and there are no recent PFTs. He reports compliance with his medications. He is requesting a nebulizer for home use. Continues to smoke and not currently interested in quitting. He states that hs has tried chantix in 2019. Also was found to be anemic during hospitalization. He was instructed to have a referral to GI for colonoscopy and egd for possible internal bleeding. He denies seeing blood in stools, dark stools or hemoptysis.   Review of Systems  Constitutional: Negative.   HENT: Negative.   Eyes: Negative.   Respiratory: Positive for sputum production, shortness of breath and wheezing. Negative for cough and hemoptysis.   Cardiovascular: Negative.   Gastrointestinal: Negative.   Genitourinary: Negative.   Musculoskeletal: Negative.   Skin: Negative.   Neurological: Negative.   Psychiatric/Behavioral: Negative.      OBJECTIVE: BP 128/66 (BP Location: Right Arm, Patient Position: Sitting, Cuff Size: Normal)   Pulse 85   Temp 98.9 F (37.2 C) (Oral)   Resp 18   Ht 6\' 1"  (1.854 m)   Wt 204 lb (92.5 kg)   SpO2 100%   BMI 26.91 kg/m   Wt Readings from Last 3 Encounters:  02/21/19 204 lb (92.5 kg)  02/08/19 195 lb 8.8 oz (88.7 kg)  11/07/18 187 lb  (84.8 kg)     Physical Exam Vitals signs and nursing note reviewed.  Constitutional:      General: He is not in acute distress.    Appearance: Normal appearance.  HENT:     Head: Normocephalic and atraumatic.  Eyes:     Extraocular Movements: Extraocular movements intact.     Conjunctiva/sclera: Conjunctivae normal.     Pupils: Pupils are equal, round, and reactive to light.  Cardiovascular:     Rate and Rhythm: Normal rate and regular rhythm.     Heart sounds: No murmur.  Pulmonary:     Effort: Pulmonary effort is normal.     Breath sounds: Normal breath sounds.  Musculoskeletal: Normal range of motion.  Skin:    General: Skin is warm and dry.  Neurological:     Mental Status: He is alert and oriented to person, place, and time.  Psychiatric:        Mood and Affect: Mood normal.        Behavior: Behavior normal.        Thought Content: Thought content normal.        Judgment: Judgment normal.     ASSESSMENT/PLAN:  1. Chronic obstructive pulmonary disease, unspecified COPD type ([place]) Will order nebulizer for home use to aid in keeping patient from using the ED during mild exaccerbations.  - CBC with Differential - Comprehensive metabolic panel - Ambulatory referral to Pulmonology - For home use only DME Nebulizer machine - albuterol (PROVENTIL) (2.5 MG/3ML) 0.083% nebulizer solution; Take 3 mLs (2.5 mg total) by nebulization  every 6 (six) hours as needed for wheezing or shortness of breath.  Dispense: 150 mL; Refill: 1  2. Anemia, unspecified type - Ambulatory referral to Gastroenterology  3. Screen for colon cancer - Ambulatory referral to Gastroenterology    Return in about 4 weeks (around 03/21/2019) for COPD and anemia.    The patient was given clear instructions to go to ER or return to medical center if symptoms do not improve, worsen or new problems develop. The patient verbalized understanding and agreed with plan of care.   Ms. Doug Sou. Nathaneil Canary, FNP-BC  [facility] [address] [place], [place] [address] [phone]

## 2019-02-21 NOTE — Patient Instructions (Signed)
COPD and Physical Activity °Chronic obstructive pulmonary disease (COPD) is a long-term (chronic) condition that affects the lungs. COPD is a general term that can be used to describe many different lung problems that cause lung swelling (inflammation) and limit airflow, including chronic bronchitis and emphysema. °The main symptom of COPD is shortness of breath, which makes it harder to do even simple tasks. This can also make it harder to exercise and be active. Talk with your health care provider about treatments to help you breathe better and actions you can take to prevent breathing problems during physical activity. °What are the benefits of exercising with COPD? °Exercising regularly is an important part of a healthy lifestyle. You can still exercise and do physical activities even though you have COPD. Exercise and physical activity improve your shortness of breath by increasing blood flow (circulation). This causes your heart to pump more oxygen through your body. Moderate exercise can improve your: °· Oxygen use. °· Energy level. °· Shortness of breath. °· Strength in your breathing muscles. °· Heart health. °· Sleep. °· Self-esteem and feelings of self-worth. °· Depression, stress, and anxiety levels. °Exercise can benefit everyone with COPD. The severity of your disease may affect how hard you can exercise, especially at first, but everyone can benefit. Talk with your health care provider about how much exercise is safe for you, and which activities and exercises are safe for you. °What actions can I take to prevent breathing problems during physical activity? °· Sign up for a pulmonary rehabilitation program. This type of program may include: °? Education about lung diseases. °? Exercise classes that teach you how to exercise and be more active while improving your breathing. This usually involves: °§ Exercise using your lower extremities, such as a stationary bicycle. °§ About 30 minutes of exercise, 2  to 5 times per week, for 6 to 12 weeks °§ Strength training, such as push ups or leg lifts. °? Nutrition education. °? Group classes in which you can talk with others who also have COPD and learn ways to manage stress. °· If you use an oxygen tank, you should use it while you exercise. Work with your health care provider to adjust your oxygen for your physical activity. Your resting flow rate is different from your flow rate during physical activity. °· While you are exercising: °? Take slow breaths. °? Pace yourself and do not try to go too fast. °? Purse your lips while breathing out. Pursing your lips is similar to a kissing or whistling position. °? If doing exercise that uses a quick burst of effort, such as weight lifting: °§ Breathe in before starting the exercise. °§ Breathe out during the hardest part of the exercise (such as raising the weights). °Where to find support °You can find support for exercising with COPD from: °· Your health care provider. °· A pulmonary rehabilitation program. °· Your local health department or community health programs. °· Support groups, online or in-person. Your health care provider may be able to recommend support groups. °Where to find more information °You can find more information about exercising with COPD from: °· American Lung Association: lung.org. °· COPD Foundation: copdfoundation.org. °Contact a health care provider if: °· Your symptoms get worse. °· You have chest pain. °· You have nausea. °· You have a fever. °· You have trouble talking or catching your breath. °· You want to start a new exercise program or a new activity. °Summary °· COPD is a general term that can   be used to describe many different lung problems that cause lung swelling (inflammation) and limit airflow. This includes chronic bronchitis and emphysema. °· Exercise and physical activity improve your shortness of breath by increasing blood flow (circulation). This causes your heart to provide more  oxygen to your body. °· Contact your health care provider before starting any exercise program or new activity. Ask your health care provider what exercises and activities are safe for you. °This information is not intended to replace advice given to you by your health care provider. Make sure you discuss any questions you have with your health care provider. °Document Released: 06/22/2017 Document Revised: 09/19/2018 Document Reviewed: 06/22/2017 °Elsevier Patient Education © 2020 [employer]. ° °

## 2019-02-22 LAB — CBC WITH DIFFERENTIAL/PLATELET
Basophils Absolute: 0 10*3/uL (ref 0.0–0.2)
Basos: 1 %
EOS (ABSOLUTE): 0.4 10*3/uL (ref 0.0–0.4)
Eos: 5 %
Hematocrit: 34.1 % — ABNORMAL LOW (ref 37.5–51.0)
Hemoglobin: 10 g/dL — ABNORMAL LOW (ref 13.0–17.7)
Immature Grans (Abs): 0.1 10*3/uL (ref 0.0–0.1)
Immature Granulocytes: 1 %
Lymphocytes Absolute: 1.7 10*3/uL (ref 0.7–3.1)
Lymphs: 21 %
MCH: 20.9 pg — ABNORMAL LOW (ref 26.6–33.0)
MCHC: 29.3 g/dL — ABNORMAL LOW (ref 31.5–35.7)
MCV: 71 fL — ABNORMAL LOW (ref 79–97)
Monocytes Absolute: 0.7 10*3/uL (ref 0.1–0.9)
Monocytes: 9 %
Neutrophils Absolute: 5 10*3/uL (ref 1.4–7.0)
Neutrophils: 63 %
Platelets: 306 10*3/uL (ref 150–450)
RBC: 4.78 x10E6/uL (ref 4.14–5.80)
RDW: 26 % — ABNORMAL HIGH (ref 11.6–15.4)
WBC: 7.7 10*3/uL (ref 3.4–10.8)

## 2019-02-22 LAB — COMPREHENSIVE METABOLIC PANEL
ALT: 22 IU/L (ref 0–44)
AST: 18 IU/L (ref 0–40)
Albumin/Globulin Ratio: 1.7 (ref 1.2–2.2)
Albumin: 3.8 g/dL (ref 3.8–4.8)
Alkaline Phosphatase: 121 IU/L — ABNORMAL HIGH (ref 39–117)
BUN/Creatinine Ratio: 8 — ABNORMAL LOW (ref 10–24)
BUN: 10 mg/dL (ref 8–27)
Bilirubin Total: 0.5 mg/dL (ref 0.0–1.2)
CO2: 25 mmol/L (ref 20–29)
Calcium: 9.1 mg/dL (ref 8.6–10.2)
Chloride: 106 mmol/L (ref 96–106)
Creatinine, Ser: 1.2 mg/dL (ref 0.76–1.27)
GFR calc Af Amer: 73 mL/min/{1.73_m2} (ref >59)
GFR calc non Af Amer: 63 mL/min/{1.73_m2} (ref >59)
Globulin, Total: 2.3 g/dL (ref 1.5–4.5)
Glucose: 98 mg/dL (ref 65–99)
Potassium: 3.7 mmol/L (ref 3.5–5.2)
Sodium: 144 mmol/L (ref 134–144)
Total Protein: 6.1 g/dL (ref 6.0–8.5)

## 2019-02-25 ENCOUNTER — Telehealth: Payer: Self-pay

## 2019-02-25 NOTE — Progress Notes (Signed)
Your labs are stable. Continue with your current medications. Please remember to keep your follow up appointment. If you have problems, questions or concerns, please make an appointment to discuss. Thanks!

## 2019-02-25 NOTE — Telephone Encounter (Signed)
Called, no answer. Left  A message that labs are normal and asked to call back if any questions. Thanks!

## 2019-02-25 NOTE — Telephone Encounter (Signed)
-----   Message from Lanae Boast, [place] sent at 02/25/2019  3:04 PM EDT ----- Your labs are stable. Continue with your current medications. Please remember to keep your follow up appointment. If you have problems, questions or concerns, please make an appointment to discuss. Thanks!

## 2019-03-14 ENCOUNTER — Other Ambulatory Visit: Payer: Self-pay | Admitting: Family Medicine

## 2019-03-14 DIAGNOSIS — Z720 Tobacco use: Secondary | ICD-10-CM

## 2019-03-14 DIAGNOSIS — J209 Acute bronchitis, unspecified: Secondary | ICD-10-CM

## 2019-03-14 DIAGNOSIS — J449 Chronic obstructive pulmonary disease, unspecified: Secondary | ICD-10-CM

## 2019-03-14 MED FILL — CLOPIDOGREL 75 MG TABLET: 75 | 30 days supply | Qty: 30 | Fill #1

## 2019-03-14 MED FILL — MONTELUKAST SOD 10 MG TAB: 10 | 30 days supply | Qty: 30 | Fill #2

## 2019-03-14 MED FILL — PANTOPRAZOLE SOD DR 40 MG T: 40 | 30 days supply | Qty: 30 | Fill #1

## 2019-03-14 MED FILL — ATORVASTATIN 80 MG TABLET: 80 | 30 days supply | Qty: 30 | Fill #3

## 2019-03-14 MED FILL — ?HYDROCHLOROTHIAZIDE 12.5M: 12.5 | 30 days supply | Qty: 30 | Fill #5

## 2019-03-15 MED FILL — !CHANTIX CONT MONTH BOX: 1 | 22 days supply | Qty: 56 | Fill #0

## 2019-03-15 MED FILL — $VENTOLIN HFA 18G INHALER: 108 (90 BAS | 25 days supply | Qty: 18 | Fill #0

## 2019-03-21 ENCOUNTER — Ambulatory Visit: Payer: Medicare Other | Admitting: Family Medicine

## 2019-03-25 ENCOUNTER — Encounter: Payer: Self-pay | Admitting: Family Medicine

## 2019-03-26 ENCOUNTER — Encounter: Payer: Self-pay | Admitting: Emergency Medicine

## 2019-03-26 ENCOUNTER — Other Ambulatory Visit: Payer: Self-pay

## 2019-03-26 ENCOUNTER — Ambulatory Visit (INDEPENDENT_AMBULATORY_CARE_PROVIDER_SITE_OTHER): Payer: Medicare Other | Admitting: Emergency Medicine

## 2019-03-26 VITALS — BP 142/84 | HR 82 | Ht 73.0 in | Wt 206.0 lb

## 2019-03-26 DIAGNOSIS — F17210 Nicotine dependence, cigarettes, uncomplicated: Secondary | ICD-10-CM | POA: Diagnosis not present

## 2019-03-26 DIAGNOSIS — Z720 Tobacco use: Secondary | ICD-10-CM

## 2019-03-26 DIAGNOSIS — J438 Other emphysema: Secondary | ICD-10-CM

## 2019-03-26 DIAGNOSIS — R0600 Dyspnea, unspecified: Secondary | ICD-10-CM

## 2019-03-26 DIAGNOSIS — F17200 Nicotine dependence, unspecified, uncomplicated: Secondary | ICD-10-CM

## 2019-03-26 MED ORDER — STIOLTO RESPIMAT 2.5-2.5 MCG/ACT IN AERS: 2.0000 | INHALATION_SPRAY | Freq: Every day | RESPIRATORY_TRACT | 0 refills | Status: DC

## 2019-03-26 NOTE — Patient Instructions (Signed)
Please start Stiolto 2 puffs once a day. This will take place of Breo. Keep Albuterol available to use if needed. Continue working on decreasing your cigarettes and taking Chantix. Follow up with Dr. Lamonte Sakai in 1 month with full pulmonary function test on the same day.

## 2019-03-26 NOTE — Progress Notes (Signed)
Patient seen in the office today and instructed on use of Stiolto Respimat.  Patient expressed understanding and demonstrated technique.  

## 2019-03-26 NOTE — Assessment & Plan Note (Signed)
He has decreased his cigarettes to about 3/day, is using Chantix.  He is motivated to quit.  We talked today about strategies to help with this.  He is going to try to set a quit date.  Offered our support.

## 2019-03-26 NOTE — Progress Notes (Signed)
Subjective:    Patient ID: [name], male    DOB: Aug 19, 1955, 63 y.o.   MRN: [id number]  HPI 63 year old active smoker (40 pack years) with a history of cocaine abuse, coronary disease/CABG (2015), hypertension, peptic ulcer disease and prior GI bleed, GERD, allergic rhinitis.  He carries a history of COPD that was diagnosed by PFT in 2015 before his CABG, I reviewed, shows severe obstruction without a bronchodilator response, hyperinflated volumes and an intact diffusion capacity. Currently uses Breo, has albuterol HFA and nebs that he uses approximately.  He is on fluticasone nasal spray as needed (allergy season), Singulair, Protonix once daily  He has been having progressive exertional dyspnea, some increased cough.  He was hospitalized 8/27 with chest discomfort and dyspnea that awakened him from sleep.  That was after smoking cocaine, from which he had been abstinent for many years prior.  He was treated for an acute exacerbation of COPD. He improved with steroids. He feels back to baseline - but this is characterized by exertional SOB, has slowly progressed over several years. Especially bothersome in the am when he wakes up. He has cough  - productive of clear mucous, can happen all day long. He is on mucinex. Uses albuterol 3-4x a day, benefits from this. He has cut his cigarettes down to 3 a day, is on chantix. His Prevnar is up to date, flu shot up to date.    Review of Systems  Constitutional: Negative for fever and unexpected weight change.  HENT: Negative for congestion, dental problem, ear pain, nosebleeds, postnasal drip, rhinorrhea, sinus pressure, sneezing, sore throat and trouble swallowing.   Eyes: Negative for redness and itching.  Respiratory: Positive for cough and shortness of breath. Negative for chest tightness and wheezing.   Cardiovascular: Negative for palpitations and leg swelling.  Gastrointestinal: Negative for nausea and vomiting.  Genitourinary: Negative  for dysuria.  Musculoskeletal: Negative for joint swelling.  Skin: Negative for rash.  Neurological: Negative for headaches.  Hematological: Does not bruise/bleed easily.  Psychiatric/Behavioral: Negative for dysphoric mood. The patient is not nervous/anxious.    Past Medical History:  Diagnosis Date  . Anginal pain ([place])   . CAD (coronary artery disease)    4V CABG 10/31/13  . Cocaine abuse ([place])   . COPD (chronic obstructive pulmonary disease) ([place])   . GERD (gastroesophageal reflux disease)   . H/O: eczema    lower extremities  . H/O: GI bleed    a. 63 y/o ago, r/t ulcer. Denies recent GIB.  Marland Kitchen High cholesterol   . Hypertension   . Myocardial infarction ([place])   . Perforation of duodenal ulcer ([place])    a. 1970s - s/p surgery.   . Shortness of breath dyspnea   . Tobacco abuse      Family History  Problem Relation Age of Onset  . Lung cancer Mother   . Alzheimer's disease Mother   . Lung cancer Father   . HIV/AIDS Brother   . Obesity Brother   . Lung cancer Other      Social History   Socioeconomic History  . Marital status: Single    Spouse name: Not on file  . Number of children: 1  . Years of education: Not on file  . Highest education level: Bachelor's degree (e.g., BA, AB, BS)  Occupational History  . Occupation: retired  Scientific laboratory technician  . Financial resource strain: Not on file  . Food insecurity    Worry: Not on file  Inability: Not on file  . Transportation needs    Medical: Not on file    Non-medical: Not on file  Tobacco Use  . Smoking status: Current Every Day Smoker    Packs/day: 0.50    Years: 40.00    Pack years: 20.00    Types: Cigarettes  . Smokeless tobacco: Never Used  . Tobacco comment: starting Chantix 01/04/18  Substance and Sexual Activity  . Alcohol use: Yes    Alcohol/week: 4.0 standard drinks    Types: 4 Standard drinks or equivalent per week    Comment: vodka  . Drug use: Yes    Types: Marijuana, Cocaine    Comment: "about 1  marijuana cigarette a week"  . Sexual activity: Yes    Birth control/protection: None  Lifestyle  . Physical activity    Days per week: Not on file    Minutes per session: Not on file  . Stress: Not on file  Relationships  . Social Herbalist on phone: Not on file    Gets together: Not on file    Attends religious service: Not on file    Active member of club or organization: Not on file    Attends meetings of clubs or organizations: Not on file    Relationship status: Not on file  . Intimate partner violence    Fear of current or ex partner: Not on file    Emotionally abused: Not on file    Physically abused: Not on file    Forced sexual activity: Not on file  Other Topics Concern  . Not on file  Social History Narrative   Pt is right-handed. He lives alone in a one story house. He drinks 2 cups coffee a day, and a liter of ginger-ale. He walks 4 x a week.    Has worked for phone company Lived in [place] and [place] No inhaled exposures No military   No Known Allergies   Outpatient Medications Prior to Visit  Medication Sig Dispense Refill  . albuterol (PROVENTIL) (2.5 MG/3ML) 0.083% nebulizer solution Take 3 mLs (2.5 mg total) by nebulization every 6 (six) hours as needed for wheezing or shortness of breath. 150 mL 1  . albuterol (VENTOLIN HFA) 108 (90 Base) MCG/ACT inhaler INHALE 2 PUFFS INTO THE LUNGS EVERY 6 HOURS AS NEEDED FOR WHEEZING OR SHORTNESS OF BREATH 18 g 0  . atorvastatin (LIPITOR) 80 MG tablet Take 1 tablet (80 mg total) by mouth daily at 6 PM. 30 tablet 11  . CHANTIX 1 MG tablet TAKE 1 TABLET (1 MG TOTAL) BY MOUTH 2 (TWO) TIMES DAILY. 56 tablet 0  . clopidogrel (PLAVIX) 75 MG tablet TAKE 1 TABLET (75 MG TOTAL) BY MOUTH DAILY. 30 tablet 1  . ferrous fumarate (HEMOCYTE - 106 MG FE) 325 (106 Fe) MG TABS tablet Take 1 tablet (106 mg of iron total) by mouth daily. 30 tablet 0  . fluticasone (FLONASE) 50 MCG/ACT nasal spray Place 2 sprays into both nostrils as  needed for allergies or rhinitis.    . fluticasone furoate-vilanterol (BREO ELLIPTA) 200-25 MCG/INH AEPB Inhale 1 puff into the lungs daily. 180 each 3  . hydrochlorothiazide (MICROZIDE) 12.5 MG capsule Take 1 capsule (12.5 mg total) by mouth daily. 90 capsule 1  . montelukast (SINGULAIR) 10 MG tablet TAKE 1 TABLET (10 MG TOTAL) BY MOUTH AT BEDTIME. 30 tablet 3  . Multiple Vitamin (MULTIVITAMIN WITH MINERALS) TABS tablet Take 1 tablet by mouth daily. 30 tablet 1  .  pantoprazole (PROTONIX) 40 MG tablet TAKE 1 TABLET (40 MG TOTAL) BY MOUTH DAILY. 30 tablet 1  . triamcinolone (KENALOG) 0.025 % ointment APPLY 1 APPLICATION TOPICALLY 2 TIMES DAILY. 30 g 2  . vitamin B-12 (CYANOCOBALAMIN) 500 MCG tablet Take 1 tablet (500 mcg total) by mouth daily. 30 tablet 0   No facility-administered medications prior to visit.         Objective:   Physical Exam Vitals:   03/26/19 1041  BP: (!) 142/84  Pulse: 82  SpO2: 97%  Weight: 206 lb (93.4 kg)  Height: 6\' 1"  (1.854 m)   Gen: Pleasant, well-nourished, in no distress,  normal affect  ENT: No lesions,  mouth clear,  oropharynx clear, no postnasal drip  Neck: No JVD, no stridor  Lungs: No use of accessory muscles, distant, no wheeze on normal breath or forced exp  Cardiovascular: RRR, heart sounds normal, no murmur or gallops, no peripheral edema  Musculoskeletal: No deformities, no cyanosis or clubbing  Neuro: alert, awake, non focal  Skin: Warm, no lesions or rash      Assessment & Plan:  Chronic obstructive pulmonary disease ([place]) COPD based on clinical history, prior PFT.  He was recently admitted with an acute exacerbation that was probably caused by cocaine use.  He is now abstinent.  Back to his usual baseline but he does still have daily symptoms.  He does not flare frequently and I think we can try changing his Breo to [employer] (get rid of the inhaled steroid for now) to see if he gets more benefit.  Please start Stiolto 2 puffs  once a day. This will take place of Breo. Keep Albuterol available to use if needed. Follow up with Dr. Lamonte Sakai in 1 month with full pulmonary function test on the same day.  Tobacco dependence He has decreased his cigarettes to about 3/day, is using Chantix.  He is motivated to quit.  We talked today about strategies to help with this.  He is going to try to set a quit date.  Offered our support.  Baltazar Apo, MD, PhD 03/26/2019, 1:29 PM [facility] [phone] or if no answer [phone]

## 2019-03-26 NOTE — Assessment & Plan Note (Signed)
COPD based on clinical history, prior PFT.  He was recently admitted with an acute exacerbation that was probably caused by cocaine use.  He is now abstinent.  Back to his usual baseline but he does still have daily symptoms.  He does not flare frequently and I think we can try changing his Breo to [employer] (get rid of the inhaled steroid for now) to see if he gets more benefit.  Please start Stiolto 2 puffs once a day. This will take place of Breo. Keep Albuterol available to use if needed. Follow up with Dr. Lamonte Sakai in 1 month with full pulmonary function test on the same day.

## 2019-04-15 MED FILL — ATORVASTATIN 80 MG TABLET: 80 | 30 days supply | Qty: 30 | Fill #4

## 2019-04-15 MED FILL — MONTELUKAST SOD 10 MG TAB: 10 | 30 days supply | Qty: 30 | Fill #3

## 2019-04-16 ENCOUNTER — Other Ambulatory Visit: Payer: Self-pay

## 2019-04-16 DIAGNOSIS — I214 Non-ST elevation (NSTEMI) myocardial infarction: Secondary | ICD-10-CM

## 2019-04-16 DIAGNOSIS — Z720 Tobacco use: Secondary | ICD-10-CM

## 2019-04-16 DIAGNOSIS — J440 Chronic obstructive pulmonary disease with acute lower respiratory infection: Secondary | ICD-10-CM

## 2019-04-16 DIAGNOSIS — J209 Acute bronchitis, unspecified: Secondary | ICD-10-CM

## 2019-04-16 DIAGNOSIS — I10 Essential (primary) hypertension: Secondary | ICD-10-CM

## 2019-04-16 DIAGNOSIS — J449 Chronic obstructive pulmonary disease, unspecified: Secondary | ICD-10-CM

## 2019-04-16 MED ORDER — ALBUTEROL SULFATE HFA 108 (90 BASE) MCG/ACT IN AERS: INHALATION_SPRAY | RESPIRATORY_TRACT | 0 refills | Status: DC

## 2019-04-16 MED ORDER — HYDROCHLOROTHIAZIDE 12.5 MG PO CAPS: 12.5000 mg | ORAL_CAPSULE | Freq: Every day | ORAL | 1 refills | Status: DC

## 2019-04-16 MED ORDER — PANTOPRAZOLE SODIUM 40 MG PO TBEC: 40.0000 mg | DELAYED_RELEASE_TABLET | Freq: Every day | ORAL | 1 refills | Status: DC

## 2019-04-16 MED ORDER — VARENICLINE TARTRATE 1 MG PO TABS: ORAL_TABLET | ORAL | 0 refills | Status: DC

## 2019-04-16 MED ORDER — CLOPIDOGREL BISULFATE 75 MG PO TABS: 75.0000 mg | ORAL_TABLET | Freq: Every day | ORAL | 1 refills | Status: DC

## 2019-04-16 MED FILL — CLOPIDOGREL 75 MG TABLET: 75 | 30 days supply | Qty: 30 | Fill #0

## 2019-04-16 MED FILL — $VENTOLIN HFA 18G INHALER: 108 (90 BAS | 25 days supply | Qty: 18 | Fill #0

## 2019-04-16 MED FILL — !CHANTIX 1 MG TABLET: 1 | 28 days supply | Qty: 56 | Fill #0

## 2019-04-16 MED FILL — PANTOPRAZOLE SOD DR 40 MG T: 40 | 30 days supply | Qty: 30 | Fill #0

## 2019-04-16 MED FILL — ?HYDROCHLOROTHIAZIDE 12.5M: 12.5 | 30 days supply | Qty: 30 | Fill #0

## 2019-04-19 ENCOUNTER — Encounter: Payer: Self-pay | Admitting: Nurse Practitioner

## 2019-04-29 ENCOUNTER — Telehealth: Payer: Self-pay | Admitting: Emergency Medicine

## 2019-04-29 MED ORDER — STIOLTO RESPIMAT 2.5-2.5 MCG/ACT IN AERS: 2.0000 | INHALATION_SPRAY | Freq: Every day | RESPIRATORY_TRACT | 5 refills | Status: DC

## 2019-04-29 MED FILL — !BREO ELLIPTA 200-25 MCG: 200-25 | 30 days supply | Qty: 60 | Fill #3

## 2019-04-29 NOTE — Telephone Encounter (Signed)
Refill for stiolto sent to pt's preferred pharmacy. Attempted to call pt but unable to reach. Per pt's dpr, it is okay to leave a detailed message on pt's phone. Detailed message left making pt aware this had been done. Nothing further needed.

## 2019-04-30 ENCOUNTER — Ambulatory Visit: Payer: Medicare Other | Admitting: Family Medicine

## 2019-04-30 ENCOUNTER — Encounter: Payer: Self-pay | Admitting: Nurse Practitioner

## 2019-04-30 ENCOUNTER — Other Ambulatory Visit (INDEPENDENT_AMBULATORY_CARE_PROVIDER_SITE_OTHER): Payer: Medicare Other

## 2019-04-30 ENCOUNTER — Ambulatory Visit (INDEPENDENT_AMBULATORY_CARE_PROVIDER_SITE_OTHER): Payer: Medicare Other | Admitting: Nurse Practitioner

## 2019-04-30 VITALS — BP 140/86 | HR 102 | Temp 98.7°F | Ht 73.0 in | Wt 207.0 lb

## 2019-04-30 DIAGNOSIS — Z8719 Personal history of other diseases of the digestive system: Secondary | ICD-10-CM

## 2019-04-30 DIAGNOSIS — K631 Perforation of intestine (nontraumatic): Secondary | ICD-10-CM

## 2019-04-30 DIAGNOSIS — E43 Unspecified severe protein-calorie malnutrition: Secondary | ICD-10-CM

## 2019-04-30 DIAGNOSIS — D509 Iron deficiency anemia, unspecified: Secondary | ICD-10-CM

## 2019-04-30 LAB — CBC WITH DIFFERENTIAL/PLATELET
Basophils Absolute: 0.2 10*3/uL — ABNORMAL HIGH (ref 0.0–0.1)
Basophils Relative: 2.2 % (ref 0.0–3.0)
Eosinophils Absolute: 0.9 10*3/uL — ABNORMAL HIGH (ref 0.0–0.7)
Eosinophils Relative: 12.7 % — ABNORMAL HIGH (ref 0.0–5.0)
HCT: 43.4 % (ref 39.0–52.0)
Hemoglobin: 14.2 g/dL (ref 13.0–17.0)
Lymphocytes Relative: 31.8 % (ref 12.0–46.0)
Lymphs Abs: 2.2 10*3/uL (ref 0.7–4.0)
MCHC: 32.7 g/dL (ref 30.0–36.0)
MCV: 82.6 fl (ref 78.0–100.0)
Monocytes Absolute: 0.8 10*3/uL (ref 0.1–1.0)
Monocytes Relative: 12.3 % — ABNORMAL HIGH (ref 3.0–12.0)
Neutro Abs: 2.8 10*3/uL (ref 1.4–7.7)
Neutrophils Relative %: 41 % — ABNORMAL LOW (ref 43.0–77.0)
Platelets: 294 10*3/uL (ref 150.0–400.0)
RBC: 5.25 Mil/uL (ref 4.22–5.81)
RDW: 28.5 % — ABNORMAL HIGH (ref 11.5–15.5)
WBC: 6.8 10*3/uL (ref 4.0–10.5)

## 2019-04-30 LAB — BASIC METABOLIC PANEL
BUN: 9 mg/dL (ref 6–23)
CO2: 29 mEq/L (ref 19–32)
Calcium: 9.6 mg/dL (ref 8.4–10.5)
Chloride: 100 mEq/L (ref 96–112)
Creatinine, Ser: 1.08 mg/dL (ref 0.40–1.50)
GFR: 82.9 mL/min (ref >60.00)
Glucose, Bld: 92 mg/dL (ref 70–99)
Potassium: 4 mEq/L (ref 3.5–5.1)
Sodium: 138 mEq/L (ref 135–145)

## 2019-04-30 LAB — IBC + FERRITIN
Ferritin: 21.6 ng/mL — ABNORMAL LOW (ref 22.0–322.0)
Iron: 103 ug/dL (ref 42–165)
Saturation Ratios: 24.1 % (ref 20.0–50.0)
Transferrin: 305 mg/dL (ref 212.0–360.0)

## 2019-04-30 LAB — VITAMIN B12: Vitamin B-12: 655 pg/mL (ref 211–911)

## 2019-04-30 LAB — FOLATE: Folate: 22.1 ng/mL (ref >5.9)

## 2019-04-30 NOTE — Patient Instructions (Addendum)
If you are age 65 or older, your body mass index should be between 23-30. Your Body mass index is 27.31 kg/m. If this is out of the aforementioned range listed, please consider follow up with your Primary Care Provider.  If you are age 32 or younger, your body mass index should be between 19-25. Your Body mass index is 27.31 kg/m. If this is out of the aformentioned range listed, please consider follow up with your Primary Care Provider.   We will call you with a schedule for Dr Ardis Hughs to schedule a colonoscopy.   Your provider has requested that you go to the basement level for lab work before leaving today. Press "B" on the elevator. The lab is located at the first door on the left as you exit the elevator.  It was a pleasure to see you today!

## 2019-04-30 NOTE — Progress Notes (Signed)
04/30/2019 [name] [id number] 19-Oct-1955   HISTORY OF PRESENT ILLNESS: [name] is a 63 year old male with a past medical history significant for hypertension, CAD, MI 2015, 4 vessels CABG 2015, prolonged QT interval, CVA with loss of vision to his left eye 10/2017 on Plavix, COPD, cocaine use, GERD, perforated duodenal ulcer in the 1970's which required surgery, multiple upper GI bleeds 1980-1990's with his most recent UGI bleed 10/2017 which occurred while at [facility] hospital when admitted with a CVA. An MRI showed multifocal watershed infarcts to the left hemisphere and high grade stenosis of the left CCA. S/P left endarterectomy 5/27. He was placed on Plavix. ASA was discontinued and he was placed on Atorvastatin. An EGD was done 11/06/2018 which showed a large amount of food residue in the stomach which obscured the view of the proximal fundus, peptic injury to the duodenum without evidence of H. Pylori. Protonix 92m po QD ws prescribed and he was discharged on 5/29. He reported having 4 to 5 UGI bleeds from the 1980's until 1993. He felt fairly well until the end of August. [facility] exacerbation after he ran out of his inhalers for few days in the setting of active cocaine use. EMS found the patient to be in acute respiratory distress with 02 saturation 70%. His troponin levels were elevated related to demand ischemia. ECHO LV EF 60-65%. Laboratory studies showed iron deficiency anemia. Admission Hg 9.7. He received IV iron x 1 dose. His respiratory symptoms improved after receiving IV Solumedrol and nebulizer tx. He was discharged home 8/28 on a Prednisone taper. Currently, he denies having any dysphagia, heartburn or stomach pain.  No rectal bleeding or melena. His stools are darker brown since taking Ferrous Sulfate 3276mpo QD. He's never had a screening colonoscopy. No family history of colon cancer. No further cocaine use. He reported seeing his  cardiologist less than 1 year ago and exam was stable. He denies having any chest pain or palpitations. He is using his Stiolto and Albuterol inhalers as prescribed. He denies any current cough or SOB. He is scheduled to have PFT done in the next few weeks.   Labs 02/21/2019: WBC 7.7. Hg 10.0. HCT 34.1. MCV 71. PLT 306. Glu 98. BUN 10.Cr. 1.20 (0.76-1.27). T. Bili 0.5. Alk phos 121. AST 18. /ALT 22.    [facility]2017 [facility]/coffee gound emesis and melena: - Z-line regular, 40 cm from the incisors. - Post-surgical deformity in the prepyloric region of the stomach. - Congested mucosa in the prepyloric region of the stomach. Biopsied. - A large amount of food (residue) in the stomach. - Normal examined duodenum. - Esophageal plaques were found, consistent with candidiasis.   Stomach, biopsy, Pyloric channel - DUODENAL MUCOSA WITH CHRONIC ACTIVE INFLAMMATION CONSISTENT WITH PEPTIC INJURY. - Hinton DyerTAIN NEGATIVE FOR HELICOBACTER PYLORI. - NO DYSPLASIA OR MALIGNANCY  Past Medical History:  Diagnosis Date   Anginal pain ([facility]   CAD (coronary artery disease)    4V CABG 10/31/13   Cocaine [facility] ([facility]   COPD (chronic obstructive pulmonary disease) ([facility])    GERD (gastroesophageal reflux disease)    H/O: eczema    lower extremities   H/O: GI bleed    a. 5 30/o ago, r/t ulcer. Denies recent GIB.   High cholesterol    Hypertension    [facility]   Perforation of  duodenal ulcer ([place])    a. 1970s - s/p surgery.    Shortness of breath dyspnea    Tobacco abuse    Past Surgical History:  Procedure Laterality Date   BIOPSY  11/05/2017   Procedure: BIOPSY;  Surgeon: Wilford Corner, MD;  Location: [place];  Service: Endoscopy;;   CORONARY ARTERY BYPASS GRAFT N/A 10/31/2013   Procedure: CORONARY ARTERY BYPASS GRAFTING TIMES FOUR ON PUMP USING LEFT INTERNAL MAMMARY ARTERY AND RIGHT GREATER SAPHENOUS VEIN VIA ENDOVEIN  HARVEST.;  Surgeon: Melrose Nakayama, MD;  Location: [place];  Service: Open Heart Surgery;  Laterality: N/A;   ENDARTERECTOMY Left 11/06/2017   Procedure: ENDARTERECTOMY CAROTID;  Surgeon: Conrad Brushy, MD;  Location: [place];  Service: Vascular;  Laterality: Left;   ESOPHAGOGASTRODUODENOSCOPY (EGD) WITH PROPOFOL N/A 11/05/2017   Procedure: ESOPHAGOGASTRODUODENOSCOPY (EGD) WITH PROPOFOL;  Surgeon: Wilford Corner, MD;  Location: [place];  Service: Endoscopy;  Laterality: N/A;   INTRAOPERATIVE TRANSESOPHAGEAL ECHOCARDIOGRAM N/A 10/31/2013   Procedure: INTRAOPERATIVE TRANSESOPHAGEAL ECHOCARDIOGRAM;  Surgeon: Melrose Nakayama, MD;  Location: [place];  Service: Open Heart Surgery;  Laterality: N/A;   LEFT HEART CATHETERIZATION WITH CORONARY ANGIOGRAM N/A 10/28/2013   Procedure: LEFT HEART CATHETERIZATION WITH CORONARY ANGIOGRAM;  Surgeon: Burnell Blanks, MD;  Location: [facility] CATH LAB;  Service: Cardiovascular;  Laterality: N/A;   stomach sx      reports that he has been smoking cigarettes. He has a 20.00 pack-year smoking history. He has never used smokeless tobacco. He reports current alcohol use of about 4.0 standard drinks of alcohol per week. He reports current drug use. Drugs: Marijuana and Cocaine. family history includes Alzheimer's disease in his mother; HIV/AIDS in his brother; Lung cancer in his father, mother, and another family member; Obesity in his brother. No Known Allergies   Family History: Father died 73 due to  lung cancer. Mother died at the age of 71 with history of lung cancer and Alzheimer's disease. Brother severe obesity age 67. Sister 17 alive and well.  Social History: He  last used Cocaine 01/2019. He smokes marijuana every 2 to 3 days. He drinks 2 shots of Vodka twice weekly.   Outpatient Encounter Medications as of 04/30/2019  Medication Sig   albuterol (PROVENTIL) (2.5 MG/3ML) 0.083% nebulizer solution Take 3 mLs (2.5 mg total) by nebulization every 6  (six) hours as needed for wheezing or shortness of breath.   albuterol (VENTOLIN HFA) 108 (90 Base) MCG/ACT inhaler INHALE 2 PUFFS INTO THE LUNGS EVERY 6 HOURS AS NEEDED FOR WHEEZING OR SHORTNESS OF BREATH   atorvastatin (LIPITOR) 80 MG tablet Take 1 tablet (80 mg total) by mouth daily at 6 PM.   clopidogrel (PLAVIX) 75 MG tablet Take 1 tablet (75 mg total) by mouth daily.   ferrous fumarate (HEMOCYTE - 106 MG FE) 325 (106 Fe) MG TABS tablet Take 1 tablet (106 mg of iron total) by mouth daily.   fluticasone (FLONASE) 50 MCG/ACT nasal spray Place 2 sprays into both nostrils as needed for allergies or rhinitis.   fluticasone furoate-vilanterol (BREO ELLIPTA) 200-25 MCG/INH AEPB Inhale 1 puff into the lungs daily.   hydrochlorothiazide (MICROZIDE) 12.5 MG capsule Take 1 capsule (12.5 mg total) by mouth daily.   montelukast (SINGULAIR) 10 MG tablet TAKE 1 TABLET (10 MG TOTAL) BY MOUTH AT BEDTIME.   Multiple Vitamin (MULTIVITAMIN WITH MINERALS) TABS tablet Take 1 tablet by mouth daily.   pantoprazole (PROTONIX) 40 MG tablet Take 1 tablet (40 mg total) by mouth daily.  Tiotropium Bromide-Olodaterol (STIOLTO RESPIMAT) 2.5-2.5 MCG/ACT AERS Inhale 2 puffs into the lungs daily.   triamcinolone (KENALOG) 0.025 % ointment APPLY 1 APPLICATION TOPICALLY 2 TIMES DAILY.   varenicline (CHANTIX) 1 MG tablet TAKE 1 TABLET (1 MG TOTAL) BY MOUTH 2 (TWO) TIMES DAILY.   vitamin B-12 (CYANOCOBALAMIN) 500 MCG tablet Take 1 tablet (500 mcg total) by mouth daily.   No facility-administered encounter medications on file as of 04/30/2019.    REVIEW OF SYSTEMS  : All other systems reviewed and negative except where noted in the History of Present Illness.  PHYSICAL EXAM: BP 140/86 (BP Location: Left Arm, Patient Position: Sitting)    Pulse (!) 102    Temp 98.7 F (37.1 C)    Ht _0  (1.854 m)    Wt 207 lb (93.9 kg)    SpO2 97%    BMI 27.31 kg/m   General: Well developed 63 year old male in no acute  distress Head: Normocephalic and atraumatic Eyes:  sclerae anicteric,conjunctive pink. Ears: Normal auditory acuity Neck: Supple, no masses.  Lungs: Clear throughout to auscultation Heart: Regular rate and rhythm Abdomen: Soft, nontender, non distended. No masses or hepatomegaly noted. Normal bowel sounds x 4 quads. Large abdominal scar from sternum to lower abdomen.  Rectal: Deferred.  Musculoskeletal: Symmetrical with no gross deformities. Skin: No lesions on visible extremities Extremities: No edema  Neurological: Alert oriented x 4, grossly nonfocal Cervical Nodes:  No significant cervical adenopathy Psychological:  Alert and cooperative. Normal mood and affect  ASSESSMENT AND PLAN:  57.  63 year old male with a significant history of PUD, perforated ulcer age 49 with numerous upper GI bleeds presents for further evaluation regarding Iron deficiency anemia. No obvious sings of active GI bleeding. Hg 10. HCT 34.1 MCV 71 on 9/10.  -EGD and colonoscopy at [facility], benefits and risks discussed including risk with sedation, risk of bleeding, perforation and infection -Patient is aware if he actively uses cocaine prior to his procedure date his EGD and colonoscopy will be cancelled  2. GERD, asymptomatic at this time -Continue Protonix 85m once daily -EGD  3. CAD, MI, S/P CABG, prolonged QT QT interval, stable  4. CVA 10/2017 on Plavix s/p left carotid endarterectomy 11/07/2018 -our office will contact the patient's PCP or neurologist to verify Plavix instructions prior to proceeding with EGD and colonoscopy   5. COPD, currently stable    CC:  DLanae Boast FNP

## 2019-05-01 NOTE — Progress Notes (Signed)
I agree with the above note, plan. EGD/colonoscoy at [facility] in the next few weeks.  Thanks

## 2019-05-06 MED FILL — STIOLTO RESPIMAT INHAL SPRY: 2.5-2.5 | 28 days supply | Qty: 4 | Fill #0

## 2019-05-16 ENCOUNTER — Other Ambulatory Visit: Payer: Self-pay | Admitting: Family Medicine

## 2019-05-16 ENCOUNTER — Other Ambulatory Visit: Payer: Self-pay | Admitting: Internal Medicine

## 2019-05-16 DIAGNOSIS — J449 Chronic obstructive pulmonary disease, unspecified: Secondary | ICD-10-CM

## 2019-05-16 DIAGNOSIS — J209 Acute bronchitis, unspecified: Secondary | ICD-10-CM

## 2019-05-16 DIAGNOSIS — J440 Chronic obstructive pulmonary disease with acute lower respiratory infection: Secondary | ICD-10-CM

## 2019-05-16 DIAGNOSIS — Z720 Tobacco use: Secondary | ICD-10-CM

## 2019-05-16 MED FILL — PANTOPRAZOLE SOD DR 40 MG T: 40 | 30 days supply | Qty: 30 | Fill #1

## 2019-05-16 MED FILL — CLOPIDOGREL 75 MG TABLET: 75 | 30 days supply | Qty: 30 | Fill #1

## 2019-05-16 MED FILL — ATORVASTATIN 80 MG TABLET: 80 | 30 days supply | Qty: 30 | Fill #5

## 2019-05-16 MED FILL — ?HYDROCHLOROTHIAZIDE 12.5M: 12.5 | 30 days supply | Qty: 30 | Fill #1

## 2019-05-16 MED FILL — MONTELUKAST SOD 10 MG TAB: 10 | 30 days supply | Qty: 30 | Fill #0

## 2019-05-16 NOTE — Telephone Encounter (Signed)
Please fill on behalf of previous PCP

## 2019-05-17 MED FILL — !CHANTIX CONT MONTH BOX: 1 | 28 days supply | Qty: 56 | Fill #0

## 2019-05-17 MED FILL — !VENTOLIN HFA INHALER: 108 (90 BAS | 25 days supply | Qty: 18 | Fill #0

## 2019-05-20 ENCOUNTER — Other Ambulatory Visit (HOSPITAL_COMMUNITY)
Admission: RE | Admit: 2019-05-20 | Discharge: 2019-05-20 | Disposition: A | Discharge status: Home / Self Care | Payer: Medicare Other | Source: Ambulatory Visit | Attending: Emergency Medicine | Admitting: Emergency Medicine

## 2019-05-20 DIAGNOSIS — Z20828 Contact with and (suspected) exposure to other viral communicable diseases: Secondary | ICD-10-CM | POA: Diagnosis not present

## 2019-05-20 DIAGNOSIS — Z01812 Encounter for preprocedural laboratory examination: Secondary | ICD-10-CM | POA: Diagnosis not present

## 2019-05-21 LAB — NOVEL CORONAVIRUS, NAA (HOSP ORDER, SEND-OUT TO REF LAB; TAT 18-24 HRS): SARS-CoV-2, NAA: NOT DETECTED

## 2019-05-23 ENCOUNTER — Ambulatory Visit (INDEPENDENT_AMBULATORY_CARE_PROVIDER_SITE_OTHER): Payer: Medicare Other | Admitting: Emergency Medicine

## 2019-05-23 ENCOUNTER — Other Ambulatory Visit: Payer: Self-pay

## 2019-05-23 ENCOUNTER — Telehealth: Payer: Self-pay | Admitting: Nurse Practitioner

## 2019-05-23 ENCOUNTER — Encounter: Payer: Self-pay | Admitting: Emergency Medicine

## 2019-05-23 DIAGNOSIS — Z720 Tobacco use: Secondary | ICD-10-CM

## 2019-05-23 DIAGNOSIS — F17210 Nicotine dependence, cigarettes, uncomplicated: Secondary | ICD-10-CM | POA: Diagnosis not present

## 2019-05-23 DIAGNOSIS — J438 Other emphysema: Secondary | ICD-10-CM | POA: Diagnosis not present

## 2019-05-23 DIAGNOSIS — R0600 Dyspnea, unspecified: Secondary | ICD-10-CM

## 2019-05-23 LAB — PULMONARY FUNCTION TEST
DL/VA % pred: 79 %
DL/VA: 3.25 ml/min/mmHg/L
DLCO cor % pred: 67 %
DLCO cor: 19.2 ml/min/mmHg
DLCO unc % pred: 66 %
DLCO unc: 18.98 ml/min/mmHg
FEF 25-75 Post: 0.93 L/sec
FEF 25-75 Pre: 0.37 L/sec
FEF2575-%Change-Post: 151 %
FEF2575-%Pred-Post: 32 %
FEF2575-%Pred-Pre: 12 %
FEV1-%Change-Post: 27 %
FEV1-%Pred-Post: 38 %
FEV1-%Pred-Pre: 30 %
FEV1-Post: 1.24 L
FEV1-Pre: 0.98 L
FEV1FVC-%Change-Post: -7 %
FEV1FVC-%Pred-Pre: 73 %
FEV6-%Change-Post: 36 %
FEV6-%Pred-Post: 56 %
FEV6-%Pred-Pre: 41 %
FEV6-Post: 2.29 L
FEV6-Pre: 1.67 L
FEV6FVC-%Change-Post: 0 %
FEV6FVC-%Pred-Post: 99 %
FEV6FVC-%Pred-Pre: 100 %
FVC-%Change-Post: 37 %
FVC-%Pred-Post: 56 %
FVC-%Pred-Pre: 40 %
FVC-Post: 2.38 L
FVC-Pre: 1.73 L
Post FEV1/FVC ratio: 52 %
Post FEV6/FVC ratio: 96 %
Pre FEV1/FVC ratio: 57 %
Pre FEV6/FVC Ratio: 97 %
RV % pred: 235 %
RV: 5.78 L
TLC % pred: 120 %
TLC: 8.97 L

## 2019-05-23 NOTE — Telephone Encounter (Signed)
Thank you Briana. So this guy is on the wait list for Dr David Stall at the hospital. The only day he had was for 06-06-2019 @[facility] . Pt declined that, he's awaiting the January schedule for Ardis Hughs to schedule in Jan. 2021. This was to be done at [facility]. I'm not sure if this is considered a screening and can be postponed due the information we were informed of this AM with cases at the hospital.

## 2019-05-23 NOTE — Telephone Encounter (Signed)
Called and spoke with patient-patient informed of status of January date unavailability and patient reports he will await a phone call from the office as to when he is scheduled for EGD/colon at [facility]; patient verbalized appreciation on update of status for procedure;

## 2019-05-23 NOTE — Assessment & Plan Note (Signed)
Discussed cessation strategies with him today.  He will continue Chantix and he has set a quit date for 06/14/2019.

## 2019-05-23 NOTE — Telephone Encounter (Signed)
Please review previous message and let me know if there is anything I can do to assist this patient in getting scheduled for his procedure;

## 2019-05-23 NOTE — Assessment & Plan Note (Signed)
Very severe obstruction with a bronchodilator response noted on his pulmonary function testing.  Continue Stiolto for now.  If he has frequent flares then I will add an inhaled steroid as well.  He has a good exercise tolerance and I do not think he needs a walking oximetry today, diffusion capacity corrects almost to normal for his alveolar volume.  This may become relevant going forward.  Flu shot up-to-date, needs a pneumonia shot next year.  He is interested in increasing his exercise, planning to undertake Silver sneakers.  He can pretreat exercise with albuterol.  We discussed this today.

## 2019-05-23 NOTE — Patient Instructions (Addendum)
We talked today about strategies to stop smoking.  Congratulations on setting a quit date for 06/14/2019!  I have confidence that you can stop, and we will try to help you in any way possible. Continue your Chantix as you have been taking it. Please continue Stiolto 2 puffs once daily. Keep your albuterol available to use 2 puffs if needed for shortness of breath, chest tightness, wheezing.  You can also take 2 puffs prior to exercise. Agree with starting Silver Sneakers exercise routine Continue Singulair (montelukast) as you have been taking it Consider restarting your fluticasone nasal spray, 2 sprays each nostril once daily to help with your nighttime nasal congestion and cough Flu shot up to date, you will need a pneumonia shot next year.  Follow with Dr Lamonte Sakai in 6 months or sooner if you have any problems

## 2019-05-23 NOTE — Progress Notes (Signed)
PFT done today. 

## 2019-05-23 NOTE — Progress Notes (Signed)
 Subjective:    Patient ID: [name], male    DOB: 02/26/1956, 63 y.o.   MRN: [id number]  HPI 63-year-old active smoker (40 pack years) with a history of cocaine abuse, coronary disease/CABG (2015), hypertension, peptic ulcer disease and prior GI bleed, GERD, allergic rhinitis.  He carries a history of COPD that was diagnosed by PFT in 2015 before his CABG, I reviewed, shows severe obstruction without a bronchodilator response, hyperinflated volumes and an intact diffusion capacity. Currently uses Breo, has albuterol HFA and nebs that he uses approximately.  He is on fluticasone nasal spray as needed (allergy season), Singulair, Protonix once daily  He has been having progressive exertional dyspnea, some increased cough.  He was hospitalized 8/27 with chest discomfort and dyspnea that awakened him from sleep.  That was after smoking cocaine, from which he had been abstinent for many years prior.  He was treated for an acute exacerbation of COPD. He improved with steroids. He feels back to baseline - but this is characterized by exertional SOB, has slowly progressed over several years. Especially bothersome in the am when he wakes up. He has cough  - productive of clear mucous, can happen all day long. He is on mucinex. Uses albuterol 3-4x a day, benefits from this. He has cut his cigarettes down to 3 a day, is on chantix. His Prevnar is up to date, flu shot up to date.   ROV 05/23/2019 --Mr. Petion follows up today for his history of tobacco use and presumed COPD.  He also has a history of CAD/CABG, hypertension, PUD, GERD, allergic rhinitis, cocaine use.  I met him in October and his maintenance was Breo.  We tried changing him to Stiolto.  He reports that he feels about the same, his walk distance has increased. He hears some wheeze and cough through the day. He has some congestion and nocturnal cough. He is on singulair, has flonase to use prn but has not been using it. He uses albuterol every  morning, when taking his daily walk. He sometimes has to use albuterol at night to clear secretions.  He is still smoking about 3-5 cig a day. He has set a quit date for 06/14/2019. He is on chantix currently, has been on for 4-5 months.   He underwent pulmonary function testing today that I have reviewed, this shows very severe obstruction with a positive bronchodilator response, profound hyperinflation of his volumes, decreased diffusion capacity that does not completely correct to the normal range when adjusted for alveolar volume.  We talked today about smoking cessation strategies today.    Review of Systems  Constitutional: Negative for fever and unexpected weight change.  HENT: Negative for congestion, dental problem, ear pain, nosebleeds, postnasal drip, rhinorrhea, sinus pressure, sneezing, sore throat and trouble swallowing.   Eyes: Negative for redness and itching.  Respiratory: Positive for cough and shortness of breath. Negative for chest tightness and wheezing.   Cardiovascular: Negative for palpitations and leg swelling.  Gastrointestinal: Negative for nausea and vomiting.  Genitourinary: Negative for dysuria.  Musculoskeletal: Negative for joint swelling.  Skin: Negative for rash.  Neurological: Negative for headaches.  Hematological: Does not bruise/bleed easily.  Psychiatric/Behavioral: Negative for dysphoric mood. The patient is not nervous/anxious.    Past Medical History:  Diagnosis Date  . Anginal pain (HCC)   . CAD (coronary artery disease)    4V CABG 10/31/13  . Cocaine abuse (HCC)   . COPD (chronic obstructive pulmonary disease) (HCC)   .   GERD (gastroesophageal reflux disease)   . H/O: eczema    lower extremities  . H/O: GI bleed    a. 63 y/o ago, r/t ulcer. Denies recent GIB.  Marland Kitchen High cholesterol   . Hypertension   . Myocardial infarction ([place])   . Perforation of duodenal ulcer ([place])    a. 1970s - s/p surgery.   . Shortness of breath dyspnea   . Tobacco  abuse      Family History  Problem Relation Age of Onset  . Lung cancer Mother   . Alzheimer's disease Mother   . Lung cancer Father   . HIV/AIDS Brother   . Obesity Brother   . Lung cancer Other      Social History   Socioeconomic History  . Marital status: Single    Spouse name: Not on file  . Number of children: 1  . Years of education: Not on file  . Highest education level: Bachelor's degree (e.g., BA, AB, BS)  Occupational History  . Occupation: retired  Tobacco Use  . Smoking status: Current Every Day Smoker    Packs/day: 0.25    Years: 40.00    Pack years: 10.00    Types: Cigarettes  . Smokeless tobacco: Never Used  . Tobacco comment: starting Chantix 01/04/18  Substance and Sexual Activity  . Alcohol use: Yes    Alcohol/week: 4.0 standard drinks    Types: 4 Standard drinks or equivalent per week    Comment: vodka  . Drug use: Yes    Types: Marijuana, Cocaine    Comment: "about 1 marijuana cigarette a week"  . Sexual activity: Yes    Birth control/protection: None  Other Topics Concern  . Not on file  Social History Narrative   Pt is right-handed. He lives alone in a one story house. He drinks 2 cups coffee a day, and a liter of ginger-ale. He walks 4 x a week.   Social Determinants of Health   Financial Resource Strain:   . Difficulty of Paying Living Expenses: Not on file  Food Insecurity:   . Worried About Charity fundraiser in the Last Year: Not on file  . Ran Out of Food in the Last Year: Not on file  Transportation Needs:   . Lack of Transportation (Medical): Not on file  . Lack of Transportation (Non-Medical): Not on file  Physical Activity:   . Days of Exercise per Week: Not on file  . Minutes of Exercise per Session: Not on file  Stress:   . Feeling of Stress : Not on file  Social Connections:   . Frequency of Communication with Friends and Family: Not on file  . Frequency of Social Gatherings with Friends and Family: Not on file  .  Attends Religious Services: Not on file  . Active Member of Clubs or Organizations: Not on file  . Attends Archivist Meetings: Not on file  . Marital Status: Not on file  Intimate Partner Violence:   . Fear of Current or Ex-Partner: Not on file  . Emotionally Abused: Not on file  . Physically Abused: Not on file  . Sexually Abused: Not on file    Has worked for phone company Lived in [place] and [place] No inhaled exposures No military   No Known Allergies   Outpatient Medications Prior to Visit  Medication Sig Dispense Refill  . albuterol (PROVENTIL) (2.5 MG/3ML) 0.083% nebulizer solution Take 3 mLs (2.5 mg total) by nebulization every 6 (six) hours  as needed for wheezing or shortness of breath. 150 mL 1  . albuterol (VENTOLIN HFA) 108 (90 Base) MCG/ACT inhaler INHALE 2 PUFFS INTO THE LUNGS EVERY 6 HOURS AS NEEDED FOR WHEEZING OR SHORTNESS OF BREATH 18 g 0  . atorvastatin (LIPITOR) 80 MG tablet Take 1 tablet (80 mg total) by mouth daily at 6 PM. 30 tablet 11  . clopidogrel (PLAVIX) 75 MG tablet Take 1 tablet (75 mg total) by mouth daily. 30 tablet 1  . ferrous fumarate (HEMOCYTE - 106 MG FE) 325 (106 Fe) MG TABS tablet Take 1 tablet (106 mg of iron total) by mouth daily. 30 tablet 0  . fluticasone (FLONASE) 50 MCG/ACT nasal spray Place 2 sprays into both nostrils as needed for allergies or rhinitis.    . hydrochlorothiazide (MICROZIDE) 12.5 MG capsule Take 1 capsule (12.5 mg total) by mouth daily. 90 capsule 1  . montelukast (SINGULAIR) 10 MG tablet TAKE 1 TABLET (10 MG TOTAL) BY MOUTH AT BEDTIME. 30 tablet 3  . Multiple Vitamin (MULTIVITAMIN WITH MINERALS) TABS tablet Take 1 tablet by mouth daily. 30 tablet 1  . pantoprazole (PROTONIX) 40 MG tablet Take 1 tablet (40 mg total) by mouth daily. 30 tablet 1  . Tiotropium Bromide-Olodaterol (STIOLTO RESPIMAT) 2.5-2.5 MCG/ACT AERS Inhale 2 puffs into the lungs daily. 4 g 5  . triamcinolone (KENALOG) 0.025 % ointment APPLY 1 APPLICATION  TOPICALLY 2 TIMES DAILY. 30 g 2  . varenicline (CHANTIX) 1 MG tablet TAKE 1 TABLET (1 MG TOTAL) BY MOUTH 2 (TWO) TIMES DAILY. 56 tablet 0  . vitamin B-12 (CYANOCOBALAMIN) 500 MCG tablet Take 1 tablet (500 mcg total) by mouth daily. 30 tablet 0  . fluticasone furoate-vilanterol (BREO ELLIPTA) 200-25 MCG/INH AEPB Inhale 1 puff into the lungs daily. 180 each 3   No facility-administered medications prior to visit.        Objective:   Physical Exam Vitals:   05/23/19 1003  BP: (!) 142/88  Pulse: 90  SpO2: 98%  Weight: 211 lb 3.2 oz (95.8 kg)  Height: 6' (1.829 m)   Gen: Pleasant, well-nourished, in no distress,  normal affect  ENT: No lesions,  mouth clear,  oropharynx clear, no postnasal drip  Neck: No JVD, no stridor  Lungs: No use of accessory muscles, distant, no wheeze on normal breath or forced exp  Cardiovascular: RRR, heart sounds normal, no murmur or gallops, no peripheral edema  Musculoskeletal: No deformities, no cyanosis or clubbing  Neuro: alert, awake, non focal  Skin: Warm, no lesions or rash      Assessment & Plan:  Chronic obstructive pulmonary disease (HCC) Very severe obstruction with a bronchodilator response noted on his pulmonary function testing.  Continue Stiolto for now.  If he has frequent flares then I will add an inhaled steroid as well.  He has a good exercise tolerance and I do not think he needs a walking oximetry today, diffusion capacity corrects almost to normal for his alveolar volume.  This may become relevant going forward.  Flu shot up-to-date, needs a pneumonia shot next year.  He is interested in increasing his exercise, planning to undertake Silver sneakers.  He can pretreat exercise with albuterol.  We discussed this today.  Tobacco abuse Discussed cessation strategies with him today.  He will continue Chantix and he has set a quit date for 06/14/2019.  Baltazar Apo, MD, PhD 05/23/2019, 11:59 AM [facility] [phone] or if no answer [phone]

## 2019-06-03 MED FILL — STIOLTO RESPIMAT INHAL SPRY: 2.5-2.5 | 28 days supply | Qty: 4 | Fill #1

## 2019-06-17 ENCOUNTER — Telehealth: Payer: Self-pay

## 2019-06-17 NOTE — Telephone Encounter (Signed)
-----   Message from Timothy Lasso, RN sent at 05/06/2019  8:50 AM EST -----  ----- Message ----- From: Elias Else, CMA Sent: 05/06/2019   8:42 AM EST To: Timothy Lasso, RN Subject: RE: Ardis Hughs procedure - EGD/Colon               Dr was supervising and on the officer note he agrees.  From the office note! Editor: Milus Banister, MD (Physician) I agree with the above note, plan. EGD/colonoscoy at [facility] in the next few weeks.  Thanks  Do you have a wait list to add his name or what do you want me to do to keep him from falling in the cracks!   Thx, TT   ----- Message ----- From: Timothy Lasso, RN Sent: 05/06/2019   8:18 AM EST To: Elias Else, CMA Subject: RE: Ardis Hughs procedure - EGD/Colon               No, I do not have any dates yet.  I am waiting and has it been to Dr Ardis Hughs yet to review.  He doesn't like anything to be put on his schedule at the hospital unless it he reviews it and ok's it.  ----- Message ----- From: Elias Else, CMA Sent: 05/01/2019  12:39 PM EST To: Timothy Lasso, RN Subject: Ardis Hughs procedure - EGD/Colon                   Lawrence Santiago wants this patient to have an EGD/colon @[facility] . See office note on 04-30-2019(Plavix too) He wants to wait until after the first of the year, when his medicare supplement kicks in.  Do you have a wait list for Jan 2021? Or do you know the available dates!  Thank you,   Vivien Rota

## 2019-06-18 ENCOUNTER — Telehealth: Payer: Self-pay

## 2019-06-18 ENCOUNTER — Other Ambulatory Visit: Payer: Self-pay | Admitting: Family Medicine

## 2019-06-18 ENCOUNTER — Other Ambulatory Visit: Payer: Self-pay

## 2019-06-18 DIAGNOSIS — K631 Perforation of intestine (nontraumatic): Secondary | ICD-10-CM

## 2019-06-18 DIAGNOSIS — I214 Non-ST elevation (NSTEMI) myocardial infarction: Secondary | ICD-10-CM

## 2019-06-18 DIAGNOSIS — Z8719 Personal history of other diseases of the digestive system: Secondary | ICD-10-CM

## 2019-06-18 DIAGNOSIS — J449 Chronic obstructive pulmonary disease, unspecified: Secondary | ICD-10-CM

## 2019-06-18 DIAGNOSIS — D509 Iron deficiency anemia, unspecified: Secondary | ICD-10-CM

## 2019-06-18 DIAGNOSIS — J209 Acute bronchitis, unspecified: Secondary | ICD-10-CM

## 2019-06-18 DIAGNOSIS — Z720 Tobacco use: Secondary | ICD-10-CM

## 2019-06-18 MED ORDER — CLOPIDOGREL BISULFATE 75 MG PO TABS: 75.0000 mg | ORAL_TABLET | Freq: Every day | ORAL | 1 refills | Status: DC

## 2019-06-18 MED ORDER — PANTOPRAZOLE SODIUM 40 MG PO TBEC: 40.0000 mg | DELAYED_RELEASE_TABLET | Freq: Every day | ORAL | 1 refills | Status: DC

## 2019-06-18 MED FILL — HYDROCHLOROTHIAZIDE 12.5 MG: 12.5 | 30 days supply | Qty: 30 | Fill #2

## 2019-06-18 MED FILL — CLOPIDOGREL 75 MG TABLET: 75 | 30 days supply | Qty: 30 | Fill #0

## 2019-06-18 MED FILL — PANTOPRAZOLE SOD DR 40 MG T: 40 | 30 days supply | Qty: 30 | Fill #0

## 2019-06-18 MED FILL — !CHANTIX CONT MONTH BOX: 1 | 28 days supply | Qty: 56 | Fill #0

## 2019-06-18 NOTE — Telephone Encounter (Signed)
The pt has been advised and instructed for endo colon at [facility] with Dr Ardis Hughs on 07/04/19 at 915 am.  Covid test on 07/01/19.  Anti coag letter sent to cardiology.  Instructions mailed the pt home.

## 2019-06-18 NOTE — Telephone Encounter (Signed)
Refills sent to pharmacy. Thanks.

## 2019-06-21 MED FILL — ALBUTEROL SULFATE HFA 108 (: 108 (90 BAS | 25 days supply | Qty: 18 | Fill #0

## 2019-06-28 ENCOUNTER — Telehealth: Payer: Self-pay | Admitting: Gastroenterology

## 2019-06-28 NOTE — Telephone Encounter (Signed)
Pls call pt, he has questions about stopping plavix. He stated that the letter he received stated to call the office one week before his procedure if nobody has contacted him with instructions about how to hold Plavix.

## 2019-07-01 ENCOUNTER — Other Ambulatory Visit (HOSPITAL_COMMUNITY)
Admission: RE | Admit: 2019-07-01 | Discharge: 2019-07-01 | Disposition: A | Discharge status: Home / Self Care | Payer: Medicare HMO | Source: Ambulatory Visit | Attending: Gastroenterology | Admitting: Gastroenterology

## 2019-07-01 DIAGNOSIS — Z01812 Encounter for preprocedural laboratory examination: Secondary | ICD-10-CM | POA: Insufficient documentation

## 2019-07-01 DIAGNOSIS — Z20822 Contact with and (suspected) exposure to covid-19: Secondary | ICD-10-CM | POA: Diagnosis not present

## 2019-07-01 NOTE — Telephone Encounter (Signed)
Letter was refaxed to cardiology and message left to inform the pt.

## 2019-07-02 LAB — NOVEL CORONAVIRUS, NAA (HOSP ORDER, SEND-OUT TO REF LAB; TAT 18-24 HRS): SARS-CoV-2, NAA: NOT DETECTED

## 2019-07-02 NOTE — Telephone Encounter (Signed)
The pt stopped plavix on 1/16 and will call his cardiologist and make them aware per my recommendations.  I advised him that we needed the ok for him to stop the plavix.  He states he stopped on his on.  I sent the letter once again to cardiology.  I have placed a call to the office to have someone fax a response.  Message was left for a return call.

## 2019-07-04 ENCOUNTER — Other Ambulatory Visit: Payer: Self-pay

## 2019-07-04 ENCOUNTER — Ambulatory Visit (HOSPITAL_COMMUNITY)
Admission: RE | Admit: 2019-07-04 | Discharge: 2019-07-04 | Disposition: A | Discharge status: Home / Self Care | Payer: Medicare HMO | Attending: Gastroenterology | Admitting: Gastroenterology

## 2019-07-04 ENCOUNTER — Encounter (HOSPITAL_COMMUNITY)
Admission: RE | Disposition: A | Discharge status: Home / Self Care | Payer: Self-pay | Source: Home / Self Care | Attending: Gastroenterology

## 2019-07-04 ENCOUNTER — Encounter (HOSPITAL_COMMUNITY): Payer: Self-pay | Admitting: Gastroenterology

## 2019-07-04 ENCOUNTER — Ambulatory Visit (HOSPITAL_COMMUNITY): Payer: Medicare HMO | Admitting: Anesthesiology

## 2019-07-04 DIAGNOSIS — I739 Peripheral vascular disease, unspecified: Secondary | ICD-10-CM | POA: Diagnosis not present

## 2019-07-04 DIAGNOSIS — Z820 Family history of epilepsy and other diseases of the nervous system: Secondary | ICD-10-CM | POA: Insufficient documentation

## 2019-07-04 DIAGNOSIS — K3189 Other diseases of stomach and duodenum: Secondary | ICD-10-CM | POA: Diagnosis not present

## 2019-07-04 DIAGNOSIS — Z8719 Personal history of other diseases of the digestive system: Secondary | ICD-10-CM

## 2019-07-04 DIAGNOSIS — I10 Essential (primary) hypertension: Secondary | ICD-10-CM | POA: Diagnosis not present

## 2019-07-04 DIAGNOSIS — D122 Benign neoplasm of ascending colon: Secondary | ICD-10-CM | POA: Insufficient documentation

## 2019-07-04 DIAGNOSIS — D124 Benign neoplasm of descending colon: Secondary | ICD-10-CM | POA: Diagnosis not present

## 2019-07-04 DIAGNOSIS — I252 Old myocardial infarction: Secondary | ICD-10-CM | POA: Insufficient documentation

## 2019-07-04 DIAGNOSIS — Z951 Presence of aortocoronary bypass graft: Secondary | ICD-10-CM | POA: Insufficient documentation

## 2019-07-04 DIAGNOSIS — Z801 Family history of malignant neoplasm of trachea, bronchus and lung: Secondary | ICD-10-CM | POA: Insufficient documentation

## 2019-07-04 DIAGNOSIS — K635 Polyp of colon: Secondary | ICD-10-CM | POA: Diagnosis not present

## 2019-07-04 DIAGNOSIS — I2510 Atherosclerotic heart disease of native coronary artery without angina pectoris: Secondary | ICD-10-CM | POA: Diagnosis not present

## 2019-07-04 DIAGNOSIS — J449 Chronic obstructive pulmonary disease, unspecified: Secondary | ICD-10-CM | POA: Diagnosis not present

## 2019-07-04 DIAGNOSIS — E7800 Pure hypercholesterolemia, unspecified: Secondary | ICD-10-CM | POA: Insufficient documentation

## 2019-07-04 DIAGNOSIS — K219 Gastro-esophageal reflux disease without esophagitis: Secondary | ICD-10-CM | POA: Insufficient documentation

## 2019-07-04 DIAGNOSIS — D509 Iron deficiency anemia, unspecified: Secondary | ICD-10-CM | POA: Diagnosis not present

## 2019-07-04 DIAGNOSIS — K631 Perforation of intestine (nontraumatic): Secondary | ICD-10-CM

## 2019-07-04 DIAGNOSIS — Z8711 Personal history of peptic ulcer disease: Secondary | ICD-10-CM | POA: Insufficient documentation

## 2019-07-04 DIAGNOSIS — Z8489 Family history of other specified conditions: Secondary | ICD-10-CM | POA: Diagnosis not present

## 2019-07-04 DIAGNOSIS — Z8349 Family history of other endocrine, nutritional and metabolic diseases: Secondary | ICD-10-CM | POA: Diagnosis not present

## 2019-07-04 DIAGNOSIS — K5730 Diverticulosis of large intestine without perforation or abscess without bleeding: Secondary | ICD-10-CM | POA: Diagnosis not present

## 2019-07-04 DIAGNOSIS — F17210 Nicotine dependence, cigarettes, uncomplicated: Secondary | ICD-10-CM | POA: Insufficient documentation

## 2019-07-04 HISTORY — PX: ESOPHAGOGASTRODUODENOSCOPY (EGD) WITH PROPOFOL: SHX5813

## 2019-07-04 HISTORY — PX: POLYPECTOMY: SHX5525

## 2019-07-04 HISTORY — PX: COLONOSCOPY WITH PROPOFOL: SHX5780

## 2019-07-04 SURGERY — ESOPHAGOGASTRODUODENOSCOPY (EGD) WITH PROPOFOL
Anesthesia: General

## 2019-07-04 MED ORDER — ONDANSETRON HCL 4 MG/2ML IJ SOLN
INTRAMUSCULAR | Status: DC | PRN
  Administered 2019-07-04: 4 mg via INTRAVENOUS

## 2019-07-04 MED ORDER — PROPOFOL 500 MG/50ML IV EMUL
INTRAVENOUS | Status: DC | PRN
  Administered 2019-07-04: 100 ug/kg/min via INTRAVENOUS

## 2019-07-04 MED ORDER — LIDOCAINE 2% (20 MG/ML) 5 ML SYRINGE
INTRAMUSCULAR | Status: DC | PRN
  Administered 2019-07-04: 100 mg via INTRAVENOUS

## 2019-07-04 MED ORDER — DEXAMETHASONE SODIUM PHOSPHATE 10 MG/ML IJ SOLN
INTRAMUSCULAR | Status: DC | PRN
  Administered 2019-07-04: 5 mg via INTRAVENOUS

## 2019-07-04 MED ORDER — PROPOFOL 10 MG/ML IV BOLUS
INTRAVENOUS | Status: DC | PRN
  Administered 2019-07-04: 20 mg via INTRAVENOUS
  Administered 2019-07-04: 30 mg via INTRAVENOUS

## 2019-07-04 MED ORDER — PROPOFOL 500 MG/50ML IV EMUL
INTRAVENOUS | Status: AC
  Filled 2019-07-04: qty 50

## 2019-07-04 MED ORDER — SODIUM CHLORIDE 0.9 % IV SOLN: INTRAVENOUS | Status: DC

## 2019-07-04 MED ORDER — LACTATED RINGERS IV SOLN
INTRAVENOUS | Status: DC
  Administered 2019-07-04: 1000 mL via INTRAVENOUS

## 2019-07-04 SURGICAL SUPPLY — 25 items

## 2019-07-04 NOTE — H&P (Signed)
HPI: This is a man with previous IDA, PUD  Here for EGD and colonoscopy to evaluate   ROS: complete GI ROS as described in HPI, all other review negative.  Constitutional:  No unintentional weight loss   Past Medical History:  Diagnosis Date  . Anginal pain ([place])   . CAD (coronary artery disease)    4V CABG 10/31/13  . Cocaine abuse ([place])   . COPD (chronic obstructive pulmonary disease) ([place])   . GERD (gastroesophageal reflux disease)   . H/O: eczema    lower extremities  . H/O: GI bleed    a. 64 y/o ago, r/t ulcer. Denies recent GIB.  Marland Kitchen High cholesterol   . Hypertension   . Myocardial infarction ([place])   . Perforation of duodenal ulcer ([place])    a. 1970s - s/p surgery.   . Shortness of breath dyspnea   . Tobacco abuse     Past Surgical History:  Procedure Laterality Date  . BIOPSY  11/05/2017   Procedure: BIOPSY;  Surgeon: Wilford Corner, MD;  Location: [facility];  Service: Endoscopy;;  . CORONARY ARTERY BYPASS GRAFT N/A 10/31/2013   Procedure: CORONARY ARTERY BYPASS GRAFTING TIMES FOUR ON PUMP USING LEFT INTERNAL MAMMARY ARTERY AND RIGHT GREATER SAPHENOUS VEIN VIA ENDOVEIN HARVEST.;  Surgeon: Melrose Nakayama, MD;  Location: [facility];  Service: Open Heart Surgery;  Laterality: N/A;  . ENDARTERECTOMY Left 11/06/2017   Procedure: ENDARTERECTOMY CAROTID;  Surgeon: Conrad Churchs Ferry, MD;  Location: [place];  Service: Vascular;  Laterality: Left;  . ESOPHAGOGASTRODUODENOSCOPY (EGD) WITH PROPOFOL N/A 11/05/2017   Procedure: ESOPHAGOGASTRODUODENOSCOPY (EGD) WITH PROPOFOL;  Surgeon: Wilford Corner, MD;  Location: [place];  Service: Endoscopy;  Laterality: N/A;  . INTRAOPERATIVE TRANSESOPHAGEAL ECHOCARDIOGRAM N/A 10/31/2013   Procedure: INTRAOPERATIVE TRANSESOPHAGEAL ECHOCARDIOGRAM;  Surgeon: Melrose Nakayama, MD;  Location: [facility];  Service: Open Heart Surgery;  Laterality: N/A;  . LEFT HEART CATHETERIZATION WITH CORONARY ANGIOGRAM N/A 10/28/2013   Procedure: LEFT HEART  CATHETERIZATION WITH CORONARY ANGIOGRAM;  Surgeon: Burnell Blanks, MD;  Location[facility] C[facility] LAB;  Service: Cardiovascular;  Laterality: N/A;  . stomach sx      Current Facility-Administered Medications  Medication Dose Route Frequency Provider Last Rate Last Admin  . 0.9 %  sodium chloride infusion   Intravenous Continuous Milus Banister, MD      . lactated ringers infusion   Intravenous Continuous Milus Banister, MD 125 mL/hr at 07/04/19 0833 1,000 mL at 07/04/19 0833    Allergies as of 06/18/2019  . (No Known Allergies)    Family History  Problem Relation Age of Onset  . Lung cancer Mother   . Alzheimer's disease Mother   . Lung cancer Father   . HIV/AIDS Brother   . Obesity Brother   . Lung cancer Other     Social History   Socioeconomic History  . Marital status: Single    Spouse name: Not on file  . Number of children: 1  . Years of education: Not on file  . Highest education level: Bachelor's degree (e.g., BA, AB, BS)  Occupational History  . Occupation: retired  Tobacco Use  . Smoking status: Current Every Day Smoker    Packs/day: 0.25    Years: 40.00    Pack years: 10.00    Types: Cigarettes  . Smokeless tobacco: Never Used  . Tobacco comment: starting Chantix 01/04/18  Substance and Sexual Activity  . Alcohol use: Yes    Alcohol/week: 4.0 standard drinks  Types: 4 Standard drinks or equivalent per week    Comment: vodka  . Drug use: Yes    Types: Marijuana, Cocaine    Comment: "about 1 marijuana cigarette a week"  . Sexual activity: Yes    Birth control/protection: None  Other Topics Concern  . Not on file  Social History Narrative   Pt is right-handed. He lives alone in a one story house. He drinks 2 cups coffee a day, and a liter of ginger-ale. He walks 4 x a week.   Social Determinants of Health   Financial Resource Strain:   . Difficulty of Paying Living Expenses: Not on file  Food Insecurity:   . Worried About Charity fundraiser  in the Last Year: Not on file  . Ran Out of Food in the Last Year: Not on file  Transportation Needs:   . Lack of Transportation (Medical): Not on file  . Lack of Transportation (Non-Medical): Not on file  Physical Activity:   . Days of Exercise per Week: Not on file  . Minutes of Exercise per Session: Not on file  Stress:   . Feeling of Stress : Not on file  Social Connections:   . Frequency of Communication with Friends and Family: Not on file  . Frequency of Social Gatherings with Friends and Family: Not on file  . Attends Religious Services: Not on file  . Active Member of Clubs or Organizations: Not on file  . Attends Archivist Meetings: Not on file  . Marital Status: Not on file  Intimate Partner Violence:   . Fear of Current or Ex-Partner: Not on file  . Emotionally Abused: Not on file  . Physically Abused: Not on file  . Sexually Abused: Not on file     Physical Exam: BP (!) 142/87   Pulse 80   Temp 98.4 F (36.9 C) (Oral)   Resp 15   Ht 6' (1.829 m)   Wt 95.3 kg   SpO2 97%   BMI 28.48 kg/m  Constitutional: generally well-appearing Psychiatric: alert and oriented x3 Abdomen: soft, nontender, nondistended, no obvious ascites, no peritoneal signs, normal bowel sounds No peripheral edema noted in lower extremities  Assessment and plan: 64 y.o. male with previous IDA (previous PUD, never had colon cancer screening)  colonoscoy and EGD today  Please see the "Patient Instructions" section for addition details about the plan.  Owens Loffler, MD [facility] 07/04/2019, 9:10 AM

## 2019-07-04 NOTE — Transfer of Care (Signed)
Immediate Anesthesia Transfer of Care Note  Patient: [name]  Procedure(s) Performed: ESOPHAGOGASTRODUODENOSCOPY (EGD) WITH PROPOFOL (N/A ) COLONOSCOPY WITH PROPOFOL (N/A ) POLYPECTOMY  Patient Location: PACU and Endoscopy Unit  Anesthesia Type:MAC  Level of Consciousness: drowsy and responds to stimulation  Airway & Oxygen Therapy: Patient Spontanous Breathing and Patient connected to nasal cannula oxygen  Post-op Assessment: Report given to RN and Post -op Vital signs reviewed and stable  Post vital signs: Reviewed and stable  Last Vitals:  Vitals Value Taken Time  BP 106/61 07/04/19 1015  Temp    Pulse 68 07/04/19 1016  Resp 17 07/04/19 1016  SpO2 100 % 07/04/19 1016  Vitals shown include unvalidated device data.  Last Pain:  Vitals:   07/04/19 0757  TempSrc: Oral  PainSc: 0-No pain         Complications: No apparent anesthesia complications

## 2019-07-04 NOTE — Anesthesia Procedure Notes (Signed)
Procedure Name: MAC Date/Time: 07/04/2019 9:28 AM Performed by: Lollie Sails, CRNA Pre-anesthesia Checklist: Patient identified, Emergency Drugs available, Suction available, Patient being monitored and Timeout performed Oxygen Delivery Method: Nasal cannula

## 2019-07-04 NOTE — Anesthesia Postprocedure Evaluation (Signed)
Anesthesia Post Note  Patient: [name]  Procedure(s) Performed: ESOPHAGOGASTRODUODENOSCOPY (EGD) WITH PROPOFOL (N/A ) COLONOSCOPY WITH PROPOFOL (N/A ) POLYPECTOMY     Patient location during evaluation: PACU Anesthesia Type: MAC Level of consciousness: awake and alert Pain management: pain level controlled Vital Signs Assessment: post-procedure vital signs reviewed and stable Respiratory status: spontaneous breathing Cardiovascular status: stable Anesthetic complications: no    Last Vitals:  Vitals:   07/04/19 1030 07/04/19 1040  BP: 127/81 135/82  Pulse: 69 76  Resp: 20 15  Temp:    SpO2: 97% 98%    Last Pain:  Vitals:   07/04/19 1040  TempSrc:   PainSc: 0-No pain                 Nolon Nations

## 2019-07-04 NOTE — Discharge Instructions (Signed)
YOU HAD AN ENDOSCOPIC PROCEDURE TODAY: Refer to the procedure report and other information in the discharge instructions given to you for any specific questions about what was found during the examination. If this information does not answer your questions, please call [place] office at [phone] to clarify.  ° °YOU SHOULD EXPECT: Some feelings of bloating in the abdomen. Passage of more gas than usual. Walking can help get rid of the air that was put into your GI tract during the procedure and reduce the bloating. If you had a lower endoscopy (such as a colonoscopy or flexible sigmoidoscopy) you may notice spotting of blood in your stool or on the toilet paper. Some abdominal soreness may be present for a day or two, also. ° °DIET: Your first meal following the procedure should be a light meal and then it is ok to progress to your normal diet. A half-sandwich or bowl of soup is an example of a good first meal. Heavy or fried foods are harder to digest and may make you feel nauseous or bloated. Drink plenty of fluids but you should avoid alcoholic beverages for 24 hours. If you had a esophageal dilation, please see attached instructions for diet.   ° °ACTIVITY: Your care partner should take you home directly after the procedure. You should plan to take it easy, moving slowly for the rest of the day. You can resume normal activity the day after the procedure however YOU SHOULD NOT DRIVE, use power tools, machinery or perform tasks that involve climbing or major physical exertion for 24 hours (because of the sedation medicines used during the test).  ° °SYMPTOMS TO REPORT IMMEDIATELY: °A gastroenterologist can be reached at any hour. Please call [phone]  for any of the following symptoms:  °Following lower endoscopy (colonoscopy, flexible sigmoidoscopy) °Excessive amounts of blood in the stool  °Significant tenderness, worsening of abdominal pains  °Swelling of the abdomen that is new, acute  °Fever of 100° or  higher  °Following upper endoscopy (EGD, EUS, ERCP, esophageal dilation) °Vomiting of blood or coffee ground material  °New, significant abdominal pain  °New, significant chest pain or pain under the shoulder blades  °Painful or persistently difficult swallowing  °New shortness of breath  °Black, tarry-looking or red, bloody stools ° °FOLLOW UP:  °If any biopsies were taken you will be contacted by phone or by letter within the next 1-3 weeks. Call [phone]  if you have not heard about the biopsies in 3 weeks.  °Please also call with any specific questions about appointments or follow up tests. ° °

## 2019-07-04 NOTE — Anesthesia Preprocedure Evaluation (Addendum)
Anesthesia Evaluation  Patient identified by MRN, date of birth, ID band Patient awake    Reviewed: Allergy & Precautions, NPO status , Patient's Chart, lab work & pertinent test results  Airway Mallampati: II  TM Distance: >3 FB Neck ROM: Full    Dental  (+) Dental Advisory Given, Edentulous Upper, Missing, Poor Dentition, Partial Lower   Pulmonary COPD, Current Smoker and Patient abstained from smoking.,    Pulmonary exam normal breath sounds clear to auscultation       Cardiovascular hypertension, + CAD, + Past MI, + CABG and + Peripheral Vascular Disease  negative cardio ROS Normal cardiovascular exam Rhythm:Regular Rate:Normal     Neuro/Psych negative neurological ROS  negative psych ROS   GI/Hepatic Neg liver ROS, GERD  ,(+)     substance abuse  cocaine use,   Endo/Other  negative endocrine ROS  Renal/GU negative Renal ROS  negative genitourinary   Musculoskeletal negative musculoskeletal ROS (+)   Abdominal   Peds negative pediatric ROS (+)  Hematology negative hematology ROS (+)   Anesthesia Other Findings   Reproductive/Obstetrics negative OB ROS                             Anesthesia Physical  Anesthesia Plan  ASA: III  Anesthesia Plan: MAC   Post-op Pain Management:    Induction: Intravenous  PONV Risk Score and Plan: 2 and Ondansetron, Dexamethasone and Treatment may vary due to age or medical condition  Airway Management Planned: Natural Airway  Additional Equipment:   Intra-op Plan:   Post-operative Plan:   Informed Consent: I have reviewed the patients History and Physical, chart, labs and discussed the procedure including the risks, benefits and alternatives for the proposed anesthesia with the patient or authorized representative who has indicated his/her understanding and acceptance.     Dental advisory given  Plan Discussed with:  CRNA  Anesthesia Plan Comments:        Anesthesia Quick Evaluation

## 2019-07-04 NOTE — Op Note (Signed)
[facility] Patient Name: [name] Procedure Date: 07/04/2019 MRN: [id number] Attending MD: Milus Banister , MD Date of Birth: 1956-03-12 CSN: [id number] Age: 64 Admit Type: Outpatient Procedure:                Colonoscopy Indications:              IDA (resolved on iron supplement) Providers:                Milus Banister, MD, Debi Mays, RN, Lina Sar,                            Technician, Corie Chiquito, Technician, Edman Circle.                            Zenia Resides CRNA, CRNA Referring MD:              Medicines:                Monitored Anesthesia Care Complications:            No immediate complications. Estimated blood loss:                            None. Estimated Blood Loss:     Estimated blood loss: none. Procedure:                Pre-Anesthesia Assessment:                           - Prior to the procedure, a History and Physical                            was performed, and patient medications and                            allergies were reviewed. The patient's tolerance of                            previous anesthesia was also reviewed. The risks                            and benefits of the procedure and the sedation                            options and risks were discussed with the patient.                            All questions were answered, and informed consent                            was obtained. Prior Anticoagulants: The patient has                            taken Plavix (clopidogrel), last dose was 5 days  prior to procedure. ASA Grade Assessment: IV - A                            patient with severe systemic disease that is a                            constant threat to life. After reviewing the risks                            and benefits, the patient was deemed in                            satisfactory condition to undergo the procedure.                           After obtaining informed consent, the  colonoscope                            was passed under direct vision. Throughout the                            procedure, the patient's blood pressure, pulse, and                            oxygen saturations were monitored continuously. The                            [id number] Olympus colonoscope was                            introduced through the anus and advanced to the the                            cecum, identified by appendiceal orifice and                            ileocecal valve. The colonoscopy was performed                            without difficulty. The patient tolerated the                            procedure well. The quality of the bowel                            preparation was good. The ileocecal valve,                            appendiceal orifice, and rectum were photographed. Scope In: 9:31:11 AM Scope Out: 9:55:19 AM Scope Withdrawal Time: 0 hours 18 minutes 26 seconds  Total Procedure Duration: 0 hours 24 minutes 8 seconds  Findings:      Three semi-pedunculated polyps were found in the descending colon and       ascending colon. The  polyps were 6 to 9 mm in size. These polyps were       removed with a cold snare. Resection and retrieval were complete.      Multiple small and large-mouthed diverticula were found in the entire       colon.      The exam was otherwise without abnormality on direct and retroflexion       views. Impression:               - Three 6 to 9 mm polyps in the descending colon                            and in the ascending colon, removed with a cold                            snare. Resected and retrieved.                           - Diverticulosis in the entire examined colon.                           - The examination was otherwise normal on direct                            and retroflexion views. Moderate Sedation:      Not Applicable - Patient had care per Anesthesia. Recommendation:           - Await pathology  results.                           - EGD now. Procedure Code(s):        --- Professional ---                           [phone], Colonoscopy, flexible; with removal of                            tumor(s), polyp(s), or other lesion(s) by snare                            technique Diagnosis Code(s):        --- Professional ---                           K63.5, Polyp of colon                           R10.32, Left lower quadrant pain                           K57.30, Diverticulosis of large intestine without                            perforation or abscess without bleeding CPT copyright 2019 American [employer] Association. All rights reserved. The codes documented in this report are preliminary and upon coder review may  be revised to meet current compliance requirements. Milus Banister, MD 07/04/2019 9:58:24  AM This report has been signed electronically. Number of Addenda: 0

## 2019-07-04 NOTE — Op Note (Signed)
[facility] Patient Name: [name] Procedure Date: 07/04/2019 MRN: [id number] Attending MD: Milus Banister , MD Date of Birth: 01-23-1956 CSN: [id number] Age: 64 Admit Type: Outpatient Procedure:                Upper GI endoscopy Indications:              Iron deficiency anemia Providers:                Milus Banister, MD, Debi Mays, RN, Lina Sar,                            Technician, Corie Chiquito, Technician, Edman Circle.                            Zenia Resides CRNA, CRNA Referring MD:              Medicines:                Monitored Anesthesia Care Complications:            No immediate complications. Estimated blood loss:                            None. Estimated Blood Loss:     Estimated blood loss: none. Procedure:                Pre-Anesthesia Assessment:                           - Prior to the procedure, a History and Physical                            was performed, and patient medications and                            allergies were reviewed. The patient's tolerance of                            previous anesthesia was also reviewed. The risks                            and benefits of the procedure and the sedation                            options and risks were discussed with the patient.                            All questions were answered, and informed consent                            was obtained. Prior Anticoagulants: The patient has                            taken Plavix (clopidogrel), last dose was 5 days  prior to procedure. ASA Grade Assessment: IV - A                            patient with severe systemic disease that is a                            constant threat to life. After reviewing the risks                            and benefits, the patient was deemed in                            satisfactory condition to undergo the procedure.                           After obtaining informed consent, the  endoscope was                            passed under direct vision. Throughout the                            procedure, the patient's blood pressure, pulse, and                            oxygen saturations were monitored continuously. The                            [id number]) Olympus gastroscope was                            introduced through the mouth, and advanced to the                            second part of duodenum. The upper GI endoscopy was                            accomplished without difficulty. The patient                            tolerated the procedure well. Scope In: Scope Out: Findings:      The esophagus was normal.      Soft, bulbuous anatomic deformity of the distal stomach, prepylorus that       is unchanged from 2019 EGD images taken by Dr. Michail Sermon. See images.      The examined duodenum was normal. Impression:               - Anatomic deformity of the distal stomach,                            prepylorus that is unchanged from 2019 EGD images                            taken by Dr. Michail Sermon. I agree this is likely  related to his very remote ulcer surgery.                           - Otherwise normal examination. Moderate Sedation:      Not Applicable - Patient had care per Anesthesia. Recommendation:           - Patient has a contact number available for                            emergencies. The signs and symptoms of potential                            delayed complications were discussed with the                            patient. Return to normal activities tomorrow.                            Written discharge instructions were provided to the                            patient.                           - Resume previous diet.                           - Continue present medications. Stay on iron pill                            once daily. OK to resume plavix today. Procedure Code(s):        --- Professional  ---                           [phone], Esophagogastroduodenoscopy, flexible,                            transoral; diagnostic, including collection of                            specimen(s) by brushing or washing, when performed                            (separate procedure) Diagnosis Code(s):        --- Professional ---                           D50.9, Iron deficiency anemia, unspecified CPT copyright 2019 American Medical Association. All rights reserved. The codes documented in this report are preliminary and upon coder review may  be revised to meet current compliance requirements. Milus Banister, MD 07/04/2019 10:11:55 AM This report has been signed electronically. Number of Addenda: 0

## 2019-07-05 LAB — SURGICAL PATHOLOGY

## 2019-07-08 ENCOUNTER — Encounter: Payer: Self-pay | Admitting: *Deleted

## 2019-07-08 ENCOUNTER — Other Ambulatory Visit: Payer: Self-pay

## 2019-07-08 DIAGNOSIS — D509 Iron deficiency anemia, unspecified: Secondary | ICD-10-CM

## 2019-07-09 MED FILL — ATORVASTATIN 80 MG TABLET: 80 | 30 days supply | Qty: 30 | Fill #6

## 2019-07-09 MED FILL — MONTELUKAST SOD 10 MG TAB: 10 | 30 days supply | Qty: 30 | Fill #1

## 2019-07-10 ENCOUNTER — Ambulatory Visit (INDEPENDENT_AMBULATORY_CARE_PROVIDER_SITE_OTHER): Payer: Medicare HMO | Admitting: Nurse Practitioner

## 2019-07-10 ENCOUNTER — Encounter: Payer: Self-pay | Admitting: Nurse Practitioner

## 2019-07-10 ENCOUNTER — Other Ambulatory Visit: Payer: Self-pay

## 2019-07-10 VITALS — BP 137/71 | HR 81 | Temp 98.6°F | Resp 18 | Ht 73.0 in | Wt 217.0 lb

## 2019-07-10 DIAGNOSIS — E43 Unspecified severe protein-calorie malnutrition: Secondary | ICD-10-CM

## 2019-07-10 DIAGNOSIS — Z0000 Encounter for general adult medical examination without abnormal findings: Secondary | ICD-10-CM

## 2019-07-10 DIAGNOSIS — R739 Hyperglycemia, unspecified: Secondary | ICD-10-CM | POA: Diagnosis not present

## 2019-07-10 DIAGNOSIS — L309 Dermatitis, unspecified: Secondary | ICD-10-CM | POA: Diagnosis not present

## 2019-07-10 DIAGNOSIS — J438 Other emphysema: Secondary | ICD-10-CM | POA: Diagnosis not present

## 2019-07-10 DIAGNOSIS — I10 Essential (primary) hypertension: Secondary | ICD-10-CM | POA: Diagnosis not present

## 2019-07-10 DIAGNOSIS — E559 Vitamin D deficiency, unspecified: Secondary | ICD-10-CM | POA: Diagnosis not present

## 2019-07-10 DIAGNOSIS — D649 Anemia, unspecified: Secondary | ICD-10-CM

## 2019-07-10 LAB — POCT URINALYSIS DIPSTICK
Bilirubin, UA: NEGATIVE
Blood, UA: NEGATIVE
Glucose, UA: NEGATIVE
Ketones, UA: NEGATIVE
Leukocytes, UA: NEGATIVE
Nitrite, UA: NEGATIVE
Protein, UA: NEGATIVE
Spec Grav, UA: 1.02 (ref 1.010–1.025)
Urobilinogen, UA: 0.2 E.U./dL
pH, UA: 6 (ref 5.0–8.0)

## 2019-07-10 MED ORDER — TRIAMCINOLONE ACETONIDE 0.025 % EX OINT: TOPICAL_OINTMENT | CUTANEOUS | 2 refills | Status: DC

## 2019-07-10 MED FILL — TRIAMCINOLONE 0.025% OINT: 0.025 | 15 days supply | Qty: 30 | Fill #0

## 2019-07-10 NOTE — Patient Instructions (Signed)
Steps to Quit Smoking Smoking tobacco is the leading cause of preventable death. It can affect almost every organ in the body. Smoking puts you and people around you at risk for many serious, long-lasting (chronic) diseases. Quitting smoking can be hard, but it is one of the best things that you can do for your health. It is never too late to quit. How do I get ready to quit? When you decide to quit smoking, make a plan to help you succeed. Before you quit:  Pick a date to quit. Set a date within the next 2 weeks to give you time to prepare.  Write down the reasons why you are quitting. Keep this list in places where you will see it often.  Tell your family, friends, and co-workers that you are quitting. Their support is important.  Talk with your doctor about the choices that may help you quit.  Find out if your health insurance will pay for these treatments.  Know the people, places, things, and activities that make you want to smoke (triggers). Avoid them. What first steps can I take to quit smoking?  Throw away all cigarettes at home, at work, and in your car.  Throw away the things that you use when you smoke, such as ashtrays and lighters.  Clean your car. Make sure to empty the ashtray.  Clean your home, including curtains and carpets. What can I do to help me quit smoking? Talk with your doctor about taking medicines and seeing a counselor at the same time. You are more likely to succeed when you do both.  If you are pregnant or breastfeeding, talk with your doctor about counseling or other ways to quit smoking. Do not take medicine to help you quit smoking unless your doctor tells you to do so. To quit smoking: Quit right away  Quit smoking totally, instead of slowly cutting back on how much you smoke over a period of time.  Go to counseling. You are more likely to quit if you go to counseling sessions regularly. Take medicine You may take medicines to help you quit. Some  medicines need a prescription, and some you can buy over-the-counter. Some medicines may contain a drug called nicotine to replace the nicotine in cigarettes. Medicines may:  Help you to stop having the desire to smoke (cravings).  Help to stop the problems that come when you stop smoking (withdrawal symptoms). Your doctor may ask you to use:  Nicotine patches, gum, or lozenges.  Nicotine inhalers or sprays.  Non-nicotine medicine that is taken by mouth. Find resources Find resources and other ways to help you quit smoking and remain smoke-free after you quit. These resources are most helpful when you use them often. They include:  Online chats with a counselor.  Phone quitlines.  Printed self-help materials.  Support groups or group counseling.  Text messaging programs.  Mobile phone apps. Use apps on your mobile phone or tablet that can help you stick to your quit plan. There are many free apps for mobile phones and tablets as well as websites. Examples include Quit Guide from the CDC and smokefree.gov  What things can I do to make it easier to quit?   Talk to your family and friends. Ask them to support and encourage you.  Call a phone quitline (1-800-QUIT-NOW), reach out to support groups, or work with a counselor.  Ask people who smoke to not smoke around you.  Avoid places that make you want to smoke,   such as: ? Bars. ? Parties. ? Smoke-break areas at work.  Spend time with people who do not smoke.  Lower the stress in your life. Stress can make you want to smoke. Try these things to help your stress: ? Getting regular exercise. ? Doing deep-breathing exercises. ? Doing yoga. ? Meditating. ? Doing a body scan. To do this, close your eyes, focus on one area of your body at a time from head to toe. Notice which parts of your body are tense. Try to relax the muscles in those areas. How will I feel when I quit smoking? Day 1 to 3 weeks Within the first 24 hours,  you may start to have some problems that come from quitting tobacco. These problems are very bad 2-3 days after you quit, but they do not often last for more than 2-3 weeks. You may get these symptoms:  Mood swings.  Feeling restless, nervous, angry, or annoyed.  Trouble concentrating.  Dizziness.  Strong desire for high-sugar foods and nicotine.  Weight gain.  Trouble pooping (constipation).  Feeling like you may vomit (nausea).  Coughing or a sore throat.  Changes in how the medicines that you take for other issues work in your body.  Depression.  Trouble sleeping (insomnia). Week 3 and afterward After the first 2-3 weeks of quitting, you may start to notice more positive results, such as:  Better sense of smell and taste.  Less coughing and sore throat.  Slower heart rate.  Lower blood pressure.  Clearer skin.  Better breathing.  Fewer sick days. Quitting smoking can be hard. Do not give up if you fail the first time. Some people need to try a few times before they succeed. Do your best to stick to your quit plan, and talk with your doctor if you have any questions or concerns. Summary  Smoking tobacco is the leading cause of preventable death. Quitting smoking can be hard, but it is one of the best things that you can do for your health.  When you decide to quit smoking, make a plan to help you succeed.  Quit smoking right away, not slowly over a period of time.  When you start quitting, seek help from your doctor, family, or friends. This information is not intended to replace advice given to you by your health care provider. Make sure you discuss any questions you have with your health care provider. Document Revised: 02/22/2019 Document Reviewed: 08/18/2018 [employer] Patient Education  2020 [employer]  

## 2019-07-10 NOTE — Progress Notes (Signed)
Established Patient Office Visit  Subjective:  Patient ID: [name], male    DOB: 07-Apr-1956  Age: 64 y.o. MRN: [id number]  CC:  Chief Complaint  Patient presents with  . Hypertension  . COPD    HPI [name] presents for follow up.  He has a history of coronary artery disease status post CABG, previous stroke on Plavix, COPD, hypertension, GERD.  He was recently hospitalized for iron deficiency anemia secondary to GI bleeding.  He is status post endoscopy and colonoscopy.  He feels that he is doing well.   He states that he went in because he was having shortness of breath. He states "his breathing is ok". He admits that "his lungs are poor". He has shortness of breath with exertion. He smokes about 5 cigarettes per day per day. He denies any chest.  He did have a change in his inhaler.  He continues to use the Ventolin daily. As for his hypertension, he feels that his blood pressure is well controlled on the hydrochlorothiazide 12.5 mg.  He denies any visual changes, headaches, dizziness, chest pains, swelling in legs feet or ankles.   Past Medical History:  Diagnosis Date  . Anginal pain ([place])   . CAD (coronary artery disease)    4V CABG 10/31/13  . Cocaine abuse ([place])   . COPD (chronic obstructive pulmonary disease) ([place])   . GERD (gastroesophageal reflux disease)   . H/O: eczema    lower extremities  . H/O: GI bleed    a. 64 y/o ago, r/t ulcer. Denies recent GIB.  Marland Kitchen High cholesterol   . Hypertension   . Myocardial infarction ([place])   . Perforation of duodenal ulcer ([place])    a. 1970s - s/p surgery.   . Shortness of breath dyspnea   . Tobacco abuse     Past Surgical History:  Procedure Laterality Date  . BIOPSY  11/05/2017   Procedure: BIOPSY;  Surgeon: Wilford Corner, MD;  Location: [facility];  Service: Endoscopy;;  . COLONOSCOPY WITH PROPOFOL N/A 07/04/2019   Procedure: COLONOSCOPY WITH PROPOFOL;  Surgeon: Milus Banister, MD;  Location: [facility]  END[facility]COPY;  Service: Endoscopy;  Laterality: N/A;  . CORONARY ARTERY BYPASS GRAFT N/A 10/31/2013   Procedure: CORONARY ARTERY BYPASS GRAFTING TIMES FOUR ON PUMP USING LEFT INTERNAL MAMMARY ARTERY AND RIGHT GREATER SAPHENOUS VEIN VIA ENDOVEIN HARVEST.;  Surgeon: Melrose Nakayama, MD;  Location: [facility];  Service: Open Heart Surgery;  Laterality: N/A;  . ENDARTERECTOMY Left 11/06/2017   Procedure: ENDARTERECTOMY CAROTID;  Surgeon: Conrad Rockford, MD;  Location: [facility] [place];  Service: Vascular;  Laterality: Left;  . ESOPHAGOGASTRODUODENOSCOPY (EGD) WITH PROPOFOL N/A 11/05/2017   Procedure: ESOPHAGOGASTRODUODENOSCOPY (EGD) WITH PROPOFOL;  Surgeon: Wilford Corner, MD;  Location: [place];  Service: Endoscopy;  Laterality: N/A;  . ESOPHAGOGASTRODUODENOSCOPY (EGD) WITH PROPOFOL N/A 07/04/2019   Procedure: ESOPHAGOGASTRODUODENOSCOPY (EGD) WITH PROPOFOL;  Surgeon: Milus Banister, MD;  Location[facility];  Service: Endoscopy;  Laterality: N/A;  . INTRAOPERATIVE TRANSESOPHAGEAL ECHOCARDIOGRAM N/A 10/31/2013   Procedure: INTRAOPERATIVE TRANSESOPHAGEAL ECHOCARDIOGRAM;  Surgeon: Melrose Nakayama, MD;  Location: [place];  Service: Open Heart Surgery;  Laterality: N/A;  . LEFT HEART CATHETERIZATION WITH CORONARY ANGIOGRAM N/A 10/28/2013   Procedure: LEFT HEART CATHETERIZATION WITH CORONARY ANGIOGRAM;  Surgeon: Burnell Blanks, MD;  [facility] C[facility] LAB;  Service: Cardiovascular;  Laterality: N/A;  . POLYPECTOMY  07/04/2019   Procedure: POLYPECTOMY;  Surgeon: Milus Banister, MD;  Location: [facility];  Service: Endoscopy;;  . stomach sx      Family History  Problem Relation Age of Onset  . Lung cancer Mother   . Alzheimer's disease Mother   . Lung cancer Father   . HIV/AIDS Brother   . Obesity Brother   . Lung cancer Other     Social History   Socioeconomic History  . Marital status: Single    Spouse name: Not on file  . Number of children: 1  . Years of education: Not on file  .  Highest education level: Bachelor's degree (e.g., BA, AB, BS)  Occupational History  . Occupation: retired  Tobacco Use  . Smoking status: Current Every Day Smoker    Packs/day: 0.25    Years: 40.00    Pack years: 10.00    Types: Cigarettes  . Smokeless tobacco: Never Used  . Tobacco comment: starting Chantix 01/04/18  Substance and Sexual Activity  . Alcohol use: Yes    Alcohol/week: 4.0 standard drinks    Types: 4 Standard drinks or equivalent per week    Comment: vodka  . Drug use: Yes    Types: Marijuana, Cocaine    Comment: "about 1 marijuana cigarette a week"  . Sexual activity: Yes    Birth control/protection: None  Other Topics Concern  . Not on file  Social History Narrative   Pt is right-handed. He lives alone in a one story house. He drinks 2 cups coffee a day, and a liter of ginger-ale. He walks 4 x a week.   Social Determinants of Health   Financial Resource Strain:   . Difficulty of Paying Living Expenses: Not on file  Food Insecurity:   . Worried About Charity fundraiser in the Last Year: Not on file  . Ran Out of Food in the Last Year: Not on file  Transportation Needs:   . Lack of Transportation (Medical): Not on file  . Lack of Transportation (Non-Medical): Not on file  Physical Activity:   . Days of Exercise per Week: Not on file  . Minutes of Exercise per Session: Not on file  Stress:   . Feeling of Stress : Not on file  Social Connections:   . Frequency of Communication with Friends and Family: Not on file  . Frequency of Social Gatherings with Friends and Family: Not on file  . Attends Religious Services: Not on file  . Active Member of Clubs or Organizations: Not on file  . Attends Archivist Meetings: Not on file  . Marital Status: Not on file  Intimate Partner Violence:   . Fear of Current or Ex-Partner: Not on file  . Emotionally Abused: Not on file  . Physically Abused: Not on file  . Sexually Abused: Not on file     Outpatient Medications Prior to Visit  Medication Sig Dispense Refill  . albuterol (VENTOLIN HFA) 108 (90 Base) MCG/ACT inhaler INHALE 2 PUFFS INTO THE LUNGS EVERY 6 HOURS AS NEEDED FOR WHEEZING OR SHORTNESS OF BREATH (Patient taking differently: Inhale 2 puffs into the lungs every 6 (six) hours as needed for wheezing or shortness of breath. ) 18 g 0  . atorvastatin (LIPITOR) 80 MG tablet Take 1 tablet (80 mg total) by mouth daily at 6 PM. 30 tablet 11  . clopidogrel (PLAVIX) 75 MG tablet Take 1 tablet (75 mg total) by mouth daily. 30 tablet 1  . ferrous fumarate (HEMOCYTE - 106 MG FE) 325 (106 Fe) MG TABS tablet Take 1 tablet (  106 mg of iron total) by mouth daily. 30 tablet 0  . guaiFENesin (MUCINEX) 600 MG 12 hr tablet Take 600 mg by mouth 2 (two) times daily as needed (cough/congestion.).    Marland Kitchen hydrochlorothiazide (MICROZIDE) 12.5 MG capsule Take 1 capsule (12.5 mg total) by mouth daily. 90 capsule 1  . montelukast (SINGULAIR) 10 MG tablet TAKE 1 TABLET (10 MG TOTAL) BY MOUTH AT BEDTIME. 30 tablet 3  . Multiple Vitamin (MULTIVITAMIN WITH MINERALS) TABS tablet Take 1 tablet by mouth daily. 30 tablet 1  . pantoprazole (PROTONIX) 40 MG tablet Take 1 tablet (40 mg total) by mouth daily. 30 tablet 1  . Tiotropium Bromide-Olodaterol (STIOLTO RESPIMAT) 2.5-2.5 MCG/ACT AERS Inhale 2 puffs into the lungs daily. 4 g 5  . varenicline (CHANTIX CONTINUING MONTH PAK) 1 MG tablet TAKE 1 TABLET (1 MG TOTAL) BY MOUTH 2 (TWO) TIMES DAILY. (Patient taking differently: Take 1 mg by mouth 2 (two) times daily. TAKE 1 TABLET (1 MG TOTAL) BY MOUTH 2 (TWO) TIMES DAILY.) 56 tablet 0  . vitamin B-12 (CYANOCOBALAMIN) 500 MCG tablet Take 1 tablet (500 mcg total) by mouth daily. 30 tablet 0  . triamcinolone (KENALOG) 0.025 % ointment APPLY 1 APPLICATION TOPICALLY 2 TIMES DAILY. (Patient taking differently: Apply 1 application topically 2 (two) times daily as needed (skin irritation.). ) 30 g 2  . albuterol (PROVENTIL) (2.5  MG/3ML) 0.083% nebulizer solution Take 3 mLs (2.5 mg total) by nebulization every 6 (six) hours as needed for wheezing or shortness of breath. (Patient not taking: Reported on 07/10/2019) 150 mL 1  . fluticasone (FLONASE) 50 MCG/ACT nasal spray Place 2 sprays into both nostrils as needed for allergies or rhinitis.     No facility-administered medications prior to visit.    No Known Allergies  ROS Review of Systems  Constitutional: Negative.   HENT: Negative.   Eyes: Negative.   Respiratory: Positive for shortness of breath.   Cardiovascular: Negative.   Gastrointestinal: Negative.   Endocrine: Negative.   Genitourinary: Negative.   Musculoskeletal: Negative.   Skin: Negative.   Allergic/Immunologic: Negative.   Neurological: Negative.   Hematological: Negative.   Psychiatric/Behavioral: Negative.       Objective:    Physical Exam  Constitutional: He is oriented to person, place, and time. He appears well-developed and well-nourished.  HENT:  Head: Normocephalic.  Cardiovascular: Normal rate, regular rhythm and normal heart sounds.  Pulmonary/Chest: Effort normal.  Abdominal: Soft. Bowel sounds are normal.  Umbilical hernia    Musculoskeletal:        General: Edema present.     Cervical back: Normal range of motion and neck supple.     Comments: 1+ BLE  Neurological: He is alert and oriented to person, place, and time.  Skin: Skin is warm and dry.  Psychiatric: He has a normal mood and affect. His behavior is normal. Judgment and thought content normal.    BP 137/71 (BP Location: Left Arm, Patient Position: Sitting, Cuff Size: Large)   Pulse 81   Temp 98.6 F (37 C) (Oral)   Resp 18   Ht 6\' 1"  (1.854 m)   Wt 217 lb (98.4 kg)   SpO2 98%   BMI 28.63 kg/m  Wt Readings from Last 3 Encounters:  07/10/19 217 lb (98.4 kg)  07/04/19 210 lb (95.3 kg)  05/23/19 211 lb 3.2 oz (95.8 kg)     There are no preventive care reminders to display for this patient.  There  are no preventive care  reminders to display for this patient.  Lab Results  Component Value Date   TSH 0.478 11/05/2017   Lab Results  Component Value Date   WBC 6.8 04/30/2019   HGB 14.2 04/30/2019   HCT 43.4 04/30/2019   MCV 82.6 04/30/2019   PLT 294.0 04/30/2019   Lab Results  Component Value Date   NA 138 04/30/2019   K 4.0 04/30/2019   CO2 29 04/30/2019   GLUCOSE 92 04/30/2019   BUN 9 04/30/2019   CREATININE 1.08 04/30/2019   BILITOT 0.5 02/21/2019   ALKPHOS 121 (H) 02/21/2019   AST 18 02/21/2019   ALT 22 02/21/2019   PROT 6.1 02/21/2019   ALBUMIN 3.8 02/21/2019   CALCIUM 9.6 04/30/2019   ANIONGAP 11 02/08/2019   GFR 82.90 04/30/2019   Lab Results  Component Value Date   CHOL 97 11/05/2017   Lab Results  Component Value Date   HDL 44 11/05/2017   Lab Results  Component Value Date   LDLCALC 42 11/05/2017   Lab Results  Component Value Date   TRIG 57 11/05/2017   Lab Results  Component Value Date   CHOLHDL 2.2 11/05/2017   Lab Results  Component Value Date   HGBA1C 6.0 (H) 02/07/2019      Assessment & Plan:   Problem List Items Addressed This Visit      Unprioritized   Chronic obstructive pulmonary disease ([place])   Essential hypertension - Primary   Relevant Orders   Urinalysis Dipstick (Completed)   Comp. Metabolic Panel (12)   Protein-calorie malnutrition, severe ([place])    Other Visit Diagnoses    Eczema of upper extremity       Relevant Medications   triamcinolone (KENALOG) 0.025 % ointment   Health care maintenance       Relevant Orders   Vitamin B12   25-Hydroxy vitamin D Lcms D2+D3   Lipid Panel   Vitamin D, 25-hydroxy   Anemia, unspecified type       On Plavix Reinforced bleeding risk   Relevant Orders   Iron      Meds ordered this encounter  Medications  . triamcinolone (KENALOG) 0.025 % ointment    Sig: APPLY 1 APPLICATION TOPICALLY 2 TIMES DAILY.    Dispense:  30 g    Refill:  2    Order Specific Question:    Supervising Provider    Answer:   Tresa Garter [id number]    Follow-up: Return in about 3 months (around 10/08/2019).    Vevelyn Francois, NP

## 2019-07-11 LAB — COMP. METABOLIC PANEL (12)
AST: 20 IU/L (ref 0–40)
Albumin/Globulin Ratio: 2.2 (ref 1.2–2.2)
Albumin: 4.1 g/dL (ref 3.8–4.8)
Alkaline Phosphatase: 138 IU/L — ABNORMAL HIGH (ref 39–117)
BUN/Creatinine Ratio: 10 (ref 10–24)
BUN: 11 mg/dL (ref 8–27)
Bilirubin Total: 0.2 mg/dL (ref 0.0–1.2)
Calcium: 9.5 mg/dL (ref 8.6–10.2)
Chloride: 103 mmol/L (ref 96–106)
Creatinine, Ser: 1.07 mg/dL (ref 0.76–1.27)
GFR calc Af Amer: 84 mL/min/{1.73_m2} (ref >59)
GFR calc non Af Amer: 72 mL/min/{1.73_m2} (ref >59)
Globulin, Total: 1.9 g/dL (ref 1.5–4.5)
Glucose: 101 mg/dL — ABNORMAL HIGH (ref 65–99)
Potassium: 3.7 mmol/L (ref 3.5–5.2)
Sodium: 140 mmol/L (ref 134–144)
Total Protein: 6 g/dL (ref 6.0–8.5)

## 2019-07-11 LAB — VITAMIN B12: Vitamin B-12: 841 pg/mL (ref 232–1245)

## 2019-07-11 LAB — LIPID PANEL
Chol/HDL Ratio: 2.3 ratio (ref 0.0–5.0)
Cholesterol, Total: 159 mg/dL (ref 100–199)
HDL: 68 mg/dL (ref >39)
LDL Chol Calc (NIH): 69 mg/dL (ref 0–99)
Triglycerides: 129 mg/dL (ref 0–149)
VLDL Cholesterol Cal: 22 mg/dL (ref 5–40)

## 2019-07-11 LAB — IRON: Iron: 59 ug/dL (ref 38–169)

## 2019-07-11 LAB — VITAMIN D 25 HYDROXY (VIT D DEFICIENCY, FRACTURES): Vit D, 25-Hydroxy: 17.8 ng/mL — ABNORMAL LOW (ref 30.0–100.0)

## 2019-07-12 ENCOUNTER — Telehealth: Payer: Self-pay | Admitting: Emergency Medicine

## 2019-07-12 NOTE — Telephone Encounter (Signed)
Spoke with [name], he is requesting Breo because the Stiolto is too expensive for him. He is going to look up his formulary online to see what he can have that is not too expensive. Will await return call.   Patient Instructions by Collene Gobble, MD at 05/23/2019 10:00 AM Author: Collene Gobble, MD Author Type: Physician Filed: 05/23/2019 10:27 AM  Note Status: Addendum Cosign: Cosign Not Required Encounter Date: 05/23/2019  Editor: Collene Gobble, MD (Physician)  Prior Versions: 1. Collene Gobble, MD (Physician) at 05/23/2019 10:26 AM - Addendum   2. Collene Gobble, MD (Physician) at 05/23/2019 10:26 AM - Signed    We talked today about strategies to stop smoking.  Congratulations on setting a quit date for 06/14/2019!  I have confidence that you can stop, and we will try to help you in any way possible. Continue your Chantix as you have been taking it. Please continue Stiolto 2 puffs once daily. Keep your albuterol available to use 2 puffs if needed for shortness of breath, chest tightness, wheezing.  You can also take 2 puffs prior to exercise. Agree with starting Silver Sneakers exercise routine Continue Singulair (montelukast) as you have been taking it Consider restarting your fluticasone nasal spray, 2 sprays each nostril once daily to help with your nighttime nasal congestion and cough Flu shot up to date, you will need a pneumonia shot next year.  Follow with Dr Lamonte Sakai in 6 months or sooner if you have any problems

## 2019-07-17 ENCOUNTER — Other Ambulatory Visit: Payer: Self-pay | Admitting: Nurse Practitioner

## 2019-07-17 ENCOUNTER — Telehealth: Payer: Self-pay

## 2019-07-17 ENCOUNTER — Encounter: Payer: Self-pay | Admitting: Nurse Practitioner

## 2019-07-17 DIAGNOSIS — E559 Vitamin D deficiency, unspecified: Secondary | ICD-10-CM | POA: Insufficient documentation

## 2019-07-17 MED ORDER — ERGOCALCIFEROL 1.25 MG (50000 UT) PO CAPS: 50000.0000 [IU] | ORAL_CAPSULE | ORAL | 0 refills | Status: AC

## 2019-07-17 MED FILL — VIT D2 1.25 MG (50,000 UNIT: 1.25 MG | 28 days supply | Qty: 4 | Fill #0

## 2019-07-17 NOTE — Telephone Encounter (Signed)
-----   Message from Vevelyn Francois, NP sent at 07/17/2019  2:53 PM EST ----- Vitamin D deficiencyVitamin D 50,000 units weekly for 12 weeks then daily vitamin D 2000 units we will repeat vitamin D in 1 year

## 2019-07-17 NOTE — Telephone Encounter (Signed)
Called and spoke with patient, advised that vitamin D was low and that vitamin D once weekly 50,000 units is being prescribed to take for 12 weeks. Asked that when he complete rx he take otc vitamin D 2000 units daily. Advised that we will recheck in 1 year. Thanks!

## 2019-07-22 ENCOUNTER — Other Ambulatory Visit: Payer: Self-pay | Admitting: Family Medicine

## 2019-07-22 DIAGNOSIS — J449 Chronic obstructive pulmonary disease, unspecified: Secondary | ICD-10-CM

## 2019-07-22 DIAGNOSIS — J209 Acute bronchitis, unspecified: Secondary | ICD-10-CM

## 2019-07-22 DIAGNOSIS — Z720 Tobacco use: Secondary | ICD-10-CM

## 2019-07-22 MED FILL — PANTOPRAZOLE SOD DR 40 MG T: 40 | 30 days supply | Qty: 30 | Fill #1

## 2019-07-22 MED FILL — HYDROCHLOROTHIAZIDE 12.5 MG: 12.5 | 30 days supply | Qty: 30 | Fill #3

## 2019-07-22 MED FILL — CLOPIDOGREL 75 MG TABLET: 75 | 30 days supply | Qty: 30 | Fill #1

## 2019-07-22 MED FILL — TRIAMCINOLONE 0.025% OINT: 0.025 | 15 days supply | Qty: 30 | Fill #0

## 2019-07-22 NOTE — Telephone Encounter (Signed)
Patient requesting a refill of Chantix

## 2019-07-25 ENCOUNTER — Other Ambulatory Visit: Payer: Self-pay | Admitting: Family Medicine

## 2019-07-25 DIAGNOSIS — Z720 Tobacco use: Secondary | ICD-10-CM

## 2019-07-25 DIAGNOSIS — J209 Acute bronchitis, unspecified: Secondary | ICD-10-CM

## 2019-07-25 DIAGNOSIS — J449 Chronic obstructive pulmonary disease, unspecified: Secondary | ICD-10-CM

## 2019-07-25 NOTE — Telephone Encounter (Signed)
Patient requesting refill Chantix

## 2019-07-29 MED FILL — ALBUTEROL SULFATE HFA 108 (: 108 (90 BAS | 25 days supply | Qty: 18 | Fill #0

## 2019-08-14 ENCOUNTER — Other Ambulatory Visit: Payer: Self-pay | Admitting: Internal Medicine

## 2019-08-14 ENCOUNTER — Other Ambulatory Visit: Payer: Self-pay | Admitting: Family Medicine

## 2019-08-14 ENCOUNTER — Other Ambulatory Visit: Payer: Self-pay | Admitting: Nurse Practitioner

## 2019-08-14 DIAGNOSIS — I10 Essential (primary) hypertension: Secondary | ICD-10-CM

## 2019-08-14 DIAGNOSIS — I214 Non-ST elevation (NSTEMI) myocardial infarction: Secondary | ICD-10-CM

## 2019-08-14 MED FILL — ATORVASTATIN 80 MG TABLET: 80 | 30 days supply | Qty: 30 | Fill #0

## 2019-08-14 MED FILL — VIT D2 1.25 MG (50,000 UNIT: 1.25 MG | 28 days supply | Qty: 4 | Fill #1

## 2019-08-14 MED FILL — CLOPIDOGREL 75 MG TABLET: 75 | 30 days supply | Qty: 30 | Fill #0

## 2019-08-14 MED FILL — MONTELUKAST SOD 10 MG TAB: 10 | 30 days supply | Qty: 30 | Fill #2

## 2019-08-14 MED FILL — TRIAMCINOLONE 0.025% OINT: 0.025 | 15 days supply | Qty: 30 | Fill #1

## 2019-08-14 MED FILL — CHANTIX 1 MG CONT MONTH BOX: 1 | 28 days supply | Qty: 56 | Fill #0

## 2019-08-14 MED FILL — PANTOPRAZOLE SOD DR 40 MG T: 40 | 30 days supply | Qty: 30 | Fill #0

## 2019-08-15 MED FILL — STIOLTO RESPIMAT INHAL SPRY: 2.5-2.5 | 28 days supply | Qty: 4 | Fill #2

## 2019-08-15 MED FILL — ALBUTEROL SULFATE HFA 108 (: 108 (90 BAS | 25 days supply | Qty: 18 | Fill #0

## 2019-09-19 ENCOUNTER — Other Ambulatory Visit: Payer: Self-pay | Admitting: Family Medicine

## 2019-09-19 ENCOUNTER — Other Ambulatory Visit: Payer: Self-pay | Admitting: Nurse Practitioner

## 2019-09-19 DIAGNOSIS — J209 Acute bronchitis, unspecified: Secondary | ICD-10-CM

## 2019-09-19 DIAGNOSIS — J440 Chronic obstructive pulmonary disease with acute lower respiratory infection: Secondary | ICD-10-CM

## 2019-09-19 DIAGNOSIS — Z720 Tobacco use: Secondary | ICD-10-CM

## 2019-09-19 DIAGNOSIS — J449 Chronic obstructive pulmonary disease, unspecified: Secondary | ICD-10-CM

## 2019-09-19 MED FILL — CLOPIDOGREL 75 MG TABLET: 75 | 30 days supply | Qty: 30 | Fill #1

## 2019-09-19 MED FILL — VIT D2 1.25 MG (50,000 UNIT: 1.25 MG | 28 days supply | Qty: 4 | Fill #2

## 2019-09-19 MED FILL — ATORVASTATIN 80 MG TABLET: 80 | 90 days supply | Qty: 90 | Fill #1

## 2019-09-19 MED FILL — MONTELUKAST SOD 10 MG TAB: 10 | 30 days supply | Qty: 30 | Fill #3

## 2019-09-19 MED FILL — HYDROCHLOROTHIAZIDE 12.5 MG: 12.5 | 30 days supply | Qty: 30 | Fill #4

## 2019-09-19 MED FILL — TRIAMCINOLONE 0.025% OINT: 0.025 | 15 days supply | Qty: 30 | Fill #2

## 2019-09-19 MED FILL — PANTOPRAZOLE SOD DR 40 MG T: 40 | 30 days supply | Qty: 30 | Fill #1

## 2019-09-20 MED FILL — CHANTIX 1 MG CONT MONTH BOX: 1 | 28 days supply | Qty: 56 | Fill #0

## 2019-09-26 ENCOUNTER — Other Ambulatory Visit: Payer: Self-pay

## 2019-09-26 DIAGNOSIS — J449 Chronic obstructive pulmonary disease, unspecified: Secondary | ICD-10-CM

## 2019-09-26 DIAGNOSIS — I10 Essential (primary) hypertension: Secondary | ICD-10-CM

## 2019-09-26 DIAGNOSIS — J209 Acute bronchitis, unspecified: Secondary | ICD-10-CM

## 2019-09-26 DIAGNOSIS — I214 Non-ST elevation (NSTEMI) myocardial infarction: Secondary | ICD-10-CM

## 2019-09-26 DIAGNOSIS — L309 Dermatitis, unspecified: Secondary | ICD-10-CM

## 2019-09-26 DIAGNOSIS — Z720 Tobacco use: Secondary | ICD-10-CM

## 2019-09-26 DIAGNOSIS — J440 Chronic obstructive pulmonary disease with acute lower respiratory infection: Secondary | ICD-10-CM

## 2019-09-26 MED ORDER — VARENICLINE TARTRATE 1 MG PO TABS: ORAL_TABLET | ORAL | 3 refills | Status: DC

## 2019-09-26 MED ORDER — CLOPIDOGREL BISULFATE 75 MG PO TABS: 75.0000 mg | ORAL_TABLET | Freq: Every day | ORAL | 1 refills | Status: DC

## 2019-09-26 MED ORDER — ALBUTEROL SULFATE HFA 108 (90 BASE) MCG/ACT IN AERS: INHALATION_SPRAY | RESPIRATORY_TRACT | 3 refills | Status: DC

## 2019-09-26 MED ORDER — HYDROCHLOROTHIAZIDE 12.5 MG PO CAPS: 12.5000 mg | ORAL_CAPSULE | Freq: Every day | ORAL | 1 refills | Status: DC

## 2019-09-26 MED ORDER — MONTELUKAST SODIUM 10 MG PO TABS: 10.0000 mg | ORAL_TABLET | Freq: Every day | ORAL | 1 refills | Status: DC

## 2019-09-26 MED ORDER — TRIAMCINOLONE ACETONIDE 0.025 % EX OINT: TOPICAL_OINTMENT | CUTANEOUS | 2 refills | Status: DC

## 2019-09-26 MED ORDER — ATORVASTATIN CALCIUM 80 MG PO TABS: 80.0000 mg | ORAL_TABLET | Freq: Every day | ORAL | 1 refills | Status: DC

## 2019-09-26 MED ORDER — PANTOPRAZOLE SODIUM 40 MG PO TBEC: 40.0000 mg | DELAYED_RELEASE_TABLET | Freq: Every day | ORAL | 1 refills | Status: DC

## 2019-10-01 MED FILL — ALBUTEROL SULFATE HFA 108 (: 108 (90 BAS | 25 days supply | Qty: 18 | Fill #0

## 2019-10-01 MED FILL — STIOLTO RESPIMAT INHAL SPRY: 2.5-2.5 | 28 days supply | Qty: 4 | Fill #3

## 2019-10-02 ENCOUNTER — Other Ambulatory Visit: Payer: Self-pay | Admitting: *Deleted

## 2019-10-02 MED ORDER — STIOLTO RESPIMAT 2.5-2.5 MCG/ACT IN AERS: 2.0000 | INHALATION_SPRAY | Freq: Every day | RESPIRATORY_TRACT | 0 refills | Status: DC

## 2019-10-09 ENCOUNTER — Encounter: Payer: Self-pay | Admitting: Nurse Practitioner

## 2019-10-09 ENCOUNTER — Other Ambulatory Visit: Payer: Self-pay

## 2019-10-09 ENCOUNTER — Ambulatory Visit (INDEPENDENT_AMBULATORY_CARE_PROVIDER_SITE_OTHER): Payer: Medicare Other | Admitting: Nurse Practitioner

## 2019-10-09 VITALS — BP 140/70 | HR 94 | Ht 72.0 in | Wt 205.6 lb

## 2019-10-09 DIAGNOSIS — Z720 Tobacco use: Secondary | ICD-10-CM | POA: Diagnosis not present

## 2019-10-09 DIAGNOSIS — I10 Essential (primary) hypertension: Secondary | ICD-10-CM

## 2019-10-09 DIAGNOSIS — E7800 Pure hypercholesterolemia, unspecified: Secondary | ICD-10-CM

## 2019-10-09 DIAGNOSIS — R0600 Dyspnea, unspecified: Secondary | ICD-10-CM

## 2019-10-09 DIAGNOSIS — J449 Chronic obstructive pulmonary disease, unspecified: Secondary | ICD-10-CM | POA: Diagnosis not present

## 2019-10-09 DIAGNOSIS — J301 Allergic rhinitis due to pollen: Secondary | ICD-10-CM

## 2019-10-09 DIAGNOSIS — R609 Edema, unspecified: Secondary | ICD-10-CM

## 2019-10-09 DIAGNOSIS — R0609 Other forms of dyspnea: Secondary | ICD-10-CM

## 2019-10-09 LAB — POCT URINALYSIS DIPSTICK
Blood, UA: NEGATIVE
Glucose, UA: NEGATIVE
Leukocytes, UA: NEGATIVE
Nitrite, UA: NEGATIVE
Protein, UA: POSITIVE — AB
Spec Grav, UA: 1.025 (ref 1.010–1.025)
Urobilinogen, UA: 1 E.U./dL
pH, UA: 7 (ref 5.0–8.0)

## 2019-10-09 MED ORDER — FLUTICASONE PROPIONATE 50 MCG/ACT NA SUSP: 2.0000 | NASAL | 2 refills | Status: DC | PRN

## 2019-10-09 MED ORDER — ALBUTEROL SULFATE HFA 108 (90 BASE) MCG/ACT IN AERS: 1.0000 | INHALATION_SPRAY | RESPIRATORY_TRACT | 2 refills | Status: DC | PRN

## 2019-10-09 MED FILL — FLUTICASONE PROP 50 MCG SPR: 50 | 30 days supply | Qty: 16 | Fill #0

## 2019-10-09 NOTE — Progress Notes (Signed)
[address] [place], [place]  [address] Phone:  [phone]   Fax:  [phone]   Established Patient Office Visit  Subjective:  Patient ID: [name], male    DOB: 1955/10/22  Age: 64 y.o. MRN: [id number]  CC:  Chief Complaint  Patient presents with  . Follow-up    3 month follow up, having some shortness of breath     HPI [name] presents for follow up. He  has a past medical history of Anginal pain ([place]), CAD (coronary artery disease), Cocaine abuse ([place]), COPD (chronic obstructive pulmonary disease) ([place]), GERD (gastroesophageal reflux disease), H/O: eczema, H/O: GI bleed, High cholesterol, Hypertension, Myocardial infarction [facility]), Perforation of duodenal ulcer ([place]), Shortness of breath dyspnea, and Tobacco abuse.   Dyspnea Patient complains of shortness of breath. Symptoms occur mostly in the mornings and with activity.  He feels like it is worse in the mornings after waking up. Symptoms began Sometime ago however they are getting worse.. Associated symptoms include  dyspnea on exertion, morning cough and wheezing. He denies cough after eating, drainage from nose and tightness in chest. He has not had recent travel. Weight has been stable. Symptoms are exacerbated by lying flat and walking.  He sleeps mainly during the day 5 to 6 hours.  He does feel rested.  He nods off at night.  Symptoms are alleviated by rest and medication(s) (albuterol).   A discussion regarding smoking cessation.  He currently smokes 5 cigarettes per day. He has attempted to quit smoking in the past, most recently He is taking Chantix  ago. Best success quitting using Work in progress. Barriers to quitting include: Unknown. He denies chest pain and hemoptysis.  Past Medical History:  Diagnosis Date  . Anginal pain ([place])   . CAD (coronary artery disease)    4V CABG 10/31/13  . Cocaine abuse ([place])   . COPD (chronic obstructive pulmonary disease) ([place])   .  GERD (gastroesophageal reflux disease)   . H/O: eczema    lower extremities  . H/O: GI bleed    a. 64 y/o ago, r/t ulcer. Denies recent GIB.  Marland Kitchen High cholesterol   . Hypertension   . Myocardial infarction ([place])   . Perforation of duodenal ulcer ([place])    a. 1970s - s/p surgery.   . Shortness of breath dyspnea   . Tobacco abuse     Past Surgical History:  Procedure Laterality Date  . BIOPSY  11/05/2017   Procedure: BIOPSY;  Surgeon: Wilford Corner, MD;  Location: [place];  Service: Endoscopy;;  . COLONOSCOPY WITH PROPOFOL N/A 07/04/2019   Procedure: COLONOSCOPY WITH PROPOFOL;  Surgeon: Milus Banister, MD;  Location: [facility];  Service: Endoscopy;  Laterality: N/A;  . CORONARY ARTERY BYPASS GRAFT N/A 10/31/2013   Procedure: CORONARY ARTERY BYPASS GRAFTING TIMES FOUR ON PUMP USING LEFT INTERNAL MAMMARY ARTERY AND RIGHT GREATER SAPHENOUS VEIN VIA ENDOVEIN HARVEST.;  Surgeon: Melrose Nakayama, MD;  Location: [place];  Service: Open Heart Surgery;  Laterality: N/A;  . ENDARTERECTOMY Left 11/06/2017   Procedure: ENDARTERECTOMY CAROTID;  Surgeon: Conrad Mont Alto, MD;  Location: [facility];  Service: Vascular;  Laterality: Left;  . ESOPHAGOGASTRODUODENOSCOPY (EGD) WITH PROPOFOL N/A 11/05/2017   Procedure: ESOPHAGOGASTRODUODENOSCOPY (EGD) WITH PROPOFOL;  Surgeon: Wilford Corner, MD;  Location: [place];  Service: Endoscopy;  Laterality: N/A;  . ESOPHAGOGASTRODUODENOSCOPY (EGD) WITH PROPOFOL N/A 07/04/2019   Procedure: ESOPHAGOGASTRODUODENOSCOPY (EGD) WITH PROPOFOL;  Surgeon: Owens Loffler  P, MD;  [facility];  Service: Endoscopy;  Laterality: N/A;  . INTRAOPERATIVE TRANSESOPHAGEAL ECHOCARDIOGRAM N/A 10/31/2013   Procedure: INTRAOPERATIVE TRANSESOPHAGEAL ECHOCARDIOGRAM;  Surgeon: Melrose Nakayama, MD;  Location: [facility];  Service: Open Heart Surgery;  Laterality: N/A;  . LEFT HEART CATHETERIZATION WITH CORONARY ANGIOGRAM N/A 10/28/2013   Procedure: LEFT HEART CATHETERIZATION  WITH CORONARY ANGIOGRAM;  Surgeon: Burnell Blanks, MD;  Location: [facility] CATH LAB;  Service: Cardiovascular;  Laterality: N/A;  . POLYPECTOMY  07/04/2019   Procedure: POLYPECTOMY;  Surgeon: Milus Banister, MD;  [facility];  Service: Endoscopy;;  . stomach sx      Family History  Problem Relation Age of Onset  . Lung cancer Mother   . Alzheimer's disease Mother   . Lung cancer Father   . HIV/AIDS Brother   . Obesity Brother   . Lung cancer Other     Social History   Socioeconomic History  . Marital status: Single    Spouse name: Not on file  . Number of children: 1  . Years of education: Not on file  . Highest education level: Bachelor's degree (e.g., BA, AB, BS)  Occupational History  . Occupation: retired  Tobacco Use  . Smoking status: Current Every Day Smoker    Packs/day: 0.25    Years: 40.00    Pack years: 10.00    Types: Cigarettes  . Smokeless tobacco: Never Used  . Tobacco comment: starting Chantix 01/04/18  Substance and Sexual Activity  . Alcohol use: Yes    Alcohol/week: 4.0 standard drinks    Types: 4 Standard drinks or equivalent per week    Comment: vodka  . Drug use: Yes    Types: Marijuana, Cocaine    Comment: "about 1 marijuana cigarette a week"  . Sexual activity: Yes    Birth control/protection: None  Other Topics Concern  . Not on file  Social History Narrative   Pt is right-handed. He lives alone in a one story house. He drinks 2 cups coffee a day, and a liter of ginger-ale. He walks 4 x a week.   Social Determinants of Health   Financial Resource Strain:   . Difficulty of Paying Living Expenses:   Food Insecurity:   . Worried About Charity fundraiser in the Last Year:   . Arboriculturist in the Last Year:   Transportation Needs:   . Film/video editor (Medical):   Marland Kitchen Lack of Transportation (Non-Medical):   Physical Activity:   . Days of Exercise per Week:   . Minutes of Exercise per Session:   Stress:   . Feeling  of Stress :   Social Connections:   . Frequency of Communication with Friends and Family:   . Frequency of Social Gatherings with Friends and Family:   . Attends Religious Services:   . Active Member of Clubs or Organizations:   . Attends Archivist Meetings:   Marland Kitchen Marital Status:   Intimate Partner Violence:   . Fear of Current or Ex-Partner:   . Emotionally Abused:   Marland Kitchen Physically Abused:   . Sexually Abused:     Outpatient Medications Prior to Visit  Medication Sig Dispense Refill  . atorvastatin (LIPITOR) 80 MG tablet Take 1 tablet (80 mg total) by mouth daily at 6 PM. 90 tablet 1  . clopidogrel (PLAVIX) 75 MG tablet Take 1 tablet (75 mg total) by mouth daily. 90 tablet 1  . ergocalciferol (VITAMIN D2) 1.25 MG (  50000 UT) capsule Take 1 capsule (50,000 Units total) by mouth once a week. X 12 weeks. 12 capsule 0  . ferrous fumarate (HEMOCYTE - 106 MG FE) 325 (106 Fe) MG TABS tablet Take 1 tablet (106 mg of iron total) by mouth daily. 30 tablet 0  . guaiFENesin (MUCINEX) 600 MG 12 hr tablet Take 600 mg by mouth 2 (two) times daily as needed (cough/congestion.).    Marland Kitchen hydrochlorothiazide (MICROZIDE) 12.5 MG capsule Take 1 capsule (12.5 mg total) by mouth daily. 90 capsule 1  . montelukast (SINGULAIR) 10 MG tablet Take 1 tablet (10 mg total) by mouth at bedtime. 90 tablet 1  . Multiple Vitamin (MULTIVITAMIN WITH MINERALS) TABS tablet Take 1 tablet by mouth daily. 30 tablet 1  . pantoprazole (PROTONIX) 40 MG tablet Take 1 tablet (40 mg total) by mouth daily. 90 tablet 1  . Tiotropium Bromide-Olodaterol (STIOLTO RESPIMAT) 2.5-2.5 MCG/ACT AERS Inhale 2 puffs into the lungs daily. 12 g 0  . triamcinolone (KENALOG) 0.025 % ointment APPLY 1 APPLICATION TOPICALLY 2 TIMES DAILY. 30 g 2  . varenicline (CHANTIX CONTINUING MONTH PAK) 1 MG tablet TAKE 1 TABLET (1 MG TOTAL) BY MOUTH 2 (TWO) TIMES DAILY. 56 tablet 0  . varenicline (CHANTIX CONTINUING MONTH PAK) 1 MG tablet TAKE 1 TABLET (1 MG  TOTAL) BY MOUTH 2 (TWO) TIMES DAILY. 56 tablet 3  . vitamin B-12 (CYANOCOBALAMIN) 500 MCG tablet Take 1 tablet (500 mcg total) by mouth daily. 30 tablet 0  . albuterol (VENTOLIN HFA) 108 (90 Base) MCG/ACT inhaler Use 1 to 2 puffs by mouth every 4 to 6 hours as needed. 18 g 3  . albuterol (PROVENTIL) (2.5 MG/3ML) 0.083% nebulizer solution Take 3 mLs (2.5 mg total) by nebulization every 6 (six) hours as needed for wheezing or shortness of breath. (Patient not taking: Reported on 07/10/2019) 150 mL 1  . fluticasone (FLONASE) 50 MCG/ACT nasal spray Place 2 sprays into both nostrils as needed for allergies or rhinitis.     No facility-administered medications prior to visit.    No Known Allergies  ROS Review of Systems  All other systems reviewed and are negative.     Objective:    Physical Exam  Constitutional: He is oriented to person, place, and time. He appears well-developed.  HENT:  Head: Normocephalic.  Cardiovascular: Normal rate, regular rhythm, normal heart sounds and intact distal pulses.  Pulmonary/Chest:  Notable dyspnea on exertion with walking Decreased BS  Musculoskeletal:        General: Edema present. Normal range of motion.     Cervical back: Normal range of motion.     Comments: 1+  Neurological: He is alert and oriented to person, place, and time.  Skin: Skin is warm and dry.  Psychiatric: He has a normal mood and affect. His behavior is normal. Judgment and thought content normal.    BP 140/70 (BP Location: Left Arm, Patient Position: Sitting)   Pulse 94   Ht 6' (1.829 m)   Wt 205 lb 9.6 oz (93.3 kg)   SpO2 97%   BMI 27.88 kg/m  Wt Readings from Last 3 Encounters:  10/09/19 205 lb 9.6 oz (93.3 kg)  07/10/19 217 lb (98.4 kg)  07/04/19 210 lb (95.3 kg)     There are no preventive care reminders to display for this patient.  There are no preventive care reminders to display for this patient.  Lab Results  Component Value Date   TSH 0.478 11/05/2017    Lab  Results  Component Value Date   WBC 6.8 04/30/2019   HGB 14.2 04/30/2019   HCT 43.4 04/30/2019   MCV 82.6 04/30/2019   PLT 294.0 04/30/2019   Lab Results  Component Value Date   NA 140 07/10/2019   K 3.7 07/10/2019   CO2 29 04/30/2019   GLUCOSE 101 (H) 07/10/2019   BUN 11 07/10/2019   CREATININE 1.07 07/10/2019   BILITOT 0.2 07/10/2019   ALKPHOS 138 (H) 07/10/2019   AST 20 07/10/2019   ALT 22 02/21/2019   PROT 6.0 07/10/2019   ALBUMIN 4.1 07/10/2019   CALCIUM 9.5 07/10/2019   ANIONGAP 11 02/08/2019   GFR 82.90 04/30/2019   Lab Results  Component Value Date   CHOL 159 07/10/2019   Lab Results  Component Value Date   HDL 68 07/10/2019   Lab Results  Component Value Date   LDLCALC 69 07/10/2019   Lab Results  Component Value Date   TRIG 129 07/10/2019   Lab Results  Component Value Date   CHOLHDL 2.3 07/10/2019   Lab Results  Component Value Date   HGBA1C 6.0 (H) 02/07/2019      Assessment & Plan:   Problem List Items Addressed This Visit      Unprioritized   Chronic obstructive pulmonary disease (HCC)   Relevant Medications   albuterol (PROAIR HFA) 108 (90 Base) MCG/ACT inhaler   fluticasone (FLONASE) 50 MCG/ACT nasal spray   Other Relevant Orders   For home use only DME Nebulizer machine   DG Chest 2 View   Essential hypertension - Primary   Relevant Orders   POCT Urinalysis Dipstick (Completed)   Pure hypercholesterolemia   Tobacco abuse (Chronic)    Other Visit Diagnoses    Dyspnea on exertion       Chest x-ray pending Refill on proair Home nebulizer ordered   Edema, peripheral       Hydrochlorothiazide Encouraged elevation of lower extremities and supportive stockings/socks   Seasonal allergic rhinitis due to pollen       Flonase refill      Meds ordered this encounter  Medications  . albuterol (PROAIR HFA) 108 (90 Base) MCG/ACT inhaler    Sig: Inhale 1-2 puffs into the lungs every 4 (four) hours as needed for wheezing or  shortness of breath.    Dispense:  18 g    Refill:  2    Order Specific Question:   Supervising Provider    Answer:   Tresa Garter [id number]  . fluticasone (FLONASE) 50 MCG/ACT nasal spray    Sig: Place 2 sprays into both nostrils as needed for allergies or rhinitis.    Dispense:  10 mL    Refill:  2    Order Specific Question:   Supervising Provider    Answer:   Tresa Garter [id number]    Follow-up: Return in about 3 months (around 01/08/2020).    Vevelyn Francois, NP

## 2019-10-09 NOTE — Patient Instructions (Signed)
Shortness of Breath, Adult Shortness of breath means you have trouble breathing. Shortness of breath could be a sign of a medical problem. Follow these instructions at home:   Watch for any changes in your symptoms.  Do not use any products that contain nicotine or tobacco, such as cigarettes, e-cigarettes, and chewing tobacco.  Do not smoke. Smoking can cause shortness of breath. If you need help to quit smoking, ask your doctor.  Avoid things that can make it harder to breathe, such as: ? Mold. ? Dust. ? Air pollution. ? Chemical smells. ? Things that can cause allergy symptoms (allergens), if you have allergies.  Keep your living space clean. Use products that help remove mold and dust.  Rest as needed. Slowly return to your normal activities.  Take over-the-counter and prescription medicines only as told by your doctor. This includes oxygen therapy and inhaled medicines.  Keep all follow-up visits as told by your doctor. This is important. Contact a doctor if:  Your condition does not get better as soon as expected.  You have a hard time doing your normal activities, even after you rest.  You have new symptoms. Get help right away if:  Your shortness of breath gets worse.  You have trouble breathing when you are resting.  You feel light-headed or you pass out (faint).  You have a cough that is not helped by medicines.  You cough up blood.  You have pain with breathing.  You have pain in your chest, arms, shoulders, or belly (abdomen).  You have a fever.  You cannot walk up stairs.  You cannot exercise the way you normally do. These symptoms may represent a serious problem that is an emergency. Do not wait to see if the symptoms will go away. Get medical help right away. Call your local emergency services (911 in the [place] Do not drive yourself to the hospital. Summary  Shortness of breath is when you have trouble breathing enough air. It can be a sign of a  medical problem.  Avoid things that make it hard for you to breathe, such as smoking, pollution, mold, and dust.  Watch for any changes in your symptoms. Contact your doctor if you do not get better or you get worse. This information is not intended to replace advice given to you by your health care provider. Make sure you discuss any questions you have with your health care provider. Document Revised: 10/30/2017 Document Reviewed: 10/30/2017 Elsevier Patient Education  2020 Elsevier Inc.  

## 2019-10-18 ENCOUNTER — Other Ambulatory Visit: Payer: Self-pay | Admitting: Nurse Practitioner

## 2019-10-18 ENCOUNTER — Telehealth: Payer: Self-pay | Admitting: Nurse Practitioner

## 2019-10-18 ENCOUNTER — Other Ambulatory Visit: Payer: Self-pay

## 2019-10-18 DIAGNOSIS — J449 Chronic obstructive pulmonary disease, unspecified: Secondary | ICD-10-CM

## 2019-10-18 MED ORDER — ALBUTEROL SULFATE HFA 108 (90 BASE) MCG/ACT IN AERS: 1.0000 | INHALATION_SPRAY | RESPIRATORY_TRACT | 2 refills | Status: DC | PRN

## 2019-10-18 MED ORDER — ALBUTEROL SULFATE (2.5 MG/3ML) 0.083% IN NEBU: 2.5000 mg | INHALATION_SOLUTION | Freq: Four times a day (QID) | RESPIRATORY_TRACT | 1 refills | Status: DC | PRN

## 2019-10-18 MED FILL — ALBUTEROL SUL 2.5 MG/3 ML S: (2.5 MG/3ML | 36 days supply | Qty: 180 | Fill #0

## 2019-10-18 NOTE — Telephone Encounter (Signed)
Sent!

## 2019-10-18 NOTE — Telephone Encounter (Signed)
Pt wants albuterol inhaler decreased to 6.7 because his insurance doesn't cover the higher dose. Please follow up with pt

## 2019-10-22 ENCOUNTER — Other Ambulatory Visit: Payer: Self-pay | Admitting: Nurse Practitioner

## 2019-10-22 ENCOUNTER — Other Ambulatory Visit: Payer: Self-pay | Admitting: Internal Medicine

## 2019-10-22 DIAGNOSIS — Z720 Tobacco use: Secondary | ICD-10-CM

## 2019-10-22 DIAGNOSIS — J449 Chronic obstructive pulmonary disease, unspecified: Secondary | ICD-10-CM

## 2019-10-22 DIAGNOSIS — I214 Non-ST elevation (NSTEMI) myocardial infarction: Secondary | ICD-10-CM

## 2019-10-24 ENCOUNTER — Other Ambulatory Visit: Payer: Self-pay | Admitting: Nurse Practitioner

## 2019-11-19 ENCOUNTER — Other Ambulatory Visit: Payer: Self-pay | Admitting: Nurse Practitioner

## 2019-11-19 DIAGNOSIS — I214 Non-ST elevation (NSTEMI) myocardial infarction: Secondary | ICD-10-CM

## 2019-12-10 ENCOUNTER — Other Ambulatory Visit: Payer: Self-pay | Admitting: Internal Medicine

## 2019-12-10 ENCOUNTER — Other Ambulatory Visit: Payer: Self-pay | Admitting: Nurse Practitioner

## 2019-12-10 DIAGNOSIS — I10 Essential (primary) hypertension: Secondary | ICD-10-CM

## 2019-12-10 DIAGNOSIS — Z720 Tobacco use: Secondary | ICD-10-CM

## 2019-12-10 MED FILL — ALBUTEROL SULFATE HFA 108 (: 108 (90 BAS | 16 days supply | Qty: 9 | Fill #2

## 2019-12-13 ENCOUNTER — Telehealth: Payer: Self-pay | Admitting: Nurse Practitioner

## 2019-12-13 NOTE — Telephone Encounter (Signed)
Pt called in and wanted to know why chantix, plavix, and anoprezol was denied a few weeks ago. Pt states Hydroclorothiazide is pending. Im unsure if this is insurance related or not.

## 2019-12-18 ENCOUNTER — Other Ambulatory Visit: Payer: Self-pay | Admitting: Nurse Practitioner

## 2019-12-18 DIAGNOSIS — Z720 Tobacco use: Secondary | ICD-10-CM

## 2019-12-18 DIAGNOSIS — I214 Non-ST elevation (NSTEMI) myocardial infarction: Secondary | ICD-10-CM

## 2019-12-18 DIAGNOSIS — I10 Essential (primary) hypertension: Secondary | ICD-10-CM

## 2019-12-18 MED ORDER — ATORVASTATIN CALCIUM 80 MG PO TABS: 80.0000 mg | ORAL_TABLET | Freq: Every day | ORAL | 1 refills | Status: DC

## 2019-12-18 MED ORDER — VARENICLINE TARTRATE 1 MG PO TABS: 1.0000 mg | ORAL_TABLET | Freq: Two times a day (BID) | ORAL | 0 refills | Status: DC

## 2019-12-18 MED ORDER — PANTOPRAZOLE SODIUM 40 MG PO TBEC: 40.0000 mg | DELAYED_RELEASE_TABLET | Freq: Every day | ORAL | 1 refills | Status: DC

## 2019-12-18 MED ORDER — CLOPIDOGREL BISULFATE 75 MG PO TABS: 75.0000 mg | ORAL_TABLET | Freq: Every day | ORAL | 1 refills | Status: DC

## 2019-12-18 MED FILL — HYDROCHLOROTHIAZIDE 12.5 MG: 12.5 | 30 days supply | Qty: 30 | Fill #0

## 2019-12-18 NOTE — Telephone Encounter (Signed)
Refills sent to [facility].

## 2019-12-18 NOTE — Telephone Encounter (Signed)
I based it on how he pronounced it. However after looking. I think he may have been referring to pantoprazole

## 2019-12-18 NOTE — Progress Notes (Signed)
   [place], [place]  [address] Phone:  [phone]   Fax:  [phone]  Refills sent to [facility]

## 2019-12-18 NOTE — Telephone Encounter (Signed)
Anoprezol ?

## 2019-12-22 ENCOUNTER — Other Ambulatory Visit: Payer: Self-pay | Admitting: Emergency Medicine

## 2019-12-26 MED FILL — HYDROCHLOROTHIAZIDE 12.5 MG: 12.5 | 30 days supply | Qty: 30 | Fill #0

## 2020-01-08 ENCOUNTER — Encounter: Payer: Self-pay | Admitting: Nurse Practitioner

## 2020-01-08 ENCOUNTER — Emergency Department (HOSPITAL_COMMUNITY): Payer: Medicare Other

## 2020-01-08 ENCOUNTER — Other Ambulatory Visit: Payer: Self-pay

## 2020-01-08 ENCOUNTER — Ambulatory Visit (INDEPENDENT_AMBULATORY_CARE_PROVIDER_SITE_OTHER): Payer: Medicare Other | Admitting: Nurse Practitioner

## 2020-01-08 ENCOUNTER — Other Ambulatory Visit: Payer: Self-pay | Admitting: Nurse Practitioner

## 2020-01-08 ENCOUNTER — Emergency Department (HOSPITAL_COMMUNITY)
Admission: EM | Admit: 2020-01-08 | Discharge: 2020-01-09 | Disposition: A | Discharge status: Home / Self Care | Payer: Medicare Other | Attending: Emergency Medicine | Admitting: Emergency Medicine

## 2020-01-08 VITALS — BP 126/78 | HR 77 | Temp 99.0°F | Resp 18 | Ht 72.0 in | Wt 201.0 lb

## 2020-01-08 DIAGNOSIS — R0600 Dyspnea, unspecified: Secondary | ICD-10-CM | POA: Diagnosis not present

## 2020-01-08 DIAGNOSIS — I214 Non-ST elevation (NSTEMI) myocardial infarction: Secondary | ICD-10-CM

## 2020-01-08 DIAGNOSIS — R0602 Shortness of breath: Secondary | ICD-10-CM | POA: Diagnosis not present

## 2020-01-08 DIAGNOSIS — I10 Essential (primary) hypertension: Secondary | ICD-10-CM | POA: Insufficient documentation

## 2020-01-08 DIAGNOSIS — R6889 Other general symptoms and signs: Secondary | ICD-10-CM | POA: Diagnosis not present

## 2020-01-08 DIAGNOSIS — K219 Gastro-esophageal reflux disease without esophagitis: Secondary | ICD-10-CM

## 2020-01-08 DIAGNOSIS — F17210 Nicotine dependence, cigarettes, uncomplicated: Secondary | ICD-10-CM | POA: Insufficient documentation

## 2020-01-08 DIAGNOSIS — Z79899 Other long term (current) drug therapy: Secondary | ICD-10-CM | POA: Insufficient documentation

## 2020-01-08 DIAGNOSIS — R7303 Prediabetes: Secondary | ICD-10-CM

## 2020-01-08 DIAGNOSIS — Z720 Tobacco use: Secondary | ICD-10-CM

## 2020-01-08 DIAGNOSIS — J441 Chronic obstructive pulmonary disease with (acute) exacerbation: Secondary | ICD-10-CM | POA: Insufficient documentation

## 2020-01-08 DIAGNOSIS — G479 Sleep disorder, unspecified: Secondary | ICD-10-CM

## 2020-01-08 DIAGNOSIS — I2510 Atherosclerotic heart disease of native coronary artery without angina pectoris: Secondary | ICD-10-CM | POA: Diagnosis not present

## 2020-01-08 DIAGNOSIS — E7800 Pure hypercholesterolemia, unspecified: Secondary | ICD-10-CM

## 2020-01-08 DIAGNOSIS — Z743 Need for continuous supervision: Secondary | ICD-10-CM | POA: Diagnosis not present

## 2020-01-08 DIAGNOSIS — J449 Chronic obstructive pulmonary disease, unspecified: Secondary | ICD-10-CM | POA: Diagnosis not present

## 2020-01-08 DIAGNOSIS — R0689 Other abnormalities of breathing: Secondary | ICD-10-CM | POA: Diagnosis not present

## 2020-01-08 DIAGNOSIS — Z9861 Coronary angioplasty status: Secondary | ICD-10-CM | POA: Diagnosis not present

## 2020-01-08 DIAGNOSIS — I499 Cardiac arrhythmia, unspecified: Secondary | ICD-10-CM | POA: Diagnosis not present

## 2020-01-08 DIAGNOSIS — Z20822 Contact with and (suspected) exposure to covid-19: Secondary | ICD-10-CM | POA: Insufficient documentation

## 2020-01-08 DIAGNOSIS — Z951 Presence of aortocoronary bypass graft: Secondary | ICD-10-CM | POA: Diagnosis not present

## 2020-01-08 LAB — POCT URINALYSIS DIPSTICK
Bilirubin, UA: NEGATIVE
Blood, UA: NEGATIVE
Glucose, UA: NEGATIVE
Ketones, UA: NEGATIVE
Leukocytes, UA: NEGATIVE
Nitrite, UA: NEGATIVE
Protein, UA: NEGATIVE
Spec Grav, UA: 1.02 (ref 1.010–1.025)
Urobilinogen, UA: 0.2 E.U./dL
pH, UA: 6 (ref 5.0–8.0)

## 2020-01-08 MED ORDER — PANTOPRAZOLE SODIUM 40 MG PO TBEC: 40.0000 mg | DELAYED_RELEASE_TABLET | Freq: Every day | ORAL | 11 refills | Status: DC

## 2020-01-08 MED ORDER — ALBUTEROL SULFATE HFA 108 (90 BASE) MCG/ACT IN AERS: 1.0000 | INHALATION_SPRAY | RESPIRATORY_TRACT | 11 refills | Status: DC | PRN

## 2020-01-08 MED ORDER — VARENICLINE TARTRATE 1 MG PO TABS: 1.0000 mg | ORAL_TABLET | Freq: Two times a day (BID) | ORAL | 3 refills | Status: DC

## 2020-01-08 MED FILL — ALBUTEROL SULFATE HFA 108 (: 108 (90 BAS | 25 days supply | Qty: 9 | Fill #0

## 2020-01-08 MED FILL — PANTOPRAZOLE SOD DR 40 MG T: 40 | 30 days supply | Qty: 30 | Fill #0

## 2020-01-08 NOTE — ED Provider Notes (Signed)
[facility] EMERGENCY DEPARTMENT Provider Note   CSN: [id number] Arrival date & time: 01/08/20  2303   History Chief Complaint  Patient presents with  . Shortness of Breath    [name] is a 64 y.o. male.  The history is provided by the patient and the EMS personnel.  Shortness of Breath He has history of hypertension, hyperlipidemia, coronary artery disease, COPD and comes in complaining of a COPD exacerbation.  He states that he was away from his home when he developed dyspnea.  He denies chest pain, heaviness, tightness, pressure.  He denies cough.  He denies fever or chills.  He did not have his inhaler with him and he was not able to get home to where he could use his nebulizer.  EMS reported oxygen saturation of 68% on room air and very little air movement.  They gave him methylprednisolone, magnesium sulfate, and a total of 10 mg albuterol via nebulizer.  Patient states that he feels much better.  Of note, he is continuing to smoke about 5 cigarettes a day.  He has received the COVID-19 vaccine.  He denies any sick contacts.  Of note, he has had hospitalizations for COPD but denies ever being intubated for COPD.  Past Medical History:  Diagnosis Date  . Anginal pain ([place])   . CAD (coronary artery disease)    4V CABG 10/31/13  . Cocaine abuse ([place])   . COPD (chronic obstructive pulmonary disease) ([place])   . GERD (gastroesophageal reflux disease)   . H/O: eczema    lower extremities  . H/O: GI bleed    a. 64 y/o ago, r/t ulcer. Denies recent GIB.  Marland Kitchen High cholesterol   . Hypertension   . Myocardial infarction ([place])   . Perforation of duodenal ulcer ([place])    a. 1970s - s/p surgery.   . Shortness of breath dyspnea   . Tobacco abuse     Patient Active Problem List   Diagnosis Date Noted  . Vitamin D deficiency 07/17/2019  . Iron deficiency anemia   . Elevated troponin 02/07/2019  . Hyperglycemia 02/07/2019  . CAD (coronary artery disease)   .  Prolonged QT interval   . Respiratory distress   . CVA (cerebral vascular accident) ([place]) 01/01/2018  . Vision loss of left eye 11/04/2017  . GIB (gastrointestinal bleeding) 11/04/2017  . Hypokalemia 11/04/2017  . Carotid artery stenosis 11/04/2017  . Tobacco dependence 05/30/2016  . Cocaine abuse ([place]) 07/02/2015  . Chronic obstructive pulmonary disease ([place])   . Essential hypertension   . Acute encephalopathy   . Cardiomyopathy, ischemic 09/04/2014  . Protein-calorie malnutrition, severe ([place]) 09/02/2014  . HTN (hypertension) 09/01/2014  . S/P CABG x 4 10/31/2013  . NSTEMI (non-ST elevated myocardial infarction) ([place]) 10/27/2013  . Pure hypercholesterolemia 10/27/2013  . Acute coronary syndrome ([place]) 10/26/2013  . Tobacco abuse 05/19/2011  . PUD (peptic ulcer disease) 05/18/2011    Past Surgical History:  Procedure Laterality Date  . BIOPSY  11/05/2017   Procedure: BIOPSY;  Surgeon: Wilford Corner, MD;  Location: [facility];  Service: Endoscopy;;  . COLONOSCOPY WITH PROPOFOL N/A 07/04/2019   Procedure: COLONOSCOPY WITH PROPOFOL;  Surgeon: Milus Banister, MD;  Location: [facility] ENDOSCOPY;  Service: Endoscopy;  Laterality: N/A;  . CORONARY ARTERY BYPASS GRAFT N/A 10/31/2013   Procedure: CORONARY ARTERY BYPASS GRAFTING TIMES FOUR ON PUMP USING LEFT INTERNAL MAMMARY ARTERY AND RIGHT GREATER SAPHENOUS VEIN VIA ENDOVEIN HARVEST.;  Surgeon: Melrose Nakayama, MD;  Location: [facility] OR;  Service: Open Heart Surgery;  Laterality: N/A;  . ENDARTERECTOMY Left 11/06/2017   Procedure: ENDARTERECTOMY CAROTID;  Surgeon: Conrad Millville, MD;  Location: [facility];  Service: Vascular;  Laterality: Left;  . ESOPHAGOGASTRODUODENOSCOPY (EGD) WITH PROPOFOL N/A 11/05/2017   Procedure: ESOPHAGOGASTRODUODENOSCOPY (EGD) WITH PROPOFOL;  Surgeon: Wilford Corner, MD;  Location: [facility];  Service: Endoscopy;  Laterality: N/A;  . ESOPHAGOGASTRODUODENOSCOPY (EGD) WITH PROPOFOL N/A 07/04/2019   Procedure:  ESOPHAGOGASTRODUODENOSCOPY (EGD) WITH PROPOFOL;  Surgeon: Milus Banister, MD;  Location: [facility];  Service: Endoscopy;  Laterality: N/A;  . INTRAOPERATIVE TRANSESOPHAGEAL ECHOCARDIOGRAM N/A 10/31/2013   Procedure: INTRAOPERATIVE TRANSESOPHAGEAL ECHOCARDIOGRAM;  Surgeon: Melrose Nakayama, MD;  Location: [facility];  Service: Open Heart Surgery;  Laterality: N/A;  . LEFT HEART CATHETERIZATION WITH CORONARY ANGIOGRAM N/A 10/28/2013   Procedure: LEFT HEART CATHETERIZATION WITH CORONARY ANGIOGRAM;  Surgeon: Burnell Blanks, MD;  Location: [facility] CATH LAB;  Service: Cardiovascular;  Laterality: N/A;  . POLYPECTOMY  07/04/2019   Procedure: POLYPECTOMY;  Surgeon: Milus Banister, MD;  Location: [facility];  Service: Endoscopy;;  . stomach sx         Family History  Problem Relation Age of Onset  . Lung cancer Mother   . Alzheimer's disease Mother   . Lung cancer Father   . HIV/AIDS Brother   . Obesity Brother   . Lung cancer Other     Social History   Tobacco Use  . Smoking status: Current Every Day Smoker    Packs/day: 0.25    Years: 40.00    Pack years: 10.00    Types: Cigarettes  . Smokeless tobacco: Never Used  . Tobacco comment: starting Chantix 01/04/18  Vaping Use  . Vaping Use: Never used  Substance Use Topics  . Alcohol use: Yes    Alcohol/week: 4.0 standard drinks    Types: 4 Standard drinks or equivalent per week    Comment: vodka  . Drug use: Yes    Types: Marijuana, Cocaine    Comment: "about 1 marijuana cigarette a week"    Home Medications Prior to Admission medications   Medication Sig Start Date End Date Taking? Authorizing Provider  albuterol (PROAIR HFA) 108 (90 Base) MCG/ACT inhaler Inhale 1-2 puffs into the lungs every 4 (four) hours as needed for wheezing or shortness of breath. 01/08/20 02/07/20  Vevelyn Francois, NP  albuterol (PROVENTIL) (2.5 MG/3ML) 0.083% nebulizer solution Take 3 mLs (2.5 mg total) by nebulization every 6 (six) hours as needed  for wheezing or shortness of breath. 10/18/19   Vevelyn Francois, NP  atorvastatin (LIPITOR) 80 MG tablet Take 1 tablet (80 mg total) by mouth daily at 6 PM. 12/18/19   Vevelyn Francois, NP  clopidogrel (PLAVIX) 75 MG tablet Take 1 tablet (75 mg total) by mouth daily. 12/18/19   Vevelyn Francois, NP  ferrous fumarate (HEMOCYTE - 106 MG FE) 325 (106 Fe) MG TABS tablet Take 1 tablet (106 mg of iron total) by mouth daily. 02/09/19   Geradine Girt, DO  fluticasone (FLONASE) 50 MCG/ACT nasal spray Place 2 sprays into both nostrils as needed for allergies or rhinitis. 10/09/19 01/08/20  Vevelyn Francois, NP  guaiFENesin (MUCINEX) 600 MG 12 hr tablet Take 600 mg by mouth 2 (two) times daily as needed (cough/congestion.).    [provider]  hydrochlorothiazide (MICROZIDE) 12.5 MG capsule TAKE 1 CAPSULE (12.5 MG TOTAL) BY MOUTH DAILY. 12/17/19   Vevelyn Francois, NP  montelukast (  SINGULAIR) 10 MG tablet TAKE 1 TABLET (10 MG TOTAL) BY MOUTH AT BEDTIME. 10/24/19   Vevelyn Francois, NP  Multiple Vitamin (MULTIVITAMIN WITH MINERALS) TABS tablet Take 1 tablet by mouth daily. 11/09/17   Caren Griffins, MD  pantoprazole (PROTONIX) 40 MG tablet Take 1 tablet (40 mg total) by mouth daily. 01/08/20 01/07/21  Vevelyn Francois, NP  Tiotropium Bromide-Olodaterol (STIOLTO RESPIMAT) 2.5-2.5 MCG/ACT AERS Inhale 2 puffs into the lungs daily. 10/02/19   Collene Gobble, MD  triamcinolone (KENALOG) 0.025 % ointment APPLY 1 APPLICATION TOPICALLY 2 TIMES DAILY. 09/26/19   Vevelyn Francois, NP  varenicline (CHANTIX) 1 MG tablet Take 1 tablet (1 mg total) by mouth 2 (two) times daily. 01/08/20   Vevelyn Francois, NP  vitamin B-12 (CYANOCOBALAMIN) 500 MCG tablet Take 1 tablet (500 mcg total) by mouth daily. 02/10/19   Geradine Girt, DO    Allergies    Patient has no known allergies.  Review of Systems   Review of Systems  Respiratory: Positive for shortness of breath.   All other systems reviewed and are negative.   Physical  Exam Updated Vital Signs SpO2 100%   Physical Exam Vitals and nursing note reviewed.   64 year old male, resting comfortably and in no acute distress.  He arrived wearing a nonrebreather mask and getting oxygen 100%.  Vital signs are significant for mildly elevated respiratory rate and borderline elevated heart rate. Oxygen saturation is 100%, which is normal. Head is normocephalic and atraumatic. PERRLA, EOMI. Oropharynx is clear. Neck is nontender and supple without adenopathy or JVD. Back is nontender and there is no CVA tenderness. Lungs have markedly diminished airflow with faint wheezes at the apices.  There are no rales or rhonchi. Chest is nontender. Heart has regular rate and rhythm without murmur. Abdomen is soft, flat, nontender without masses or hepatosplenomegaly and peristalsis is normoactive. 4 cm umbilical hernia present which is easily reducible. Extremities have 1+ edema, full range of motion is present. Skin is warm and dry without rash. Neurologic: Mental status is normal, cranial nerves are intact, there are no motor or sensory deficits.  ED Results / Procedures / Treatments   Labs (all labs ordered are listed, but only abnormal results are displayed) Labs Reviewed  BASIC METABOLIC PANEL - Abnormal; Notable for the following components:      Result Value   Glucose, Bld 115 (*)    Calcium 8.1 (*)    All other components within normal limits  I-STAT ARTERIAL BLOOD GAS, ED - Abnormal; Notable for the following components:   pH, Arterial 7.332 (*)    pCO2 arterial 54.1 (*)    pO2, Arterial 66 (*)    Bicarbonate 28.7 (*)    All other components within normal limits  CBC WITH DIFFERENTIAL/PLATELET  BRAIN NATRIURETIC PEPTIDE  POC SARS CORONAVIRUS 2 AG -  ED   Radiology DG Chest Port 1 View  Result Date: 01/08/2020 CLINICAL DATA:  Shortness of breath.  History of COPD. EXAM: PORTABLE CHEST 1 VIEW COMPARISON:  Radiograph 01/07/2019 FINDINGS: Post median sternotomy  and CABG. The lungs are hyperinflated with slight increase from prior exam. Increase in bronchial thickening. No focal airspace disease, pulmonary edema, pleural fluid or pneumothorax. Upper normal heart size unchanged. Stable mediastinal contours. No acute osseous abnormalities are seen. IMPRESSION: 1. Hyperinflation with increased bronchial thickening, imaging findings consistent with acute bronchitis/COPD exacerbation. 2. Post CABG.  No congestive failure. Electronically Signed   By: Keith Rake  M.D.   On: 01/08/2020 23:59    Procedures Procedures   Medications Ordered in ED Medications  ipratropium-albuterol (DUONEB) 0.5-2.5 (3) MG/3ML nebulizer solution 3 mL (3 mLs Nebulization Given 01/09/20 0144)    ED Course  I have reviewed the triage vital signs and the nursing notes.  Pertinent labs & imaging results that were available during my care of the patient were reviewed by me and considered in my medical decision making (see chart for details).  MDM Rules/Calculators/A&P COPD exacerbation.  He arrived on 100% oxygen and with oxygen saturations of 100%.  On review of old records, he has had some degree of CO2 retention in the past.  He is switched to oxygen at 2 L/min by nasal cannula and will check ABG.  Will need to check chest x-ray as well.  Given peripheral edema, will also check BNP.  Old records are reviewed confirming several hospitalizations for COPD.  Chest x-ray shows no evidence of pneumonia, no pulmonary vascular congestion. Blood gas did show some CO2 retention with PCO2 of 54 and pH of 7.33 (this was on arrival, before oxygen was decreased to 2 L/min). BNP is normal. CBC and metabolic panel are unremarkable. Patient was ambulated in the emergency department without any oxygen desaturation. He is discharged with prescription for prednisone, follow-up with his PCP in 5 days. Return precautions discussed.  Final Clinical Impression(s) / ED Diagnoses Final diagnoses:  COPD  exacerbation ([place])    Rx / DC Orders ED Discharge Orders         Ordered    predniSONE (DELTASONE) 50 MG tablet  Daily     Discontinue  Reprint     01/09/20 8916           Delora Fuel, MD 94/50/38 [phone]

## 2020-01-08 NOTE — Progress Notes (Signed)
[place], [place]  [address] Phone:  [phone]   Fax:  [phone]    Established Patient Office Visit  Subjective:  Patient ID: [name], male    DOB: [birth year]  Age: 64 y.o. MRN: [id number]  CC:  Chief Complaint  Patient presents with  . Hypertension  . Medication Refill    HPI [name] presents for follow up. He  has a past medical history of Anginal pain ([place]), CAD (coronary artery disease), Cocaine abuse ([place]), COPD (chronic obstructive pulmonary disease) ([place]), GERD (gastroesophageal reflux disease), H/O: eczema, H/O: GI bleed, High cholesterol, Hypertension, Myocardial infarction [facility]), Perforation of duodenal ulcer ([place]), Shortness of breath dyspnea, and Tobacco abuse.    Hypertension Patient is here for follow-up of elevated blood pressure. He is exercising and is adherent to a low-salt diet. Blood pressure is well controlled at home. Cardiac symptoms: none. Patient denies chest pain, claudication, dyspnea, exertional chest pressure/discomfort, fatigue, irregular heart beat, lower extremity edema, palpitations and syncope. Cardiovascular risk factors: advanced age (older than 45 for men, 35 for women), dyslipidemia, hypertension, male gender and smoking/ tobacco exposure. Use of agents associated with hypertension: none. History of target organ damage: angina/ prior myocardial infarction and stroke.  He is trying to quit smoking and admits that he had a problem with his chantix refill.  Past Medical History:  Diagnosis Date  . Anginal pain ([place])   . CAD (coronary artery disease)    4V CABG 10/31/13  . Cocaine abuse ([place])   . COPD (chronic obstructive pulmonary disease) ([place])   . GERD (gastroesophageal reflux disease)   . H/O: eczema    lower extremities  . H/O: GI bleed    a. 64 y/o ago, r/t ulcer. Denies recent GIB.  Marland Kitchen High cholesterol   . Hypertension   . Myocardial infarction ([place])   . Perforation of  duodenal ulcer ([place])    a. 1970s - s/p surgery.   . Shortness of breath dyspnea   . Tobacco abuse     Past Surgical History:  Procedure Laterality Date  . BIOPSY  11/05/2017   Procedure: BIOPSY;  Surgeon: Wilford Corner, MD;  Location: [place];  Service: Endoscopy;;  . COLONOSCOPY WITH PROPOFOL N/A 07/04/2019   Procedure: COLONOSCOPY WITH PROPOFOL;  Surgeon: Milus Banister, MD;  Location: [facility];  Service: Endoscopy;  Laterality: N/A;  . CORONARY ARTERY BYPASS GRAFT N/A 10/31/2013   Procedure: CORONARY ARTERY BYPASS GRAFTING TIMES FOUR ON PUMP USING LEFT INTERNAL MAMMARY ARTERY AND RIGHT GREATER SAPHENOUS VEIN VIA ENDOVEIN HARVEST.;  Surgeon: Melrose Nakayama, MD;  Location: [place];  Service: Open Heart Surgery;  Laterality: N/A;  . ENDARTERECTOMY Left 11/06/2017   Procedure: ENDARTERECTOMY CAROTID;  Surgeon: Conrad Pecan Hill, MD;  Location: [place];  Service: Vascular;  Laterality: Left;  . ESOPHAGOGASTRODUODENOSCOPY (EGD) WITH PROPOFOL N/A 11/05/2017   Procedure: ESOPHAGOGASTRODUODENOSCOPY (EGD) WITH PROPOFOL;  Surgeon: Wilford Corner, MD;  Location: [place];  Service: Endoscopy;  Laterality: N/A;  . ESOPHAGOGASTRODUODENOSCOPY (EGD) WITH PROPOFOL N/A 07/04/2019   Procedure: ESOPHAGOGASTRODUODENOSCOPY (EGD) WITH PROPOFOL;  Surgeon: Milus Banister, MD;  [facility];  Service: Endoscopy;  Laterality: N/A;  . INTRAOPERATIVE TRANSESOPHAGEAL ECHOCARDIOGRAM N/A 10/31/2013   Procedure: INTRAOPERATIVE TRANSESOPHAGEAL ECHOCARDIOGRAM;  Surgeon: Melrose Nakayama, MD;  Location: [place];  Service: Open Heart Surgery;  Laterality: N/A;  . LEFT HEART CATHETERIZATION WITH CORONARY ANGIOGRAM N/A 10/28/2013   Procedure: LEFT HEART CATHETERIZATION WITH CORONARY  ANGIOGRAM;  Surgeon: Burnell Blanks, MD;  Location: [facility] CATH LAB;  Service: Cardiovascular;  Laterality: N/A;  . POLYPECTOMY  07/04/2019   Procedure: POLYPECTOMY;  Surgeon: Milus Banister, MD;  [facility];  Service: Endoscopy;;  . stomach sx      Family History  Problem Relation Age of Onset  . Lung cancer Mother   . Alzheimer's disease Mother   . Lung cancer Father   . HIV/AIDS Brother   . Obesity Brother   . Lung cancer Other     Social History   Socioeconomic History  . Marital status: Single    Spouse name: Not on file  . Number of children: 1  . Years of education: Not on file  . Highest education level: Bachelor's degree (e.g., BA, AB, BS)  Occupational History  . Occupation: retired  Tobacco Use  . Smoking status: Current Every Day Smoker    Packs/day: 0.25    Years: 40.00    Pack years: 10.00    Types: Cigarettes  . Smokeless tobacco: Never Used  . Tobacco comment: starting Chantix 01/04/18  Vaping Use  . Vaping Use: Never used  Substance and Sexual Activity  . Alcohol use: Yes    Alcohol/week: 4.0 standard drinks    Types: 4 Standard drinks or equivalent per week    Comment: vodka  . Drug use: Yes    Types: Marijuana, Cocaine    Comment: "about 1 marijuana cigarette a week"  . Sexual activity: Yes    Birth control/protection: None  Other Topics Concern  . Not on file  Social History Narrative   Pt is right-handed. He lives alone in a one story house. He drinks 2 cups coffee a day, and a liter of ginger-ale. He walks 4 x a week.   Social Determinants of Health   Financial Resource Strain:   . Difficulty of Paying Living Expenses:   Food Insecurity:   . Worried About Charity fundraiser in the Last Year:   . Arboriculturist in the Last Year:   Transportation Needs:   . Film/video editor (Medical):   Marland Kitchen Lack of Transportation (Non-Medical):   Physical Activity:   . Days of Exercise per Week:   . Minutes of Exercise per Session:   Stress:   . Feeling of Stress :   Social Connections:   . Frequency of Communication with Friends and Family:   . Frequency of Social Gatherings with Friends and Family:   . Attends Religious Services:   .  Active Member of Clubs or Organizations:   . Attends Archivist Meetings:   Marland Kitchen Marital Status:   Intimate Partner Violence:   . Fear of Current or Ex-Partner:   . Emotionally Abused:   Marland Kitchen Physically Abused:   . Sexually Abused:     Outpatient Medications Prior to Visit  Medication Sig Dispense Refill  . albuterol (PROVENTIL) (2.5 MG/3ML) 0.083% nebulizer solution Take 3 mLs (2.5 mg total) by nebulization every 6 (six) hours as needed for wheezing or shortness of breath. 150 mL 1  . atorvastatin (LIPITOR) 80 MG tablet Take 1 tablet (80 mg total) by mouth daily at 6 PM. 90 tablet 1  . clopidogrel (PLAVIX) 75 MG tablet Take 1 tablet (75 mg total) by mouth daily. 90 tablet 1  . ferrous fumarate (HEMOCYTE - 106 MG FE) 325 (106 Fe) MG TABS tablet Take 1 tablet (106 mg of iron total) by mouth daily. [place]  tablet 0  . fluticasone (FLONASE) 50 MCG/ACT nasal spray Place 2 sprays into both nostrils as needed for allergies or rhinitis. 10 mL 2  . guaiFENesin (MUCINEX) 600 MG 12 hr tablet Take 600 mg by mouth 2 (two) times daily as needed (cough/congestion.).    Marland Kitchen hydrochlorothiazide (MICROZIDE) 12.5 MG capsule TAKE 1 CAPSULE (12.5 MG TOTAL) BY MOUTH DAILY. 30 capsule 1  . montelukast (SINGULAIR) 10 MG tablet TAKE 1 TABLET (10 MG TOTAL) BY MOUTH AT BEDTIME. 30 tablet 3  . Multiple Vitamin (MULTIVITAMIN WITH MINERALS) TABS tablet Take 1 tablet by mouth daily. 30 tablet 1  . Tiotropium Bromide-Olodaterol (STIOLTO RESPIMAT) 2.5-2.5 MCG/ACT AERS Inhale 2 puffs into the lungs daily. 12 g 0  . vitamin B-12 (CYANOCOBALAMIN) 500 MCG tablet Take 1 tablet (500 mcg total) by mouth daily. 30 tablet 0  . albuterol (PROAIR HFA) 108 (90 Base) MCG/ACT inhaler Inhale 1-2 puffs into the lungs every 4 (four) hours as needed for wheezing or shortness of breath. 6.7 g 2  . pantoprazole (PROTONIX) 40 MG tablet Take 1 tablet (40 mg total) by mouth daily. 90 tablet 1  . triamcinolone (KENALOG) 0.025 % ointment APPLY 1  APPLICATION TOPICALLY 2 TIMES DAILY. (Patient taking differently: Apply 1 application topically 2 (two) times daily. ) 30 g 2  . varenicline (CHANTIX CONTINUING MONTH PAK) 1 MG tablet Take 1 tablet (1 mg total) by mouth 2 (two) times daily. T (Patient not taking: Reported on 01/08/2020) 180 tablet 0   No facility-administered medications prior to visit.    No Known Allergies  ROS Review of Systems    Objective:    Physical Exam Constitutional:      General: He is not in acute distress.    Appearance: He is not ill-appearing or toxic-appearing.  HENT:     Head: Normocephalic and atraumatic.     Nose: Nose normal.     Mouth/Throat:     Mouth: Mucous membranes are moist.  Cardiovascular:     Rate and Rhythm: Normal rate and regular rhythm.     Pulses: Normal pulses.     Heart sounds: Normal heart sounds.  Pulmonary:     Effort: Pulmonary effort is normal.     Comments: Audible but diminished  Abdominal:     General: Bowel sounds are normal.     Palpations: Abdomen is soft.  Musculoskeletal:        General: Normal range of motion.     Cervical back: Normal range of motion.  Skin:    General: Skin is warm and dry.     Capillary Refill: Capillary refill takes less than 2 seconds.  Neurological:     General: No focal deficit present.     Mental Status: He is alert and oriented to person, place, and time.  Psychiatric:        Mood and Affect: Mood normal.        Behavior: Behavior normal.        Thought Content: Thought content normal.        Judgment: Judgment normal.     BP 126/78 (BP Location: Right Arm, Patient Position: Sitting, Cuff Size: Normal)   Pulse 77   Temp 99 F (37.2 C) (Oral)   Resp 18   Ht 6' (1.829 m)   Wt 201 lb (91.2 kg)   SpO2 98%   BMI 27.26 kg/m  Wt Readings from Last 3 Encounters:  01/08/20 201 lb (91.2 kg)  10/09/19 205 lb 9.6 oz (  93.3 kg)  07/10/19 217 lb (98.4 kg)     Health Maintenance Due  Topic Date Due  . PNA vac Low Risk  Adult (2 of 2 - PPSV23) 11/07/2019    There are no preventive care reminders to display for this patient.  Lab Results  Component Value Date   TSH 0.478 11/05/2017   Lab Results  Component Value Date   WBC 8.1 01/08/2020   HGB 14.3 01/09/2020   HCT 42.0 01/09/2020   MCV 93.8 01/08/2020   PLT 226 01/08/2020   Lab Results  Component Value Date   NA 139 01/09/2020   K 3.8 01/09/2020   CO2 23 01/08/2020   GLUCOSE 115 (H) 01/08/2020   BUN 9 01/08/2020   CREATININE 1.05 01/08/2020   BILITOT 0.5 01/08/2020   ALKPHOS 129 (H) 01/08/2020   AST 19 01/08/2020   ALT 22 02/21/2019   PROT 6.2 01/08/2020   ALBUMIN 4.0 01/08/2020   CALCIUM 8.1 (L) 01/08/2020   ANIONGAP 9 01/08/2020   GFR 82.90 04/30/2019   Lab Results  Component Value Date   CHOL 133 01/08/2020   Lab Results  Component Value Date   HDL 63 01/08/2020   Lab Results  Component Value Date   LDLCALC 57 01/08/2020   Lab Results  Component Value Date   TRIG 61 01/08/2020   Lab Results  Component Value Date   CHOLHDL 2.1 01/08/2020   Lab Results  Component Value Date   HGBA1C 6.0 (H) 02/07/2019      Assessment & Plan:   Problem List Items Addressed This Visit      Cardiovascular and Mediastinum   Essential hypertension - Primary Encourage home monitoring and recording BP <130/80 Eating a heart-healthy diet with less salt Encouraged regular physical activity  Recommend weight loss Encouraged on going compliance with current medication regimen    Relevant Orders   Urinalysis Dipstick (Completed)   Ambulatory referral to Cardiology   Lipid panel (Completed)   Comp. Metabolic Panel (12) (Completed)   NSTEMI (non-ST elevated myocardial infarction) ([place])   Relevant Orders   Ambulatory referral to Cardiology     Respiratory   Chronic obstructive pulmonary disease (HCC)   Relevant Medications   varenicline (CHANTIX) 1 MG tablet   albuterol (PROAIR HFA) 108 (90 Base) MCG/ACT inhaler     Other    Pure hypercholesterolemia   Tobacco abuse (Chronic) Discussed the risk factors associated with smoking ; CAD, COPD, Cancer, PVD increased susceptibility to respiratory illnesses Discussed treatment options with cessation ie counseling, support resources and available medications  Choosing a quit day and setting goals accordingly. Discussed ways to quit; start by decreasing one cigarette per day or per week.  Refill on Chantix 1mg  BID  Discussed potential side effects.  Encourage patient to continue to good work Provided education handouts   Counseling 5-10 minutes     Other Visit Diagnoses    Sleep disturbance       Relevant Orders   Split night study   Gastroesophageal reflux disease without esophagitis       Relevant Medications   pantoprazole (PROTONIX) 40 MG tablet      Meds ordered this encounter  Medications  . varenicline (CHANTIX) 1 MG tablet    Sig: Take 1 tablet (1 mg total) by mouth 2 (two) times daily.    Dispense:  60 tablet    Refill:  3    Order Specific Question:   Supervising Provider    Answer:  JEGEDE, OLUGBEMIGA E [id number]  . pantoprazole (PROTONIX) 40 MG tablet    Sig: Take 1 tablet (40 mg total) by mouth daily.    Dispense:  30 tablet    Refill:  11    Order Specific Question:   Supervising Provider    Answer:   Tresa Garter [id number]  . albuterol (PROAIR HFA) 108 (90 Base) MCG/ACT inhaler    Sig: Inhale 1-2 puffs into the lungs every 4 (four) hours as needed for wheezing or shortness of breath.    Dispense:  6.7 g    Refill:  11    Order Specific Question:   Supervising Provider    Answer:   Tresa Garter [[id number]]    Follow-up: Return in about 3 months (around 04/09/2020).    Vevelyn Francois, NP

## 2020-01-08 NOTE — Patient Instructions (Signed)
Steps to Quit Smoking Smoking tobacco is the leading cause of preventable death. It can affect almost every organ in the body. Smoking puts you and people around you at risk for many serious, long-lasting (chronic) diseases. Quitting smoking can be hard, but it is one of the best things that you can do for your health. It is never too late to quit. How do I get ready to quit? When you decide to quit smoking, make a plan to help you succeed. Before you quit:  Pick a date to quit. Set a date within the next 2 weeks to give you time to prepare.  Write down the reasons why you are quitting. Keep this list in places where you will see it often.  Tell your family, friends, and co-workers that you are quitting. Their support is important.  Talk with your doctor about the choices that may help you quit.  Find out if your health insurance will pay for these treatments.  Know the people, places, things, and activities that make you want to smoke (triggers). Avoid them. What first steps can I take to quit smoking?  Throw away all cigarettes at home, at work, and in your car.  Throw away the things that you use when you smoke, such as ashtrays and lighters.  Clean your car. Make sure to empty the ashtray.  Clean your home, including curtains and carpets. What can I do to help me quit smoking? Talk with your doctor about taking medicines and seeing a counselor at the same time. You are more likely to succeed when you do both.  If you are pregnant or breastfeeding, talk with your doctor about counseling or other ways to quit smoking. Do not take medicine to help you quit smoking unless your doctor tells you to do so. To quit smoking: Quit right away  Quit smoking totally, instead of slowly cutting back on how much you smoke over a period of time.  Go to counseling. You are more likely to quit if you go to counseling sessions regularly. Take medicine You may take medicines to help you quit. Some  medicines need a prescription, and some you can buy over-the-counter. Some medicines may contain a drug called nicotine to replace the nicotine in cigarettes. Medicines may:  Help you to stop having the desire to smoke (cravings).  Help to stop the problems that come when you stop smoking (withdrawal symptoms). Your doctor may ask you to use:  Nicotine patches, gum, or lozenges.  Nicotine inhalers or sprays.  Non-nicotine medicine that is taken by mouth. Find resources Find resources and other ways to help you quit smoking and remain smoke-free after you quit. These resources are most helpful when you use them often. They include:  Online chats with a counselor.  Phone quitlines.  Printed self-help materials.  Support groups or group counseling.  Text messaging programs.  Mobile phone apps. Use apps on your mobile phone or tablet that can help you stick to your quit plan. There are many free apps for mobile phones and tablets as well as websites. Examples include Quit Guide from the CDC and smokefree.gov  What things can I do to make it easier to quit?   Talk to your family and friends. Ask them to support and encourage you.  Call a phone quitline (1-800-QUIT-NOW), reach out to support groups, or work with a counselor.  Ask people who smoke to not smoke around you.  Avoid places that make you want to smoke,   such as: ? Bars. ? Parties. ? Smoke-break areas at work.  Spend time with people who do not smoke.  Lower the stress in your life. Stress can make you want to smoke. Try these things to help your stress: ? Getting regular exercise. ? Doing deep-breathing exercises. ? Doing yoga. ? Meditating. ? Doing a body scan. To do this, close your eyes, focus on one area of your body at a time from head to toe. Notice which parts of your body are tense. Try to relax the muscles in those areas. How will I feel when I quit smoking? Day 1 to 3 weeks Within the first 24 hours,  you may start to have some problems that come from quitting tobacco. These problems are very bad 2-3 days after you quit, but they do not often last for more than 2-3 weeks. You may get these symptoms:  Mood swings.  Feeling restless, nervous, angry, or annoyed.  Trouble concentrating.  Dizziness.  Strong desire for high-sugar foods and nicotine.  Weight gain.  Trouble pooping (constipation).  Feeling like you may vomit (nausea).  Coughing or a sore throat.  Changes in how the medicines that you take for other issues work in your body.  Depression.  Trouble sleeping (insomnia). Week 3 and afterward After the first 2-3 weeks of quitting, you may start to notice more positive results, such as:  Better sense of smell and taste.  Less coughing and sore throat.  Slower heart rate.  Lower blood pressure.  Clearer skin.  Better breathing.  Fewer sick days. Quitting smoking can be hard. Do not give up if you fail the first time. Some people need to try a few times before they succeed. Do your best to stick to your quit plan, and talk with your doctor if you have any questions or concerns. Summary  Smoking tobacco is the leading cause of preventable death. Quitting smoking can be hard, but it is one of the best things that you can do for your health.  When you decide to quit smoking, make a plan to help you succeed.  Quit smoking right away, not slowly over a period of time.  When you start quitting, seek help from your doctor, family, or friends. This information is not intended to replace advice given to you by your health care provider. Make sure you discuss any questions you have with your health care provider. Document Revised: 02/22/2019 Document Reviewed: 08/18/2018 Elsevier Patient Education  [place]. Nonspecific Chest Pain Chest pain can be caused by many different conditions. Some causes of chest pain can be life-threatening. These will require  treatment right away. Serious causes of chest pain include:  Heart attack.  A tear in the body's main blood vessel.  Redness and swelling (inflammation) around your heart.  Blood clot in your lungs. Other causes of chest pain may not be so serious. These include:  Heartburn.  Anxiety or stress.  Damage to bones or muscles in your chest.  Lung infections. Chest pain can feel like:  Pain or discomfort in your chest.  Crushing, pressure, aching, or squeezing pain.  Burning or tingling.  Dull or sharp pain that is worse when you move, cough, or take a deep breath.  Pain or discomfort that is also felt in your back, neck, jaw, shoulder, or arm, or pain that spreads to any of these areas. It is hard to know whether your pain is caused by something that is serious or something that  is not so serious. So it is important to see your doctor right away if you have chest pain. Follow these instructions at home: Medicines  Take over-the-counter and prescription medicines only as told by your doctor.  If you were prescribed an antibiotic medicine, take it as told by your doctor. Do not stop taking the antibiotic even if you start to feel better. Lifestyle   Rest as told by your doctor.  Do not use any products that contain nicotine or tobacco, such as cigarettes, e-cigarettes, and chewing tobacco. If you need help quitting, ask your doctor.  Do not drink alcohol.  Make lifestyle changes as told by your doctor. These may include: ? Getting regular exercise. Ask your doctor what activities are safe for you. ? Eating a heart-healthy diet. A diet and nutrition specialist (dietitian) can help you to learn healthy eating options. ? Staying at a healthy weight. ? Treating diabetes or high blood pressure, if needed. ? Lowering your stress. Activities such as yoga and relaxation techniques can help. General instructions  Pay attention to any changes in your symptoms. Tell your doctor  about them or any new symptoms.  Avoid any activities that cause chest pain.  Keep all follow-up visits as told by your doctor. This is important. You may need more testing if your chest pain does not go away. Contact a doctor if:  Your chest pain does not go away.  You feel depressed.  You have a fever. Get help right away if:  Your chest pain is worse.  You have a cough that gets worse, or you cough up blood.  You have very bad (severe) pain in your belly (abdomen).  You pass out (faint).  You have either of these for no clear reason: ? Sudden chest discomfort. ? Sudden discomfort in your arms, back, neck, or jaw.  You have shortness of breath at any time.  You suddenly start to sweat, or your skin gets clammy.  You feel sick to your stomach (nauseous).  You throw up (vomit).  You suddenly feel lightheaded or dizzy.  You feel very weak or tired.  Your heart starts to beat fast, or it feels like it is skipping beats. These symptoms may be an emergency. Do not wait to see if the symptoms will go away. Get medical help right away. Call your local emergency services (911 in the [place] Do not drive yourself to the hospital. Summary  Chest pain can be caused by many different conditions. The cause may be serious and need treatment right away. If you have chest pain, see your doctor right away.  Follow your doctor's instructions for taking medicines and making lifestyle changes.  Keep all follow-up visits as told by your doctor. This includes visits for any further testing if your chest pain does not go away.  Be sure to know the signs that show that your condition has become worse. Get help right away if you have these symptoms. This information is not intended to replace advice given to you by your health care provider. Make sure you discuss any questions you have with your health care provider. Document Revised: 11/30/2017 Document Reviewed: 11/30/2017 Elsevier  Patient Education  2020 [employer].

## 2020-01-08 NOTE — ED Triage Notes (Signed)
Patient arrived [facility] from home for SOB. EMS administered 5mg  albuterol X2, 125mg  solumedrol, 2g mag sulfate.   Spo2 68% RA Spo2 on oxygen RR 20 HR 107 BP 144/90

## 2020-01-09 ENCOUNTER — Encounter (HOSPITAL_COMMUNITY): Payer: Self-pay

## 2020-01-09 LAB — CBC WITH DIFFERENTIAL/PLATELET
Abs Immature Granulocytes: 0.03 10*3/uL (ref 0.00–0.07)
Basophils Absolute: 0 10*3/uL (ref 0.0–0.1)
Basophils Relative: 1 %
Eosinophils Absolute: 0.2 10*3/uL (ref 0.0–0.5)
Eosinophils Relative: 2 %
HCT: 42.1 % (ref 39.0–52.0)
Hemoglobin: 13.5 g/dL (ref 13.0–17.0)
Immature Granulocytes: 0 %
Lymphocytes Relative: 11 %
Lymphs Abs: 0.9 10*3/uL (ref 0.7–4.0)
MCH: 30.1 pg (ref 26.0–34.0)
MCHC: 32.1 g/dL (ref 30.0–36.0)
MCV: 93.8 fL (ref 80.0–100.0)
Monocytes Absolute: 0.4 10*3/uL (ref 0.1–1.0)
Monocytes Relative: 4 %
Neutro Abs: 6.7 10*3/uL (ref 1.7–7.7)
Neutrophils Relative %: 82 %
Platelets: 226 10*3/uL (ref 150–400)
RBC: 4.49 MIL/uL (ref 4.22–5.81)
RDW: 14.6 % (ref 11.5–15.5)
WBC: 8.1 10*3/uL (ref 4.0–10.5)
nRBC: 0 % (ref 0.0–0.2)

## 2020-01-09 LAB — BASIC METABOLIC PANEL
Anion gap: 9 (ref 5–15)
BUN: 9 mg/dL (ref 8–23)
CO2: 23 mmol/L (ref 22–32)
Calcium: 8.1 mg/dL — ABNORMAL LOW (ref 8.9–10.3)
Chloride: 105 mmol/L (ref 98–111)
Creatinine, Ser: 1.05 mg/dL (ref 0.61–1.24)
GFR calc Af Amer: >60 mL/min (ref >60)
GFR calc non Af Amer: >60 mL/min (ref >60)
Glucose, Bld: 115 mg/dL — ABNORMAL HIGH (ref 70–99)
Potassium: 4.1 mmol/L (ref 3.5–5.1)
Sodium: 137 mmol/L (ref 135–145)

## 2020-01-09 LAB — COMP. METABOLIC PANEL (12)
AST: 19 IU/L (ref 0–40)
Albumin/Globulin Ratio: 1.8 (ref 1.2–2.2)
Albumin: 4 g/dL (ref 3.8–4.8)
Alkaline Phosphatase: 129 IU/L — ABNORMAL HIGH (ref 48–121)
BUN/Creatinine Ratio: 8 — ABNORMAL LOW (ref 10–24)
BUN: 9 mg/dL (ref 8–27)
Bilirubin Total: 0.5 mg/dL (ref 0.0–1.2)
Calcium: 9 mg/dL (ref 8.6–10.2)
Chloride: 104 mmol/L (ref 96–106)
Creatinine, Ser: 1.08 mg/dL (ref 0.76–1.27)
GFR calc Af Amer: 82 mL/min/{1.73_m2} (ref >59)
GFR calc non Af Amer: 71 mL/min/{1.73_m2} (ref >59)
Globulin, Total: 2.2 g/dL (ref 1.5–4.5)
Glucose: 98 mg/dL (ref 65–99)
Potassium: 3.8 mmol/L (ref 3.5–5.2)
Sodium: 142 mmol/L (ref 134–144)
Total Protein: 6.2 g/dL (ref 6.0–8.5)

## 2020-01-09 LAB — LIPID PANEL
Chol/HDL Ratio: 2.1 ratio (ref 0.0–5.0)
Cholesterol, Total: 133 mg/dL (ref 100–199)
HDL: 63 mg/dL (ref >39)
LDL Chol Calc (NIH): 57 mg/dL (ref 0–99)
Triglycerides: 61 mg/dL (ref 0–149)
VLDL Cholesterol Cal: 13 mg/dL (ref 5–40)

## 2020-01-09 LAB — I-STAT ARTERIAL BLOOD GAS, ED
Acid-Base Excess: 2 mmol/L (ref 0.0–2.0)
Bicarbonate: 28.7 mmol/L — ABNORMAL HIGH (ref 20.0–28.0)
Calcium, Ion: 1.24 mmol/L (ref 1.15–1.40)
HCT: 42 % (ref 39.0–52.0)
Hemoglobin: 14.3 g/dL (ref 13.0–17.0)
O2 Saturation: 91 %
Patient temperature: 98.1
Potassium: 3.8 mmol/L (ref 3.5–5.1)
Sodium: 139 mmol/L (ref 135–145)
TCO2: 30 mmol/L (ref 22–32)
pCO2 arterial: 54.1 mmHg — ABNORMAL HIGH (ref 32.0–48.0)
pH, Arterial: 7.332 — ABNORMAL LOW (ref 7.350–7.450)
pO2, Arterial: 66 mmHg — ABNORMAL LOW (ref 83.0–108.0)

## 2020-01-09 LAB — POC SARS CORONAVIRUS 2 AG -  ED: SARS Coronavirus 2 Ag: NEGATIVE

## 2020-01-09 LAB — BRAIN NATRIURETIC PEPTIDE: B Natriuretic Peptide: 78.8 pg/mL (ref 0.0–100.0)

## 2020-01-09 MED ORDER — IPRATROPIUM-ALBUTEROL 0.5-2.5 (3) MG/3ML IN SOLN
3.0000 mL | Freq: Once | RESPIRATORY_TRACT | Status: AC
  Administered 2020-01-09: 3 mL via RESPIRATORY_TRACT
  Filled 2020-01-09: qty 3

## 2020-01-09 MED ORDER — PREDNISONE 50 MG PO TABS
50.0000 mg | ORAL_TABLET | Freq: Every day | ORAL | 0 refills | Status: DC
Start: 2020-01-09 — End: 2020-03-02

## 2020-01-09 MED FILL — predniSONE 10 MG TABS: 10 | 5 days supply | Qty: 25 | Fill #0

## 2020-01-09 NOTE — Discharge Instructions (Addendum)
Continue to use your inhaler and nebulizer as needed.  Return if you are having any problems.  Do all you can to stop smoking.

## 2020-01-09 NOTE — ED Notes (Signed)
Patient lying spo2 low 90's on RA, denies SOB. While ambulating spo2 93-97% on RA, denies SOB

## 2020-01-10 ENCOUNTER — Other Ambulatory Visit: Payer: Self-pay | Admitting: Nurse Practitioner

## 2020-01-10 DIAGNOSIS — L309 Dermatitis, unspecified: Secondary | ICD-10-CM

## 2020-01-10 MED FILL — STIOLTO RESPIMAT INHAL SPRY: 2.5-2.5 | 30 days supply | Qty: 4 | Fill #4

## 2020-01-10 MED FILL — TRIAMCINOLONE 0.025% OINT: 0.025 | 15 days supply | Qty: 30 | Fill #0

## 2020-01-10 MED FILL — FLUTICASONE PROP 50 MCG SPR: 50 | 30 days supply | Qty: 16 | Fill #1

## 2020-02-03 MED FILL — PANTOPRAZOLE SOD DR 40 MG T: 40 | 30 days supply | Qty: 30 | Fill #1

## 2020-02-03 MED FILL — ALBUTEROL SULFATE HFA 108 (: 108 (90 BAS | 25 days supply | Qty: 9 | Fill #1

## 2020-02-03 MED FILL — HYDROCHLOROTHIAZIDE 12.5 MG: 12.5 | 30 days supply | Qty: 30 | Fill #1

## 2020-02-03 MED FILL — MONTELUKAST SOD 10 MG TAB: 10 | 90 days supply | Qty: 90 | Fill #1

## 2020-02-03 MED FILL — ATORVASTATIN 80 MG TABLET: 80 | 90 days supply | Qty: 90 | Fill #2

## 2020-02-06 ENCOUNTER — Other Ambulatory Visit: Payer: Self-pay | Admitting: Nurse Practitioner

## 2020-02-06 DIAGNOSIS — I214 Non-ST elevation (NSTEMI) myocardial infarction: Secondary | ICD-10-CM

## 2020-02-06 MED FILL — FLUTICASONE PROP 50 MCG SPR: 50 | 30 days supply | Qty: 16 | Fill #2

## 2020-02-06 MED FILL — TRIAMCINOLONE 0.025% OINT: 0.025 | 15 days supply | Qty: 30 | Fill #1

## 2020-02-12 ENCOUNTER — Other Ambulatory Visit: Payer: Self-pay | Admitting: Nurse Practitioner

## 2020-02-13 MED FILL — CLOPIDOGREL 75 MG TABLET: 75 | 30 days supply | Qty: 30 | Fill #0

## 2020-02-19 ENCOUNTER — Other Ambulatory Visit: Payer: Self-pay

## 2020-02-19 ENCOUNTER — Encounter (HOSPITAL_BASED_OUTPATIENT_CLINIC_OR_DEPARTMENT_OTHER): Payer: Self-pay | Admitting: Internal Medicine

## 2020-02-19 ENCOUNTER — Ambulatory Visit (HOSPITAL_BASED_OUTPATIENT_CLINIC_OR_DEPARTMENT_OTHER): Payer: Medicare Other | Attending: Nurse Practitioner | Admitting: Internal Medicine

## 2020-02-19 VITALS — Ht 72.0 in | Wt 200.0 lb

## 2020-02-19 DIAGNOSIS — R0902 Hypoxemia: Secondary | ICD-10-CM | POA: Insufficient documentation

## 2020-02-19 DIAGNOSIS — I10 Essential (primary) hypertension: Secondary | ICD-10-CM | POA: Insufficient documentation

## 2020-02-19 DIAGNOSIS — I493 Ventricular premature depolarization: Secondary | ICD-10-CM | POA: Insufficient documentation

## 2020-02-19 DIAGNOSIS — G4733 Obstructive sleep apnea (adult) (pediatric): Secondary | ICD-10-CM | POA: Insufficient documentation

## 2020-02-19 DIAGNOSIS — J449 Chronic obstructive pulmonary disease, unspecified: Secondary | ICD-10-CM | POA: Diagnosis not present

## 2020-02-19 DIAGNOSIS — G479 Sleep disorder, unspecified: Secondary | ICD-10-CM

## 2020-02-19 HISTORY — DX: Sleep disorder, unspecified: G47.9

## 2020-02-19 MED FILL — ALBUTEROL SUL 2.5 MG/3 ML S: (2.5 MG/3ML | 24 days supply | Qty: 90 | Fill #1

## 2020-02-20 MED FILL — CLOPIDOGREL 75 MG TABLET: 75 | 30 days supply | Qty: 30 | Fill #0

## 2020-02-29 DIAGNOSIS — G4729 Other circadian rhythm sleep disorder: Secondary | ICD-10-CM | POA: Diagnosis not present

## 2020-02-29 DIAGNOSIS — G4733 Obstructive sleep apnea (adult) (pediatric): Secondary | ICD-10-CM | POA: Diagnosis not present

## 2020-02-29 DIAGNOSIS — G4736 Sleep related hypoventilation in conditions classified elsewhere: Secondary | ICD-10-CM

## 2020-02-29 DIAGNOSIS — G479 Sleep disorder, unspecified: Secondary | ICD-10-CM | POA: Diagnosis not present

## 2020-02-29 NOTE — Procedures (Signed)
   Patient Name: [name], [name] Date: 02/19/2020 Gender: Male D.O.B: [birth year] Age (years): 66 Referring Provider: Dionisio David NP Height (inches): 72 Interpreting Physician: Baird Lyons MD, ABSM Weight (lbs): 200 RPSGT: Carolin Coy BMI: 27 MRN: [id number] Neck Size: 16.00  CLINICAL INFORMATION Sleep Study Type: NPSG Indication for sleep study: COPD, Hypertension Epworth Sleepiness Score: 9  SLEEP STUDY TECHNIQUE As per the AASM Manual for the Scoring of Sleep and Associated Events v2.3 (April 2016) with a hypopnea requiring 4% desaturations.  The channels recorded and monitored were frontal, central and occipital EEG, electrooculogram (EOG), submentalis EMG (chin), nasal and oral airflow, thoracic and abdominal wall motion, anterior tibialis EMG, snore microphone, electrocardiogram, and pulse oximetry.  MEDICATIONS Medications self-administered by patient taken the night of the study : none reported  SLEEP ARCHITECTURE The study was initiated at 10:47:38 PM and ended at 4:46:32 AM.  Sleep onset time was 8.3 minutes and the sleep efficiency was 38.5%%. The total sleep time was 138 minutes.  Stage REM latency was 48.0 minutes.  The patient spent 21.4%% of the night in stage N1 sleep, 67.0%% in stage N2 sleep, 0.0%% in stage N3 and 11.6% in REM.  Alpha intrusion was absent.  Supine sleep was 10.51%.  RESPIRATORY PARAMETERS The overall apnea/hypopnea index (AHI) was 5.7 per hour. There were 0 total apneas, including 0 obstructive, 0 central and 0 mixed apneas. There were 13 hypopneas and 88 RERAs.  The AHI during Stage REM sleep was 11.3 per hour.  AHI while supine was 12.4 per hour.  The mean oxygen saturation was 92.5%. The minimum SpO2 during sleep was 86.0%.  soft snoring was noted during this study.  CARDIAC DATA The 2 lead EKG demonstrated sinus rhythm. The mean heart rate was 71.9 beats per minute. Other EKG findings include: PVCs.  LEG MOVEMENT  DATA The total PLMS were 0 with a resulting PLMS index of 0.0. Associated arousal with leg movement index was 4.8 .  IMPRESSIONS - Mild obstructive sleep apnea occurred during this study (AHI = 5.7/h). - No significant central sleep apnea occurred during this study (CAI = 0.0/h). - Mild oxygen desaturation was noted during this study (Min O2 = 86.0%). Mean O2 sat 91.6%. Time with O2 sat 88% or less was 12.3 minutes. - The patient snored with soft snoring volume. - EKG findings include PVCs. - Clinically significant periodic limb movements did not occur during sleep. No significant associated arousals. - Short total sleep time 138 minutes, awake after 1:30 AM, consistent with patient complaint that "clock is turned around, sleep inday, awake at night".  DIAGNOSIS - Obstructive Sleep Apnea (G47.33) - Nocturnal Hypoxemia (G47.36) - Circadian rhythm sleep disorder  RECOMMENDATIONS - Minimal OSA may not need treatment, except that with nocturnal hypoxemia, a trial of CPAP might be considered. . - Sleep hygiene should be reviewed to assess factors that may improve sleep quality,including attention to sleep schedule. - Weight management and regular exercise should be initiated or continued if appropriate.  [Electronically signed] 02/29/2020 05:09 PM  Baird Lyons MD, [place], American Board of Sleep Medicine   NPI: [id number]                         [place], [place] [facility]  ELECTRONICALLY SIGNED ON:  02/29/2020, 5:04 PM [place] PH: [phone]   FX: [phone] [place]

## 2020-03-02 ENCOUNTER — Ambulatory Visit (INDEPENDENT_AMBULATORY_CARE_PROVIDER_SITE_OTHER): Payer: Medicare Other | Admitting: Cardiology

## 2020-03-02 ENCOUNTER — Other Ambulatory Visit: Payer: Self-pay | Admitting: Nurse Practitioner

## 2020-03-02 ENCOUNTER — Other Ambulatory Visit: Payer: Self-pay

## 2020-03-02 ENCOUNTER — Encounter: Payer: Self-pay | Admitting: Cardiology

## 2020-03-02 VITALS — BP 120/68 | HR 87 | Ht 72.0 in | Wt 199.2 lb

## 2020-03-02 DIAGNOSIS — I2510 Atherosclerotic heart disease of native coronary artery without angina pectoris: Secondary | ICD-10-CM | POA: Diagnosis not present

## 2020-03-02 DIAGNOSIS — I10 Essential (primary) hypertension: Secondary | ICD-10-CM | POA: Diagnosis not present

## 2020-03-02 NOTE — Progress Notes (Signed)
   [place], [place]  [address] Phone:  [phone]   Fax:  [phone]  Sleep Apnea He underwent a sleep study on 02/19/2020.  The results are as follow; obstructive sleep apnea, nocturnal radial hypoxemia and circadian rhythm sleep disorder. The recommendation is minimal obstructive sleep apnea that may or may not need to be treated except for the nocturnal hypoxemia.  A trial of CPAP might be considered.  We will follow up with the patient to see if he wants to further evaluate this with CPAP titration trial.

## 2020-03-02 NOTE — Patient Instructions (Signed)
Medication Instructions:  The current medical regimen is effective;  continue present plan and medications.  *If you need a refill on your cardiac medications before your next appointment, please call your pharmacy*  Follow-Up: At [facility], you and your health needs are our priority.  As part of our continuing mission to provide you with exceptional heart care, we have created designated Provider Care Teams.  These Care Teams include your primary Cardiologist (physician) and Advanced Practice Providers (APPs -  Physician Assistants and Nurse Practitioners) who all work together to provide you with the care you need, when you need it.  We recommend signing up for the patient portal called "MyChart".  Sign up information is provided on this After Visit Summary.  MyChart is used to connect with patients for Virtual Visits (Telemedicine).  Patients are able to view lab/test results, encounter notes, upcoming appointments, etc.  Non-urgent messages can be sent to your provider as well.   To learn more about what you can do with MyChart, go to [email or web address].    Your next appointment:   12 month(s)  The format for your next appointment:   In Person  Provider:   Mark Skains, MD   Thank you for choosing [facility]!!      

## 2020-03-02 NOTE — Progress Notes (Signed)
Cardiology Office Note:    Date:  03/02/2020   ID:  [name], DOB [birth year], MRN [id number]  PCP:  Vevelyn Francois, NP  [facility] Cardiologist:  No primary care provider on file.  [facility] Electrophysiologist:  None   Referring MD: Vevelyn Francois, NP    History of Present Illness:    [name] is a 64 y.o. male here for the evaluation of non-ST elevation myocardial infarction 3 request of Dr. Edison Pace.  Has known coronary artery disease with subsequent four-vessel CABG in 10/31/2013.  Previously has seen Dr. Angelena Form in the past.  Last time was 2017 in the hospital setting.  At that time he was having some substance abuse and compliance issues.  Also had cardiomyopathy discovered in 2017 as well.  EF 35 to 40%.  Could have been cocaine induced.  Negative surrounding this discovery.  A repeat echocardiogram in 2020 showed normalized ejection fraction.  Past Medical History:  Diagnosis Date  . Anginal pain ([place])   . CAD (coronary artery disease)    4V CABG 10/31/13  . Cocaine abuse ([place])   . COPD (chronic obstructive pulmonary disease) ([place])   . GERD (gastroesophageal reflux disease)   . H/O: eczema    lower extremities  . H/O: GI bleed    a. 63 y/o ago, r/t ulcer. Denies recent GIB.  Marland Kitchen High cholesterol   . Hypertension   . Myocardial infarction ([place])   . Perforation of duodenal ulcer ([place])    a. 1970s - s/p surgery.   . Shortness of breath dyspnea   . Sleep disturbance 02/19/2020  . Tobacco abuse     Past Surgical History:  Procedure Laterality Date  . BIOPSY  11/05/2017   Procedure: BIOPSY;  Surgeon: Wilford Corner, MD;  Location: [facility];  Service: Endoscopy;;  . COLONOSCOPY WITH PROPOFOL N/A 07/04/2019   Procedure: COLONOSCOPY WITH PROPOFOL;  Surgeon: Milus Banister, MD;  Location: [facility];  Service: Endoscopy;  Laterality: N/A;  . CORONARY ARTERY BYPASS GRAFT N/A 10/31/2013   Procedure: CORONARY ARTERY BYPASS GRAFTING TIMES FOUR ON  PUMP USING LEFT INTERNAL MAMMARY ARTERY AND RIGHT GREATER SAPHENOUS VEIN VIA ENDOVEIN HARVEST.;  Surgeon: Melrose Nakayama, MD;  Location: [facility];  Service: Open Heart Surgery;  Laterality: N/A;  . ENDARTERECTOMY Left 11/06/2017   Procedure: ENDARTERECTOMY CAROTID;  Surgeon: Conrad Galena, MD;  Location: [facility];  Service: Vascular;  Laterality: Left;  . ESOPHAGOGASTRODUODENOSCOPY (EGD) WITH PROPOFOL N/A 11/05/2017   Procedure: ESOPHAGOGASTRODUODENOSCOPY (EGD) WITH PROPOFOL;  Surgeon: Wilford Corner, MD;  Location: [facility];  Service: Endoscopy;  Laterality: N/A;  . ESOPHAGOGASTRODUODENOSCOPY (EGD) WITH PROPOFOL N/A 07/04/2019   Procedure: ESOPHAGOGASTRODUODENOSCOPY (EGD) WITH PROPOFOL;  Surgeon: Milus Banister, MD;  Location: [facility];  Service: Endoscopy;  Laterality: N/A;  . INTRAOPERATIVE TRANSESOPHAGEAL ECHOCARDIOGRAM N/A 10/31/2013   Procedure: INTRAOPERATIVE TRANSESOPHAGEAL ECHOCARDIOGRAM;  Surgeon: Melrose Nakayama, MD;  Location: [facility];  Service: Open Heart Surgery;  Laterality: N/A;  . LEFT HEART CATHETERIZATION WITH CORONARY ANGIOGRAM N/A 10/28/2013   Procedure: LEFT HEART CATHETERIZATION WITH CORONARY ANGIOGRAM;  Surgeon: Burnell Blanks, MD;  [facility];  Service: Cardiovascular;  Laterality: N/A;  . POLYPECTOMY  07/04/2019   Procedure: POLYPECTOMY;  Surgeon: Milus Banister, MD;  Location: [facility];  Service: Endoscopy;;  . stomach sx      Current Medications: Current Meds  Medication Sig  . albuterol (PROVENTIL) (2.5 MG/3ML) 0.083% nebulizer solution Take 3 mLs (2.5 mg  total) by nebulization every 6 (six) hours as needed for wheezing or shortness of breath.  Marland Kitchen atorvastatin (LIPITOR) 80 MG tablet Take 1 tablet (80 mg total) by mouth daily at 6 PM.  . clopidogrel (PLAVIX) 75 MG tablet Take 1 tablet (75 mg total) by mouth daily.  . ferrous fumarate (HEMOCYTE - 106 MG FE) 325 (106 Fe) MG TABS tablet Take 1 tablet (106 mg of iron total) by mouth  daily.  . fluticasone (FLONASE) 50 MCG/ACT nasal spray Place 2 sprays into both nostrils as needed for allergies or rhinitis.  Marland Kitchen guaiFENesin (MUCINEX) 600 MG 12 hr tablet Take 600 mg by mouth 2 (two) times daily as needed (cough/congestion.).  Marland Kitchen hydrochlorothiazide (MICROZIDE) 12.5 MG capsule TAKE 1 CAPSULE (12.5 MG TOTAL) BY MOUTH DAILY.  . montelukast (SINGULAIR) 10 MG tablet TAKE 1 TABLET (10 MG TOTAL) BY MOUTH AT BEDTIME.  . Multiple Vitamin (MULTIVITAMIN WITH MINERALS) TABS tablet Take 1 tablet by mouth daily.  . pantoprazole (PROTONIX) 40 MG tablet Take 1 tablet (40 mg total) by mouth daily.  . Tiotropium Bromide-Olodaterol (STIOLTO RESPIMAT) 2.5-2.5 MCG/ACT AERS Inhale 2 puffs into the lungs daily.  Marland Kitchen triamcinolone (KENALOG) 0.025 % ointment APPLY 1 APPLICATION TOPICALLY 2 TIMES DAILY. (Patient taking differently: as needed. APPLY 1 APPLICATION TOPICALLY 2 TIMES DAILY.)  . varenicline (CHANTIX) 1 MG tablet Take 1 tablet (1 mg total) by mouth 2 (two) times daily.  . vitamin B-12 (CYANOCOBALAMIN) 500 MCG tablet Take 1 tablet (500 mcg total) by mouth daily.     Allergies:   Patient has no known allergies.   Social History   Socioeconomic History  . Marital status: Single    Spouse name: Not on file  . Number of children: 1  . Years of education: Not on file  . Highest education level: Bachelor's degree (e.g., BA, AB, BS)  Occupational History  . Occupation: retired  Tobacco Use  . Smoking status: Current Every Day Smoker    Packs/day: 0.25    Years: 40.00    Pack years: 10.00    Types: Cigarettes  . Smokeless tobacco: Never Used  . Tobacco comment: starting Chantix 01/04/18  Vaping Use  . Vaping Use: Never used  Substance and Sexual Activity  . Alcohol use: Yes    Alcohol/week: 4.0 standard drinks    Types: 4 Standard drinks or equivalent per week    Comment: vodka  . Drug use: Yes    Types: Marijuana, Cocaine    Comment: "about 1 marijuana cigarette a week"  . Sexual  activity: Yes    Birth control/protection: None  Other Topics Concern  . Not on file  Social History Narrative   Pt is right-handed. He lives alone in a one story house. He drinks 2 cups coffee a day, and a liter of ginger-ale. He walks 4 x a week.   Social Determinants of Health   Financial Resource Strain:   . Difficulty of Paying Living Expenses: Not on file  Food Insecurity:   . Worried About Charity fundraiser in the Last Year: Not on file  . Ran Out of Food in the Last Year: Not on file  Transportation Needs:   . Lack of Transportation (Medical): Not on file  . Lack of Transportation (Non-Medical): Not on file  Physical Activity:   . Days of Exercise per Week: Not on file  . Minutes of Exercise per Session: Not on file  Stress:   . Feeling of Stress : Not on  file  Social Connections:   . Frequency of Communication with Friends and Family: Not on file  . Frequency of Social Gatherings with Friends and Family: Not on file  . Attends Religious Services: Not on file  . Active Member of Clubs or Organizations: Not on file  . Attends Archivist Meetings: Not on file  . Marital Status: Not on file     Family History: The patient's family history includes Alzheimer's disease in his mother; HIV/AIDS in his brother; Lung cancer in his father, mother, and another family member; Obesity in his brother.  ROS:   Please see the history of present illness.     All other systems reviewed and are negative.  EKGs/Labs/Other Studies Reviewed:    The following studies were reviewed today:  ECHO 2020  1. The left ventricle has normal systolic function with an ejection  fraction of 60-65%. The cavity size was normal. There is moderately  increased left ventricular wall thickness. Left ventricular diastolic  Doppler parameters are consistent with impaired  relaxation. Indeterminate filling pressures The E/e' is 8-15. No evidence  of left ventricular regional wall motion  abnormalities.  2. The right ventricle has normal systolic function. The cavity was  normal. There is no increase in right ventricular wall thickness.  3. The mitral valve is abnormal. Mild thickening of the mitral valve  leaflet. There is mild to moderate mitral annular calcification present.  4. The tricuspid valve is grossly normal.  5. The aortic valve was not well visualized. No stenosis of the aortic  valve.  6. The aorta is normal unless otherwise noted.  7. The inferior vena cava was dilated in size with >50% respiratory  variability.   EKG:  EKG is  ordered today.  The ekg ordered today demonstrates sinus rhythm 87 poor R wave progression nonspecific ST-T wave changes  Recent Labs: 01/08/2020: B Natriuretic Peptide 78.8; BUN 9; Creatinine, Ser 1.05; Platelets 226 01/09/2020: Hemoglobin 14.3; Potassium 3.8; Sodium 139  Recent Lipid Panel    Component Value Date/Time   CHOL 133 01/08/2020 1114   TRIG 61 01/08/2020 1114   HDL 63 01/08/2020 1114   CHOLHDL 2.1 01/08/2020 1114   CHOLHDL 2.2 11/05/2017 0253   VLDL 11 11/05/2017 0253   LDLCALC 57 01/08/2020 1114    Physical Exam:    VS:  BP 120/68   Pulse 87   Ht 6' (1.829 m)   Wt 199 lb 3.2 oz (90.4 kg)   SpO2 96%   BMI 27.02 kg/m     Wt Readings from Last 3 Encounters:  03/02/20 199 lb 3.2 oz (90.4 kg)  02/19/20 200 lb (90.7 kg)  01/08/20 201 lb (91.2 kg)     GEN:  Well nourished, well developed in no acute distress HEENT: Normal NECK: No JVD; No carotid bruits LYMPHATICS: No lymphadenopathy CARDIAC: RRR, no murmurs, rubs, gallops, CBAG RESPIRATORY:  Clear to auscultation without rales, wheezing or rhonchi  ABDOMEN: Soft, non-tender, non-distended MUSCULOSKELETAL:  No edema; No deformity  SKIN: Warm and dry NEUROLOGIC:  Alert and oriented x 3 PSYCHIATRIC:  Normal affect   ASSESSMENT:    1. Essential hypertension   2. Coronary artery disease involving native coronary artery of native heart without  angina pectoris    PLAN:    In order of problems listed above:  Ischemic cardiomyopathy/chronic systolic heart failure -Prior EF 35 to 40%.  Echocardiogram in 2020 showed resolution with EF of 65%.  Excellent. -Continue with atorvastatin 80 mg Plavix  75.  Blood pressure excellent.  Prior cocaine use -Encourage ongoing cessation.  Does state that he will occasionally smoke marijuana.  Coronary artery disease -Status post CABG 2015.  Currently not having any anginal symptoms.  Strongly encouraged tobacco cessation.  Blood pressure under excellent control currently.  Continue with Plavix instead of aspirin.  HCTZ low-dose for blood pressure control.  Excellent.  He is also on high intensity statin with atorvastatin 80 mg.  LDL 57 at goal.  Outside labs reviewed.  Hemoglobin A1c 6.0 BUN 9 creatinine 1.0 TSH 0.4  TOB use --3 cigs a day.  Try to get the Chantix back.  Has been on recall.  He just cannot break those 3 cigarettes.  We will see in 1 year.   Medication Adjustments/Labs and Tests Ordered: Current medicines are reviewed at length with the patient today.  Concerns regarding medicines are outlined above.  Orders Placed This Encounter  Procedures  . EKG 12-Lead   No orders of the defined types were placed in this encounter.   Patient Instructions  Medication Instructions:  The current medical regimen is effective;  continue present plan and medications.  *If you need a refill on your cardiac medications before your next appointment, please call your pharmacy*  Follow-Up: At [facility], you and your health needs are our priority.  As part of our continuing mission to provide you with exceptional heart care, we have created designated Provider Care Teams.  These Care Teams include your primary Cardiologist (physician) and Advanced Practice Providers (APPs -  Physician Assistants and Nurse Practitioners) who all work together to provide you with the care you need, when you  need it.  We recommend signing up for the patient portal called "MyChart".  Sign up information is provided on this After Visit Summary.  MyChart is used to connect with patients for Virtual Visits (Telemedicine).  Patients are able to view lab/test results, encounter notes, upcoming appointments, etc.  Non-urgent messages can be sent to your provider as well.   To learn more about what you can do with MyChart, go to [email or web address].    Your next appointment:   12 month(s)  The format for your next appointment:   In Person  Provider:   Candee Furbish, MD   Thank you for choosing [facility]!!        Signed, Candee Furbish, MD  03/02/2020 11:27 AM    [place]

## 2020-03-04 ENCOUNTER — Telehealth: Payer: Self-pay | Admitting: Emergency Medicine

## 2020-03-04 ENCOUNTER — Other Ambulatory Visit: Payer: Self-pay | Admitting: Nurse Practitioner

## 2020-03-04 ENCOUNTER — Other Ambulatory Visit: Payer: Self-pay

## 2020-03-04 DIAGNOSIS — I10 Essential (primary) hypertension: Secondary | ICD-10-CM

## 2020-03-04 MED ORDER — HYDROCHLOROTHIAZIDE 12.5 MG PO CAPS: 12.5000 mg | ORAL_CAPSULE | Freq: Every day | ORAL | 1 refills | Status: DC

## 2020-03-04 MED FILL — HYDROCHLOROTHIAZIDE 12.5 MG: 12.5 | 30 days supply | Qty: 30 | Fill #0

## 2020-03-04 MED FILL — STIOLTO RESPIMAT INHAL SPRY: 2.5-2.5 | 30 days supply | Qty: 4 | Fill #4

## 2020-03-04 MED FILL — PANTOPRAZOLE SOD DR 40 MG T: 40 | 30 days supply | Qty: 30 | Fill #2

## 2020-03-04 MED FILL — ALBUTEROL SULFATE HFA 108 (: 108 (90 BAS | 25 days supply | Qty: 9 | Fill #2

## 2020-03-04 NOTE — Telephone Encounter (Signed)
Tried calling pt and there was no answer- LMTCB.  

## 2020-03-05 NOTE — Telephone Encounter (Signed)
Lmtcb for pt.  

## 2020-03-06 ENCOUNTER — Ambulatory Visit: Payer: Medicare Other | Admitting: Emergency Medicine

## 2020-03-11 NOTE — Telephone Encounter (Signed)
Attempted to call pt but unable to reach. Left message for him to return call. Due to multiple attempts trying to reach pt and unable to do so, per triage protocol encounter will be closed.

## 2020-03-26 MED FILL — CLOPIDOGREL 75 MG TABLET: 75 | 90 days supply | Qty: 90 | Fill #1

## 2020-03-26 MED FILL — ALBUTEROL SULFATE HFA 108 (: 108 (90 BAS | 25 days supply | Qty: 9 | Fill #3

## 2020-04-08 MED FILL — HYDROCHLOROTHIAZIDE 12.5 MG: 12.5 | 30 days supply | Qty: 30 | Fill #1

## 2020-04-08 MED FILL — PANTOPRAZOLE SOD DR 40 MG T: 40 | 30 days supply | Qty: 30 | Fill #3

## 2020-04-08 MED FILL — STIOLTO RESPIMAT INHAL SPRY: 2.5-2.5 | 30 days supply | Qty: 4 | Fill #5

## 2020-04-09 ENCOUNTER — Ambulatory Visit (INDEPENDENT_AMBULATORY_CARE_PROVIDER_SITE_OTHER): Payer: Medicare Other | Admitting: Nurse Practitioner

## 2020-04-09 ENCOUNTER — Other Ambulatory Visit: Payer: Self-pay

## 2020-04-09 ENCOUNTER — Encounter: Payer: Self-pay | Admitting: Nurse Practitioner

## 2020-04-09 ENCOUNTER — Other Ambulatory Visit: Payer: Self-pay | Admitting: Pharmacist

## 2020-04-09 VITALS — BP 131/82 | HR 97 | Temp 99.0°F | Ht 73.0 in | Wt 192.8 lb

## 2020-04-09 DIAGNOSIS — I214 Non-ST elevation (NSTEMI) myocardial infarction: Secondary | ICD-10-CM | POA: Diagnosis not present

## 2020-04-09 DIAGNOSIS — R7303 Prediabetes: Secondary | ICD-10-CM | POA: Diagnosis not present

## 2020-04-09 DIAGNOSIS — J449 Chronic obstructive pulmonary disease, unspecified: Secondary | ICD-10-CM

## 2020-04-09 DIAGNOSIS — Z23 Encounter for immunization: Secondary | ICD-10-CM

## 2020-04-09 DIAGNOSIS — Z0000 Encounter for general adult medical examination without abnormal findings: Secondary | ICD-10-CM | POA: Diagnosis not present

## 2020-04-09 DIAGNOSIS — I10 Essential (primary) hypertension: Secondary | ICD-10-CM

## 2020-04-09 LAB — POCT GLYCOSYLATED HEMOGLOBIN (HGB A1C)
HbA1c POC (<> result, manual entry): 5.4 % (ref 4.0–5.6)
HbA1c, POC (controlled diabetic range): 5.4 % (ref 0.0–7.0)
HbA1c, POC (prediabetic range): 5.4 % — AB (ref 5.7–6.4)
Hemoglobin A1C: 5.4 % (ref 4.0–5.6)

## 2020-04-09 MED ORDER — FLUTICASONE PROPIONATE 50 MCG/ACT NA SUSP: 2.0000 | NASAL | 2 refills | Status: DC | PRN

## 2020-04-09 MED ORDER — FERROUS FUMARATE 325 (106 FE) MG PO TABS: 1.0000 | ORAL_TABLET | Freq: Every day | ORAL | 0 refills | Status: AC

## 2020-04-09 MED ORDER — ALBUTEROL SULFATE HFA 108 (90 BASE) MCG/ACT IN AERS: 1.0000 | INHALATION_SPRAY | RESPIRATORY_TRACT | 11 refills | Status: DC | PRN

## 2020-04-09 MED ORDER — GUAIFENESIN ER 600 MG PO TB12: 600.0000 mg | ORAL_TABLET | Freq: Two times a day (BID) | ORAL | 1 refills | Status: AC | PRN

## 2020-04-09 MED ORDER — ATORVASTATIN CALCIUM 80 MG PO TABS: 80.0000 mg | ORAL_TABLET | Freq: Every day | ORAL | 3 refills | Status: DC

## 2020-04-09 MED ORDER — ALBUTEROL SULFATE (2.5 MG/3ML) 0.083% IN NEBU: 2.5000 mg | INHALATION_SOLUTION | Freq: Four times a day (QID) | RESPIRATORY_TRACT | 1 refills | Status: DC | PRN

## 2020-04-09 MED ORDER — MONTELUKAST SODIUM 10 MG PO TABS: 10.0000 mg | ORAL_TABLET | Freq: Every day | ORAL | 3 refills | Status: DC

## 2020-04-09 MED ORDER — BUPROPION HCL ER (SR) 150 MG PO TB12
150.0000 mg | ORAL_TABLET | Freq: Two times a day (BID) | ORAL | 2 refills | Status: DC
Start: 2020-04-09 — End: 2020-07-16

## 2020-04-09 MED ORDER — CYANOCOBALAMIN 500 MCG PO TABS: 500.0000 ug | ORAL_TABLET | Freq: Every day | ORAL | 0 refills | Status: AC

## 2020-04-09 MED ORDER — CLOPIDOGREL BISULFATE 75 MG PO TABS: 75.0000 mg | ORAL_TABLET | Freq: Every day | ORAL | 3 refills | Status: AC

## 2020-04-09 MED FILL — BUPROPION SR 150 MG TABLET: 150 | 30 days supply | Qty: 60 | Fill #0

## 2020-04-09 MED FILL — ALBUTEROL SUL 2.5 MG/3 ML S: (2.5 MG/3ML | 12 days supply | Qty: 150 | Fill #0

## 2020-04-09 MED FILL — PROAIR HFA 90 MCG INHALER: 108 (90 BAS | 16 days supply | Qty: 9 | Fill #0

## 2020-04-09 MED FILL — FLUTICASONE PROP 50 MCG SPR: 50 | 30 days supply | Qty: 16 | Fill #0

## 2020-04-09 NOTE — Progress Notes (Signed)
[place], [place]  [address] Phone:  [phone]   Fax:  [phone]   Established Patient Office Visit  Subjective:  Patient ID: [name], male    DOB: 1956-05-21  Age: 64 y.o. MRN: [id number]  CC:  Chief Complaint  Patient presents with   Follow-up    HPI [name] presents for follow up. He  has a past medical history of Anginal pain ([place]), CAD (coronary artery disease), Cocaine abuse ([place]), COPD (chronic obstructive pulmonary disease) ([place]), GERD (gastroesophageal reflux disease), H/O: eczema, H/O: GI bleed, High cholesterol, Hypertension, Myocardial infarction [facility]), Perforation of duodenal ulcer ([facility]), Shortness of breath dyspnea, Sleep disturbance (02/19/2020), and Tobacco abuse.   Hypertension Patient is here for follow-up of elevated blood pressure. He is not exercising and is adherent to a low-salt diet. Blood pressure is well controlled at home. Cardiac symptoms: none. Patient denies chest pain, dyspnea, exertional chest pressure/discomfort, fatigue, irregular heart beat, orthopnea, palpitations and syncope. Cardiovascular risk factors: advanced age (older than 22 for men, 21 for women), dyslipidemia, hypertension, male gender, obesity (BMI >= 30 kg/m2) and smoking/ tobacco exposure. Use of agents associated with hypertension: decongestants. History of target organ damage: angina/ prior myocardial infarction and stroke.   Past Medical History:  Diagnosis Date   Anginal pain ([place])    CAD (coronary artery disease)    4V CABG 10/31/13   Cocaine abuse ([place])    COPD (chronic obstructive pulmonary disease) (H[facility])    GERD (gastroesophageal reflux disease)    H/O: eczema    lower extremities   H/O: GI bleed    a. 64 y/o ago, r/t ulcer. Denies recent GIB.   High cholesterol    Hypertension    Myocardial infarction ([place])    Perforation of duodenal ulcer ([place])    a. 1970s - s/p surgery.    Shortness of  breath dyspnea    Sleep disturbance 02/19/2020   Tobacco abuse     Past Surgical History:  Procedure Laterality Date   BIOPSY  11/05/2017   Procedure: BIOPSY;  Surgeon: Wilford Corner, MD;  Location: [place];  Service: Endoscopy;;   COLONOSCOPY WITH PROPOFOL N/A 07/04/2019   Procedure: COLONOSCOPY WITH PROPOFOL;  Surgeon: Milus Banister, MD;  Location: [facility];  Service: Endoscopy;  Laterality: N/A;   CORONARY ARTERY BYPASS GRAFT N/A 10/31/2013   Procedure: CORONARY ARTERY BYPASS GRAFTING TIMES FOUR ON PUMP USING LEFT INTERNAL MAMMARY ARTERY AND RIGHT GREATER SAPHENOUS VEIN VIA ENDOVEIN HARVEST.;  Surgeon: Melrose Nakayama, MD;  Location: [place];  Service: Open Heart Surgery;  Laterality: N/A;   ENDARTERECTOMY Left 11/06/2017   Procedure: ENDARTERECTOMY CAROTID;  Surgeon: Conrad Leander, MD;  Location: [place];  Service: Vascular;  Laterality: Left;   ESOPHAGOGASTRODUODENOSCOPY (EGD) WITH PROPOFOL N/A 11/05/2017   Procedure: ESOPHAGOGASTRODUODENOSCOPY (EGD) WITH PROPOFOL;  Surgeon: Wilford Corner, MD;  Location: [place];  Service: Endoscopy;  Laterality: N/A;   ESOPHAGOGASTRODUODENOSCOPY (EGD) WITH PROPOFOL N/A 07/04/2019   Procedure: ESOPHAGOGASTRODUODENOSCOPY (EGD) WITH PROPOFOL;  Surgeon: Milus Banister, MD;  [facility];  Service: Endoscopy;  Laterality: N/A;   INTRAOPERATIVE TRANSESOPHAGEAL ECHOCARDIOGRAM N/A 10/31/2013   Procedure: INTRAOPERATIVE TRANSESOPHAGEAL ECHOCARDIOGRAM;  Surgeon: Melrose Nakayama, MD;  Location: [place];  Service: Open Heart Surgery;  Laterality: N/A;   LEFT HEART CATHETERIZATION WITH CORONARY ANGIOGRAM N/A 10/28/2013   Procedure: LEFT HEART CATHETERIZATION WITH CORONARY ANGIOGRAM;  Surgeon: Burnell Blanks, MD;  Location: [facility] C[facility];  Service: Cardiovascular;  Laterality: N/A;   POLYPECTOMY  07/04/2019   Procedure: POLYPECTOMY;  Surgeon: Milus Banister, MD;  [facility];  Service: Endoscopy;;    stomach sx      Family History  Problem Relation Age of Onset   Lung cancer Mother    Alzheimer's disease Mother    Lung cancer Father    HIV/AIDS Brother    Obesity Brother    Lung cancer Other     Social History   Socioeconomic History   Marital status: Single    Spouse name: Not on file   Number of children: 1   Years of education: Not on file   Highest education level: Bachelor's degree (e.g., BA, AB, BS)  Occupational History   Occupation: retired  Tobacco Use   Smoking status: Current Every Day Smoker    Packs/day: 0.25    Years: 40.00    Pack years: 10.00    Types: Cigarettes   Smokeless tobacco: Never Used   Tobacco comment: starting Chantix 01/04/18  Vaping Use   Vaping Use: Never used  Substance and Sexual Activity   Alcohol use: Yes    Alcohol/week: 4.0 standard drinks    Types: 4 Standard drinks or equivalent per week    Comment: vodka   Drug use: Yes    Types: Marijuana, Cocaine    Comment: "about 1 marijuana cigarette a week"   Sexual activity: Yes    Birth control/protection: None  Other Topics Concern   Not on file  Social History Narrative   Pt is right-handed. He lives alone in a one story house. He drinks 2 cups coffee a day, and a liter of ginger-ale. He walks 4 x a week.   Social Determinants of Health   Financial Resource Strain:    Difficulty of Paying Living Expenses: Not on file  Food Insecurity:    Worried About Charity fundraiser in the Last Year: Not on file   [employer] of Food in the Last Year: Not on file  Transportation Needs:    Lack of Transportation (Medical): Not on file   Lack of Transportation (Non-Medical): Not on file  Physical Activity:    Days of Exercise per Week: Not on file   Minutes of Exercise per Session: Not on file  Stress:    Feeling of Stress : Not on file  Social Connections:    Frequency of Communication with Friends and Family: Not on file   Frequency of Social Gatherings  with Friends and Family: Not on file   Attends Religious Services: Not on file   Active Member of Clubs or Organizations: Not on file   Attends Archivist Meetings: Not on file   Marital Status: Not on file  Intimate Partner Violence:    Fear of Current or Ex-Partner: Not on file   Emotionally Abused: Not on file   Physically Abused: Not on file   Sexually Abused: Not on file    Outpatient Medications Prior to Visit  Medication Sig Dispense Refill   hydrochlorothiazide (MICROZIDE) 12.5 MG capsule Take 1 capsule (12.5 mg total) by mouth daily. 30 capsule 1   Multiple Vitamin (MULTIVITAMIN WITH MINERALS) TABS tablet Take 1 tablet by mouth daily. 30 tablet 1   pantoprazole (PROTONIX) 40 MG tablet Take 1 tablet (40 mg total) by mouth daily. 30 tablet 11   Tiotropium Bromide-Olodaterol (STIOLTO RESPIMAT) 2.5-2.5 MCG/ACT AERS Inhale 2 puffs into the lungs daily. 12 g 0  triamcinolone (KENALOG) 0.025 % ointment APPLY 1 APPLICATION TOPICALLY 2 TIMES DAILY. (Patient taking differently: as needed. APPLY 1 APPLICATION TOPICALLY 2 TIMES DAILY.) 30 g 2   albuterol (PROVENTIL) (2.5 MG/3ML) 0.083% nebulizer solution Take 3 mLs (2.5 mg total) by nebulization every 6 (six) hours as needed for wheezing or shortness of breath. 150 mL 1   atorvastatin (LIPITOR) 80 MG tablet Take 1 tablet (80 mg total) by mouth daily at 6 PM. 90 tablet 1   clopidogrel (PLAVIX) 75 MG tablet Take 1 tablet (75 mg total) by mouth daily. 90 tablet 1   ferrous fumarate (HEMOCYTE - 106 MG FE) 325 (106 Fe) MG TABS tablet Take 1 tablet (106 mg of iron total) by mouth daily. 30 tablet 0   guaiFENesin (MUCINEX) 600 MG 12 hr tablet Take 600 mg by mouth 2 (two) times daily as needed (cough/congestion.).     montelukast (SINGULAIR) 10 MG tablet TAKE 1 TABLET (10 MG TOTAL) BY MOUTH AT BEDTIME. 30 tablet 3   vitamin B-12 (CYANOCOBALAMIN) 500 MCG tablet Take 1 tablet (500 mcg total) by mouth daily. 30 tablet 0    albuterol (PROAIR HFA) 108 (90 Base) MCG/ACT inhaler Inhale 1-2 puffs into the lungs every 4 (four) hours as needed for wheezing or shortness of breath. 6.7 g 11   fluticasone (FLONASE) 50 MCG/ACT nasal spray Place 2 sprays into both nostrils as needed for allergies or rhinitis. 10 mL 2   varenicline (CHANTIX) 1 MG tablet Take 1 tablet (1 mg total) by mouth 2 (two) times daily. (Patient not taking: Reported on 04/09/2020) 60 tablet 3   No facility-administered medications prior to visit.    No Known Allergies  ROS Review of Systems    Objective:    Physical Exam Constitutional:      Appearance: He is normal weight.  HENT:     Head: Normocephalic and atraumatic.     Nose: Nose normal.     Mouth/Throat:     Mouth: Mucous membranes are moist.  Cardiovascular:     Rate and Rhythm: Normal rate. Rhythm irregular.     Pulses: Normal pulses.     Heart sounds: Normal heart sounds.  Pulmonary:     Effort: Pulmonary effort is normal.     Breath sounds: Normal breath sounds.  Abdominal:     Palpations: Abdomen is soft.  Musculoskeletal:        General: Normal range of motion.     Comments: Trace edema BLE  Skin:    General: Skin is warm and dry.     Capillary Refill: Capillary refill takes less than 2 seconds.  Neurological:     General: No focal deficit present.     Mental Status: He is alert and oriented to person, place, and time.  Psychiatric:        Mood and Affect: Mood normal.        Behavior: Behavior normal.        Thought Content: Thought content normal.        Judgment: Judgment normal.     BP 131/82 (BP Location: Left Arm, Patient Position: Sitting, Cuff Size: Large)    Pulse 97    Temp 99 F (37.2 C)    Ht 6\' 1"  (1.854 m)    Wt 192 lb 12.8 oz (87.5 kg)    SpO2 98%    BMI 25.44 kg/m  Wt Readings from Last 3 Encounters:  04/09/20 192 lb 12.8 oz (87.5 kg)  03/02/20 199 lb  3.2 oz (90.4 kg)  02/19/20 200 lb (90.7 kg)     There are no preventive care reminders  to display for this patient.  There are no preventive care reminders to display for this patient.  Lab Results  Component Value Date   TSH 0.478 11/05/2017   Lab Results  Component Value Date   WBC 8.2 04/09/2020   HGB 15.1 04/09/2020   HCT 45.1 04/09/2020   MCV 91 04/09/2020   PLT 327 04/09/2020   Lab Results  Component Value Date   NA 143 04/09/2020   K 4.4 04/09/2020   CO2 23 01/08/2020   GLUCOSE 101 (H) 04/09/2020   BUN 11 04/09/2020   CREATININE 1.34 (H) 04/09/2020   BILITOT 0.8 04/09/2020   ALKPHOS 136 (H) 04/09/2020   AST 26 04/09/2020   ALT 22 02/21/2019   PROT 7.4 04/09/2020   ALBUMIN 4.9 (H) 04/09/2020   CALCIUM 10.2 04/09/2020   ANIONGAP 9 01/08/2020   GFR 82.90 04/30/2019   Lab Results  Component Value Date   CHOL 133 01/08/2020   Lab Results  Component Value Date   HDL 63 01/08/2020   Lab Results  Component Value Date   LDLCALC 57 01/08/2020   Lab Results  Component Value Date   TRIG 61 01/08/2020   Lab Results  Component Value Date   CHOLHDL 2.1 01/08/2020   Lab Results  Component Value Date   HGBA1C 5.4 04/09/2020   HGBA1C 5.4 04/09/2020   HGBA1C 5.4 (A) 04/09/2020   HGBA1C 5.4 04/09/2020      Assessment & Plan:   Problem List Items Addressed This Visit      Cardiovascular and Mediastinum   Essential hypertension Encouraged on going compliance with current medication regimen Encouraged home monitoring and recording BP <130/80 Eating a heart-healthy diet with less salt Encouraged regular physical activity  Recommend Weight loss   Relevant Medications   atorvastatin (LIPITOR) 80 MG tablet   NSTEMI (non-ST elevated myocardial infarction) (HCC)   Relevant Medications   atorvastatin (LIPITOR) 80 MG tablet   clopidogrel (PLAVIX) 75 MG tablet     Respiratory   Chronic obstructive pulmonary disease (HCC)   Relevant Medications   albuterol (PROVENTIL) (2.5 MG/3ML) 0.083% nebulizer solution   guaiFENesin (MUCINEX) 600 MG 12 hr  tablet   montelukast (SINGULAIR) 10 MG tablet   fluticasone (FLONASE) 50 MCG/ACT nasal spray   albuterol (PROAIR HFA) 108 (90 Base) MCG/ACT inhaler   Other Relevant Orders   CBC with Differential/Platelet (Completed)    Other Visit Diagnoses    Pre-diabetes    -  Primary Consider home glucose monitoring  lifestyle modification dietary changes and regular daily exercise Encourage blood pressure control goal <120/80 and maintaining total cholesterol <200 Follow-up every 3 to 6 months for reevaluation   Relevant Orders   Comp. Metabolic Panel (12) (Completed)   POC HgB A1c (Completed)   Healthcare maintenance       Relevant Orders   Flu Vaccine QUAD 6+ mos PF IM (Fluarix Quad PF) (Completed)   Pneumococcal polysaccharide vaccine 23-valent greater than or equal to 2yo subcutaneous/IM (Completed)      Meds ordered this encounter  Medications   albuterol (PROVENTIL) (2.5 MG/3ML) 0.083% nebulizer solution    Sig: Take 3 mLs (2.5 mg total) by nebulization every 6 (six) hours as needed for wheezing or shortness of breath.    Dispense:  150 mL    Refill:  1    Order Specific Question:   Supervising  Provider    Answer:   Tresa Garter [[id number]]   [place]: albuterol (PROAIR HFA) 108 (90 Base) MCG/ACT inhaler    Sig: Inhale 1-2 puffs into the lungs every 4 (four) hours as needed for wheezing or shortness of breath.    Dispense:  6.7 g    Refill:  11    Order Specific Question:   Supervising Provider    Answer:   Tresa Garter [[id number]]   buPROPion (WELLBUTRIN SR) 150 MG 12 hr tablet    Sig: Take 1 tablet (150 mg total) by mouth 2 (two) times daily.    Dispense:  60 tablet    Refill:  2    Order Specific Question:   Supervising Provider    Answer:   Tresa Garter [[id number]]   atorvastatin (LIPITOR) 80 MG tablet    Sig: Take 1 tablet (80 mg total) by mouth daily at 6 PM.    Dispense:  90 tablet    Refill:  3    Order Specific Question:   Supervising Provider      Answer:   Tresa Garter [[id number]]   clopidogrel (PLAVIX) 75 MG tablet    Sig: Take 1 tablet (75 mg total) by mouth daily.    Dispense:  90 tablet    Refill:  3    Order Specific Question:   Supervising Provider    Answer:   Tresa Garter [id number]   ferrous fumarate (HEMOCYTE - 106 MG FE) 325 (106 Fe) MG TABS tablet    Sig: Take 1 tablet (106 mg of iron total) by mouth daily.    Dispense:  30 tablet    Refill:  0    Order Specific Question:   Supervising Provider    Answer:   Tresa Garter [[id number]]   guaiFENesin (MUCINEX) 600 MG 12 hr tablet    Sig: Take 1 tablet (600 mg total) by mouth 2 (two) times daily as needed (cough/congestion.).    Dispense:  90 tablet    Refill:  1    Order Specific Question:   Supervising Provider    Answer:   Tresa Garter [[id number]]   montelukast (SINGULAIR) 10 MG tablet    Sig: Take 1 tablet (10 mg total) by mouth at bedtime.    Dispense:  90 tablet    Refill:  3    Order Specific Question:   Supervising Provider    Answer:   Tresa Garter [[id number]]   vitamin B-12 (CYANOCOBALAMIN) 500 MCG tablet    Sig: Take 1 tablet (500 mcg total) by mouth daily.    Dispense:  30 tablet    Refill:  0    Order Specific Question:   Supervising Provider    Answer:   Tresa Garter [[id number]]   fluticasone (FLONASE) 50 MCG/ACT nasal spray    Sig: Place 2 sprays into both nostrils as needed for allergies or rhinitis.    Dispense:  10 mL    Refill:  2    Order Specific Question:   Supervising Provider    Answer:   Tresa Garter [[id number]]   albuterol (PROAIR HFA) 108 (90 Base) MCG/ACT inhaler    Sig: Inhale 1-2 puffs into the lungs every 4 (four) hours as needed for wheezing or shortness of breath.    Dispense:  8.5 g    Refill:  11    Order Specific Question:   Supervising Provider    Answer:   Tresa Garter [  [id number]]    Follow-up: Return in about 3 months (around 07/10/2020).    Vevelyn Francois,  NP

## 2020-04-09 NOTE — Progress Notes (Signed)
resent

## 2020-04-09 NOTE — Progress Notes (Unsigned)
Pt needs rx for Proair (quant # 8.5g). I have pended a rx with the correct quantity for PCP approval. Will route to PCP.

## 2020-04-09 NOTE — Patient Instructions (Signed)
Steps to Quit Smoking Smoking tobacco is the leading cause of preventable death. It can affect almost every organ in the body. Smoking puts you and people around you at risk for many serious, long-lasting (chronic) diseases. Quitting smoking can be hard, but it is one of the best things that you can do for your health. It is never too late to quit. How do I get ready to quit? When you decide to quit smoking, make a plan to help you succeed. Before you quit:  Pick a date to quit. Set a date within the next 2 weeks to give you time to prepare.  Write down the reasons why you are quitting. Keep this list in places where you will see it often.  Tell your family, friends, and co-workers that you are quitting. Their support is important.  Talk with your doctor about the choices that may help you quit.  Find out if your health insurance will pay for these treatments.  Know the people, places, things, and activities that make you want to smoke (triggers). Avoid them. What first steps can I take to quit smoking?  Throw away all cigarettes at home, at work, and in your car.  Throw away the things that you use when you smoke, such as ashtrays and lighters.  Clean your car. Make sure to empty the ashtray.  Clean your home, including curtains and carpets. What can I do to help me quit smoking? Talk with your doctor about taking medicines and seeing a counselor at the same time. You are more likely to succeed when you do both.  If you are pregnant or breastfeeding, talk with your doctor about counseling or other ways to quit smoking. Do not take medicine to help you quit smoking unless your doctor tells you to do so. To quit smoking: Quit right away  Quit smoking totally, instead of slowly cutting back on how much you smoke over a period of time.  Go to counseling. You are more likely to quit if you go to counseling sessions regularly. Take medicine You may take medicines to help you quit. Some  medicines need a prescription, and some you can buy over-the-counter. Some medicines may contain a drug called nicotine to replace the nicotine in cigarettes. Medicines may:  Help you to stop having the desire to smoke (cravings).  Help to stop the problems that come when you stop smoking (withdrawal symptoms). Your doctor may ask you to use:  Nicotine patches, gum, or lozenges.  Nicotine inhalers or sprays.  Non-nicotine medicine that is taken by mouth. Find resources Find resources and other ways to help you quit smoking and remain smoke-free after you quit. These resources are most helpful when you use them often. They include:  Online chats with a counselor.  Phone quitlines.  Printed self-help materials.  Support groups or group counseling.  Text messaging programs.  Mobile phone apps. Use apps on your mobile phone or tablet that can help you stick to your quit plan. There are many free apps for mobile phones and tablets as well as websites. Examples include Quit Guide from the CDC and smokefree.gov  What things can I do to make it easier to quit?   Talk to your family and friends. Ask them to support and encourage you.  Call a phone quitline (1-800-QUIT-NOW), reach out to support groups, or work with a counselor.  Ask people who smoke to not smoke around you.  Avoid places that make you want to smoke,   such as: ? Bars. ? Parties. ? Smoke-break areas at work.  Spend time with people who do not smoke.  Lower the stress in your life. Stress can make you want to smoke. Try these things to help your stress: ? Getting regular exercise. ? Doing deep-breathing exercises. ? Doing yoga. ? Meditating. ? Doing a body scan. To do this, close your eyes, focus on one area of your body at a time from head to toe. Notice which parts of your body are tense. Try to relax the muscles in those areas. How will I feel when I quit smoking? Day 1 to 3 weeks Within the first 24 hours,  you may start to have some problems that come from quitting tobacco. These problems are very bad 2-3 days after you quit, but they do not often last for more than 2-3 weeks. You may get these symptoms:  Mood swings.  Feeling restless, nervous, angry, or annoyed.  Trouble concentrating.  Dizziness.  Strong desire for high-sugar foods and nicotine.  Weight gain.  Trouble pooping (constipation).  Feeling like you may vomit (nausea).  Coughing or a sore throat.  Changes in how the medicines that you take for other issues work in your body.  Depression.  Trouble sleeping (insomnia). Week 3 and afterward After the first 2-3 weeks of quitting, you may start to notice more positive results, such as:  Better sense of smell and taste.  Less coughing and sore throat.  Slower heart rate.  Lower blood pressure.  Clearer skin.  Better breathing.  Fewer sick days. Quitting smoking can be hard. Do not give up if you fail the first time. Some people need to try a few times before they succeed. Do your best to stick to your quit plan, and talk with your doctor if you have any questions or concerns. Summary  Smoking tobacco is the leading cause of preventable death. Quitting smoking can be hard, but it is one of the best things that you can do for your health.  When you decide to quit smoking, make a plan to help you succeed.  Quit smoking right away, not slowly over a period of time.  When you start quitting, seek help from your doctor, family, or friends. This information is not intended to replace advice given to you by your health care provider. Make sure you discuss any questions you have with your health care provider. Document Revised: 02/22/2019 Document Reviewed: 08/18/2018 [employer] Patient Education  2020 [employer]  

## 2020-04-10 LAB — CBC WITH DIFFERENTIAL/PLATELET
Basophils Absolute: 0 10*3/uL (ref 0.0–0.2)
Basos: 0 %
EOS (ABSOLUTE): 0.6 10*3/uL — ABNORMAL HIGH (ref 0.0–0.4)
Eos: 8 %
Hematocrit: 45.1 % (ref 37.5–51.0)
Hemoglobin: 15.1 g/dL (ref 13.0–17.7)
Immature Grans (Abs): 0 10*3/uL (ref 0.0–0.1)
Immature Granulocytes: 0 %
Lymphocytes Absolute: 1.6 10*3/uL (ref 0.7–3.1)
Lymphs: 19 %
MCH: 30.5 pg (ref 26.6–33.0)
MCHC: 33.5 g/dL (ref 31.5–35.7)
MCV: 91 fL (ref 79–97)
Monocytes Absolute: 0.8 10*3/uL (ref 0.1–0.9)
Monocytes: 9 %
Neutrophils Absolute: 5.2 10*3/uL (ref 1.4–7.0)
Neutrophils: 64 %
Platelets: 327 10*3/uL (ref 150–450)
RBC: 4.95 x10E6/uL (ref 4.14–5.80)
RDW: 13.9 % (ref 11.6–15.4)
WBC: 8.2 10*3/uL (ref 3.4–10.8)

## 2020-04-10 LAB — COMP. METABOLIC PANEL (12)
AST: 26 IU/L (ref 0–40)
Albumin/Globulin Ratio: 2 (ref 1.2–2.2)
Albumin: 4.9 g/dL — ABNORMAL HIGH (ref 3.8–4.8)
Alkaline Phosphatase: 136 IU/L — ABNORMAL HIGH (ref 44–121)
BUN/Creatinine Ratio: 8 — ABNORMAL LOW (ref 10–24)
BUN: 11 mg/dL (ref 8–27)
Bilirubin Total: 0.8 mg/dL (ref 0.0–1.2)
Calcium: 10.2 mg/dL (ref 8.6–10.2)
Chloride: 102 mmol/L (ref 96–106)
Creatinine, Ser: 1.34 mg/dL — ABNORMAL HIGH (ref 0.76–1.27)
GFR calc Af Amer: 63 mL/min/{1.73_m2} (ref >59)
GFR calc non Af Amer: 55 mL/min/{1.73_m2} — ABNORMAL LOW (ref >59)
Globulin, Total: 2.5 g/dL (ref 1.5–4.5)
Glucose: 101 mg/dL — ABNORMAL HIGH (ref 65–99)
Potassium: 4.4 mmol/L (ref 3.5–5.2)
Sodium: 143 mmol/L (ref 134–144)
Total Protein: 7.4 g/dL (ref 6.0–8.5)

## 2020-04-24 ENCOUNTER — Ambulatory Visit: Payer: Medicare Other | Attending: Internal Medicine

## 2020-04-24 DIAGNOSIS — Z23 Encounter for immunization: Secondary | ICD-10-CM

## 2020-04-24 NOTE — Progress Notes (Signed)
   Cov[facility] Vaccination [facility]  Name:  DARRYLE DENNIE    MRN: [id number] DOB: 08/12/1955  04/24/2020  Mr. Micalizzi was observed post Covid-19 immunization for 15 minutes without incident. He was provided with Vaccine Information Sheet and instruction to access the V-Safe system.   Mr. Tugwell was instructed to call 911 with any severe reactions post vaccine: Marland Kitchen Difficulty breathing  . Swelling of face and throat  . A fast heartbeat  . A bad rash all over body  . Dizziness and weakness

## 2020-04-28 MED FILL — MONTELUKAST SOD 10 MG TAB: 10 | 90 days supply | Qty: 90 | Fill #0

## 2020-05-04 ENCOUNTER — Other Ambulatory Visit: Payer: Self-pay | Admitting: Nurse Practitioner

## 2020-05-04 ENCOUNTER — Other Ambulatory Visit: Payer: Self-pay | Admitting: Emergency Medicine

## 2020-05-04 DIAGNOSIS — I10 Essential (primary) hypertension: Secondary | ICD-10-CM

## 2020-05-04 MED FILL — PROAIR HFA 90 MCG INHALER: 108 (90 BAS | 16 days supply | Qty: 9 | Fill #1

## 2020-05-04 MED FILL — HYDROCHLOROTHIAZIDE 12.5 MG: 12.5 | 30 days supply | Qty: 30 | Fill #0

## 2020-05-04 MED FILL — PANTOPRAZOLE SOD DR 40 MG T: 40 | 30 days supply | Qty: 30 | Fill #4

## 2020-05-04 MED FILL — ALBUTEROL 0.083% INHAL SOLN: (2.5 MG/3ML | 12 days supply | Qty: 150 | Fill #1

## 2020-05-04 NOTE — Telephone Encounter (Signed)
Please see med refill request °

## 2020-05-06 MED FILL — MONTELUKAST SOD 10 MG TAB: 10 | 90 days supply | Qty: 90 | Fill #0

## 2020-05-11 ENCOUNTER — Telehealth: Payer: Self-pay | Admitting: Emergency Medicine

## 2020-05-12 ENCOUNTER — Other Ambulatory Visit: Payer: Self-pay | Admitting: Emergency Medicine

## 2020-05-12 MED FILL — STIOLTO RESPIMAT INHAL SPRY: 2.5-2.5 | 30 days supply | Qty: 4 | Fill #0

## 2020-05-12 NOTE — Telephone Encounter (Signed)
Called and spoke with patient, advised that Stiolto was sent to his pharmacy.  Nothing further needed.

## 2020-05-12 NOTE — Telephone Encounter (Signed)
Patient is returning phone call. Patient phone number is [phone].

## 2020-05-12 NOTE — Telephone Encounter (Signed)
ATC patient unable to reach LM to call back office (x1)  

## 2020-05-14 MED FILL — BUPROPION SR 150 MG TABLET: 150 | 30 days supply | Qty: 60 | Fill #1

## 2020-05-20 MED FILL — STIOLTO RESPIMAT INHAL SPRY: 2.5-2.5 | 30 days supply | Qty: 4 | Fill #0

## 2020-05-28 ENCOUNTER — Other Ambulatory Visit: Payer: Self-pay | Admitting: Nurse Practitioner

## 2020-05-28 DIAGNOSIS — J449 Chronic obstructive pulmonary disease, unspecified: Secondary | ICD-10-CM

## 2020-05-28 MED FILL — PROAIR HFA 90 MCG INHALER: 108 (90 BAS | 16 days supply | Qty: 9 | Fill #2

## 2020-05-28 MED FILL — HYDROCHLOROTHIAZIDE 12.5 MG: 12.5 | 30 days supply | Qty: 30 | Fill #1

## 2020-05-28 MED FILL — ALBUTEROL 0.083% INHAL SOLN: (2.5 MG/3ML | 12 days supply | Qty: 150 | Fill #0

## 2020-05-28 MED FILL — PANTOPRAZOLE SOD DR 40 MG T: 40 | 30 days supply | Qty: 30 | Fill #5

## 2020-06-15 MED FILL — BUPROPION SR 150 MG TABLET: 150 | 30 days supply | Qty: 60 | Fill #2

## 2020-06-15 MED FILL — ALBUTEROL 0.083% INHAL SOLN: (2.5 MG/3ML | 12 days supply | Qty: 150 | Fill #1

## 2020-06-15 MED FILL — PROAIR HFA 90 MCG INHALER: 108 (90 BAS | 16 days supply | Qty: 9 | Fill #3

## 2020-06-15 MED FILL — ATORVASTATIN CALCIUM 80 MG: 80 | 90 days supply | Qty: 90 | Fill #3

## 2020-06-19 MED FILL — CLOPIDOGREL 75 MG TABLET: 75 | 90 days supply | Qty: 90 | Fill #2

## 2020-06-22 ENCOUNTER — Encounter: Payer: Self-pay | Admitting: Emergency Medicine

## 2020-06-22 ENCOUNTER — Ambulatory Visit (INDEPENDENT_AMBULATORY_CARE_PROVIDER_SITE_OTHER): Payer: Medicare HMO | Admitting: Emergency Medicine

## 2020-06-22 ENCOUNTER — Other Ambulatory Visit: Payer: Self-pay

## 2020-06-22 ENCOUNTER — Other Ambulatory Visit: Payer: Self-pay | Admitting: Emergency Medicine

## 2020-06-22 DIAGNOSIS — J438 Other emphysema: Secondary | ICD-10-CM | POA: Diagnosis not present

## 2020-06-22 DIAGNOSIS — Z720 Tobacco use: Secondary | ICD-10-CM | POA: Diagnosis not present

## 2020-06-22 MED ORDER — ALBUTEROL SULFATE HFA 108 (90 BASE) MCG/ACT IN AERS: 2.0000 | INHALATION_SPRAY | Freq: Four times a day (QID) | RESPIRATORY_TRACT | 3 refills | Status: DC | PRN

## 2020-06-22 MED FILL — PANTOPRAZOLE SOD DR 40 MG T: 40 | 30 days supply | Qty: 30 | Fill #6

## 2020-06-22 NOTE — Assessment & Plan Note (Addendum)
Discussed smoking cessation strategies with him today.  Continue Wellbutrin.  Discussed the role for nicotine patches as he tries to quit.

## 2020-06-22 NOTE — Patient Instructions (Signed)
Continue your Stiolto 2 puffs once daily for now. Keep your albuterol available to use either 2 puffs or 1 nebulizer treatment when you need it for shortness of breath, chest tightness, wheezing. Continue your Mucinex twice a day Agree with Wellbutrin as you have been taking Continue to work hard on decreasing your cigarettes.  When you believe that you are ready to set a quit date and try stopping, start using a nicotine patch 14 mg on the day before.  Get rid of all of your cigarettes, lighters, matches, ashtrays.  Tell your friends and people around you that you are quitting so that they will try to be supportive.  After a few days you can try decreasing the nicotine patch to 7 mg with a goal of weaning the patches off over a week. COVID-19 vaccine up-to-date Follow with Dr Lamonte Sakai in 6 months or sooner if you have any problems

## 2020-06-22 NOTE — Assessment & Plan Note (Signed)
2 exacerbations in the last 12 months.  If that trend continues then I will need to change his Stiolto to an alternative with an ICS.  Continue the Stiolto for now, continue supplemental albuterol for now.  Most important thing for him to stop smoking.  Also encouraged him to get back to his exercise regimen

## 2020-06-22 NOTE — Progress Notes (Signed)
Subjective:    Patient ID: [name], male    DOB: 1956-02-10, 65 y.o.   MRN: [id number]  HPI 65 year old active smoker (40 pack years) with a history of cocaine abuse, coronary disease/CABG (2015), hypertension, peptic ulcer disease and prior GI bleed, GERD, allergic rhinitis.  He carries a history of COPD that was diagnosed by PFT in 2015 before his CABG, I reviewed, shows severe obstruction without a bronchodilator response, hyperinflated volumes and an intact diffusion capacity. Currently uses Breo, has albuterol HFA and nebs that he uses approximately.  He is on fluticasone nasal spray as needed (allergy season), Singulair, Protonix once daily  He has been having progressive exertional dyspnea, some increased cough.  He was hospitalized 8/27 with chest discomfort and dyspnea that awakened him from sleep.  That was after smoking cocaine, from which he had been abstinent for many years prior.  He was treated for an acute exacerbation of COPD. He improved with steroids. He feels back to baseline - but this is characterized by exertional SOB, has slowly progressed over several years. Especially bothersome in the am when he wakes up. He has cough  - productive of clear mucous, can happen all day long. He is on mucinex. Uses albuterol 3-4x a day, benefits from this. He has cut his cigarettes down to 3 a day, is on chantix. His Prevnar is up to date, flu shot up to date.   ROV 05/23/2019 --[name] follows up today for his history of tobacco use and presumed COPD.  He also has a history of CAD/CABG, hypertension, PUD, GERD, allergic rhinitis, cocaine use.  I met him in October and his maintenance was Breo.  We tried changing him to [employer].  He reports that he feels about the same, his walk distance has increased. He hears some wheeze and cough through the day. He has some congestion and nocturnal cough. He is on singulair, has flonase to use prn but has not been using it. He uses albuterol every  morning, when taking his daily walk. He sometimes has to use albuterol at night to clear secretions.  He is still smoking about 3-5 cig a day. He has set a quit date for 06/14/2019. He is on chantix currently, has been on for 4-5 months.   He underwent pulmonary function testing today that I have reviewed, this shows very severe obstruction with a positive bronchodilator response, profound hyperinflation of his volumes, decreased diffusion capacity that does not completely correct to the normal range when adjusted for alveolar volume.  We talked today about smoking cessation strategies today.   ROV 06/22/20 --65 year old man with a history of tobacco use and severe obstruction, COPD with a positive bronchodilator response, profound hyperinflation.  He continues to smoke approximately.  We have been managing him on Stiolto. He was in the ED for COPD exacerbation in July 2021. He recovered. Now he notes that he is having more trouble with climbing stairs. He was walking 1.5 mile a day several days a week until the last few months - he wonders whether this influences his exertional tolerance. He had been on chantix until it was recalled. Smoking 5-6 cig a day. He is now on Wellbutrin for the last 2 months. He has moderate cough, congestion on mucinex. He is using his nebulized albuterol every morning, about 2 x a day total. He had 2 exacerbations last year.  MDM: Reviewed ED notes from 01/08/2020 Reviewed IM note from 01/08/2020    Review of Systems  Constitutional: Negative for fever and unexpected weight change.  HENT: Negative for congestion, dental problem, ear pain, nosebleeds, postnasal drip, rhinorrhea, sinus pressure, sneezing, sore throat and trouble swallowing.   Eyes: Negative for redness and itching.  Respiratory: Positive for cough and shortness of breath. Negative for chest tightness and wheezing.   Cardiovascular: Negative for palpitations and leg swelling.  Gastrointestinal: Negative for  nausea and vomiting.  Genitourinary: Negative for dysuria.  Musculoskeletal: Negative for joint swelling.  Skin: Negative for rash.  Neurological: Negative for headaches.  Hematological: Does not bruise/bleed easily.  Psychiatric/Behavioral: Negative for dysphoric mood. The patient is not nervous/anxious.    Past Medical History:  Diagnosis Date  . Anginal pain ([place])   . CAD (coronary artery disease)    4V CABG 10/31/13  . Cocaine abuse ([place])   . COPD (chronic obstructive pulmonary disease) ([place])   . GERD (gastroesophageal reflux disease)   . H/O: eczema    lower extremities  . H/O: GI bleed    a. 65 y/o ago, r/t ulcer. Denies recent GIB.  [place] [facility] High cholesterol   . Hypertension   . Myocardial infarction ([place])   . Perforation of duodenal ulcer ([place])    a. 1970s - s/p surgery.   . Shortness of breath dyspnea   . Sleep disturbance 02/19/2020  . Tobacco abuse      Family History  Problem Relation Age of Onset  . Lung cancer Mother   . Alzheimer's disease Mother   . Lung cancer Father   . HIV/AIDS Brother   . Obesity Brother   . Lung cancer Other      Social History   Socioeconomic History  . Marital status: Single    Spouse name: Not on file  . Number of children: 1  . Years of education: Not on file  . Highest education level: Bachelor's degree (e.g., BA, AB, BS)  Occupational History  . Occupation: retired  Tobacco Use  . Smoking status: Current Every Day Smoker    Packs/day: 0.25    Years: 40.00    Pack years: 10.00    Types: Cigarettes  . Smokeless tobacco: Never Used  . Tobacco comment: smoking 5-6/day  Vaping Use  . Vaping Use: Never used  Substance and Sexual Activity  . Alcohol use: Yes    Alcohol/week: 4.0 standard drinks    Types: 4 Standard drinks or equivalent per week    Comment: vodka  . Drug use: Yes    Types: Marijuana, Cocaine    Comment: "about 1 marijuana cigarette a week"  . Sexual activity: Yes    Birth control/protection: None  Other  Topics Concern  . Not on file  Social History Narrative   Pt is right-handed. He lives alone in a one story house. He drinks 2 cups coffee a day, and a liter of ginger-ale. He walks 4 x a week.   Social Determinants of Health   Financial Resource Strain: Not on file  Food Insecurity: Not on file  Transportation Needs: Not on file  Physical Activity: Not on file  Stress: Not on file  Social Connections: Not on file  Intimate Partner Violence: Not on file    Has worked for [employer] in [place] and [place] No inhaled exposures No military   No Known Allergies   Outpatient Medications Prior to Visit  Medication Sig Dispense Refill  . albuterol (PROVENTIL) (2.5 MG/3ML) 0.083% nebulizer solution TAKE 3 MLS (2.5 MG TOTAL) BY NEBULIZATION EVERY 6 (SIX) HOURS  AS NEEDED FOR WHEEZING OR SHORTNESS OF BREATH. 150 mL 1  . atorvastatin (LIPITOR) 80 MG tablet Take 1 tablet (80 mg total) by mouth daily at 6 PM. 90 tablet 3  . clopidogrel (PLAVIX) 75 MG tablet Take 1 tablet (75 mg total) by mouth daily. 90 tablet 3  . ferrous fumarate (HEMOCYTE - 106 MG FE) 325 (106 Fe) MG TABS tablet Take 1 tablet (106 mg of iron total) by mouth daily. 30 tablet 0  . fluticasone (FLONASE) 50 MCG/ACT nasal spray Place 2 sprays into both nostrils as needed for allergies or rhinitis. 10 mL 2  . guaiFENesin (MUCINEX) 600 MG 12 hr tablet Take 1 tablet (600 mg total) by mouth 2 (two) times daily as needed (cough/congestion.). 90 tablet 1  . hydrochlorothiazide (MICROZIDE) 12.5 MG capsule TAKE 1 CAPSULE (12.5 MG TOTAL) BY MOUTH DAILY. 30 capsule 1  . montelukast (SINGULAIR) 10 MG tablet Take 1 tablet (10 mg total) by mouth at bedtime. 90 tablet 3  . Multiple Vitamin (MULTIVITAMIN WITH MINERALS) TABS tablet Take 1 tablet by mouth daily. 30 tablet 1  . pantoprazole (PROTONIX) 40 MG tablet Take 1 tablet (40 mg total) by mouth daily. 30 tablet 11  . STIOLTO RESPIMAT 2.5-2.5 MCG/ACT AERS INHALE 2 PUFFS INTO THE LUNGS DAILY. 4 g  0  . triamcinolone (KENALOG) 0.025 % ointment APPLY 1 APPLICATION TOPICALLY 2 TIMES DAILY. 30 g 2  . vitamin B-12 (CYANOCOBALAMIN) 500 MCG tablet Take 1 tablet (500 mcg total) by mouth daily. 30 tablet 0  . albuterol (PROAIR HFA) 108 (90 Base) MCG/ACT inhaler Inhale 1-2 puffs into the lungs every 4 (four) hours as needed for wheezing or shortness of breath. 8.5 g 11  . buPROPion (WELLBUTRIN SR) 150 MG 12 hr tablet Take 1 tablet (150 mg total) by mouth 2 (two) times daily. 60 tablet 2   No facility-administered medications prior to visit.        Objective:   Physical Exam Vitals:   06/22/20 1542  BP: 130/80  Pulse: 95  Temp: (!) 97.5 F (36.4 C)  TempSrc: Temporal  SpO2: 96%  Weight: 193 lb 9.6 oz (87.8 kg)  Height: _0  (1.854 m)   Gen: Pleasant, well-nourished, in no distress,  normal affect  ENT: No lesions,  mouth clear,  oropharynx clear, no postnasal drip  Neck: No JVD, no stridor  Lungs: No use of accessory muscles, distant, no wheeze on normal breath or forced exp  Cardiovascular: RRR, heart sounds normal, no murmur or gallops, no peripheral edema  Musculoskeletal: No deformities, no cyanosis or clubbing  Neuro: alert, awake, non focal  Skin: Warm, no lesions or rash      Assessment & Plan:  Chronic obstructive pulmonary disease (HCC) 2 exacerbations in the last 12 months.  If that trend continues then I will need to change his Stiolto to an alternative with an ICS.  Continue the Stiolto for now, continue supplemental albuterol for now.  Most important thing for him to stop smoking.  Also encouraged him to get back to his exercise regimen  Tobacco abuse Discussed smoking cessation strategies with him today.  Continue Wellbutrin.  Discussed the role for nicotine patches as he tries to quit.  Baltazar Apo, MD, PhD 06/22/2020, 4:08 PM [facility] [phone] or if no answer [phone]

## 2020-06-22 NOTE — Addendum Note (Signed)
Addended by: Vanessa Barbara on: 06/22/2020 04:28 PM   Modules accepted: Orders

## 2020-07-07 ENCOUNTER — Other Ambulatory Visit: Payer: Self-pay | Admitting: Nurse Practitioner

## 2020-07-07 DIAGNOSIS — J449 Chronic obstructive pulmonary disease, unspecified: Secondary | ICD-10-CM

## 2020-07-07 MED FILL — PROAIR HFA 90 MCG INHALER: 108 (90 BAS | 16 days supply | Qty: 9 | Fill #4

## 2020-07-08 ENCOUNTER — Other Ambulatory Visit: Payer: Self-pay | Admitting: Emergency Medicine

## 2020-07-08 MED FILL — ALBUTEROL 0.083% INHAL SOLN: (2.5 MG/3ML | 12 days supply | Qty: 150 | Fill #0

## 2020-07-08 MED FILL — STIOLTO RESPIMAT INHAL SPRY: 2.5-2.5 | 30 days supply | Qty: 4 | Fill #0

## 2020-07-09 ENCOUNTER — Ambulatory Visit: Payer: Medicare Other | Admitting: Nurse Practitioner

## 2020-07-16 ENCOUNTER — Other Ambulatory Visit: Payer: Self-pay | Admitting: Nurse Practitioner

## 2020-07-16 ENCOUNTER — Other Ambulatory Visit: Payer: Self-pay

## 2020-07-16 ENCOUNTER — Encounter: Payer: Self-pay | Admitting: Nurse Practitioner

## 2020-07-16 ENCOUNTER — Ambulatory Visit (INDEPENDENT_AMBULATORY_CARE_PROVIDER_SITE_OTHER): Payer: Medicare HMO | Admitting: Nurse Practitioner

## 2020-07-16 VITALS — BP 140/66 | Temp 97.3°F | Ht 73.0 in | Wt 191.0 lb

## 2020-07-16 DIAGNOSIS — Z720 Tobacco use: Secondary | ICD-10-CM | POA: Diagnosis not present

## 2020-07-16 DIAGNOSIS — I10 Essential (primary) hypertension: Secondary | ICD-10-CM | POA: Diagnosis not present

## 2020-07-16 DIAGNOSIS — R351 Nocturia: Secondary | ICD-10-CM

## 2020-07-16 DIAGNOSIS — J449 Chronic obstructive pulmonary disease, unspecified: Secondary | ICD-10-CM

## 2020-07-16 MED ORDER — HYDROCHLOROTHIAZIDE 12.5 MG PO CAPS: 12.5000 mg | ORAL_CAPSULE | Freq: Every day | ORAL | 1 refills | Status: DC

## 2020-07-16 MED ORDER — BUPROPION HCL ER (SR) 150 MG PO TB12
150.0000 mg | ORAL_TABLET | Freq: Two times a day (BID) | ORAL | 11 refills | Status: DC
Start: 2020-07-16 — End: 2020-07-16

## 2020-07-16 MED FILL — HYDROCHLOROTHIAZIDE 12.5 MG: 12.5 | 30 days supply | Qty: 30 | Fill #0

## 2020-07-16 MED FILL — BUPROPION SR 150 MG TABLET: 150 | 30 days supply | Qty: 60 | Fill #0

## 2020-07-16 NOTE — Patient Instructions (Signed)
Steps to Quit Smoking Smoking tobacco is the leading cause of preventable death. It can affect almost every organ in the body. Smoking puts you and people around you at risk for many serious, long-lasting (chronic) diseases. Quitting smoking can be hard, but it is one of the best things that you can do for your health. It is never too late to quit. How do I get ready to quit? When you decide to quit smoking, make a plan to help you succeed. Before you quit:  Pick a date to quit. Set a date within the next 2 weeks to give you time to prepare.  Write down the reasons why you are quitting. Keep this list in places where you will see it often.  Tell your family, friends, and co-workers that you are quitting. Their support is important.  Talk with your doctor about the choices that may help you quit.  Find out if your health insurance will pay for these treatments.  Know the people, places, things, and activities that make you want to smoke (triggers). Avoid them. What first steps can I take to quit smoking?  Throw away all cigarettes at home, at work, and in your car.  Throw away the things that you use when you smoke, such as ashtrays and lighters.  Clean your car. Make sure to empty the ashtray.  Clean your home, including curtains and carpets. What can I do to help me quit smoking? Talk with your doctor about taking medicines and seeing a counselor at the same time. You are more likely to succeed when you do both.  If you are pregnant or breastfeeding, talk with your doctor about counseling or other ways to quit smoking. Do not take medicine to help you quit smoking unless your doctor tells you to do so. To quit smoking: Quit right away  Quit smoking totally, instead of slowly cutting back on how much you smoke over a period of time.  Go to counseling. You are more likely to quit if you go to counseling sessions regularly. Take medicine You may take medicines to help you quit. Some  medicines need a prescription, and some you can buy over-the-counter. Some medicines may contain a drug called nicotine to replace the nicotine in cigarettes. Medicines may:  Help you to stop having the desire to smoke (cravings).  Help to stop the problems that come when you stop smoking (withdrawal symptoms). Your doctor may ask you to use:  Nicotine patches, gum, or lozenges.  Nicotine inhalers or sprays.  Non-nicotine medicine that is taken by mouth. Find resources Find resources and other ways to help you quit smoking and remain smoke-free after you quit. These resources are most helpful when you use them often. They include:  Online chats with a counselor.  Phone quitlines.  Printed self-help materials.  Support groups or group counseling.  Text messaging programs.  Mobile phone apps. Use apps on your mobile phone or tablet that can help you stick to your quit plan. There are many free apps for mobile phones and tablets as well as websites. Examples include Quit Guide from the CDC and smokefree.gov   What things can I do to make it easier to quit?  Talk to your family and friends. Ask them to support and encourage you.  Call a phone quitline (1-800-QUIT-NOW), reach out to support groups, or work with a counselor.  Ask people who smoke to not smoke around you.  Avoid places that make you want to smoke,   such as: ? Bars. ? Parties. ? Smoke-break areas at work.  Spend time with people who do not smoke.  Lower the stress in your life. Stress can make you want to smoke. Try these things to help your stress: ? Getting regular exercise. ? Doing deep-breathing exercises. ? Doing yoga. ? Meditating. ? Doing a body scan. To do this, close your eyes, focus on one area of your body at a time from head to toe. Notice which parts of your body are tense. Try to relax the muscles in those areas.   How will I feel when I quit smoking? Day 1 to 3 weeks Within the first 24 hours,  you may start to have some problems that come from quitting tobacco. These problems are very bad 2-3 days after you quit, but they do not often last for more than 2-3 weeks. You may get these symptoms:  Mood swings.  Feeling restless, nervous, angry, or annoyed.  Trouble concentrating.  Dizziness.  Strong desire for high-sugar foods and nicotine.  Weight gain.  Trouble pooping (constipation).  Feeling like you may vomit (nausea).  Coughing or a sore throat.  Changes in how the medicines that you take for other issues work in your body.  Depression.  Trouble sleeping (insomnia). Week 3 and afterward After the first 2-3 weeks of quitting, you may start to notice more positive results, such as:  Better sense of smell and taste.  Less coughing and sore throat.  Slower heart rate.  Lower blood pressure.  Clearer skin.  Better breathing.  Fewer sick days. Quitting smoking can be hard. Do not give up if you fail the first time. Some people need to try a few times before they succeed. Do your best to stick to your quit plan, and talk with your doctor if you have any questions or concerns. Summary  Smoking tobacco is the leading cause of preventable death. Quitting smoking can be hard, but it is one of the best things that you can do for your health.  When you decide to quit smoking, make a plan to help you succeed.  Quit smoking right away, not slowly over a period of time.  When you start quitting, seek help from your doctor, family, or friends. This information is not intended to replace advice given to you by your health care provider. Make sure you discuss any questions you have with your health care provider. Document Revised: 02/22/2019 Document Reviewed: 08/18/2018 Elsevier Patient Education  2021 [employer]  

## 2020-07-16 NOTE — Progress Notes (Signed)
[name], [name]  [address] Phone:  [phone]   Fax:  [phone]   Established Patient Office Visit  Subjective:  Patient ID: [name], male    DOB: 09-01-1955  Age: 65 y.o. MRN: [id number]  CC: No chief complaint on file.   HPI [name] presents for follow up. He  has a past medical history of Anginal pain ([name]), CAD (coronary artery disease), Cocaine abuse ([name]), COPD (chronic obstructive pulmonary disease) ([name]), GERD (gastroesophageal reflux disease), H/O: eczema, H/O: GI bleed, High cholesterol, Hypertension, Myocardial infarction [name]), Perforation of duodenal ulcer ([name]), Shortness of breath dyspnea, Sleep disturbance (02/19/2020), and Tobacco abuse.   Hypertension Patient is here for follow-up of elevated blood pressure. He is not exercising and is adherent to a low-salt diet. Cardiac symptoms: dyspnea. He followed up with pulmonology. His COPD is progressing. He does continue to smoke. Patient denies chest pain. Cardiovascular risk factors: advanced age (older than 30 for men, 82 for women), hypertension, male gender and smoking/ tobacco exposure. He is on Wellbutrin BID no success with smoking cessation completely at this time. He had success with Chantix in the past and which this was an option. Use of agents associated with hypertension: none. History of target organ damage: angina/ prior myocardial infarction and stroke.  Past Medical History:  Diagnosis Date  . Anginal pain ([name])   . CAD (coronary artery disease)    4V CABG 10/31/13  . Cocaine abuse ([name])   . COPD (chronic obstructive pulmonary disease) ([name])   . GERD (gastroesophageal reflux disease)   . H/O: eczema    lower extremities  . H/O: GI bleed    a. 65 y/o ago, r/t ulcer. Denies recent GIB.  Marland Kitchen High cholesterol   . Hypertension   . Myocardial infarction ([name])   . Perforation of duodenal ulcer ([name])    a. 1970s - s/p surgery.   . Shortness of  breath dyspnea   . Sleep disturbance 02/19/2020  . Tobacco abuse     Past Surgical History:  Procedure Laterality Date  . BIOPSY  11/05/2017   Procedure: BIOPSY;  Surgeon: Johnny Corner, MD;  Location: [facility];  Service: Endoscopy;;  . COLONOSCOPY WITH PROPOFOL N/A 07/04/2019   Procedure: COLONOSCOPY WITH PROPOFOL;  Surgeon: Johnny Banister, MD;  [facility];  Service: Endoscopy;  Laterality: N/A;  . CORONARY ARTERY BYPASS GRAFT N/A 10/31/2013   Procedure: CORONARY ARTERY BYPASS GRAFTING TIMES FOUR ON PUMP USING LEFT INTERNAL MAMMARY ARTERY AND RIGHT GREATER SAPHENOUS VEIN VIA ENDOVEIN HARVEST.;  Surgeon: Johnny Nakayama, MD;  Location: [facility];  Service: Open Heart Surgery;  Laterality: N/A;  . ENDARTERECTOMY Left 11/06/2017   Procedure: ENDARTERECTOMY CAROTID;  Surgeon: Johnny [place], MD;  Location: [facility];  Service: Vascular;  Laterality: Left;  . ESOPHAGOGASTRODUODENOSCOPY (EGD) WITH PROPOFOL N/A 11/05/2017   Procedure: ESOPHAGOGASTRODUODENOSCOPY (EGD) WITH PROPOFOL;  Surgeon: Johnny Corner, MD;  Location: [facility];  Service: Endoscopy;  Laterality: N/A;  . ESOPHAGOGASTRODUODENOSCOPY (EGD) WITH PROPOFOL N/A 07/04/2019   Procedure: ESOPHAGOGASTRODUODENOSCOPY (EGD) WITH PROPOFOL;  Surgeon: Johnny Banister, MD;  [facility];  Service: Endoscopy;  Laterality: N/A;  . INTRAOPERATIVE TRANSESOPHAGEAL ECHOCARDIOGRAM N/A 10/31/2013   Procedure: INTRAOPERATIVE TRANSESOPHAGEAL ECHOCARDIOGRAM;  Surgeon: Johnny Nakayama, MD;  Location: [facility];  Service: Open Heart Surgery;  Laterality: N/A;  . LEFT HEART CATHETERIZATION WITH CORONARY ANGIOGRAM N/A 10/28/2013   Procedure: LEFT HEART CATHETERIZATION WITH CORONARY ANGIOGRAM;  Surgeon: Johnny Blanks, MD;  Location: [facility] CATH LAB;  Service: Cardiovascular;  Laterality: N/A;  . POLYPECTOMY  07/04/2019   Procedure: POLYPECTOMY;  Surgeon: Johnny Banister, MD;  [facility];  Service: Endoscopy;;  .  stomach sx      Family History  Problem Relation Age of Onset  . Lung cancer Mother   . Alzheimer's disease Mother   . Lung cancer Father   . HIV/AIDS Brother   . Obesity Brother   . Lung cancer Other     Social History   Socioeconomic History  . Marital status: Single    Spouse name: Not on file  . Number of children: 1  . Years of education: Not on file  . Highest education level: Bachelor's degree (e.g., BA, AB, BS)  Occupational History  . Occupation: retired  Tobacco Use  . Smoking status: Current Every Day Smoker    Packs/day: 0.25    Years: 40.00    Pack years: 10.00    Types: Cigarettes  . Smokeless tobacco: Never Used  . Tobacco comment: smoking 5-6/day  Vaping Use  . Vaping Use: Never used  Substance and Sexual Activity  . Alcohol use: Yes    Alcohol/week: 4.0 standard drinks    Types: 4 Standard drinks or equivalent per week    Comment: vodka  . Drug use: Yes    Types: Marijuana, Cocaine    Comment: "about 1 marijuana cigarette a week"  . Sexual activity: Yes    Birth control/protection: None  Other Topics Concern  . Not on file  Social History Narrative   Pt is right-handed. He lives alone in a one story house. He drinks 2 cups coffee a day, and a liter of ginger-ale. He walks 4 x a week.   Social Determinants of Health   Financial Resource Strain: Not on file  Food Insecurity: Not on file  Transportation Needs: Not on file  Physical Activity: Not on file  Stress: Not on file  Social Connections: Not on file  Intimate Partner Violence: Not on file    Outpatient Medications Prior to Visit  Medication Sig Dispense Refill  . albuterol (PROVENTIL) (2.5 MG/3ML) 0.083% nebulizer solution TAKE 3 MLS (2.5 MG TOTAL) BY NEBULIZATION EVERY 6 (SIX) HOURS AS NEEDED FOR WHEEZING OR SHORTNESS OF BREATH. 150 mL 1  . albuterol (VENTOLIN HFA) 108 (90 Base) MCG/ACT inhaler Inhale 2 puffs into the lungs every 6 (six) hours as needed for wheezing or shortness of  breath. 18 g 3  . atorvastatin (LIPITOR) 80 MG tablet Take 1 tablet (80 mg total) by mouth daily at 6 PM. 90 tablet 3  . clopidogrel (PLAVIX) 75 MG tablet Take 1 tablet (75 mg total) by mouth daily. 90 tablet 3  . ferrous fumarate (HEMOCYTE - 106 MG FE) 325 (106 Fe) MG TABS tablet Take 1 tablet (106 mg of iron total) by mouth daily. 30 tablet 0  . montelukast (SINGULAIR) 10 MG tablet Take 1 tablet (10 mg total) by mouth at bedtime. 90 tablet 3  . Multiple Vitamin (MULTIVITAMIN WITH MINERALS) TABS tablet Take 1 tablet by mouth daily. 30 tablet 1  . pantoprazole (PROTONIX) 40 MG tablet Take 1 tablet (40 mg total) by mouth daily. 30 tablet 11  . STIOLTO RESPIMAT 2.5-2.5 MCG/ACT AERS INHALE 2 PUFFS INTO THE LUNGS DAILY. 4 g 5  . triamcinolone (KENALOG) 0.025 % ointment APPLY 1 APPLICATION TOPICALLY 2 TIMES DAILY. 30 g 2  . vitamin B-12 (CYANOCOBALAMIN) 500  MCG tablet Take 1 tablet (500 mcg total) by mouth daily. 30 tablet 0  . hydrochlorothiazide (MICROZIDE) 12.5 MG capsule TAKE 1 CAPSULE (12.5 MG TOTAL) BY MOUTH DAILY. 30 capsule 1  . fluticasone (FLONASE) 50 MCG/ACT nasal spray Place 2 sprays into both nostrils as needed for allergies or rhinitis. 10 mL 2  . buPROPion (WELLBUTRIN SR) 150 MG 12 hr tablet Take 1 tablet (150 mg total) by mouth 2 (two) times daily. 60 tablet 2   No facility-administered medications prior to visit.    No Known Allergies  ROS Review of Systems  HENT: Positive for congestion.   Eyes: Negative.   Respiratory: Positive for cough (clear).   Cardiovascular: Negative for chest pain.  Gastrointestinal: Negative for constipation, nausea and vomiting.  Endocrine: Negative.   Genitourinary: Positive for frequency.       Nocturia Decreased stream  No hesitancy No hematuria   Hematological: Negative.       Objective:    Physical Exam Constitutional:      Appearance: He is normal weight.  HENT:     Head: Normocephalic and atraumatic.     Nose: Nose normal.      Mouth/Throat:     Mouth: Mucous membranes are moist.  Cardiovascular:     Rate and Rhythm: Normal rate and regular rhythm.     Pulses: Normal pulses.     Heart sounds: Normal heart sounds.  Pulmonary:     Effort: Pulmonary effort is normal.     Comments: Coarse and diminshed Abdominal:     Palpations: Abdomen is soft.  Musculoskeletal:        General: Normal range of motion.     Cervical back: Normal range of motion.  Skin:    General: Skin is warm and dry.     Capillary Refill: Capillary refill takes less than 2 seconds.  Neurological:     General: No focal deficit present.     Mental Status: He is alert and oriented to person, place, and time.  Psychiatric:        Mood and Affect: Mood normal.        Behavior: Behavior normal.        Thought Content: Thought content normal.        Judgment: Judgment normal.     BP 140/66 (BP Location: Left Arm, Patient Position: Sitting, Cuff Size: Normal)   Temp (!) 97.3 F (36.3 C) (Temporal)   Ht 6\' 1"  (1.854 m)   Wt 191 lb (86.6 kg)   BMI 25.20 kg/m  Wt Readings from Last 3 Encounters:  07/16/20 191 lb (86.6 kg)  06/22/20 193 lb 9.6 oz (87.8 kg)  04/09/20 192 lb 12.8 oz (87.5 kg)     There are no preventive care reminders to display for this patient.  There are no preventive care reminders to display for this patient.  Lab Results  Component Value Date   TSH 0.478 11/05/2017   Lab Results  Component Value Date   WBC 8.2 04/09/2020   HGB 15.1 04/09/2020   HCT 45.1 04/09/2020   MCV 91 04/09/2020   PLT 327 04/09/2020   Lab Results  Component Value Date   NA 145 (H) 07/16/2020   K 4.7 07/16/2020   CO2 23 01/08/2020   GLUCOSE 102 (H) 07/16/2020   BUN 14 07/16/2020   CREATININE 1.31 (H) 07/16/2020   BILITOT 0.5 07/16/2020   ALKPHOS 148 (H) 07/16/2020   AST 25 07/16/2020   ALT 22 02/21/2019  PROT 6.8 07/16/2020   ALBUMIN 4.4 07/16/2020   CALCIUM 9.8 07/16/2020   ANIONGAP 9 01/08/2020   GFR 82.90 04/30/2019    Lab Results  Component Value Date   CHOL 133 01/08/2020   Lab Results  Component Value Date   HDL 63 01/08/2020   Lab Results  Component Value Date   LDLCALC 57 01/08/2020   Lab Results  Component Value Date   TRIG 61 01/08/2020   Lab Results  Component Value Date   CHOLHDL 2.1 01/08/2020   Lab Results  Component Value Date   HGBA1C 5.4 04/09/2020   HGBA1C 5.4 04/09/2020   HGBA1C 5.4 (A) 04/09/2020   HGBA1C 5.4 04/09/2020      Assessment & Plan:   Problem List Items Addressed This Visit      Cardiovascular and Mediastinum   Essential hypertension Controlled  Encouraged on going compliance with current medication regimen Encouraged home monitoring and recording BP <130/80 Eating a heart-healthy diet with less salt Encouraged regular physical activity    Relevant Medications   hydrochlorothiazide (MICROZIDE) 12.5 MG capsule   Other Relevant Orders   Comp. Metabolic Panel (12) (Completed)     Respiratory   Chronic obstructive pulmonary disease (HCC) Persistent  Continue to follow up with pulmonology      Other   Tobacco abuse (Chronic) Persistent ,Discussed the risk factors associated with smoking ; CAD, COPD, Cancer, PVD increased susceptibility to respiratory illnesses Discussed treatment options with cessation ie counseling, support resources and available medications  Choosing a quit day and setting goals accordingly. Discussed ways to quit; start by decreasing one cigarette per day or per week.  On Zyban 150 mg BID Discussed potential side effects.  Encourage patient to call for assistance once ready to quit. Provided education handouts   Counseling 5-10 minutes     Other Visit Diagnoses    Nocturia more than twice per night    -  Primary Declined any treatment at this time  PSA pending Discussed male health maintenance; Discussed prostate age related concerns    Relevant Orders   PSA (Completed)      Meds ordered this encounter   Medications  . buPROPion (WELLBUTRIN SR) 150 MG 12 hr tablet    Sig: Take 1 tablet (150 mg total) by mouth 2 (two) times daily.    Dispense:  60 tablet    Refill:  11    Order Specific Question:   Supervising Provider    Answer:   Tresa Garter [id number]  . hydrochlorothiazide (MICROZIDE) 12.5 MG capsule    Sig: Take 1 capsule (12.5 mg total) by mouth daily.    Dispense:  30 capsule    Refill:  1    Order Specific Question:   Supervising Provider    Answer:   Tresa Garter [id number]    Follow-up: Return in about 3 months (around 10/13/2020) for Follow up HTN [id number].    Vevelyn Francois, NP

## 2020-07-17 LAB — PSA: Prostate Specific Ag, Serum: 0.7 ng/mL (ref 0.0–4.0)

## 2020-07-17 LAB — COMP. METABOLIC PANEL (12)
AST: 25 IU/L (ref 0–40)
Albumin/Globulin Ratio: 1.8 (ref 1.2–2.2)
Albumin: 4.4 g/dL (ref 3.8–4.8)
Alkaline Phosphatase: 148 IU/L — ABNORMAL HIGH (ref 44–121)
BUN/Creatinine Ratio: 11 (ref 10–24)
BUN: 14 mg/dL (ref 8–27)
Bilirubin Total: 0.5 mg/dL (ref 0.0–1.2)
Calcium: 9.8 mg/dL (ref 8.6–10.2)
Chloride: 103 mmol/L (ref 96–106)
Creatinine, Ser: 1.31 mg/dL — ABNORMAL HIGH (ref 0.76–1.27)
GFR calc Af Amer: 65 mL/min/{1.73_m2} (ref >59)
GFR calc non Af Amer: 56 mL/min/{1.73_m2} — ABNORMAL LOW (ref >59)
Globulin, Total: 2.4 g/dL (ref 1.5–4.5)
Glucose: 102 mg/dL — ABNORMAL HIGH (ref 65–99)
Potassium: 4.7 mmol/L (ref 3.5–5.2)
Sodium: 145 mmol/L — ABNORMAL HIGH (ref 134–144)
Total Protein: 6.8 g/dL (ref 6.0–8.5)

## 2020-07-29 ENCOUNTER — Other Ambulatory Visit: Payer: Self-pay | Admitting: Nurse Practitioner

## 2020-07-29 DIAGNOSIS — I10 Essential (primary) hypertension: Secondary | ICD-10-CM

## 2020-07-29 MED FILL — VENTOLIN HFA 90 MCG INHALER: 108 (90 BAS | 25 days supply | Qty: 18 | Fill #0

## 2020-07-29 MED FILL — PANTOPRAZOLE SOD DR 40 MG T: 40 | 30 days supply | Qty: 30 | Fill #7

## 2020-07-29 MED FILL — MONTELUKAST SOD 10 MG TAB: 10 | 90 days supply | Qty: 90 | Fill #1

## 2020-07-29 MED FILL — BUPROPION SR 150 MG TABLET: 150 | 30 days supply | Qty: 60 | Fill #0

## 2020-07-29 MED FILL — ALBUTEROL 0.083% INHAL SOLN: (2.5 MG/3ML | 12 days supply | Qty: 150 | Fill #1

## 2020-07-29 MED FILL — HYDROCHLOROTHIAZIDE 12.5 MG: 12.5 | 30 days supply | Qty: 30 | Fill #0

## 2020-07-29 NOTE — Progress Notes (Unsigned)
   [facility] [address] [place], [place]  [address] Phone:  [phone]   Fax:  [phone] 

## 2020-08-12 ENCOUNTER — Other Ambulatory Visit: Payer: Self-pay | Admitting: Nurse Practitioner

## 2020-08-12 DIAGNOSIS — J449 Chronic obstructive pulmonary disease, unspecified: Secondary | ICD-10-CM

## 2020-08-12 MED FILL — TRIAMCINOLONE 0.025% OINT: 0.025 | 15 days supply | Qty: 30 | Fill #2

## 2020-08-12 MED FILL — ALBUTEROL 0.083% INHAL SOLN: (2.5 MG/3ML | 12 days supply | Qty: 150 | Fill #0

## 2020-08-12 NOTE — Telephone Encounter (Signed)
Please see med refill request

## 2020-08-12 NOTE — Telephone Encounter (Signed)
Please see medication request.

## 2020-08-13 MED FILL — FLUTICASONE PROP 50 MCG SPR: 50 | 30 days supply | Qty: 16 | Fill #0

## 2020-08-13 MED FILL — STIOLTO RESPIMAT INHAL SPRY: 2.5-2.5 | 30 days supply | Qty: 4 | Fill #1

## 2020-08-31 MED FILL — ATORVASTATIN CALCIUM 80 MG: 80 | 90 days supply | Qty: 90 | Fill #0

## 2020-08-31 MED FILL — ALBUTEROL SULFATE HFA 108 (: 108 (90 BAS | 25 days supply | Qty: 18 | Fill #1

## 2020-09-02 ENCOUNTER — Other Ambulatory Visit: Payer: Self-pay | Admitting: Nurse Practitioner

## 2020-09-02 DIAGNOSIS — J449 Chronic obstructive pulmonary disease, unspecified: Secondary | ICD-10-CM

## 2020-09-02 MED FILL — ALBUTEROL 0.083% INHAL SOLN: (2.5 MG/3ML | 12 days supply | Qty: 150 | Fill #0

## 2020-09-12 ENCOUNTER — Other Ambulatory Visit: Payer: Self-pay

## 2020-09-24 ENCOUNTER — Other Ambulatory Visit: Payer: Self-pay | Admitting: Nurse Practitioner

## 2020-09-24 DIAGNOSIS — I10 Essential (primary) hypertension: Secondary | ICD-10-CM

## 2020-09-24 DIAGNOSIS — J449 Chronic obstructive pulmonary disease, unspecified: Secondary | ICD-10-CM

## 2020-09-24 MED ORDER — ALBUTEROL SULFATE (2.5 MG/3ML) 0.083% IN NEBU
3.0000 mL | INHALATION_SOLUTION | Freq: Four times a day (QID) | RESPIRATORY_TRACT | 0 refills | Status: DC | PRN
  Filled 2020-09-24: qty 150, 13d supply, fill #0

## 2020-09-24 MED ORDER — HYDROCHLOROTHIAZIDE 12.5 MG PO CAPS
ORAL_CAPSULE | Freq: Every day | ORAL | 1 refills | Status: AC
  Filled 2020-09-24: qty 30, 30d supply, fill #0

## 2020-09-24 MED FILL — Clopidogrel Bisulfate Tab 75 MG (Base Equiv): ORAL | 30 days supply | Qty: 30 | Fill #0 | Status: AC

## 2020-09-24 MED FILL — Pantoprazole Sodium EC Tab 40 MG (Base Equiv): ORAL | 30 days supply | Qty: 30 | Fill #0 | Status: AC

## 2020-09-28 ENCOUNTER — Other Ambulatory Visit: Payer: Self-pay

## 2020-09-29 ENCOUNTER — Other Ambulatory Visit: Payer: Self-pay

## 2020-09-29 MED FILL — Albuterol Sulfate Inhal Aero 108 MCG/ACT (90MCG Base Equiv): RESPIRATORY_TRACT | 16 days supply | Qty: 8.5 | Fill #0 | Status: AC

## 2020-09-30 ENCOUNTER — Other Ambulatory Visit: Payer: Self-pay

## 2020-10-02 ENCOUNTER — Other Ambulatory Visit: Payer: Self-pay

## 2020-10-02 MED FILL — Bupropion HCl Tab ER 12HR 150 MG: ORAL | 30 days supply | Qty: 60 | Fill #0 | Status: AC

## 2020-10-05 ENCOUNTER — Other Ambulatory Visit: Payer: Self-pay

## 2020-10-07 ENCOUNTER — Other Ambulatory Visit: Payer: Self-pay

## 2020-10-07 MED FILL — Tiotropium Br-Olodaterol Inhal Aero Soln 2.5-2.5 MCG/ACT: RESPIRATORY_TRACT | 30 days supply | Qty: 4 | Fill #0 | Status: AC

## 2020-10-08 ENCOUNTER — Other Ambulatory Visit: Payer: Self-pay

## 2020-10-12 ENCOUNTER — Other Ambulatory Visit: Payer: Self-pay | Admitting: Nurse Practitioner

## 2020-10-12 ENCOUNTER — Other Ambulatory Visit: Payer: Self-pay

## 2020-10-12 DIAGNOSIS — J449 Chronic obstructive pulmonary disease, unspecified: Secondary | ICD-10-CM

## 2020-10-12 MED ORDER — ALBUTEROL SULFATE (2.5 MG/3ML) 0.083% IN NEBU
3.0000 mL | INHALATION_SOLUTION | Freq: Four times a day (QID) | RESPIRATORY_TRACT | 0 refills | Status: DC | PRN
  Filled 2020-10-12: qty 150, 13d supply, fill #0

## 2020-10-13 ENCOUNTER — Other Ambulatory Visit: Payer: Self-pay

## 2020-10-14 ENCOUNTER — Encounter: Payer: Self-pay | Admitting: Nurse Practitioner

## 2020-10-14 ENCOUNTER — Ambulatory Visit: Payer: Medicare HMO

## 2020-10-14 ENCOUNTER — Ambulatory Visit (INDEPENDENT_AMBULATORY_CARE_PROVIDER_SITE_OTHER): Payer: Medicare HMO | Admitting: Nurse Practitioner

## 2020-10-14 ENCOUNTER — Other Ambulatory Visit: Payer: Self-pay

## 2020-10-14 VITALS — BP 123/70 | Temp 98.4°F | Ht 73.0 in | Wt 191.0 lb

## 2020-10-14 DIAGNOSIS — F17200 Nicotine dependence, unspecified, uncomplicated: Secondary | ICD-10-CM

## 2020-10-14 DIAGNOSIS — E43 Unspecified severe protein-calorie malnutrition: Secondary | ICD-10-CM | POA: Diagnosis not present

## 2020-10-14 DIAGNOSIS — D649 Anemia, unspecified: Secondary | ICD-10-CM

## 2020-10-14 DIAGNOSIS — R7303 Prediabetes: Secondary | ICD-10-CM | POA: Diagnosis not present

## 2020-10-14 DIAGNOSIS — I10 Essential (primary) hypertension: Secondary | ICD-10-CM

## 2020-10-14 DIAGNOSIS — J449 Chronic obstructive pulmonary disease, unspecified: Secondary | ICD-10-CM | POA: Diagnosis not present

## 2020-10-14 LAB — POCT URINALYSIS DIPSTICK
Bilirubin, UA: NEGATIVE
Blood, UA: NEGATIVE
Glucose, UA: NEGATIVE
Ketones, UA: NEGATIVE
Leukocytes, UA: NEGATIVE
Nitrite, UA: NEGATIVE
Protein, UA: NEGATIVE
Spec Grav, UA: 1.02 (ref 1.010–1.025)
Urobilinogen, UA: 0.2 E.U./dL
pH, UA: 6 (ref 5.0–8.0)

## 2020-10-14 MED ORDER — ALBUTEROL SULFATE (2.5 MG/3ML) 0.083% IN NEBU
3.0000 mL | INHALATION_SOLUTION | Freq: Four times a day (QID) | RESPIRATORY_TRACT | 9 refills | Status: AC | PRN
  Filled 2020-10-14: qty 150, 38d supply, fill #0

## 2020-10-14 MED ORDER — NICOTINE 21 MG/24HR TD PT24
21.0000 mg | MEDICATED_PATCH | Freq: Every day | TRANSDERMAL | 5 refills | Status: AC
  Filled 2020-10-14: qty 28, 28d supply, fill #0

## 2020-10-14 NOTE — Progress Notes (Signed)
[place], [place]  [address] Phone:  [phone]   Fax:  [phone]   Established Patient Office Visit  Subjective:  Patient ID: [name], male    DOB: [birth year]  Age: 65 y.o. MRN: [id number]  CC:  Chief Complaint  Patient presents with  . Follow-up    Follow up ,3 month follow up htn     HPI [name] presents for follow up. He  has a past medical history of Anginal pain ([place]), CAD (coronary artery disease), Cocaine abuse ([place]), COPD (chronic obstructive pulmonary disease) ([place]), GERD (gastroesophageal reflux disease), H/O: eczema, H/O: GI bleed, High cholesterol, Hypertension, Myocardial infarction [facility]), Perforation of duodenal ulcer ([place]), Shortness of breath dyspnea, Sleep disturbance (02/19/2020), and Tobacco abuse.   Hypertension Patient is here for follow-up of elevated blood pressure. He is exercising; ;walking and is adherent to a low-salt diet. Blood pressure is well controlled at home. Cardiac symptoms: dyspnea. Patient denies chest pain, chest pressure/discomfort, claudication, dyspnea, irregular heart beat, lower extremity edema, near-syncope, orthopnea, palpitations, paroxysmal nocturnal dyspnea, syncope and tachypnea. Cardiovascular risk factors: none. Use of agents associated with hypertension: none. History of target organ damage: angina/ prior myocardial infarction and stroke.  Past Medical History:  Diagnosis Date  . Anginal pain ([place])   . CAD (coronary artery disease)    4V CABG 10/31/13  . Cocaine abuse ([place])   . COPD (chronic obstructive pulmonary disease) ([place])   . GERD (gastroesophageal reflux disease)   . H/O: eczema    lower extremities  . H/O: GI bleed    a. 65 y/o ago, r/t ulcer. Denies recent GIB.  Marland Kitchen High cholesterol   . Hypertension   . Myocardial infarction ([place])   . Perforation of duodenal ulcer ([place])    a. 1970s - s/p surgery.   . Shortness of breath dyspnea   . Sleep disturbance  02/19/2020  . Tobacco abuse     Past Surgical History:  Procedure Laterality Date  . BIOPSY  11/05/2017   Procedure: BIOPSY;  Surgeon: Wilford Corner, MD;  Location: [place];  Service: Endoscopy;;  . COLONOSCOPY WITH PROPOFOL N/A 07/04/2019   Procedure: COLONOSCOPY WITH PROPOFOL;  Surgeon: Milus Banister, MD;  [facility];  Service: Endoscopy;  Laterality: N/A;  . CORONARY ARTERY BYPASS GRAFT N/A 10/31/2013   Procedure: CORONARY ARTERY BYPASS GRAFTING TIMES FOUR ON PUMP USING LEFT INTERNAL MAMMARY ARTERY AND RIGHT GREATER SAPHENOUS VEIN VIA ENDOVEIN HARVEST.;  Surgeon: Melrose Nakayama, MD;  Location: [facility] [facility];  Service: Open Heart Surgery;  Laterality: N/A;  . ENDARTERECTOMY Left 11/06/2017   Procedure: ENDARTERECTOMY CAROTID;  Surgeon: Conrad Winslow, MD;  Location: [facility];  Service: Vascular;  Laterality: Left;  . ESOPHAGOGASTRODUODENOSCOPY (EGD) WITH PROPOFOL N/A 11/05/2017   Procedure: ESOPHAGOGASTRODUODENOSCOPY (EGD) WITH PROPOFOL;  Surgeon: Wilford Corner, MD;  Location: [facility];  Service: Endoscopy;  Laterality: N/A;  . ESOPHAGOGASTRODUODENOSCOPY (EGD) WITH PROPOFOL N/A 07/04/2019   Procedure: ESOPHAGOGASTRODUODENOSCOPY (EGD) WITH PROPOFOL;  Surgeon: Milus Banister, MD;  [facility];  Service: Endoscopy;  Laterality: N/A;  . INTRAOPERATIVE TRANSESOPHAGEAL ECHOCARDIOGRAM N/A 10/31/2013   Procedure: INTRAOPERATIVE TRANSESOPHAGEAL ECHOCARDIOGRAM;  Surgeon: Melrose Nakayama, MD;  Location: [place];  Service: Open Heart Surgery;  Laterality: N/A;  . LEFT HEART CATHETERIZATION WITH CORONARY ANGIOGRAM N/A 10/28/2013   Procedure: LEFT HEART CATHETERIZATION WITH CORONARY ANGIOGRAM;  Surgeon: Burnell Blanks, MD;  Location: [facility] CATH LAB;  Service: Cardiovascular;  Laterality:  N/A;  . POLYPECTOMY  07/04/2019   Procedure: POLYPECTOMY;  Surgeon: Milus Banister, MD;  [facility];  Service: Endoscopy;;  . stomach sx      Family History  Problem  Relation Age of Onset  . Lung cancer Mother   . Alzheimer's disease Mother   . Lung cancer Father   . HIV/AIDS Brother   . Obesity Brother   . Lung cancer Other     Social History   Socioeconomic History  . Marital status: Single    Spouse name: Not on file  . Number of children: 1  . Years of education: Not on file  . Highest education level: Bachelor's degree (e.g., BA, AB, BS)  Occupational History  . Occupation: retired  Tobacco Use  . Smoking status: Current Every Day Smoker    Packs/day: 0.25    Years: 40.00    Pack years: 10.00    Types: Cigarettes  . Smokeless tobacco: Never Used  . Tobacco comment: smoking 5-6/day  Vaping Use  . Vaping Use: Never used  Substance and Sexual Activity  . Alcohol use: Yes    Alcohol/week: 4.0 standard drinks    Types: 4 Standard drinks or equivalent per week    Comment: vodka  . Drug use: Yes    Types: Marijuana, Cocaine    Comment: "about 1 marijuana cigarette a week"  . Sexual activity: Yes    Birth control/protection: None  Other Topics Concern  . Not on file  Social History Narrative   Pt is right-handed. He lives alone in a one story house. He drinks 2 cups coffee a day, and a liter of ginger-ale. He walks 4 x a week.   Social Determinants of Health   Financial Resource Strain: Not on file  Food Insecurity: Not on file  Transportation Needs: Not on file  Physical Activity: Not on file  Stress: Not on file  Social Connections: Not on file  Intimate Partner Violence: Not on file    Outpatient Medications Prior to Visit  Medication Sig Dispense Refill  . albuterol (PROVENTIL) (2.5 MG/3ML) 0.083% nebulizer solution TAKE 3 MLS (2.5 MG TOTAL) BY NEBULIZATION EVERY 6 (SIX) HOURS AS NEEDED FOR WHEEZING OR SHORTNESS OF BREATH. 150 mL 0  . albuterol (VENTOLIN HFA) 108 (90 Base) MCG/ACT inhaler INHALE 2 PUFFS INTO THE LUNGS EVERY 6 (SIX) HOURS AS NEEDED FOR WHEEZING OR SHORTNESS OF BREATH. 8.5 g 3  . atorvastatin (LIPITOR) 80  MG tablet TAKE 1 TABLET (80 MG TOTAL) BY MOUTH DAILY AT 6 PM. (Patient taking differently: Take by mouth daily. at 6pm) 90 tablet 3  . buPROPion (WELLBUTRIN SR) 150 MG 12 hr tablet TAKE 1 TABLET (150 MG TOTAL) BY MOUTH 2 (TWO) TIMES DAILY. 60 tablet 11  . clopidogrel (PLAVIX) 75 MG tablet Take 1 tablet (75 mg total) by mouth daily. 90 tablet 3  . ferrous fumarate (HEMOCYTE - 106 MG FE) 325 (106 Fe) MG TABS tablet Take 1 tablet (106 mg of iron total) by mouth daily. 30 tablet 0  . fluticasone (FLONASE) 50 MCG/ACT nasal spray PLACE 2 SPRAYS INTO BOTH NOSTRILS AS NEEDED FOR ALLERGIES OR RHINITIS. 16 g 2  . hydrochlorothiazide (MICROZIDE) 12.5 MG capsule TAKE 1 CAPSULE (12.5 MG TOTAL) BY MOUTH DAILY. 30 capsule 1  . montelukast (SINGULAIR) 10 MG tablet TAKE 1 TABLET (10 MG TOTAL) BY MOUTH AT BEDTIME. 90 tablet 3  . Multiple Vitamin (MULTIVITAMIN WITH MINERALS) TABS tablet Take 1 tablet by mouth daily. [place]  tablet 1  . pantoprazole (PROTONIX) 40 MG tablet TAKE 1 TABLET (40 MG TOTAL) BY MOUTH DAILY. 30 tablet 11  . Tiotropium Bromide-Olodaterol 2.5-2.5 MCG/ACT AERS INHALE 2 PUFFS INTO THE LUNGS DAILY. 4 g 5  . triamcinolone (KENALOG) 0.025 % ointment APPLY 1 APPLICATION TOPICALLY 2 TIMES DAILY. 30 g 2  . vitamin B-12 (CYANOCOBALAMIN) 500 MCG tablet Take 1 tablet (500 mcg total) by mouth daily. 30 tablet 0  . albuterol (VENTOLIN HFA) 108 (90 Base) MCG/ACT inhaler INHALE 1-2 PUFFS INTO THE LUNGS EVERY 4 (FOUR) HOURS AS NEEDED FOR WHEEZING OR SHORTNESS OF BREATH. 8.5 g 11  . clopidogrel (PLAVIX) 75 MG tablet TAKE 1 TABLET (75 MG TOTAL) BY MOUTH DAILY. 30 tablet 11   No facility-administered medications prior to visit.    No Known Allergies  ROS Review of Systems    Objective:    Physical Exam HENT:     Head: Normocephalic and atraumatic.  Cardiovascular:     Rate and Rhythm: Normal rate and regular rhythm.     Pulses: Normal pulses.     Heart sounds: Normal heart sounds.  Pulmonary:      Effort: Pulmonary effort is normal.     Breath sounds: Normal breath sounds.  Abdominal:     Palpations: Abdomen is soft.  Musculoskeletal:        General: Normal range of motion.     Cervical back: Normal range of motion.  Skin:    General: Skin is warm and dry.     Capillary Refill: Capillary refill takes less than 2 seconds.  Neurological:     General: No focal deficit present.     Mental Status: He is alert and oriented to person, place, and time.  Psychiatric:        Mood and Affect: Mood normal.        Behavior: Behavior normal.        Thought Content: Thought content normal.        Judgment: Judgment normal.     BP 123/70 (BP Location: Left Arm, Patient Position: Sitting, Cuff Size: Normal)   Temp 98.4 F (36.9 C) (Temporal)   Ht 6\' 1"  (1.854 m)   Wt 191 lb (86.6 kg)   SpO2 95%   BMI 25.20 kg/m  Wt Readings from Last 3 Encounters:  10/14/20 191 lb (86.6 kg)  07/16/20 191 lb (86.6 kg)  06/22/20 193 lb 9.6 oz (87.8 kg)     There are no preventive care reminders to display for this patient.  There are no preventive care reminders to display for this patient.  Lab Results  Component Value Date   TSH 0.478 11/05/2017   Lab Results  Component Value Date   WBC 8.2 04/09/2020   HGB 15.1 04/09/2020   HCT 45.1 04/09/2020   MCV 91 04/09/2020   PLT 327 04/09/2020   Lab Results  Component Value Date   NA 145 (H) 07/16/2020   K 4.7 07/16/2020   CO2 23 01/08/2020   GLUCOSE 102 (H) 07/16/2020   BUN 14 07/16/2020   CREATININE 1.31 (H) 07/16/2020   BILITOT 0.5 07/16/2020   ALKPHOS 148 (H) 07/16/2020   AST 25 07/16/2020   ALT 22 02/21/2019   PROT 6.8 07/16/2020   ALBUMIN 4.4 07/16/2020   CALCIUM 9.8 07/16/2020   ANIONGAP 9 01/08/2020   GFR 82.90 04/30/2019   Lab Results  Component Value Date   CHOL 133 01/08/2020   Lab Results  Component Value Date  HDL 63 01/08/2020   Lab Results  Component Value Date   LDLCALC 57 01/08/2020   Lab Results   Component Value Date   TRIG 61 01/08/2020   Lab Results  Component Value Date   CHOLHDL 2.1 01/08/2020   Lab Results  Component Value Date   HGBA1C 5.4 04/09/2020   HGBA1C 5.4 04/09/2020   HGBA1C 5.4 (A) 04/09/2020   HGBA1C 5.4 04/09/2020      Assessment & Plan:   Problem List Items Addressed This Visit      Cardiovascular and Mediastinum   Essential hypertension   HTN (hypertension) - Primary Encouraged on going compliance with current medication regimen Encouraged home monitoring and recording BP <130/80 Eating a heart-healthy diet with less salt Encouraged regular physical activity    Relevant Orders   POCT Urinalysis Dipstick (Completed)     Respiratory   Chronic obstructive pulmonary disease (HCC) Stable       Other   Protein-calorie malnutrition, severe (HCC) Stable eating and taking supplments    Other Visit Diagnoses    Pre-diabetes     Consider home glucose monitoring Weight loss at least 5% of current body weight is can be achieved with lifestyle modification dietary changes and regular daily exercise Encourage blood pressure control goal <120/80 and maintaining total cholesterol <200 Follow-up every 3 to 6 months for reevaluation Education material provided    Anemia, unspecified type Stable continued on iron supplements Labs next visit          No orders of the defined types were placed in this encounter.   Follow-up: Return in about 3 months (around 01/14/2021).    Vevelyn Francois, NP

## 2020-10-14 NOTE — Patient Instructions (Signed)
Managing Your Hypertension Hypertension, also called high blood pressure, is when the force of the blood pressing against the walls of the arteries is too strong. Arteries are blood vessels that carry blood from your heart throughout your body. Hypertension forces the heart to work harder to pump blood and may cause the arteries to become narrow or stiff. Understanding blood pressure readings Your personal target blood pressure may vary depending on your medical conditions, your age, and other factors. A blood pressure reading includes a higher number over a lower number. Ideally, your blood pressure should be below 120/80. You should know that:  The first, or top, number is called the systolic pressure. It is a measure of the pressure in your arteries as your heart beats.  The second, or bottom number, is called the diastolic pressure. It is a measure of the pressure in your arteries as the heart relaxes. Blood pressure is classified into four stages. Based on your blood pressure reading, your health care provider may use the following stages to determine what type of treatment you need, if any. Systolic pressure and diastolic pressure are measured in a unit called mmHg. Normal  Systolic pressure: below 120.  Diastolic pressure: below 80. Elevated  Systolic pressure: 120-129.  Diastolic pressure: below 80. Hypertension stage 1  Systolic pressure: 130-139.  Diastolic pressure: 80-89. Hypertension stage 2  Systolic pressure: 140 or above.  Diastolic pressure: 90 or above. How can this condition affect me? Managing your hypertension is an important responsibility. Over time, hypertension can damage the arteries and decrease blood flow to important parts of the body, including the brain, heart, and kidneys. Having untreated or uncontrolled hypertension can lead to:  A heart attack.  A stroke.  A weakened blood vessel (aneurysm).  Heart failure.  Kidney damage.  Eye  damage.  Metabolic syndrome.  Memory and concentration problems.  Vascular dementia. What actions can I take to manage this condition? Hypertension can be managed by making lifestyle changes and possibly by taking medicines. Your health care provider will help you make a plan to bring your blood pressure within a normal range. Nutrition  Eat a diet that is high in fiber and potassium, and low in salt (sodium), added sugar, and fat. An example eating plan is called the Dietary Approaches to Stop Hypertension (DASH) diet. To eat this way: ? Eat plenty of fresh fruits and vegetables. Try to fill one-half of your plate at each meal with fruits and vegetables. ? Eat whole grains, such as whole-wheat pasta, brown rice, or whole-grain bread. Fill about one-fourth of your plate with whole grains. ? Eat low-fat dairy products. ? Avoid fatty cuts of meat, processed or cured meats, and poultry with skin. Fill about one-fourth of your plate with lean proteins such as fish, chicken without skin, beans, eggs, and tofu. ? Avoid pre-made and processed foods. These tend to be higher in sodium, added sugar, and fat.  Reduce your daily sodium intake. Most people with hypertension should eat less than 1,500 mg of sodium a day.   Lifestyle  Work with your health care provider to maintain a healthy body weight or to lose weight. Ask what an ideal weight is for you.  Get at least 30 minutes of exercise that causes your heart to beat faster (aerobic exercise) most days of the week. Activities may include walking, swimming, or biking.  Include exercise to strengthen your muscles (resistance exercise), such as weight lifting, as part of your weekly exercise routine. Try   to do these types of exercises for 30 minutes at least 3 days a week.  Do not use any products that contain nicotine or tobacco, such as cigarettes, e-cigarettes, and chewing tobacco. If you need help quitting, ask your health care  provider.  Control any long-term (chronic) conditions you have, such as high cholesterol or diabetes.  Identify your sources of stress and find ways to manage stress. This may include meditation, deep breathing, or making time for fun activities.   Alcohol use  Do not drink alcohol if: ? Your health care provider tells you not to drink. ? You are pregnant, may be pregnant, or are planning to become pregnant.  If you drink alcohol: ? Limit how much you use to:  0-1 drink a day for women.  0-2 drinks a day for men. ? Be aware of how much alcohol is in your drink. In the [place] one drink equals one 12 oz bottle of beer (355 mL), one 5 oz glass of wine (148 mL), or one 1 oz glass of hard liquor (44 mL). Medicines Your health care provider may prescribe medicine if lifestyle changes are not enough to get your blood pressure under control and if:  Your systolic blood pressure is 130 or higher.  Your diastolic blood pressure is 80 or higher. Take medicines only as told by your health care provider. Follow the directions carefully. Blood pressure medicines must be taken as told by your health care provider. The medicine does not work as well when you skip doses. Skipping doses also puts you at risk for problems. Monitoring Before you monitor your blood pressure:  Do not smoke, drink caffeinated beverages, or exercise within 30 minutes before taking a measurement.  Use the bathroom and empty your bladder (urinate).  Sit quietly for at least 5 minutes before taking measurements. Monitor your blood pressure at home as told by your health care provider. To do this:  Sit with your back straight and supported.  Place your feet flat on the floor. Do not cross your legs.  Support your arm on a flat surface, such as a table. Make sure your upper arm is at heart level.  Each time you measure, take two or three readings one minute apart and record the results. You may also need to have your  blood pressure checked regularly by your health care provider.   General information  Talk with your health care provider about your diet, exercise habits, and other lifestyle factors that may be contributing to hypertension.  Review all the medicines you take with your health care provider because there may be side effects or interactions.  Keep all visits as told by your health care provider. Your health care provider can help you create and adjust your plan for managing your high blood pressure. Where to find more information  National Heart, Lung, and Blood Institute: www.nhlbi.nih.gov  American Heart Association: www.heart.org Contact a health care provider if:  You think you are having a reaction to medicines you have taken.  You have repeated (recurrent) headaches.  You feel dizzy.  You have swelling in your ankles.  You have trouble with your vision. Get help right away if:  You develop a severe headache or confusion.  You have unusual weakness or numbness, or you feel faint.  You have severe pain in your chest or abdomen.  You vomit repeatedly.  You have trouble breathing. These symptoms may represent a serious problem that is an emergency. Do not wait   to see if the symptoms will go away. Get medical help right away. Call your local emergency services (911 in the [place] Do not drive yourself to the hospital. Summary  Hypertension is when the force of blood pumping through your arteries is too strong. If this condition is not controlled, it may put you at risk for serious complications.  Your personal target blood pressure may vary depending on your medical conditions, your age, and other factors. For most people, a normal blood pressure is less than 120/80.  Hypertension is managed by lifestyle changes, medicines, or both.  Lifestyle changes to help manage hypertension include losing weight, eating a healthy, low-sodium diet, exercising more, stopping smoking, and  limiting alcohol. This information is not intended to replace advice given to you by your health care provider. Make sure you discuss any questions you have with your health care provider. Document Revised: 07/05/2019 Document Reviewed: 04/30/2019 Elsevier Patient Education  2021 [place]. Prediabetes Eating Plan Prediabetes is a condition that causes blood sugar (glucose) levels to be higher than normal. This increases the risk for developing type 2 diabetes (type 2 diabetes mellitus). Working with a health care provider or nutrition specialist (dietitian) to make diet and lifestyle changes can help prevent the onset of diabetes. These changes may help you:  Control your blood glucose levels.  Improve your cholesterol levels.  Manage your blood pressure. What are tips for following this plan? Reading food labels  Read food labels to check the amount of fat, salt (sodium), and sugar in prepackaged foods. Avoid foods that have: ? Saturated fats. ? Trans fats. ? Added sugars.  Avoid foods that have more than 300 milligrams (mg) of sodium per serving. Limit your sodium intake to less than 2,300 mg each day. Shopping  Avoid buying pre-made and processed foods.  Avoid buying drinks with added sugar. Cooking  Cook with olive oil. Do not use butter, lard, or ghee.  Bake, broil, grill, steam, or boil foods. Avoid frying. Meal planning  Work with your dietitian to create an eating plan that is right for you. This may include tracking how many calories you take in each day. Use a food diary, notebook, or mobile application to track what you eat at each meal.  Consider following a Mediterranean diet. This includes: ? Eating several servings of fresh fruits and vegetables each day. ? Eating fish at least twice a week. ? Eating one serving each day of whole grains, beans, nuts, and seeds. ? Using olive oil instead of other fats. ? Limiting alcohol. ? Limiting red meat. ? Using  nonfat or low-fat dairy products.  Consider following a plant-based diet. This includes dietary choices that focus on eating mostly vegetables and fruit, grains, beans, nuts, and seeds.  If you have high blood pressure, you may need to limit your sodium intake or follow a diet such as the DASH (Dietary Approaches to Stop Hypertension) eating plan. The DASH diet aims to lower high blood pressure.   Lifestyle  Set weight loss goals with help from your health care team. It is recommended that most people with prediabetes lose 7% of their body weight.  Exercise for at least 30 minutes 5 or more days a week.  Attend a support group or seek support from a mental health counselor.  Take over-the-counter and prescription medicines only as told by your health care provider. What foods are recommended? Fruits Berries. Bananas. Apples. Oranges. Grapes. Papaya. Mango. Pomegranate. Kiwi. Grapefruit. Cherries. Vegetables Lettuce. Spinach.  Peas. Beets. Cauliflower. Cabbage. Broccoli. Carrots. Tomatoes. Squash. Eggplant. Herbs. Peppers. Onions. Cucumbers. Brussels sprouts. Grains Whole grains, such as whole-wheat or whole-grain breads, crackers, cereals, and pasta. Unsweetened oatmeal. Bulgur. Barley. Quinoa. Brown rice. Corn or whole-wheat flour tortillas or taco shells. Meats and other proteins Seafood. Poultry without skin. Lean cuts of pork and beef. Tofu. Eggs. Nuts. Beans. Dairy Low-fat or fat-free dairy products, such as yogurt, cottage cheese, and cheese. Beverages Water. Tea. Coffee. Sugar-free or diet soda. Seltzer water. Low-fat or nonfat milk. Milk alternatives, such as soy or almond milk. Fats and oils Olive oil. Canola oil. Sunflower oil. Grapeseed oil. Avocado. Walnuts. Sweets and desserts Sugar-free or low-fat pudding. Sugar-free or low-fat ice cream and other frozen treats. Seasonings and condiments Herbs. Sodium-free spices. Mustard. Relish. Low-salt, low-sugar ketchup. Low-salt,  low-sugar barbecue sauce. Low-fat or fat-free mayonnaise. The items listed above may not be a complete list of recommended foods and beverages. Contact a dietitian for more information. What foods are not recommended? Fruits Fruits canned with syrup. Vegetables Canned vegetables. Frozen vegetables with butter or cream sauce. Grains Refined white flour and flour products, such as bread, pasta, snack foods, and cereals. Meats and other proteins Fatty cuts of meat. Poultry with skin. Breaded or fried meat. Processed meats. Dairy Full-fat yogurt, cheese, or milk. Beverages Sweetened drinks, such as iced tea and soda. Fats and oils Butter. Lard. Ghee. Sweets and desserts Baked goods, such as cake, cupcakes, pastries, cookies, and cheesecake. Seasonings and condiments Spice mixes with added salt. Ketchup. Barbecue sauce. Mayonnaise. The items listed above may not be a complete list of foods and beverages that are not recommended. Contact a dietitian for more information. Where to find more information  American Diabetes Association: www.diabetes.org Summary  You may need to make diet and lifestyle changes to help prevent the onset of diabetes. These changes can help you control blood sugar, improve cholesterol levels, and manage blood pressure.  Set weight loss goals with help from your health care team. It is recommended that most people with prediabetes lose 7% of their body weight.  Consider following a Mediterranean diet. This includes eating plenty of fresh fruits and vegetables, whole grains, beans, nuts, seeds, fish, and low-fat dairy, and using olive oil instead of other fats. This information is not intended to replace advice given to you by your health care provider. Make sure you discuss any questions you have with your health care provider. Document Revised: 08/29/2019 Document Reviewed: 08/29/2019 Elsevier Patient Education  [place].

## 2020-10-15 ENCOUNTER — Other Ambulatory Visit: Payer: Self-pay

## 2020-10-16 ENCOUNTER — Ambulatory Visit: Payer: Medicare HMO

## 2020-10-19 ENCOUNTER — Other Ambulatory Visit: Payer: Self-pay

## 2020-10-19 MED FILL — Bupropion HCl Tab ER 12HR 150 MG: ORAL | 30 days supply | Qty: 60 | Fill #1 | Status: CN

## 2020-10-19 MED FILL — Albuterol Sulfate Inhal Aero 108 MCG/ACT (90MCG Base Equiv): RESPIRATORY_TRACT | 25 days supply | Qty: 8.5 | Fill #0 | Status: CN

## 2020-10-19 MED FILL — Albuterol Sulfate Inhal Aero 108 MCG/ACT (90MCG Base Equiv): RESPIRATORY_TRACT | 25 days supply | Qty: 18 | Fill #0 | Status: CN

## 2020-10-19 MED FILL — Albuterol Sulfate Inhal Aero 108 MCG/ACT (90MCG Base Equiv): RESPIRATORY_TRACT | 25 days supply | Qty: 8.5 | Fill #0 | Status: AC

## 2020-10-20 ENCOUNTER — Other Ambulatory Visit: Payer: Self-pay

## 2021-01-14 ENCOUNTER — Ambulatory Visit: Payer: Medicare HMO | Admitting: Nurse Practitioner

## 2021-02-21 ENCOUNTER — Telehealth: Payer: Self-pay

## 2021-02-21 NOTE — Telephone Encounter (Signed)
02/21/2021 Name: [name] MRN: [id number] DOB: 1956-05-25  Unsuccessful outbound call made today to assist with:   scheduling Medicare AWV  Outreach Attempt:  1st Attempt  Patient's voicemail not set up. Will route to PCP to assist with contact patient and scheduling.  Eugenia Mcalpine

## 2021-03-04 ENCOUNTER — Telehealth: Payer: Self-pay

## 2021-03-04 NOTE — Telephone Encounter (Signed)
2nd attempt to reach patient regarding Medicare Wellness Exam. No answer and voice mail is not set up.

## 2021-07-09 ENCOUNTER — Telehealth: Payer: Self-pay

## 2021-07-09 NOTE — Telephone Encounter (Signed)
Attempted to contact for Medicare annual wellness visit. No voicemail setup, unable to reach.
